# Patient Record
Sex: Female | Born: 1970 | Race: White | Hispanic: No | Marital: Married | Smoking: Never smoker
Health system: Southern US, Community
[De-identification: ages and names within clinical notes are randomized; demographics above are authoritative.]

## PROBLEM LIST (undated history)

## (undated) DIAGNOSIS — Z9221 Personal history of antineoplastic chemotherapy: Secondary | ICD-10-CM

## (undated) DIAGNOSIS — J45909 Unspecified asthma, uncomplicated: Secondary | ICD-10-CM

## (undated) DIAGNOSIS — Z803 Family history of malignant neoplasm of breast: Secondary | ICD-10-CM

## (undated) DIAGNOSIS — Z801 Family history of malignant neoplasm of trachea, bronchus and lung: Secondary | ICD-10-CM

## (undated) DIAGNOSIS — G620 Drug-induced polyneuropathy: Secondary | ICD-10-CM

## (undated) DIAGNOSIS — T451X5A Adverse effect of antineoplastic and immunosuppressive drugs, initial encounter: Secondary | ICD-10-CM

## (undated) DIAGNOSIS — Z923 Personal history of irradiation: Secondary | ICD-10-CM

## (undated) DIAGNOSIS — Z8719 Personal history of other diseases of the digestive system: Secondary | ICD-10-CM

## (undated) HISTORY — PX: BREAST BIOPSY: SHX20

## (undated) HISTORY — PX: WISDOM TOOTH EXTRACTION: SHX21

## (undated) HISTORY — DX: Family history of malignant neoplasm of trachea, bronchus and lung: Z80.1

## (undated) HISTORY — DX: Family history of malignant neoplasm of breast: Z80.3

## (undated) HISTORY — PX: NASAL SINUS SURGERY: SHX719

---

## 1998-05-03 ENCOUNTER — Other Ambulatory Visit: Admission: RE | Admit: 1998-05-03 | Discharge: 1998-05-03 | Payer: Self-pay | Admitting: Obstetrics & Gynecology

## 1998-12-08 ENCOUNTER — Inpatient Hospital Stay (HOSPITAL_COMMUNITY): Admission: AD | Admit: 1998-12-08 | Discharge: 1998-12-10 | Payer: Self-pay | Admitting: Obstetrics & Gynecology

## 1999-01-31 ENCOUNTER — Other Ambulatory Visit: Admission: RE | Admit: 1999-01-31 | Discharge: 1999-01-31 | Payer: Self-pay | Admitting: Obstetrics & Gynecology

## 2000-02-27 ENCOUNTER — Other Ambulatory Visit: Admission: RE | Admit: 2000-02-27 | Discharge: 2000-02-27 | Payer: Self-pay | Admitting: Obstetrics & Gynecology

## 2001-05-19 ENCOUNTER — Other Ambulatory Visit: Admission: RE | Admit: 2001-05-19 | Discharge: 2001-05-19 | Payer: Self-pay | Admitting: Obstetrics & Gynecology

## 2002-07-14 ENCOUNTER — Other Ambulatory Visit: Admission: RE | Admit: 2002-07-14 | Discharge: 2002-07-14 | Payer: Self-pay | Admitting: Obstetrics & Gynecology

## 2003-05-24 ENCOUNTER — Emergency Department (HOSPITAL_COMMUNITY): Admission: AD | Admit: 2003-05-24 | Discharge: 2003-05-24 | Payer: Self-pay | Admitting: Internal Medicine

## 2003-12-20 ENCOUNTER — Other Ambulatory Visit: Admission: RE | Admit: 2003-12-20 | Discharge: 2003-12-20 | Payer: Self-pay | Admitting: Obstetrics & Gynecology

## 2004-06-13 ENCOUNTER — Encounter: Admission: RE | Admit: 2004-06-13 | Discharge: 2004-06-13 | Payer: Self-pay | Admitting: Internal Medicine

## 2005-01-16 ENCOUNTER — Ambulatory Visit: Payer: Self-pay | Admitting: Internal Medicine

## 2005-02-19 ENCOUNTER — Other Ambulatory Visit: Admission: RE | Admit: 2005-02-19 | Discharge: 2005-02-19 | Payer: Self-pay | Admitting: Obstetrics & Gynecology

## 2005-03-06 ENCOUNTER — Ambulatory Visit: Payer: Self-pay | Admitting: Internal Medicine

## 2005-04-17 ENCOUNTER — Ambulatory Visit: Payer: Self-pay | Admitting: Internal Medicine

## 2005-04-24 ENCOUNTER — Ambulatory Visit: Payer: Self-pay | Admitting: Internal Medicine

## 2005-11-24 ENCOUNTER — Ambulatory Visit: Payer: Self-pay | Admitting: Internal Medicine

## 2005-11-26 ENCOUNTER — Ambulatory Visit: Payer: Self-pay | Admitting: Internal Medicine

## 2006-04-01 ENCOUNTER — Ambulatory Visit: Payer: Self-pay | Admitting: Internal Medicine

## 2006-04-28 ENCOUNTER — Ambulatory Visit: Payer: Self-pay | Admitting: Internal Medicine

## 2006-05-06 ENCOUNTER — Ambulatory Visit: Payer: Self-pay | Admitting: Internal Medicine

## 2006-06-23 ENCOUNTER — Ambulatory Visit: Payer: Self-pay | Admitting: Internal Medicine

## 2006-11-04 ENCOUNTER — Ambulatory Visit: Payer: Self-pay | Admitting: Internal Medicine

## 2007-01-04 ENCOUNTER — Ambulatory Visit: Payer: Self-pay | Admitting: Internal Medicine

## 2007-04-22 ENCOUNTER — Encounter: Admission: RE | Admit: 2007-04-22 | Discharge: 2007-04-22 | Payer: Self-pay | Admitting: Obstetrics & Gynecology

## 2007-05-03 ENCOUNTER — Ambulatory Visit: Payer: Self-pay | Admitting: Internal Medicine

## 2007-05-06 ENCOUNTER — Ambulatory Visit: Payer: Self-pay | Admitting: Internal Medicine

## 2007-05-19 ENCOUNTER — Ambulatory Visit: Payer: Self-pay | Admitting: Internal Medicine

## 2007-07-14 HISTORY — PX: NASAL SINUS SURGERY: SHX719

## 2007-09-13 ENCOUNTER — Telehealth (INDEPENDENT_AMBULATORY_CARE_PROVIDER_SITE_OTHER): Payer: Self-pay | Admitting: *Deleted

## 2007-09-22 DIAGNOSIS — J45909 Unspecified asthma, uncomplicated: Secondary | ICD-10-CM | POA: Insufficient documentation

## 2007-09-22 DIAGNOSIS — J330 Polyp of nasal cavity: Secondary | ICD-10-CM | POA: Insufficient documentation

## 2007-09-22 DIAGNOSIS — Z91040 Latex allergy status: Secondary | ICD-10-CM | POA: Insufficient documentation

## 2007-09-22 DIAGNOSIS — J309 Allergic rhinitis, unspecified: Secondary | ICD-10-CM | POA: Insufficient documentation

## 2007-11-03 ENCOUNTER — Ambulatory Visit: Payer: Self-pay | Admitting: Internal Medicine

## 2007-11-18 ENCOUNTER — Ambulatory Visit: Payer: Self-pay | Admitting: Internal Medicine

## 2008-01-09 ENCOUNTER — Telehealth (INDEPENDENT_AMBULATORY_CARE_PROVIDER_SITE_OTHER): Payer: Self-pay | Admitting: *Deleted

## 2008-05-03 ENCOUNTER — Ambulatory Visit: Payer: Self-pay | Admitting: Internal Medicine

## 2008-09-12 ENCOUNTER — Ambulatory Visit: Payer: Self-pay | Admitting: Internal Medicine

## 2008-11-09 ENCOUNTER — Ambulatory Visit: Payer: Self-pay | Admitting: Otolaryngology

## 2008-11-15 ENCOUNTER — Ambulatory Visit: Payer: Self-pay | Admitting: Otolaryngology

## 2010-04-17 ENCOUNTER — Encounter: Admission: RE | Admit: 2010-04-17 | Discharge: 2010-04-17 | Payer: Self-pay | Admitting: Obstetrics & Gynecology

## 2010-08-03 ENCOUNTER — Encounter: Payer: Self-pay | Admitting: Obstetrics & Gynecology

## 2010-08-14 NOTE — Progress Notes (Signed)
Summary: [facility]MT[facility]-pt of town need med  Phone Note Call from Patient Call back at [phone]   Caller: Patient Call For: young Summary of Call: need something for sob need albuterol inhaler sent to pharmacy pharmacy [phone] Initial call taken by: Rickard Patience,  January 09, 2008 10:28 AM  Follow-up for Phone Call        Does CY rx albuterol relief inhaler for pt?  Not on pt's med list.  Attempted to call pt back.  LMTCB. Cloyde Reams RN  January 09, 2008 10:30 AM  Spoke with pt.  States CY has filled albuterol in the past.  Pt does not fill rx regularly, and albuterol inh pt has on hand has expires.  Pt is having trouble taking in a deep breath.  Is this OK to call in? Please advise. Follow-up by: Cloyde Reams RN,  January 09, 2008 11:19 AM  Additional Follow-up for Phone Call Additional follow up Details #1::        Added Proaitr to her med list. Please call it in as requested. Additional Follow-up by: Waymon Budge MD,  January 09, 2008 11:24 AM    Additional Follow-up for Phone Call Additional follow up Details #2::    PROAIR PHONED TO PHARMACY IN [place]-PH# [phone] Follow-up by: Philipp Deputy CMA,  January 09, 2008 12:03 PM  New/Updated Medications: PROAIR HFA 108 (90 BASE) MCG/ACT  AERS (ALBUTEROL SULFATE) 2 puffs four times a day prn   Prescriptions: PROAIR HFA 108 (90 BASE) MCG/ACT  AERS (ALBUTEROL SULFATE) 2 puffs four times a day prn  #1 x prn   Entered and Authorized by:   Waymon Budge MD   Signed by:   Waymon Budge MD on 01/09/2008   Method used:   Historical   RxID:   [id number]

## 2010-08-14 NOTE — Progress Notes (Signed)
Summary: rx req  Medications Added TAMIFLU 75 MG  CAPS (OSELTAMIVIR PHOSPHATE)  ZITHROMAX Z-PAK 250 MG  TABS (AZITHROMYCIN)        Phone Note Call from Patient   Caller: Patient Call For: young Summary of Call: req z pack for her sinus infection. son recently had the flu[facility] [address] Patient's chart has been requested. Initial call taken by: Tivis Ringer,  September 13, 2007 11:21 AM  Follow-up for Phone Call        head congestion, non-productive cough, body aches, headaches, ??? flu - son has flu; daughter just had sinus surgery Z-Pak & Tamiflu?? Follow-up by: Marinus Maw,  September 13, 2007 11:29 AM  Additional Follow-up for Phone Call Additional follow up Details #1::        OK to send  Z-pak, 2 today then one daily. Tamiflu 75 mg, # 10, 1 two times a day.  Additional Follow-up by: Waymon Budge MD,  September 13, 2007 11:55 AM    New/Updated Medications: TAMIFLU 75 MG  CAPS (OSELTAMIVIR PHOSPHATE)  ZITHROMAX Z-PAK 250 MG  TABS (AZITHROMYCIN)    Prescriptions: TAMIFLU 75 MG  CAPS (OSELTAMIVIR PHOSPHATE)   #10 x 0   Entered by:   Marinus Maw   Authorized by:   Waymon Budge MD   Signed by:   Marinus Maw on 09/13/2007   Method used:   [facility] to ...       [facility]  [address]  [id number]       [address]       [place], [place]  [address]       Ph: [phone] or [phone]       Fax: [phone]   RxID:   [id number] ZITHROMAX Z-PAK 250 MG  TABS (AZITHROMYCIN)   #1 x 0   Entered by:   Marinus Maw   Authorized by:   Waymon Budge MD   Signed by:   Marinus Maw on 09/13/2007   Method used:   Telephoned to ...       [facility]  [address]  [id number]       [address]       [place], [place]  [address]       Ph: [phone] or [phone]       Fax: [phone]   RxID:   [id number]

## 2010-08-14 NOTE — Assessment & Plan Note (Signed)
Summary: FU 6 MONTHS///KWP   Visit Type:  Follow-up PCP:  [name]  Chief Complaint:  6 month follow up.  History of Present Illness: Current Problems:  NASAL POLYP (ICD-471.0) PERSONAL HISTORY OF ALLERGY TO LATEX (ICD-V15.07) ASTHMA (ICD-493.90) ALLERGIC RHINITIS (ICD-477.9)  Chelsea Hobbs returns for follow-up for allergic rhinitis and asthma with history of nasal polyps.  She continues allergy vaccine giving her own injections with no problems.  We discussed risk and safety issues again.  She has an EpiPen.  Nasal congestion, comes and goes.  She doesn't have much nasal discharge.  She would like to try Allegra.  She only wheezes with virus infections.  Her 28 year old daughter is having nasal polyp surgery.       Current Allergies (reviewed today): ! DOXYCYCLINE ! * LATEX  Past Medical History:    Reviewed history and no changes required:       NASAL POLYP (ICD-471.0)       PERSONAL HISTORY OF ALLERGY TO LATEX (ICD-V15.07)       ASTHMA (ICD-493.90)       ALLERGIC RHINITIS (ICD-477.9)            Risk Factors:  Tobacco use:  never   Review of Systems      See HPI   Vital Signs:  Patient Profile:   40 Years Old Female Weight:      117 pounds O2 Sat:      100 % O2 treatment:    Room Air Pulse rate:   84 / minute BP sitting:   114 / 74  (left arm) Cuff size:   regular  Vitals Entered By: Boone Master CNA (Nov 18, 2007 8:56 AM)             Is Patient Diabetic? No Comments Medications reviewed with patient Boone Master CNA  Nov 18, 2007 8:56 AM      Physical Exam  General:     is well-developed, well-nourished, and in no distress Eyes:     PERRLA/EOM intact; conjunctiva and sclera clear Ears:     TMs intact and clear with normal canals Nose:     nasal airway is narrow with without obstruction or visible polyps.  The mucosa is pale.  Secretions are minimal. Mouth:     no deformity or lesions Neck:     no masses, thyromegaly, or abnormal  cervical nodes Lungs:     clear bilaterally to auscultation and percussion Heart:     regular rate and rhythm, S1, S2 without murmurs, rubs, gallops, or clicks Cervical Nodes:     no significant adenopathy Axillary Nodes:     no significant adenopathy     Problem # 1:  ALLERGIC RHINITIS (ICD-477.9) she has had fairly good control of her allergic rhinitis.  Allergy vaccine does help.  We will let her try switching from Zyrtec to fexofenadine as an antihistamine The following medications were removed from the medication list:    Astelin 137 Mcg/spray Soln (Azelastine hcl) ..... Spray into both nostrils twice a  day  Her updated medication list for this problem includes:    Zyrtec Allergy 10 Mg Tabs (Cetirizine hcl) .Marland Kitchen... As needed    Fluticasone Propionate 50 Mcg/act Susp (Fluticasone propionate) .Marland Kitchen... 1-2 puffs each nostril daily    Fexofenadine Hcl 180 Mg Tabs (Fexofenadine hcl) .Marland Kitchen... 1 daily as needed for allergy  Orders: Est. Patient Level III ([address])   Problem # 2:  ASTHMA (ICD-493.90) Assessment: Unchanged  Orders: Est. Patient  Level III ([address])   Medications Added to Medication List This Visit: 1)  Allergy Vaccine  .Marland Kitchen.. 1:10 2)  Allergy Vaccine Go 1:10 (w-e)  3)  Nexium 40 Mg Pack (Esomeprazole magnesium) .... As needed 4)  Zyrtec Allergy 10 Mg Tabs (Cetirizine hcl) .... As needed 5)  Veramyst 27.5 Mcg/spray Susp (Fluticasone furoate) .Marland Kitchen.. 1 spray each nostril two times a day 6)  Fluticasone Propionate 50 Mcg/act Susp (Fluticasone propionate) .Marland Kitchen.. 1-2 puffs each nostril daily 7)  Epipen 0.3 Mg/0.26ml (1:1000) Devi (Epinephrine hcl (anaphylaxis)) .... For severe allergic reaction 8)  Fexofenadine Hcl 180 Mg Tabs (Fexofenadine hcl) .Marland Kitchen.. 1 daily as needed for allergy   Patient Instructions: 1)  Please schedule a follow-up appointment in 6 months. 2)  continue vaccine at 1:10 3)  Try fexofenadine as an antihistamine 4)  Consider adding Sudafed-PE (OTC)  as a  decongestant as needed   Prescriptions: FEXOFENADINE HCL 180 MG  TABS (FEXOFENADINE HCL) 1 daily as needed for allergy  #30 x prn   Entered and Authorized by:   Waymon Budge MD   Signed by:   Waymon Budge MD on 11/18/2007   Method used:   Electronically sent to ...       [facility]  [address]  [id number]       [address]       [place], [place]  [address]       Ph: [phone] or [phone]       Fax: [phone]   RxID:   [id number] FLUTICASONE PROPIONATE 50 MCG/ACT  SUSP (FLUTICASONE PROPIONATE) 1-2 puffs each nostril daily  #1 x prn   Entered and Authorized by:   Waymon Budge MD   Signed by:   Waymon Budge MD on 11/18/2007   Method used:   Historical   RxID:   [id number] EPIPEN 0.3 MG/0.3ML (1:1000)  DEVI (EPINEPHRINE HCL (ANAPHYLAXIS)) For severe allergic reaction  #1 x prn   Entered and Authorized by:   Waymon Budge MD   Signed by:   Waymon Budge MD on 11/18/2007   Method used:   Electronically sent to ...       [facility]  [address]  [id number]       [address]       [place], [place]  [address]       Ph: [phone] or [phone]       Fax: [phone]   RxID:   [id number]  ]

## 2010-11-25 NOTE — Assessment & Plan Note (Signed)
[facility]                             PULMONARY OFFICE NOTE   RANESHIA, DERICK                      MRN:          [id number]  DATE:05/19/2007                            DOB:          1971/07/07    PROBLEMS:  1. Allergic rhinitis.  2. Asthma.  3. Latex dermatitis.  4. Nasal polyp.   HISTORY:  He is doing well. There is no nasal congestion this fall. He  uses Zyrtec now and has discovered that using saline lavage with a neti  pot works great when needed. Allergy vaccine definitely helps and she  continues without problems. This was reviewed again. She has had flu  shot.   MEDICATIONS:  1. Allergy vaccine.  2. Astelin p.r.n.  3. Nexium p.r.n.  4. Lyalda 1.2 grams p.r.n.  5. Zyrtec.  6. Nasal fluticasone spray.   Drug intolerant of LATEX. DOXYCYCLINE caused diarrhea.   OBJECTIVE:  Weight 115 pounds, blood pressure 112/78, pulse regular 71,  room air saturation 98%. Conjunctivae are clear. There is a prominent  pale superior turbinate on the left or possibly a polyp, non-occlusive.  Her pharynx is clear. Lungs are clear. Heart sounds are normal.   IMPRESSION:  Allergic rhinitis with history of nasal polyps and  questionable polyp on the left.   PLAN:  She will continue fluticasone nasal spray which was discussed.  Continue vaccine. Schedule return 6 months, earlier p.r.n.     Clinton D. Maple Hudson, MD, [facility], FACP  Electronically Signed    CDY/MedQ  DD: 05/21/2007  DT: 05/22/2007  Job #: [id number]   cc:   Georgann Housekeeper, MD

## 2010-11-28 NOTE — Assessment & Plan Note (Signed)
[facility]                             PULMONARY OFFICE NOTE   AKSHITHA, CULMER                      MRN:          [id number]  DATE:11/04/2006                            DOB:          April 07, 1971    PROBLEMS:  1. Allergic rhinitis.  2. Asthma.  3. Latex dermatitis.  4. Nasal polyp.   HISTORY:  A six month followup.  Minor nasal stuffiness, which she is  willing to put up with as long as she can use Zyrtec.  She quit  Rhinocort mainly because it was too expensive for her.  She continues  allergy vaccine without problems and does have an EpiPen.  She has not  had wheezing.   MEDICATIONS:  1. Allergy vaccine.  2. Generic Allegra 60 mg, being changed to Zyrtec OTC.  3. Nexium.  4. Lialda 1.2 gm.   Drug-intolerant to DOXYCYCLINE with diarrhea.  Contact with LASIX.   OBJECTIVE:  VITAL SIGNS:  Weight 115 pounds.  BP 122/78, pulse regular  at 71.  Room air saturation 98%.  HEENT:  There is a polyp on the upper aspect of the left naris, but she  seems able to breathe comfortably through both nostrils.  Pharynx is  clear.  Conjunctivae are not injected.  LUNGS:  Clear.   IMPRESSION:  Nasal polyposis, allergic rhinitis, minimal asthma.   PLAN:  1. Fluticasone nasal spray to replace Rhinocort once each nostril      daily.  2. Continue allergy vaccine.  3. Schedule return in six months, earlier p.r.n.     Clinton D. Maple Hudson, MD, [facility], FACP  Electronically Signed    CDY/MedQ  DD: 11/04/2006  DT: 11/05/2006  Job #: [id number]   cc:   Georgann Housekeeper, MD

## 2010-11-28 NOTE — Assessment & Plan Note (Signed)
[facility]                               PULMONARY OFFICE NOTE   Chelsea, Hobbs                      MRN:          [id number]  DATE:05/06/2006                            DOB:          1970/09/01    PROBLEM LIST:  1. Allergic rhinitis.  2. Asthma.  3. Latex dermatitis.  4. Question nasal polyp.   HISTORY OF PRESENT ILLNESS:  She had seen the nurse practitioner on  September 20, complaining of some facial irritation with mild lip swelling  and tingling after beginning a new skin care product.  She had also started  on a new colitis medication called Lialda.  She stopped the colitis  medication for observation and restarted it with no recurrent problems.  It  is still not clear whether she was having a reaction to her cosmetics, but  those have been discontinued and the rash has not returned.  She still  notices some nasal congestion lingering after an obvious cold 2 weeks ago  and when she bends over, she feels uncomfortable, retro-orbital pressure,  not really a headache.  Her chest and ears are okay.   MEDICATIONS:  Allergy vaccine continues it 1/10 with no problems.  She has  had flu vaccine.  Allegra 60 mg b.i.d. p.r.n., Astelin p.r.n., Nexium,  Rhinocort, Lialda 1.2 mg.   OBJECTIVE:  VITAL SIGNS:  Weight 116 pounds, BP 124/70, pulse regular at 76  per minute, room air saturation 100%.  HEENT:  There is moderate nasal congestion, no periorbital edema, mild  turbinate edema is noted with only clear secretion.  No postnasal drainage  or pharyngeal erythema.  Tympanic membranes seem okay.  I do not find  adenopathy.  LUNGS:  Clear.   IMPRESSION:  Residual rhinitis with mucosal congestion, possible mild  sinusitis, otherwise stable.   PLAN:  1. Nasal nebulizer treatment with Neo-Synephrine.  2. Depo-Medrol 80 mg IM with steroid talk.  3. Entex PSE one b.i.d. p.r.n. congestion.  4. Continue allergy vaccine at 1/10.  5. Schedule  return in 6 months, earlier p.r.n.     Clinton D. Maple Hudson, MD, [facility], FACP   CDY/MedQ  DD: 05/08/2006  DT: 05/10/2006  Job #: [id number]   cc:   Georgann Housekeeper, MD

## 2010-11-28 NOTE — Assessment & Plan Note (Signed)
[facility]                               PULMONARY OFFICE NOTE   Chelsea Hobbs, SLAPPEY                      MRN:          [id number]  DATE:04/01/2006                            DOB:          08-12-1970    HISTORY OF PRESENT ILLNESS:  The patient is a very pleasant female patient  of Dr. Maple Hudson who has a known history of allergic rhinitis, asthma, and latex  dermatitis who presents for an acute office visit.  The patient complains  over the last 4 days she has had an irritation along her face with some mild  lip swelling and tingling.  The patient reports she recently started using a  new facial care product from [facility] and subsequently woke up the next  morning with irritation along the face with scaling of the skin and  puffiness.  The patient did discontinue this product and then subsequently  used another facial product from [facility] and had continued irritation.  She  changed to another [employer] skin product with persistent irritation.  NOTE:  The patient did also start on a new medication for her colitis called Lialda  1.2 gm daily.  The patient had previously been on this medication for an  extended amount of time, however, it was through suppositories and now she  is on the oral tablet form.  The patient does report she has a mild chest  tightness and wheezing for which she took her albuterol inhaler with total  resolution.  This morning the patient reports she has had no cough,  wheezing, difficulty swallowing, or fever.  She does complain of some slight  facial puffiness, especially around the lower lip.   PAST MEDICAL HISTORY:  Reviewed.   CURRENT MEDICATIONS:  Reviewed.   ON PHYSICAL EXAMINATION:  GENERAL:  The patient is a pleasant female in no  acute distress.  She is afebrile with stable vital signs.  HEENT:  Posterior pharynx is clear.  No exudate or swelling is noted.  TMs  are normal.  Conjunctivae noninjected.  Lips appear  somewhat puffy; however,  no significant swelling is noted.  The patient does have some dry skin along  the outer edge of the lower lip and chin area.  No rash or urticarial  reaction is noted.  NECK:  Supple without cervical adenopathy.  No JVD.  LUNG SOUNDS:  Clear without any wheezing or crackles.  CARDIAC:  Regular rate and rhythm.  ABDOMEN:  Soft and benign.  EXTREMITIES:  Warm without any calf cyanosis, clubbing, or edema.   IMPRESSION/PLAN:  Skin reaction, questionable etiology.  The patient does  appear to be intolerant of the new facial cream products and has been  recommended to discontinue these.  Unfortunately these were also started  around the same time her new medication Lialda, so I have instructed her to  stop this medication at least for the next week and contact her  gastroenterologist and let him know that she is stopping this medication in  case she is developing a reaction and to determine if in the next week we  should restart this once her skin has cleared up.  In the meantime, the  patient will increase her Allegra up to 180 mg for 3 days.  Stop her topical  cream products, use Dove soap, and Eucerin cream daily.  The patient was  given a Xopenex nebulizer treatment.  The patient is on allergy vaccines  1:50 once a week.  I have reminded the patient that she does also have an  EpiPen in case of reaction during her allergy vaccines.  The patient is to  return here in two weeks with Dr. Maple Hudson or sooner if needed.  The patient  has been advised if her symptoms persist, do not improve, or worsen, she is  to contact us immediately or go to the closest emergency department.                                   Rubye Oaks, NP                                Clinton D. Maple Hudson, MD, [facility], FACP   TP/MedQ  DD:  04/01/2006  DT:  04/03/2006  Job #:  [id number]

## 2011-08-13 ENCOUNTER — Other Ambulatory Visit: Payer: Self-pay | Admitting: Obstetrics & Gynecology

## 2011-08-13 DIAGNOSIS — Z1231 Encounter for screening mammogram for malignant neoplasm of breast: Secondary | ICD-10-CM

## 2011-08-28 ENCOUNTER — Ambulatory Visit: Payer: Self-pay

## 2011-09-11 ENCOUNTER — Ambulatory Visit
Admission: RE | Admit: 2011-09-11 | Discharge: 2011-09-11 | Disposition: A | Discharge status: Home / Self Care | Payer: BC Managed Care – PPO | Source: Ambulatory Visit | Attending: Obstetrics & Gynecology | Admitting: Obstetrics & Gynecology

## 2011-09-11 DIAGNOSIS — Z1231 Encounter for screening mammogram for malignant neoplasm of breast: Secondary | ICD-10-CM

## 2012-02-15 ENCOUNTER — Ambulatory Visit: Payer: Self-pay | Admitting: Otolaryngology

## 2012-05-09 ENCOUNTER — Emergency Department (HOSPITAL_COMMUNITY)
Admission: EM | Admit: 2012-05-09 | Discharge: 2012-05-09 | Disposition: A | Discharge status: Home / Self Care | Payer: BC Managed Care – PPO | Attending: Emergency Medicine | Admitting: Emergency Medicine

## 2012-05-09 ENCOUNTER — Emergency Department (HOSPITAL_COMMUNITY): Payer: BC Managed Care – PPO

## 2012-05-09 ENCOUNTER — Encounter (HOSPITAL_COMMUNITY): Payer: Self-pay | Admitting: Emergency Medicine

## 2012-05-09 DIAGNOSIS — N200 Calculus of kidney: Secondary | ICD-10-CM | POA: Insufficient documentation

## 2012-05-09 DIAGNOSIS — J45909 Unspecified asthma, uncomplicated: Secondary | ICD-10-CM | POA: Insufficient documentation

## 2012-05-09 DIAGNOSIS — Z79899 Other long term (current) drug therapy: Secondary | ICD-10-CM | POA: Insufficient documentation

## 2012-05-09 DIAGNOSIS — Z791 Long term (current) use of non-steroidal anti-inflammatories (NSAID): Secondary | ICD-10-CM | POA: Insufficient documentation

## 2012-05-09 HISTORY — DX: Unspecified asthma, uncomplicated: J45.909

## 2012-05-09 LAB — URINALYSIS, ROUTINE W REFLEX MICROSCOPIC
Bilirubin Urine: NEGATIVE
Glucose, UA: NEGATIVE mg/dL
Hgb urine dipstick: NEGATIVE
Ketones, ur: NEGATIVE mg/dL
Leukocytes, UA: NEGATIVE
Nitrite: NEGATIVE
Protein, ur: 30 mg/dL — AB
Specific Gravity, Urine: 1.024 (ref 1.005–1.030)
Urobilinogen, UA: 0.2 mg/dL (ref 0.0–1.0)
pH: 8.5 — ABNORMAL HIGH (ref 5.0–8.0)

## 2012-05-09 LAB — CBC
HCT: 35.5 % — ABNORMAL LOW (ref 36.0–46.0)
Hemoglobin: 12.5 g/dL (ref 12.0–15.0)
MCH: 31.4 pg (ref 26.0–34.0)
MCHC: 35.2 g/dL (ref 30.0–36.0)
MCV: 89.2 fL (ref 78.0–100.0)
Platelets: 322 10*3/uL (ref 150–400)
RBC: 3.98 MIL/uL (ref 3.87–5.11)
RDW: 12.2 % (ref 11.5–15.5)
WBC: 13.2 10*3/uL — ABNORMAL HIGH (ref 4.0–10.5)

## 2012-05-09 LAB — BASIC METABOLIC PANEL
BUN: 18 mg/dL (ref 6–23)
CO2: 26 mEq/L (ref 19–32)
Calcium: 9.1 mg/dL (ref 8.4–10.5)
Chloride: 102 mEq/L (ref 96–112)
Creatinine, Ser: 0.69 mg/dL (ref 0.50–1.10)
GFR calc Af Amer: >90 mL/min (ref >90)
GFR calc non Af Amer: >90 mL/min (ref >90)
Glucose, Bld: 168 mg/dL — ABNORMAL HIGH (ref 70–99)
Potassium: 3.5 mEq/L (ref 3.5–5.1)
Sodium: 138 mEq/L (ref 135–145)

## 2012-05-09 LAB — WET PREP, GENITAL
Trich, Wet Prep: NONE SEEN
WBC, Wet Prep HPF POC: NONE SEEN
Yeast Wet Prep HPF POC: NONE SEEN

## 2012-05-09 LAB — PREGNANCY, URINE: Preg Test, Ur: NEGATIVE

## 2012-05-09 LAB — URINE MICROSCOPIC-ADD ON

## 2012-05-09 MED ORDER — MORPHINE SULFATE 4 MG/ML IJ SOLN
4.0000 mg | Freq: Once | INTRAMUSCULAR | Status: AC
  Administered 2012-05-09: 4 mg via INTRAVENOUS
  Filled 2012-05-09: qty 1

## 2012-05-09 MED ORDER — OXYCODONE-ACETAMINOPHEN 5-325 MG PO TABS
1.0000 | ORAL_TABLET | Freq: Once | ORAL | Status: AC
  Administered 2012-05-09: 1 via ORAL
  Filled 2012-05-09: qty 1

## 2012-05-09 MED ORDER — ONDANSETRON HCL 4 MG/2ML IJ SOLN
4.0000 mg | Freq: Once | INTRAMUSCULAR | Status: AC
  Administered 2012-05-09: 4 mg via INTRAVENOUS

## 2012-05-09 MED ORDER — IOHEXOL 300 MG/ML  SOLN
20.0000 mL | INTRAMUSCULAR | Status: AC
  Administered 2012-05-09: 20 mL via ORAL

## 2012-05-09 MED ORDER — PERCOCET 5-325 MG PO TABS: 1.0000 | ORAL_TABLET | Freq: Four times a day (QID) | ORAL | Status: DC | PRN

## 2012-05-09 MED ORDER — ONDANSETRON 4 MG PO TBDP
8.0000 mg | ORAL_TABLET | Freq: Once | ORAL | Status: AC
  Administered 2012-05-09: 8 mg via ORAL
  Filled 2012-05-09: qty 2

## 2012-05-09 MED ORDER — ONDANSETRON HCL 4 MG/2ML IJ SOLN
INTRAMUSCULAR | Status: AC
  Administered 2012-05-09: 4 mg via INTRAVENOUS
  Filled 2012-05-09: qty 2

## 2012-05-09 MED ORDER — NAPROXEN 375 MG PO TABS: 375.0000 mg | ORAL_TABLET | Freq: Two times a day (BID) | ORAL | Status: DC

## 2012-05-09 NOTE — ED Notes (Signed)
Pt stated that she started having lower left side abd pain at 0730 this morning. Denies pain radiating to back or leg. Pt stated that she has an IUD.

## 2012-05-09 NOTE — ED Notes (Signed)
Patient transported to CT 

## 2012-05-09 NOTE — ED Notes (Signed)
Patient transported to Ultrasound 

## 2012-05-09 NOTE — ED Provider Notes (Signed)
Medical screening examination/treatment/procedure(s) were performed by non-physician practitioner and as supervising physician I was immediately available for consultation/collaboration.    Nelia Shi, MD 05/09/12 [phone]

## 2012-05-09 NOTE — ED Notes (Signed)
Pt ambulate to bath, steady gate noted.

## 2012-05-09 NOTE — ED Notes (Signed)
Pt up to the bathroom with assistance. When back to the room pt with active vomiting. Zofran ordered and given.

## 2012-05-09 NOTE — ED Provider Notes (Signed)
History     CSN: [id number]  Arrival date & time 05/09/12  1002   First MD Initiated Contact with Patient 05/09/12 1010      Chief Complaint  Patient presents with  . Abdominal Pain    (Consider location/radiation/quality/duration/timing/severity/associated sxs/prior treatment) HPI Comments: Patient is a 41 year old female with an IUD placed that presents emergency department with a chief complaint of left lower quadrant abdominal pain.  Onset of symptoms began at 730 this morning and was described as a pressure type sensation with urination.  Patient denied pain radiation, hematuria, dysuria, or urinary frequency.  Associated symptoms include nausea and lightheadedness from pain.  As the day has progressed the discomfort has worsened turning into pain with a severity of 10/10.  Patient denies any fevers, diaphoresis, chills, trauma, vaginal discharge, emesis, melena, hematochezia, change in BM or weight loss. LNBM yesterday. No known familial breast or ovarian cancer.  The history is provided by the patient.    Past Medical History  Diagnosis Date  . Asthma     Past Surgical History  Procedure Date  . Nasal sinus surgery     No family history on file.  History  Substance Use Topics  . Smoking status: Never Smoker   . Smokeless tobacco: Not on file  . Alcohol Use: Yes     occasionally     OB History    Grav Para Term Preterm Abortions TAB SAB Ect Mult Living                  Review of Systems  Allergies  Doxycycline and Latex  Home Medications   Current Outpatient Rx  Name Route Sig Dispense Refill  . ALBUTEROL SULFATE HFA 108 (90 BASE) MCG/ACT IN AERS Inhalation Inhale 2 puffs into the lungs every 6 (six) hours as needed. For shortness of breath    . VITAMIN-B COMPLEX PO TABS Oral Take 1 tablet by mouth daily.    Marland Kitchen FEXOFENADINE-PSEUDOEPHED ER 60-120 MG PO TB12 Oral Take 1 tablet by mouth daily as needed.    . IBUPROFEN 200 MG PO TABS Oral Take 400 mg by  mouth every 6 (six) hours as needed. For headaches    . ADULT MULTIVITAMIN W/MINERALS CH Oral Take 1 tablet by mouth daily.    Marland Kitchen FISH OIL PO Oral Take 1 tablet by mouth daily.      BP 111/70  Pulse 62  Temp 97 F (36.1 C)  Resp 14  SpO2 100%  Physical Exam  Nursing note and vitals reviewed. Constitutional: Vital signs are normal. She appears well-developed and well-nourished. No distress.  HENT:  Head: Normocephalic and atraumatic.  Mouth/Throat: Uvula is midline, oropharynx is clear and moist and mucous membranes are normal.  Eyes: Conjunctivae normal and EOM are normal. Pupils are equal, round, and reactive to light.  Neck: Normal range of motion and full passive range of motion without pain. Neck supple. No spinous process tenderness and no muscular tenderness present. No rigidity. No Brudzinski's sign noted.  Cardiovascular: Normal rate and regular rhythm.   Pulmonary/Chest: Effort normal and breath sounds normal. No accessory muscle usage. Not tachypneic. No respiratory distress.  Abdominal: Soft. Normal appearance. She exhibits no distension, no ascites, no pulsatile midline mass and no mass. There is tenderness. There is no CVA tenderness. No hernia.       LLQ abdominal pain & left CVA tenderness  Lymphadenopathy:    She has no cervical adenopathy.  Neurological: She is alert.  Skin: Skin  is warm and dry. No rash noted. She is not diaphoretic.  Psychiatric: She has a normal mood and affect. Her speech is normal and behavior is normal.  Exam performed by Jaci Carrel,  exam chaperoned Date: 05/09/2012 Pelvic exam: normal external genitalia without evidence of trauma. VULVA: normal appearing vulva with no masses, tenderness or lesion. VAGINA: normal appearing vagina with normal color and discharge, no lesions. CERVIX: normal appearing cervix without lesions, cervical motion tenderness absent, cervical os closed, but white discharge is present- unable to see IUD strings; vaginal  discharge - white, Wet prep and DNA probe for chlamydia and GC obtained.   ADNEXA: normal adnexa in size, no masses, left sided ttp    ED Course  Procedures (including critical care time)  Labs Reviewed  URINALYSIS, ROUTINE W REFLEX MICROSCOPIC - Abnormal; Notable for the following:    APPearance CLOUDY (*)     pH 8.5 (*)     Protein, ur 30 (*)     All other components within normal limits  WET PREP, GENITAL - Abnormal; Notable for the following:    Clue Cells Wet Prep HPF POC FEW (*)     All other components within normal limits  CBC - Abnormal; Notable for the following:    WBC 13.2 (*)     HCT 35.5 (*)     All other components within normal limits  BASIC METABOLIC PANEL - Abnormal; Notable for the following:    Glucose, Bld 168 (*)     All other components within normal limits  PREGNANCY, URINE  URINE MICROSCOPIC-ADD ON  GC/CHLAMYDIA PROBE AMP, GENITAL   Ct Abdomen Pelvis Wo Contrast  05/09/2012  *RADIOLOGY REPORT*  Clinical Data: Left lower abdomen pain since 7;30 this morning. Dysuria.  CT ABDOMEN AND PELVIS WITHOUT CONTRAST  Technique:  Multidetector CT imaging of the abdomen and pelvis was performed following the standard protocol without intravenous contrast.  Comparison: Normal pelvic ultrasound performed earlier today.  Findings: Mild fullness left collecting system.  Bilateral nephrolithiasis, with greater stone burden on the left.  No clear- cut left ureteral calculus although difficult to completely exclude a 2 mm stone left UVJ region (image 69 series 2). No visible renal mass or perinephric fluid collection.  IUD in good position.  No free pelvic fluid or bowel obstruction. No evidence for free air.  Except for the kidneys, otherwise normal unenhanced appearance to the abdominal viscera.  Negative lumbar spine and bony pelvis. Clear lung bases with normal heart size. Normal appearing retroperitoneal structures.  IMPRESSION: Bilateral nephrolithiasis with mild fullness of  the left renal collecting system.  Cannot completely exclude a 2 mm stone left UVJ region.  No free pelvic fluid or pelvic inflammatory process.  No evidence for bowel obstruction.   Original Report Authenticated By: Elsie Stain, M.D.    US Transvaginal Non-ob  05/09/2012  *RADIOLOGY REPORT*  Clinical Data: Left lower quadrant pain.  TRANSABDOMINAL AND TRANSVAGINAL ULTRASOUND OF PELVIS Technique:  Both transabdominal and transvaginal ultrasound examinations of the pelvis were performed. Transabdominal technique was performed for global imaging of the pelvis including uterus, ovaries, adnexal regions, and pelvic cul-de-sac.  It was necessary to proceed with endovaginal exam following the transabdominal exam to visualize the endometrial, ovaries and adnexal regions.  Comparison:  None.  Findings:  Uterus: Measures 5.5 x 3.2 x 4.2 cm.  Parenchymal echogenicity is normal.  Endometrium: Measures 2 mm.  Echogenic intrauterine contraceptive device is noted.  Right ovary:  Measures 3.6 x 1.8  x 2.4 cm, negative.  Left ovary: Measures 3.0 x 1.6 x 2.5 cm, negative.  Other findings: No free fluid.  IMPRESSION: Normal study. No evidence of pelvic mass or other significant abnormality.  IUD in place.   Original Report Authenticated By: Reyes Ivan, M.D.    US Pelvis Complete  05/09/2012  *RADIOLOGY REPORT*  Clinical Data: Left lower quadrant pain.  TRANSABDOMINAL AND TRANSVAGINAL ULTRASOUND OF PELVIS Technique:  Both transabdominal and transvaginal ultrasound examinations of the pelvis were performed. Transabdominal technique was performed for global imaging of the pelvis including uterus, ovaries, adnexal regions, and pelvic cul-de-sac.  It was necessary to proceed with endovaginal exam following the transabdominal exam to visualize the endometrial, ovaries and adnexal regions.  Comparison:  None.  Findings:  Uterus: Measures 5.5 x 3.2 x 4.2 cm.  Parenchymal echogenicity is normal.  Endometrium: Measures 2 mm.   Echogenic intrauterine contraceptive device is noted.  Right ovary:  Measures 3.6 x 1.8 x 2.4 cm, negative.  Left ovary: Measures 3.0 x 1.6 x 2.5 cm, negative.  Other findings: No free fluid.  IMPRESSION: Normal study. No evidence of pelvic mass or other significant abnormality.  IUD in place.   Original Report Authenticated By: Reyes Ivan, M.D.      No diagnosis found.    MDM  LLQ & left flank pain, likely kidney stones  Pt has been diagnosed with a Kidney Stone via CT and urine. IUD in place, no UTI & non concern for ovarian abscess or torsion. There is no evidence of significant hydronephrosis, serum creatine WNL, vitals sign stable and the pt does not have irratractable vomiting. Pt will be dc home with pain medications & has been advised to follow up with PCP.          Jaci Carrel, [place] 05/09/12 1446

## 2012-05-10 LAB — GC/CHLAMYDIA PROBE AMP, GENITAL
Chlamydia, DNA Probe: NEGATIVE
GC Probe Amp, Genital: NEGATIVE

## 2012-06-29 ENCOUNTER — Other Ambulatory Visit: Payer: Self-pay | Admitting: Gastroenterology

## 2012-06-29 DIAGNOSIS — K310 Acute dilatation of stomach: Secondary | ICD-10-CM

## 2012-07-08 ENCOUNTER — Ambulatory Visit
Admission: RE | Admit: 2012-07-08 | Discharge: 2012-07-08 | Disposition: A | Discharge status: Home / Self Care | Payer: BC Managed Care – PPO | Source: Ambulatory Visit | Attending: Gastroenterology | Admitting: Gastroenterology

## 2012-07-08 DIAGNOSIS — K310 Acute dilatation of stomach: Secondary | ICD-10-CM

## 2012-11-04 ENCOUNTER — Other Ambulatory Visit: Payer: Self-pay

## 2012-11-04 DIAGNOSIS — Z1231 Encounter for screening mammogram for malignant neoplasm of breast: Secondary | ICD-10-CM

## 2012-11-18 ENCOUNTER — Ambulatory Visit: Payer: BC Managed Care – PPO

## 2013-05-03 ENCOUNTER — Ambulatory Visit: Payer: BC Managed Care – PPO

## 2013-05-31 ENCOUNTER — Ambulatory Visit
Admission: RE | Admit: 2013-05-31 | Discharge: 2013-05-31 | Disposition: A | Discharge status: Home / Self Care | Payer: BC Managed Care – PPO | Source: Ambulatory Visit

## 2013-05-31 DIAGNOSIS — Z1231 Encounter for screening mammogram for malignant neoplasm of breast: Secondary | ICD-10-CM

## 2013-07-29 ENCOUNTER — Emergency Department (HOSPITAL_COMMUNITY)
Admission: EM | Admit: 2013-07-29 | Discharge: 2013-07-29 | Disposition: A | Discharge status: Home / Self Care | Payer: BC Managed Care – PPO | Attending: Emergency Medicine | Admitting: Emergency Medicine

## 2013-07-29 ENCOUNTER — Encounter (HOSPITAL_COMMUNITY): Payer: Self-pay | Admitting: Emergency Medicine

## 2013-07-29 ENCOUNTER — Emergency Department (HOSPITAL_COMMUNITY): Payer: BC Managed Care – PPO

## 2013-07-29 DIAGNOSIS — N201 Calculus of ureter: Secondary | ICD-10-CM

## 2013-07-29 DIAGNOSIS — J45909 Unspecified asthma, uncomplicated: Secondary | ICD-10-CM | POA: Insufficient documentation

## 2013-07-29 DIAGNOSIS — N132 Hydronephrosis with renal and ureteral calculous obstruction: Secondary | ICD-10-CM

## 2013-07-29 DIAGNOSIS — N1330 Unspecified hydronephrosis: Secondary | ICD-10-CM | POA: Insufficient documentation

## 2013-07-29 DIAGNOSIS — Z91040 Latex allergy status: Secondary | ICD-10-CM | POA: Insufficient documentation

## 2013-07-29 DIAGNOSIS — Z3202 Encounter for pregnancy test, result negative: Secondary | ICD-10-CM | POA: Insufficient documentation

## 2013-07-29 DIAGNOSIS — Z79899 Other long term (current) drug therapy: Secondary | ICD-10-CM | POA: Insufficient documentation

## 2013-07-29 LAB — COMPREHENSIVE METABOLIC PANEL
ALT: 25 U/L (ref 0–35)
AST: 18 U/L (ref 0–37)
Albumin: 3.9 g/dL (ref 3.5–5.2)
Alkaline Phosphatase: 52 U/L (ref 39–117)
BUN: 10 mg/dL (ref 6–23)
CO2: 24 mEq/L (ref 19–32)
Calcium: 8.9 mg/dL (ref 8.4–10.5)
Chloride: 95 mEq/L — ABNORMAL LOW (ref 96–112)
Creatinine, Ser: 0.64 mg/dL (ref 0.50–1.10)
GFR calc Af Amer: >90 mL/min (ref >90)
GFR calc non Af Amer: >90 mL/min (ref >90)
Glucose, Bld: 116 mg/dL — ABNORMAL HIGH (ref 70–99)
Potassium: 3.9 mEq/L (ref 3.7–5.3)
Sodium: 133 mEq/L — ABNORMAL LOW (ref 137–147)
Total Bilirubin: 0.6 mg/dL (ref 0.3–1.2)
Total Protein: 6.8 g/dL (ref 6.0–8.3)

## 2013-07-29 LAB — CBC WITH DIFFERENTIAL/PLATELET
Basophils Absolute: 0 10*3/uL (ref 0.0–0.1)
Basophils Relative: 0 % (ref 0–1)
Eosinophils Absolute: 0.3 10*3/uL (ref 0.0–0.7)
Eosinophils Relative: 3 % (ref 0–5)
HCT: 36.6 % (ref 36.0–46.0)
Hemoglobin: 12.9 g/dL (ref 12.0–15.0)
Lymphocytes Relative: 16 % (ref 12–46)
Lymphs Abs: 2.1 10*3/uL (ref 0.7–4.0)
MCH: 31.5 pg (ref 26.0–34.0)
MCHC: 35.2 g/dL (ref 30.0–36.0)
MCV: 89.5 fL (ref 78.0–100.0)
Monocytes Absolute: 0.8 10*3/uL (ref 0.1–1.0)
Monocytes Relative: 7 % (ref 3–12)
Neutro Abs: 9.3 10*3/uL — ABNORMAL HIGH (ref 1.7–7.7)
Neutrophils Relative %: 74 % (ref 43–77)
Platelets: 396 10*3/uL (ref 150–400)
RBC: 4.09 MIL/uL (ref 3.87–5.11)
RDW: 12.5 % (ref 11.5–15.5)
WBC: 12.5 10*3/uL — ABNORMAL HIGH (ref 4.0–10.5)

## 2013-07-29 LAB — URINALYSIS, ROUTINE W REFLEX MICROSCOPIC
Bilirubin Urine: NEGATIVE
Glucose, UA: NEGATIVE mg/dL
Hgb urine dipstick: NEGATIVE
Ketones, ur: NEGATIVE mg/dL
Leukocytes, UA: NEGATIVE
Nitrite: NEGATIVE
Protein, ur: NEGATIVE mg/dL
Specific Gravity, Urine: 1.01 (ref 1.005–1.030)
Urobilinogen, UA: 0.2 mg/dL (ref 0.0–1.0)
pH: 8 (ref 5.0–8.0)

## 2013-07-29 LAB — LIPASE, BLOOD: Lipase: 28 U/L (ref 11–59)

## 2013-07-29 LAB — POCT PREGNANCY, URINE: Preg Test, Ur: NEGATIVE

## 2013-07-29 MED ORDER — MORPHINE SULFATE 4 MG/ML IJ SOLN
4.0000 mg | Freq: Once | INTRAMUSCULAR | Status: AC
  Administered 2013-07-29: 4 mg via INTRAVENOUS
  Filled 2013-07-29: qty 1

## 2013-07-29 MED ORDER — ONDANSETRON 4 MG PO TBDP
8.0000 mg | ORAL_TABLET | Freq: Once | ORAL | Status: AC
  Administered 2013-07-29: 8 mg via ORAL
  Filled 2013-07-29: qty 2

## 2013-07-29 MED ORDER — TAMSULOSIN HCL 0.4 MG PO CAPS
0.4000 mg | ORAL_CAPSULE | Freq: Once | ORAL | Status: AC
  Administered 2013-07-29: 0.4 mg via ORAL
  Filled 2013-07-29: qty 1

## 2013-07-29 MED ORDER — IOHEXOL 300 MG/ML  SOLN
100.0000 mL | Freq: Once | INTRAMUSCULAR | Status: AC | PRN
  Administered 2013-07-29: 100 mL via INTRAVENOUS

## 2013-07-29 MED ORDER — KETOROLAC TROMETHAMINE 10 MG PO TABS: 10.0000 mg | ORAL_TABLET | Freq: Four times a day (QID) | ORAL | Status: DC

## 2013-07-29 MED ORDER — TAMSULOSIN HCL 0.4 MG PO CAPS: ORAL_CAPSULE | ORAL | Status: DC

## 2013-07-29 MED ORDER — ONDANSETRON 4 MG PO TBDP: 4.0000 mg | ORAL_TABLET | Freq: Three times a day (TID) | ORAL | Status: DC | PRN

## 2013-07-29 MED ORDER — KETOROLAC TROMETHAMINE 30 MG/ML IJ SOLN
30.0000 mg | Freq: Once | INTRAMUSCULAR | Status: AC
  Administered 2013-07-29: 30 mg via INTRAVENOUS
  Filled 2013-07-29: qty 1

## 2013-07-29 MED ORDER — OXYCODONE-ACETAMINOPHEN 5-325 MG PO TABS
ORAL_TABLET | ORAL | Status: DC
Start: 2013-07-29 — End: 2019-03-28

## 2013-07-29 MED ORDER — IOHEXOL 300 MG/ML  SOLN: 25.0000 mL | INTRAMUSCULAR | Status: AC

## 2013-07-29 MED ORDER — SODIUM CHLORIDE 0.9 % IV BOLUS (SEPSIS)
1000.0000 mL | Freq: Once | INTRAVENOUS | Status: AC
  Administered 2013-07-29: 1000 mL via INTRAVENOUS

## 2013-07-29 MED ORDER — ONDANSETRON HCL 4 MG/2ML IJ SOLN
4.0000 mg | Freq: Once | INTRAMUSCULAR | Status: AC
  Administered 2013-07-29: 4 mg via INTRAVENOUS
  Filled 2013-07-29: qty 2

## 2013-07-29 NOTE — ED Notes (Signed)
Pt states that 1500 yesterday she started experiencing cramping. States the pain then went away and came back last night and progressively got worse.

## 2013-07-29 NOTE — ED Provider Notes (Signed)
CSN: [id number]     Arrival date & time 07/29/13  0530 History   First MD Initiated Contact with Patient 07/29/13 0542     Chief Complaint  Patient presents with  . Abdominal Pain   (Consider location/radiation/quality/duration/timing/severity/associated sxs/prior Treatment) HPI This patient is a 43 year old woman with no history of previous abdominal surgeries. She presents with right lower quadrant abdominal pain which began yesterday evening. She awoke this morning with more severe pain. She notices that her pain is worse when she attempts to flex the right hip. She has had associated nausea with several episodes of nonbilious, nonbloody emesis. She denies fever. She denies dysuria, gross hematuria. She denies unusual vaginal discharge.  She rates her pain at 8 on a 0-to-10 scale. At home, it was 10 over 10. The pain is nonradiating. It is aching and cramping in nature.  Past Medical History  Diagnosis Date  . Asthma    Past Surgical History  Procedure Laterality Date  . Nasal sinus surgery     No family history on file. History  Substance Use Topics  . Smoking status: Never Smoker   . Smokeless tobacco: Not on file  . Alcohol Use: Yes     Comment: occasionally    OB History   Grav Para Term Preterm Abortions TAB SAB Ect Mult Living                 Review of Systems  Ten point review of symptoms performed and is negative with the exception of symptoms noted above.   Allergies  Doxycycline and Latex  Home Medications   Current Outpatient Rx  Name  Route  Sig  Dispense  Refill  . albuterol (PROVENTIL HFA;VENTOLIN HFA) 108 (90 BASE) MCG/ACT inhaler   Inhalation   Inhale 2 puffs into the lungs every 6 (six) hours as needed. For shortness of breath         . B Complex Vitamins (VITAMIN-B COMPLEX) TABS   Oral   Take 1 tablet by mouth daily.         . cetirizine (ZYRTEC) 10 MG tablet   Oral   Take 10 mg by mouth daily as needed for allergies.         .  fexofenadine-pseudoephedrine (ALLEGRA-D) 60-120 MG per tablet   Oral   Take 1 tablet by mouth daily as needed (congestion).          Marland Kitchen HYDROcodone-acetaminophen (NORCO/VICODIN) 5-325 MG per tablet   Oral   Take 1 tablet by mouth every 6 (six) hours as needed for moderate pain.         Marland Kitchen ibuprofen (ADVIL,MOTRIN) 200 MG tablet   Oral   Take 400 mg by mouth every 6 (six) hours as needed. For headaches         . Multiple Vitamin (MULTIVITAMIN WITH MINERALS) TABS   Oral   Take 1 tablet by mouth daily.         . Omega-3 Fatty Acids (FISH OIL PO)   Oral   Take 1 tablet by mouth daily.          BP 129/75  Pulse 74  Temp(Src) 98.4 F (36.9 C) (Oral)  Resp 18  SpO2 100%  LMP 07/15/2013 Physical Exam Gen: well developed and well nourished appearing Head: NCAT Eyes: PERL, EOMI Nose: no epistaixis or rhinorrhea Mouth/throat: mucosa is moist and pink Neck: supple, no stridor Lungs: CTA B, no wheezing, rhonchi or rales CV: RRR, no murmur, extremities appear well perfused.  Abd: soft, ttp over McBurnery's point, no rebound or gaurding, + psoas sign, no distension.   Back: no ttp, right sided CVA ttp is present.   Skin: warm and dry Ext: normal to inspection, no dependent edema Neuro: CN ii-xii grossly intact, no focal deficits Psyche; normal affect,  calm and cooperative.   ED Course  Procedures (including critical care time) Labs Review  Results for orders placed during the hospital encounter of 07/29/13 (from the past 24 hour(s))  CBC WITH DIFFERENTIAL     Status: Abnormal   Collection Time    07/29/13  6:00 AM      Result Value Range   WBC 12.5 (*) 4.0 - 10.5 K/uL   RBC 4.09  3.87 - 5.11 MIL/uL   Hemoglobin 12.9  12.0 - 15.0 g/dL   HCT 36.6  36.0 - 46.0 %   MCV 89.5  78.0 - 100.0 fL   MCH 31.5  26.0 - 34.0 pg   MCHC 35.2  30.0 - 36.0 g/dL   RDW 12.5  11.5 - 15.5 %   Platelets 396  150 - 400 K/uL   Neutrophils Relative % 74  43 - 77 %   Neutro Abs 9.3 (*)  1.7 - 7.7 K/uL   Lymphocytes Relative 16  12 - 46 %   Lymphs Abs 2.1  0.7 - 4.0 K/uL   Monocytes Relative 7  3 - 12 %   Monocytes Absolute 0.8  0.1 - 1.0 K/uL   Eosinophils Relative 3  0 - 5 %   Eosinophils Absolute 0.3  0.0 - 0.7 K/uL   Basophils Relative 0  0 - 1 %   Basophils Absolute 0.0  0.0 - 0.1 K/uL  URINALYSIS, ROUTINE W REFLEX MICROSCOPIC     Status: None   Collection Time    07/29/13  6:19 AM      Result Value Range   Color, Urine YELLOW  YELLOW   APPearance CLEAR  CLEAR   Specific Gravity, Urine 1.010  1.005 - 1.030   pH 8.0  5.0 - 8.0   Glucose, UA NEGATIVE  NEGATIVE mg/dL   Hgb urine dipstick NEGATIVE  NEGATIVE   Bilirubin Urine NEGATIVE  NEGATIVE   Ketones, ur NEGATIVE  NEGATIVE mg/dL   Protein, ur NEGATIVE  NEGATIVE mg/dL   Urobilinogen, UA 0.2  0.0 - 1.0 mg/dL   Nitrite NEGATIVE  NEGATIVE   Leukocytes, UA NEGATIVE  NEGATIVE  POCT PREGNANCY, URINE     Status: None   Collection Time    07/29/13  6:30 AM      Result Value Range   Preg Test, Ur NEGATIVE  NEGATIVE   CT Head Wo Contrast (Final result)  Result time: 07/29/13 03:18:25    Final result by Rad Results In Interface (07/29/13 03:18:25)    Narrative:   CLINICAL DATA: Severe headache  EXAM: CT HEAD WITHOUT CONTRAST  TECHNIQUE: Contiguous axial images were obtained from the base of the skull through the vertex without intravenous contrast.  COMPARISON: None.  FINDINGS: There is no acute intracranial hemorrhage or infarct. No mass lesion or midline shift. Gray-white matter differentiation is well maintained. Ventricles are normal in size without evidence of hydrocephalus. CSF containing spaces are within normal limits. No extra-axial fluid collection.  The calvarium is intact.  Orbital soft tissues are within normal limits.  Moderate mucoperiosteal thickening present within the left maxillary sinus. Scattered mucoperiosteal thickening present within the ethmoidal air cells as well. No  mastoid effusion.  Scalp  soft tissues are unremarkable.  IMPRESSION: 1. No acute intracranial abnormality. 2. Left maxillary sinusitis. This may be either infectious or inflammatory in nature.   Electronically Signed By: Jeannine Boga M.D. On: 07/29/2013 03:18     MDM  DDX: gastritis, PUD, GERD, pancreatitis, gallbladder disease, SBO, appendicits, colitis, UTI, enteritis.   Case signed out to Dr. Rolland Porter at change of shift. She will f/ul on results of CT and disposition accordingly.   Elyn Peers, MD 07/30/13 [phone]

## 2013-07-29 NOTE — ED Notes (Addendum)
Family member to nurses station, pt having more pain. Keshia RN made aware.

## 2013-07-29 NOTE — Discharge Instructions (Signed)
Drink plenty of fluids. Take the medications as prescribed. Call Dr Zettie Pho office to be seen. Save the stone when you pass it and either have Dr Deforest Hoyles or Dr Zettie Pho office send it to the lab to be analyzed.  Return to the [facility] ED if you get fever or have uncontrolled vomiting or pain. Unfortunately you have small kidney stones in both kidneys.    Urine Strainer This strainer is used to catch or filter out any stones found in your urine. Place the strainer under your urine stream. Save any stones or objects that you find in your urine. Place them in a plastic or glass container to show your caregiver. The stones vary in size - some can be very small, so make sure you check the strainer carefully. Your caregiver may send the stone to the lab. When the results are back, your caregiver may recommend medicines or diet changes.  Document Released: 04/03/2004 Document Revised: 09/21/2011 Document Reviewed: 05/11/2008 [facility] Patient Information 2014 [place].  Ureteral Colic (Kidney Stones) Ureteral colic is the result of a condition when kidney stones form inside the kidney. Once kidney stones are formed they may move into the tube that connects the kidney with the bladder (ureter). If this occurs, this condition may cause pain (colic) in the ureter.  CAUSES  Pain is caused by stone movement in the ureter and the obstruction caused by the stone. SYMPTOMS  The pain comes and goes as the ureter contracts around the stone. The pain is usually intense, sharp, and stabbing in character. The location of the pain may move as the stone moves through the ureter. When the stone is near the kidney the pain is usually located in the back and radiates to the belly (abdomen). When the stone is ready to pass into the bladder the pain is often located in the lower abdomen on the side the stone is located. At this location, the symptoms may mimic those of a urinary tract infection with urinary frequency.  Once the stone is located here it often passes into the bladder and the pain disappears completely. TREATMENT   Your caregiver will provide you with medicine for pain relief.  You may require specialized follow-up X-rays.  The absence of pain does not always mean that the stone has passed. It may have just stopped moving. If the urine remains completely obstructed, it can cause loss of kidney function or even complete destruction of the involved kidney. It is your responsibility and in your interest that X-rays and follow-ups as suggested by your caregiver are completed. Relief of pain without passage of the stone can be associated with severe damage to the kidney, including loss of kidney function on that side.  If your stone does not pass on its own, additional measures may be taken by your caregiver to ensure its removal. HOME CARE INSTRUCTIONS   Increase your fluid intake. Water is the preferred fluid since juices containing vitamin C may acidify the urine making it less likely for certain stones (uric acid stones) to pass.  Strain all urine. A strainer will be provided. Keep all particulate matter or stones for your caregiver to inspect.  Take your pain medicine as directed.  Make a follow-up appointment with your caregiver as directed.  Remember that the goal is passage of your stone. The absence of pain does not mean the stone is gone. Follow your caregiver's instructions.  Only take over-the-counter or prescription medicines for pain, discomfort, or fever as directed  by your caregiver. SEEK MEDICAL CARE IF:   Pain cannot be controlled with the prescribed medicine.  You have a fever.  Pain continues for longer than your caregiver advises it should.  There is a change in the pain, and you develop chest discomfort or constant abdominal pain.  You feel faint or pass out. MAKE SURE YOU:   Understand these instructions.  Will watch your condition.  Will get help right away  if you are not doing well or get worse. Document Released: 04/08/2005 Document Revised: 10/24/2012 Document Reviewed: 12/24/2010 [facility] Patient Information 2014 [place], [place].   Diet for Kidney Stones Kidney stones are small, hard masses that form inside your kidneys. They are made up of salts and minerals and often form when high levels build up in the urine. The minerals can then start to build up, crystalize, and stick together to form stones. There are several different types of kidney stones. The following types of stones may be influenced by dietary factors:   Calcium Oxalate Stones. An oxalate is a salt found in certain foods. Within the body, calcium can combine with oxalates to form calcium oxalate stones, which can be excreted in the urine in high amounts. This is the most common type of kidney stone.  Calcium Phosphate Stones. These stones may occur when the pH of the urine becomes too high, or less acidic, from too much calcium being excreted in the urine. The pH is a measure of how acidic or basic a substance is.  Uric Acid Stones. This type of stone occurs when the pH of the urine becomes too low, or very acidic, because substances called purines build up in the urine. Purines are found in animal proteins. When the urine is highly concentrated with acid, uric acid kidney stones can form.  Other risk factors for kidney stones include genetics, environment, and being overweight. Your caregiver may ask you to follow specific diet guidelines based on the type of stone you have to lessen the chances of your body making more kidney stones.  GENERAL GUIDELINES FOR ALL TYPES OF STONES  Drink plenty of fluid. Drink 12 16 cups of fluid a day, drinking mainly water.This is the most important thing you can do to prevent the formation of future kidney stones.  Maintain a healthy weight. Your caregiver or dietitian can help you determine what a healthy weight is for you. If you are  overweight, weight loss may help prevent the formation of future kidney stones.  Eat a diet adequate in animal protein. Too much animal protein can contribute to the formation of stones. Your dietitian can help you determine how much protein you should be eating. Avoid low carbohydrate, high protein diets.  Follow a balanced eating approach. The DASH diet, which stands for "Dietary Approaches to Stop Hypertension," is an effective meal plan for reducing stone formation. This diet is high in fruits, vegetables, dairy, and whole grains and low in animal protein. Ask your caregiver or dietitian for information about the DASH diet. ADDITIONAL DIET GUIDELINES FOR CALCIUM STONES Avoid foods high in salt. This includes table salt, salt seasonings, MSG, soy sauce, cured and processed meats, salted crackers and snack foods, fast food, and canned soups and foods. Ask your caregiver or dietitian for information about reducing sodium in your diet or following the low sodium diet.  Ensure adequate calcium intake. Use the following table for calcium guidelines:  Men 49 years old and younger  1000 mg/day.  Men 53 years old  and older  1500 mg/day.  Women 76 44 years old  1000 mg/day.  Women 50 years and older  1500 mg/day. Your dietitian can help you determine if you are getting enough calcium in your diet. Foods that are high in calcium include dairy products, broccoli, cheese, yogurt, and pudding. If you need to take a calcium supplement, take it only in the form of calcium citrate.  Avoid foods high in oxalate. Be sure that any supplements you take do not contain more than 500 mg of vitamin C. Vitamin C is converted into oxalate in the body. You do not need to avoid fruits and vegetables high in vitamin C.   Grains: High-fiber or bran cereal, whole-wheat bread, grits, barley, buckwheat, amaranth, pretzels, and fruitcake.  Vegetables: Dried beans, wax beans, dark leafy greens, eggplant, leeks, okra, parsley,  rutabaga, tomato paste, watercress, zucchini, and escarole.  Fruit: Dried apricots, red currants, figs, kiwi, and rhubarb.  Meat and Meat Substitutes: Soybeans and foods made from soy (soyburger, miso), dried beans, peanut butter.  Milk: Chocolate milk mixes and soymilk.  Fats and Oils: Nuts (peanuts, almonds, pecans, cashews, hazelnuts) and nut butters, sesame seeds, and tDahini paste.  Condiments/Miscellaneous: Chocolate, carob, marmalade, poppy seeds, instant iced tea, and juice from high-oxalate fruits.  Document Released: 10/24/2010 Document Revised: 12/29/2011 Document Reviewed: 12/14/2011 [facility] Patient Information 2014 [place].

## 2013-07-29 NOTE — ED Provider Notes (Signed)
Patient left the changes shift to get report of her CT scan. Patient reports prior kidney stone about one and a half years ago. She did not followup with the urologist. She reports she started having dysuria 4 days ago and then had right upper quadrant pain that sort of radiated down to her right lower quadrant. She relates when she was examined by Dr. Lemar Livings she had right CVA tenderness.  PT c/o headache, does not want more morphine for pain.   Patient is alert and cooperative. Her abdomen is mildly tender in the right abdomen.  Treatment of kidney stone was discussed with patient and her family. She was started on anti-inflammatories because her stone is small and she should be able to pass it on her and, I do not think she will need intervention by urology. She however is requesting referral to urology. She denies any family history of renal stones.  Ct Abdomen Pelvis W Contrast  07/29/2013   CLINICAL DATA:  Right lower quadrant pain. Nausea and vomiting. Urolithiasis. IUD.  EXAM: CT ABDOMEN AND PELVIS WITH CONTRAST  TECHNIQUE: Multidetector CT imaging of the abdomen and pelvis was performed using the standard protocol following bolus administration of intravenous contrast.  CONTRAST:  124mL OMNIPAQUE IOHEXOL 300 MG/ML  SOLN  COMPARISON:  05/09/2012  FINDINGS: Moderate right hydronephrosis and ureterectasis is seen. A 2 mm calculus is seen at the right ureterovesical junction. A few less than 1 cm nonobstructive calculi are again noted in both kidneys. No evidence of left ureteral calculus or hydronephrosis.  1 cm low-attenuation lesion in the anterior left hepatic lobe on image 12 remains stable, consistent with benign etiology. No new or enlarging liver lesions identified. The gallbladder, pancreas, spleen, and adrenal glands are normal in appearance. IUD seen in expected location in the uterus. Adnexal regions are unremarkable.  No soft tissue masses or lymphadenopathy identified. No evidence of  inflammatory process or abnormal fluid collections. No evidence of bowel wall thickening or dilatation.  IMPRESSION: Moderate right hydroureteronephrosis due to 2 mm calculus at the right ureterovesical junction.  Bilateral nephrolithiasis.   Electronically Signed   By: Earle Gell M.D.   On: 07/29/2013 09:50    None  Final diagnoses:  Ureteral stone with hydronephrosis  Right ureteral stone   New Prescriptions   KETOROLAC (TORADOL) 10 MG TABLET    Take 1 tablet (10 mg total) by mouth every 6 (six) hours.   ONDANSETRON (ZOFRAN ODT) 4 MG DISINTEGRATING TABLET    Take 1 tablet (4 mg total) by mouth every 8 (eight) hours as needed for nausea or vomiting.   OXYCODONE-ACETAMINOPHEN (PERCOCET/ROXICET) 5-325 MG PER TABLET    Take 1 or 2 po Q 6hrs for pain   TAMSULOSIN (FLOMAX) 0.4 MG CAPS CAPSULE    Once daily until you pass the kidney stone    Plan discharge   Rolland Porter, MD, Alanson Aly, MD 07/29/13 1135

## 2013-07-30 LAB — URINE CULTURE: Colony Count: 5000

## 2014-09-03 ENCOUNTER — Other Ambulatory Visit: Payer: Self-pay

## 2014-09-03 DIAGNOSIS — Z1231 Encounter for screening mammogram for malignant neoplasm of breast: Secondary | ICD-10-CM

## 2014-09-21 ENCOUNTER — Ambulatory Visit: Payer: Self-pay

## 2015-06-20 ENCOUNTER — Encounter: Payer: Self-pay | Admitting: Internal Medicine

## 2015-10-21 DIAGNOSIS — M5384 Other specified dorsopathies, thoracic region: Secondary | ICD-10-CM | POA: Diagnosis not present

## 2015-10-21 DIAGNOSIS — M9901 Segmental and somatic dysfunction of cervical region: Secondary | ICD-10-CM | POA: Diagnosis not present

## 2015-10-21 DIAGNOSIS — M5412 Radiculopathy, cervical region: Secondary | ICD-10-CM | POA: Diagnosis not present

## 2015-10-21 DIAGNOSIS — M9902 Segmental and somatic dysfunction of thoracic region: Secondary | ICD-10-CM | POA: Diagnosis not present

## 2015-10-30 DIAGNOSIS — M9901 Segmental and somatic dysfunction of cervical region: Secondary | ICD-10-CM | POA: Diagnosis not present

## 2015-10-30 DIAGNOSIS — M2141 Flat foot [pes planus] (acquired), right foot: Secondary | ICD-10-CM | POA: Diagnosis not present

## 2015-10-30 DIAGNOSIS — M9902 Segmental and somatic dysfunction of thoracic region: Secondary | ICD-10-CM | POA: Diagnosis not present

## 2015-10-30 DIAGNOSIS — M5412 Radiculopathy, cervical region: Secondary | ICD-10-CM | POA: Diagnosis not present

## 2015-11-06 DIAGNOSIS — Z01411 Encounter for gynecological examination (general) (routine) with abnormal findings: Secondary | ICD-10-CM | POA: Diagnosis not present

## 2015-11-06 DIAGNOSIS — Z01419 Encounter for gynecological examination (general) (routine) without abnormal findings: Secondary | ICD-10-CM | POA: Diagnosis not present

## 2015-11-06 DIAGNOSIS — M816 Localized osteoporosis [Lequesne]: Secondary | ICD-10-CM | POA: Diagnosis not present

## 2015-11-06 DIAGNOSIS — Z13820 Encounter for screening for osteoporosis: Secondary | ICD-10-CM | POA: Diagnosis not present

## 2015-11-06 DIAGNOSIS — N951 Menopausal and female climacteric states: Secondary | ICD-10-CM | POA: Diagnosis not present

## 2015-11-11 DIAGNOSIS — M9902 Segmental and somatic dysfunction of thoracic region: Secondary | ICD-10-CM | POA: Diagnosis not present

## 2015-11-11 DIAGNOSIS — M5412 Radiculopathy, cervical region: Secondary | ICD-10-CM | POA: Diagnosis not present

## 2015-11-11 DIAGNOSIS — M9901 Segmental and somatic dysfunction of cervical region: Secondary | ICD-10-CM | POA: Diagnosis not present

## 2015-11-13 DIAGNOSIS — J301 Allergic rhinitis due to pollen: Secondary | ICD-10-CM | POA: Diagnosis not present

## 2015-11-15 DIAGNOSIS — J301 Allergic rhinitis due to pollen: Secondary | ICD-10-CM | POA: Diagnosis not present

## 2015-11-20 DIAGNOSIS — J301 Allergic rhinitis due to pollen: Secondary | ICD-10-CM | POA: Diagnosis not present

## 2015-11-25 DIAGNOSIS — M2141 Flat foot [pes planus] (acquired), right foot: Secondary | ICD-10-CM | POA: Diagnosis not present

## 2015-11-25 DIAGNOSIS — M9902 Segmental and somatic dysfunction of thoracic region: Secondary | ICD-10-CM | POA: Diagnosis not present

## 2015-11-25 DIAGNOSIS — M5412 Radiculopathy, cervical region: Secondary | ICD-10-CM | POA: Diagnosis not present

## 2015-11-25 DIAGNOSIS — M9901 Segmental and somatic dysfunction of cervical region: Secondary | ICD-10-CM | POA: Diagnosis not present

## 2015-11-27 DIAGNOSIS — J301 Allergic rhinitis due to pollen: Secondary | ICD-10-CM | POA: Diagnosis not present

## 2015-12-04 DIAGNOSIS — J301 Allergic rhinitis due to pollen: Secondary | ICD-10-CM | POA: Diagnosis not present

## 2015-12-11 DIAGNOSIS — J301 Allergic rhinitis due to pollen: Secondary | ICD-10-CM | POA: Diagnosis not present

## 2015-12-25 DIAGNOSIS — J301 Allergic rhinitis due to pollen: Secondary | ICD-10-CM | POA: Diagnosis not present

## 2015-12-30 DIAGNOSIS — M9901 Segmental and somatic dysfunction of cervical region: Secondary | ICD-10-CM | POA: Diagnosis not present

## 2015-12-30 DIAGNOSIS — M9902 Segmental and somatic dysfunction of thoracic region: Secondary | ICD-10-CM | POA: Diagnosis not present

## 2015-12-30 DIAGNOSIS — M2141 Flat foot [pes planus] (acquired), right foot: Secondary | ICD-10-CM | POA: Diagnosis not present

## 2016-01-01 DIAGNOSIS — J301 Allergic rhinitis due to pollen: Secondary | ICD-10-CM | POA: Diagnosis not present

## 2016-01-08 DIAGNOSIS — J301 Allergic rhinitis due to pollen: Secondary | ICD-10-CM | POA: Diagnosis not present

## 2016-01-22 DIAGNOSIS — J301 Allergic rhinitis due to pollen: Secondary | ICD-10-CM | POA: Diagnosis not present

## 2016-02-05 DIAGNOSIS — J301 Allergic rhinitis due to pollen: Secondary | ICD-10-CM | POA: Diagnosis not present

## 2016-02-07 DIAGNOSIS — J301 Allergic rhinitis due to pollen: Secondary | ICD-10-CM | POA: Diagnosis not present

## 2016-02-19 DIAGNOSIS — J301 Allergic rhinitis due to pollen: Secondary | ICD-10-CM | POA: Diagnosis not present

## 2016-02-26 DIAGNOSIS — J301 Allergic rhinitis due to pollen: Secondary | ICD-10-CM | POA: Diagnosis not present

## 2016-03-19 DIAGNOSIS — Z23 Encounter for immunization: Secondary | ICD-10-CM | POA: Diagnosis not present

## 2016-03-25 DIAGNOSIS — J301 Allergic rhinitis due to pollen: Secondary | ICD-10-CM | POA: Diagnosis not present

## 2016-04-15 DIAGNOSIS — J301 Allergic rhinitis due to pollen: Secondary | ICD-10-CM | POA: Diagnosis not present

## 2016-04-28 ENCOUNTER — Other Ambulatory Visit: Payer: Self-pay | Admitting: Obstetrics & Gynecology

## 2016-04-28 ENCOUNTER — Other Ambulatory Visit: Payer: Self-pay | Admitting: Obstetrics and Gynecology

## 2016-04-28 DIAGNOSIS — Z1231 Encounter for screening mammogram for malignant neoplasm of breast: Secondary | ICD-10-CM

## 2016-04-29 DIAGNOSIS — J301 Allergic rhinitis due to pollen: Secondary | ICD-10-CM | POA: Diagnosis not present

## 2016-05-05 DIAGNOSIS — J301 Allergic rhinitis due to pollen: Secondary | ICD-10-CM | POA: Diagnosis not present

## 2016-05-07 DIAGNOSIS — Z23 Encounter for immunization: Secondary | ICD-10-CM | POA: Diagnosis not present

## 2016-05-13 ENCOUNTER — Ambulatory Visit: Payer: Self-pay

## 2016-05-13 DIAGNOSIS — R6882 Decreased libido: Secondary | ICD-10-CM | POA: Diagnosis not present

## 2016-05-13 DIAGNOSIS — Z30433 Encounter for removal and reinsertion of intrauterine contraceptive device: Secondary | ICD-10-CM | POA: Diagnosis not present

## 2016-05-13 DIAGNOSIS — Z3200 Encounter for pregnancy test, result unknown: Secondary | ICD-10-CM | POA: Diagnosis not present

## 2016-05-13 DIAGNOSIS — N951 Menopausal and female climacteric states: Secondary | ICD-10-CM | POA: Diagnosis not present

## 2016-05-15 ENCOUNTER — Ambulatory Visit
Admission: RE | Admit: 2016-05-15 | Discharge: 2016-05-15 | Disposition: A | Discharge status: Home / Self Care | Payer: BLUE CROSS/BLUE SHIELD | Source: Ambulatory Visit | Attending: Obstetrics and Gynecology | Admitting: Obstetrics and Gynecology

## 2016-05-15 DIAGNOSIS — Z1231 Encounter for screening mammogram for malignant neoplasm of breast: Secondary | ICD-10-CM

## 2016-05-20 ENCOUNTER — Other Ambulatory Visit: Payer: Self-pay | Admitting: Obstetrics and Gynecology

## 2016-05-20 DIAGNOSIS — R928 Other abnormal and inconclusive findings on diagnostic imaging of breast: Secondary | ICD-10-CM

## 2016-05-25 ENCOUNTER — Ambulatory Visit
Admission: RE | Admit: 2016-05-25 | Discharge: 2016-05-25 | Disposition: A | Discharge status: Home / Self Care | Payer: BLUE CROSS/BLUE SHIELD | Source: Ambulatory Visit | Attending: Obstetrics and Gynecology | Admitting: Obstetrics and Gynecology

## 2016-05-25 DIAGNOSIS — N6011 Diffuse cystic mastopathy of right breast: Secondary | ICD-10-CM | POA: Diagnosis not present

## 2016-05-25 DIAGNOSIS — R928 Other abnormal and inconclusive findings on diagnostic imaging of breast: Secondary | ICD-10-CM

## 2016-05-25 DIAGNOSIS — N6314 Unspecified lump in the right breast, lower inner quadrant: Secondary | ICD-10-CM | POA: Diagnosis not present

## 2016-05-26 ENCOUNTER — Other Ambulatory Visit: Payer: BLUE CROSS/BLUE SHIELD

## 2016-07-23 DIAGNOSIS — R0989 Other specified symptoms and signs involving the circulatory and respiratory systems: Secondary | ICD-10-CM | POA: Diagnosis not present

## 2016-07-23 DIAGNOSIS — J0110 Acute frontal sinusitis, unspecified: Secondary | ICD-10-CM | POA: Diagnosis not present

## 2016-07-23 DIAGNOSIS — R05 Cough: Secondary | ICD-10-CM | POA: Diagnosis not present

## 2016-09-23 DIAGNOSIS — L700 Acne vulgaris: Secondary | ICD-10-CM | POA: Diagnosis not present

## 2016-09-23 DIAGNOSIS — L570 Actinic keratosis: Secondary | ICD-10-CM | POA: Diagnosis not present

## 2016-09-23 DIAGNOSIS — D229 Melanocytic nevi, unspecified: Secondary | ICD-10-CM | POA: Diagnosis not present

## 2016-09-23 DIAGNOSIS — L820 Inflamed seborrheic keratosis: Secondary | ICD-10-CM | POA: Diagnosis not present

## 2016-09-23 DIAGNOSIS — Z85828 Personal history of other malignant neoplasm of skin: Secondary | ICD-10-CM | POA: Diagnosis not present

## 2016-09-23 DIAGNOSIS — Z1283 Encounter for screening for malignant neoplasm of skin: Secondary | ICD-10-CM | POA: Diagnosis not present

## 2016-11-18 DIAGNOSIS — M542 Cervicalgia: Secondary | ICD-10-CM | POA: Diagnosis not present

## 2016-11-18 DIAGNOSIS — M25511 Pain in right shoulder: Secondary | ICD-10-CM | POA: Diagnosis not present

## 2016-11-18 DIAGNOSIS — G43709 Chronic migraine without aura, not intractable, without status migrainosus: Secondary | ICD-10-CM | POA: Diagnosis not present

## 2016-11-25 DIAGNOSIS — M542 Cervicalgia: Secondary | ICD-10-CM | POA: Diagnosis not present

## 2016-11-25 DIAGNOSIS — G43709 Chronic migraine without aura, not intractable, without status migrainosus: Secondary | ICD-10-CM | POA: Diagnosis not present

## 2016-11-25 DIAGNOSIS — M25511 Pain in right shoulder: Secondary | ICD-10-CM | POA: Diagnosis not present

## 2016-12-16 DIAGNOSIS — G43709 Chronic migraine without aura, not intractable, without status migrainosus: Secondary | ICD-10-CM | POA: Diagnosis not present

## 2016-12-16 DIAGNOSIS — M542 Cervicalgia: Secondary | ICD-10-CM | POA: Diagnosis not present

## 2016-12-16 DIAGNOSIS — M25511 Pain in right shoulder: Secondary | ICD-10-CM | POA: Diagnosis not present

## 2016-12-30 DIAGNOSIS — G43709 Chronic migraine without aura, not intractable, without status migrainosus: Secondary | ICD-10-CM | POA: Diagnosis not present

## 2016-12-30 DIAGNOSIS — M25511 Pain in right shoulder: Secondary | ICD-10-CM | POA: Diagnosis not present

## 2016-12-30 DIAGNOSIS — M542 Cervicalgia: Secondary | ICD-10-CM | POA: Diagnosis not present

## 2017-04-28 DIAGNOSIS — Z23 Encounter for immunization: Secondary | ICD-10-CM | POA: Diagnosis not present

## 2017-05-19 DIAGNOSIS — K219 Gastro-esophageal reflux disease without esophagitis: Secondary | ICD-10-CM | POA: Diagnosis not present

## 2017-05-19 DIAGNOSIS — Z1389 Encounter for screening for other disorder: Secondary | ICD-10-CM | POA: Diagnosis not present

## 2017-05-19 DIAGNOSIS — Z136 Encounter for screening for cardiovascular disorders: Secondary | ICD-10-CM | POA: Diagnosis not present

## 2017-05-19 DIAGNOSIS — K5190 Ulcerative colitis, unspecified, without complications: Secondary | ICD-10-CM | POA: Diagnosis not present

## 2017-05-19 DIAGNOSIS — Z0000 Encounter for general adult medical examination without abnormal findings: Secondary | ICD-10-CM | POA: Diagnosis not present

## 2017-05-19 DIAGNOSIS — J309 Allergic rhinitis, unspecified: Secondary | ICD-10-CM | POA: Diagnosis not present

## 2017-06-16 DIAGNOSIS — K5190 Ulcerative colitis, unspecified, without complications: Secondary | ICD-10-CM | POA: Diagnosis not present

## 2017-06-16 DIAGNOSIS — K219 Gastro-esophageal reflux disease without esophagitis: Secondary | ICD-10-CM | POA: Diagnosis not present

## 2017-06-23 DIAGNOSIS — M25511 Pain in right shoulder: Secondary | ICD-10-CM | POA: Diagnosis not present

## 2017-06-23 DIAGNOSIS — M542 Cervicalgia: Secondary | ICD-10-CM | POA: Diagnosis not present

## 2017-06-23 DIAGNOSIS — G43709 Chronic migraine without aura, not intractable, without status migrainosus: Secondary | ICD-10-CM | POA: Diagnosis not present

## 2017-07-02 DIAGNOSIS — M25511 Pain in right shoulder: Secondary | ICD-10-CM | POA: Diagnosis not present

## 2017-07-02 DIAGNOSIS — G43709 Chronic migraine without aura, not intractable, without status migrainosus: Secondary | ICD-10-CM | POA: Diagnosis not present

## 2017-07-02 DIAGNOSIS — M542 Cervicalgia: Secondary | ICD-10-CM | POA: Diagnosis not present

## 2017-07-07 DIAGNOSIS — Z01419 Encounter for gynecological examination (general) (routine) without abnormal findings: Secondary | ICD-10-CM | POA: Diagnosis not present

## 2017-07-07 DIAGNOSIS — Z1329 Encounter for screening for other suspected endocrine disorder: Secondary | ICD-10-CM | POA: Diagnosis not present

## 2017-07-07 DIAGNOSIS — Z6821 Body mass index (BMI) 21.0-21.9, adult: Secondary | ICD-10-CM | POA: Diagnosis not present

## 2017-07-21 DIAGNOSIS — M25511 Pain in right shoulder: Secondary | ICD-10-CM | POA: Diagnosis not present

## 2017-07-21 DIAGNOSIS — G43709 Chronic migraine without aura, not intractable, without status migrainosus: Secondary | ICD-10-CM | POA: Diagnosis not present

## 2017-07-21 DIAGNOSIS — M542 Cervicalgia: Secondary | ICD-10-CM | POA: Diagnosis not present

## 2017-07-28 DIAGNOSIS — M25511 Pain in right shoulder: Secondary | ICD-10-CM | POA: Diagnosis not present

## 2017-07-28 DIAGNOSIS — M542 Cervicalgia: Secondary | ICD-10-CM | POA: Diagnosis not present

## 2017-07-28 DIAGNOSIS — G43709 Chronic migraine without aura, not intractable, without status migrainosus: Secondary | ICD-10-CM | POA: Diagnosis not present

## 2017-07-30 ENCOUNTER — Other Ambulatory Visit: Payer: Self-pay | Admitting: Obstetrics and Gynecology

## 2017-07-30 DIAGNOSIS — Z1231 Encounter for screening mammogram for malignant neoplasm of breast: Secondary | ICD-10-CM

## 2017-08-04 DIAGNOSIS — M25511 Pain in right shoulder: Secondary | ICD-10-CM | POA: Diagnosis not present

## 2017-08-04 DIAGNOSIS — G43709 Chronic migraine without aura, not intractable, without status migrainosus: Secondary | ICD-10-CM | POA: Diagnosis not present

## 2017-08-04 DIAGNOSIS — M542 Cervicalgia: Secondary | ICD-10-CM | POA: Diagnosis not present

## 2017-08-11 DIAGNOSIS — R799 Abnormal finding of blood chemistry, unspecified: Secondary | ICD-10-CM | POA: Diagnosis not present

## 2017-08-20 ENCOUNTER — Ambulatory Visit: Payer: BLUE CROSS/BLUE SHIELD

## 2017-09-08 DIAGNOSIS — L03211 Cellulitis of face: Secondary | ICD-10-CM | POA: Diagnosis not present

## 2017-09-08 DIAGNOSIS — R21 Rash and other nonspecific skin eruption: Secondary | ICD-10-CM | POA: Diagnosis not present

## 2017-09-14 DIAGNOSIS — Z20828 Contact with and (suspected) exposure to other viral communicable diseases: Secondary | ICD-10-CM | POA: Diagnosis not present

## 2017-09-16 DIAGNOSIS — L089 Local infection of the skin and subcutaneous tissue, unspecified: Secondary | ICD-10-CM | POA: Diagnosis not present

## 2017-09-22 DIAGNOSIS — K5289 Other specified noninfective gastroenteritis and colitis: Secondary | ICD-10-CM | POA: Diagnosis not present

## 2017-09-22 DIAGNOSIS — Z8719 Personal history of other diseases of the digestive system: Secondary | ICD-10-CM | POA: Diagnosis not present

## 2017-09-22 DIAGNOSIS — K640 First degree hemorrhoids: Secondary | ICD-10-CM | POA: Diagnosis not present

## 2017-09-28 DIAGNOSIS — K5289 Other specified noninfective gastroenteritis and colitis: Secondary | ICD-10-CM | POA: Diagnosis not present

## 2017-09-30 DIAGNOSIS — D72825 Bandemia: Secondary | ICD-10-CM | POA: Diagnosis not present

## 2017-10-01 DIAGNOSIS — L03211 Cellulitis of face: Secondary | ICD-10-CM | POA: Diagnosis not present

## 2017-10-14 DIAGNOSIS — L0201 Cutaneous abscess of face: Secondary | ICD-10-CM | POA: Diagnosis not present

## 2017-10-20 DIAGNOSIS — L299 Pruritus, unspecified: Secondary | ICD-10-CM | POA: Diagnosis not present

## 2017-10-20 DIAGNOSIS — L309 Dermatitis, unspecified: Secondary | ICD-10-CM | POA: Diagnosis not present

## 2017-10-28 ENCOUNTER — Ambulatory Visit
Admission: RE | Admit: 2017-10-28 | Discharge: 2017-10-28 | Disposition: A | Discharge status: Home / Self Care | Payer: BLUE CROSS/BLUE SHIELD | Source: Ambulatory Visit | Attending: Obstetrics and Gynecology | Admitting: Obstetrics and Gynecology

## 2017-10-28 DIAGNOSIS — Z1231 Encounter for screening mammogram for malignant neoplasm of breast: Secondary | ICD-10-CM

## 2017-10-28 DIAGNOSIS — N200 Calculus of kidney: Secondary | ICD-10-CM | POA: Diagnosis not present

## 2017-11-01 DIAGNOSIS — L738 Other specified follicular disorders: Secondary | ICD-10-CM | POA: Diagnosis not present

## 2017-11-01 DIAGNOSIS — L299 Pruritus, unspecified: Secondary | ICD-10-CM | POA: Diagnosis not present

## 2017-11-01 DIAGNOSIS — D485 Neoplasm of uncertain behavior of skin: Secondary | ICD-10-CM | POA: Diagnosis not present

## 2017-11-11 DIAGNOSIS — L719 Rosacea, unspecified: Secondary | ICD-10-CM | POA: Diagnosis not present

## 2017-11-11 DIAGNOSIS — B029 Zoster without complications: Secondary | ICD-10-CM | POA: Diagnosis not present

## 2017-12-01 DIAGNOSIS — B359 Dermatophytosis, unspecified: Secondary | ICD-10-CM | POA: Diagnosis not present

## 2017-12-01 DIAGNOSIS — L299 Pruritus, unspecified: Secondary | ICD-10-CM | POA: Diagnosis not present

## 2017-12-01 DIAGNOSIS — L300 Nummular dermatitis: Secondary | ICD-10-CM | POA: Diagnosis not present

## 2017-12-01 DIAGNOSIS — D485 Neoplasm of uncertain behavior of skin: Secondary | ICD-10-CM | POA: Diagnosis not present

## 2017-12-08 DIAGNOSIS — R030 Elevated blood-pressure reading, without diagnosis of hypertension: Secondary | ICD-10-CM | POA: Diagnosis not present

## 2017-12-08 DIAGNOSIS — F411 Generalized anxiety disorder: Secondary | ICD-10-CM | POA: Diagnosis not present

## 2017-12-24 DIAGNOSIS — F419 Anxiety disorder, unspecified: Secondary | ICD-10-CM | POA: Diagnosis not present

## 2017-12-24 DIAGNOSIS — R030 Elevated blood-pressure reading, without diagnosis of hypertension: Secondary | ICD-10-CM | POA: Diagnosis not present

## 2018-04-18 DIAGNOSIS — N200 Calculus of kidney: Secondary | ICD-10-CM | POA: Diagnosis not present

## 2018-04-18 DIAGNOSIS — K6289 Other specified diseases of anus and rectum: Secondary | ICD-10-CM | POA: Diagnosis not present

## 2018-04-19 DIAGNOSIS — M542 Cervicalgia: Secondary | ICD-10-CM | POA: Diagnosis not present

## 2018-04-19 DIAGNOSIS — M25511 Pain in right shoulder: Secondary | ICD-10-CM | POA: Diagnosis not present

## 2018-04-19 DIAGNOSIS — G43709 Chronic migraine without aura, not intractable, without status migrainosus: Secondary | ICD-10-CM | POA: Diagnosis not present

## 2018-05-15 DIAGNOSIS — M2241 Chondromalacia patellae, right knee: Secondary | ICD-10-CM | POA: Diagnosis not present

## 2018-05-15 DIAGNOSIS — M2242 Chondromalacia patellae, left knee: Secondary | ICD-10-CM | POA: Diagnosis not present

## 2018-05-15 DIAGNOSIS — S83282A Other tear of lateral meniscus, current injury, left knee, initial encounter: Secondary | ICD-10-CM | POA: Diagnosis not present

## 2018-05-16 DIAGNOSIS — M542 Cervicalgia: Secondary | ICD-10-CM | POA: Diagnosis not present

## 2018-05-16 DIAGNOSIS — M25511 Pain in right shoulder: Secondary | ICD-10-CM | POA: Diagnosis not present

## 2018-05-16 DIAGNOSIS — G43709 Chronic migraine without aura, not intractable, without status migrainosus: Secondary | ICD-10-CM | POA: Diagnosis not present

## 2018-05-23 DIAGNOSIS — Z13220 Encounter for screening for lipoid disorders: Secondary | ICD-10-CM | POA: Diagnosis not present

## 2018-05-23 DIAGNOSIS — Z1389 Encounter for screening for other disorder: Secondary | ICD-10-CM | POA: Diagnosis not present

## 2018-05-23 DIAGNOSIS — Z0000 Encounter for general adult medical examination without abnormal findings: Secondary | ICD-10-CM | POA: Diagnosis not present

## 2018-08-15 DIAGNOSIS — Z111 Encounter for screening for respiratory tuberculosis: Secondary | ICD-10-CM | POA: Diagnosis not present

## 2018-08-19 DIAGNOSIS — B354 Tinea corporis: Secondary | ICD-10-CM | POA: Diagnosis not present

## 2018-09-05 DIAGNOSIS — M542 Cervicalgia: Secondary | ICD-10-CM | POA: Diagnosis not present

## 2018-09-05 DIAGNOSIS — G43709 Chronic migraine without aura, not intractable, without status migrainosus: Secondary | ICD-10-CM | POA: Diagnosis not present

## 2018-09-05 DIAGNOSIS — M25511 Pain in right shoulder: Secondary | ICD-10-CM | POA: Diagnosis not present

## 2019-01-23 ENCOUNTER — Other Ambulatory Visit: Payer: Self-pay | Admitting: Obstetrics and Gynecology

## 2019-01-23 DIAGNOSIS — Z1231 Encounter for screening mammogram for malignant neoplasm of breast: Secondary | ICD-10-CM

## 2019-03-06 ENCOUNTER — Other Ambulatory Visit: Payer: Self-pay

## 2019-03-06 ENCOUNTER — Ambulatory Visit
Admission: RE | Admit: 2019-03-06 | Discharge: 2019-03-06 | Disposition: A | Discharge status: Home / Self Care | Payer: BC Managed Care – PPO | Source: Ambulatory Visit | Attending: Obstetrics and Gynecology | Admitting: Obstetrics and Gynecology

## 2019-03-06 DIAGNOSIS — Z1231 Encounter for screening mammogram for malignant neoplasm of breast: Secondary | ICD-10-CM | POA: Diagnosis not present

## 2019-03-08 ENCOUNTER — Other Ambulatory Visit: Payer: Self-pay | Admitting: Obstetrics and Gynecology

## 2019-03-08 DIAGNOSIS — R928 Other abnormal and inconclusive findings on diagnostic imaging of breast: Secondary | ICD-10-CM

## 2019-03-10 ENCOUNTER — Other Ambulatory Visit: Payer: Self-pay | Admitting: Obstetrics and Gynecology

## 2019-03-10 ENCOUNTER — Ambulatory Visit
Admission: RE | Admit: 2019-03-10 | Discharge: 2019-03-10 | Disposition: A | Discharge status: Home / Self Care | Payer: BC Managed Care – PPO | Source: Ambulatory Visit | Attending: Obstetrics and Gynecology | Admitting: Obstetrics and Gynecology

## 2019-03-10 ENCOUNTER — Other Ambulatory Visit: Payer: Self-pay

## 2019-03-10 DIAGNOSIS — R928 Other abnormal and inconclusive findings on diagnostic imaging of breast: Secondary | ICD-10-CM | POA: Diagnosis not present

## 2019-03-10 DIAGNOSIS — N6323 Unspecified lump in the left breast, lower outer quadrant: Secondary | ICD-10-CM | POA: Diagnosis not present

## 2019-03-11 DIAGNOSIS — N760 Acute vaginitis: Secondary | ICD-10-CM | POA: Diagnosis not present

## 2019-03-14 DIAGNOSIS — Z170 Estrogen receptor positive status [ER+]: Secondary | ICD-10-CM

## 2019-03-14 DIAGNOSIS — C50212 Malignant neoplasm of upper-inner quadrant of left female breast: Secondary | ICD-10-CM

## 2019-03-14 HISTORY — DX: Estrogen receptor positive status (ER+): Z17.0

## 2019-03-14 HISTORY — DX: Malignant neoplasm of upper-inner quadrant of left female breast: C50.212

## 2019-03-16 ENCOUNTER — Other Ambulatory Visit: Payer: Self-pay

## 2019-03-16 ENCOUNTER — Ambulatory Visit
Admission: RE | Admit: 2019-03-16 | Discharge: 2019-03-16 | Disposition: A | Discharge status: Home / Self Care | Payer: BC Managed Care – PPO | Source: Ambulatory Visit | Attending: Obstetrics and Gynecology | Admitting: Obstetrics and Gynecology

## 2019-03-16 DIAGNOSIS — R928 Other abnormal and inconclusive findings on diagnostic imaging of breast: Secondary | ICD-10-CM

## 2019-03-16 DIAGNOSIS — C50212 Malignant neoplasm of upper-inner quadrant of left female breast: Secondary | ICD-10-CM | POA: Diagnosis not present

## 2019-03-16 DIAGNOSIS — Z170 Estrogen receptor positive status [ER+]: Secondary | ICD-10-CM | POA: Diagnosis not present

## 2019-03-16 DIAGNOSIS — N6322 Unspecified lump in the left breast, upper inner quadrant: Secondary | ICD-10-CM | POA: Diagnosis not present

## 2019-03-16 DIAGNOSIS — N6323 Unspecified lump in the left breast, lower outer quadrant: Secondary | ICD-10-CM | POA: Diagnosis not present

## 2019-03-16 DIAGNOSIS — D242 Benign neoplasm of left breast: Secondary | ICD-10-CM | POA: Diagnosis not present

## 2019-03-17 ENCOUNTER — Telehealth: Payer: Self-pay | Admitting: Hematology

## 2019-03-17 ENCOUNTER — Encounter: Payer: Self-pay | Admitting: *Deleted

## 2019-03-17 NOTE — Telephone Encounter (Signed)
Spoke with patient to confirm afternoon [facility] appointment for 9/9, packet will be emailed to patient

## 2019-03-18 DIAGNOSIS — Z139 Encounter for screening, unspecified: Secondary | ICD-10-CM | POA: Diagnosis not present

## 2019-03-21 ENCOUNTER — Other Ambulatory Visit: Payer: Self-pay | Admitting: *Deleted

## 2019-03-21 DIAGNOSIS — Z170 Estrogen receptor positive status [ER+]: Secondary | ICD-10-CM | POA: Insufficient documentation

## 2019-03-21 DIAGNOSIS — C50212 Malignant neoplasm of upper-inner quadrant of left female breast: Secondary | ICD-10-CM

## 2019-03-22 ENCOUNTER — Encounter: Payer: Self-pay | Admitting: Hematology

## 2019-03-22 ENCOUNTER — Inpatient Hospital Stay: Payer: BC Managed Care – PPO | Attending: Hematology | Admitting: Hematology

## 2019-03-22 ENCOUNTER — Other Ambulatory Visit: Payer: Self-pay

## 2019-03-22 ENCOUNTER — Ambulatory Visit: Payer: BC Managed Care – PPO | Attending: Surgery | Admitting: Physical Therapy

## 2019-03-22 ENCOUNTER — Ambulatory Visit (HOSPITAL_BASED_OUTPATIENT_CLINIC_OR_DEPARTMENT_OTHER): Payer: BC Managed Care – PPO | Admitting: Licensed Clinical Social Worker

## 2019-03-22 ENCOUNTER — Encounter: Payer: Self-pay | Admitting: Physical Therapy

## 2019-03-22 ENCOUNTER — Ambulatory Visit: Payer: Self-pay | Admitting: Surgery

## 2019-03-22 ENCOUNTER — Inpatient Hospital Stay: Payer: BC Managed Care – PPO

## 2019-03-22 ENCOUNTER — Ambulatory Visit
Admission: RE | Admit: 2019-03-22 | Discharge: 2019-03-22 | Disposition: A | Discharge status: Home / Self Care | Payer: BC Managed Care – PPO | Source: Ambulatory Visit | Attending: Radiation Oncology | Admitting: Radiation Oncology

## 2019-03-22 ENCOUNTER — Encounter: Payer: Self-pay | Admitting: Licensed Clinical Social Worker

## 2019-03-22 VITALS — BP 139/84 | HR 74 | Temp 99.1°F | Resp 18 | Ht 62.0 in | Wt 129.9 lb

## 2019-03-22 DIAGNOSIS — Z79899 Other long term (current) drug therapy: Secondary | ICD-10-CM | POA: Insufficient documentation

## 2019-03-22 DIAGNOSIS — C50212 Malignant neoplasm of upper-inner quadrant of left female breast: Secondary | ICD-10-CM

## 2019-03-22 DIAGNOSIS — Z801 Family history of malignant neoplasm of trachea, bronchus and lung: Secondary | ICD-10-CM | POA: Insufficient documentation

## 2019-03-22 DIAGNOSIS — Z170 Estrogen receptor positive status [ER+]: Secondary | ICD-10-CM

## 2019-03-22 DIAGNOSIS — R293 Abnormal posture: Secondary | ICD-10-CM | POA: Diagnosis not present

## 2019-03-22 DIAGNOSIS — Z803 Family history of malignant neoplasm of breast: Secondary | ICD-10-CM | POA: Diagnosis not present

## 2019-03-22 DIAGNOSIS — C50912 Malignant neoplasm of unspecified site of left female breast: Secondary | ICD-10-CM

## 2019-03-22 DIAGNOSIS — D242 Benign neoplasm of left breast: Secondary | ICD-10-CM | POA: Diagnosis not present

## 2019-03-22 DIAGNOSIS — F419 Anxiety disorder, unspecified: Secondary | ICD-10-CM | POA: Diagnosis not present

## 2019-03-22 LAB — CMP (CANCER CENTER ONLY)
ALT: 15 U/L (ref 0–44)
AST: 18 U/L (ref 15–41)
Albumin: 4.9 g/dL (ref 3.5–5.0)
Alkaline Phosphatase: 52 U/L (ref 38–126)
Anion gap: 11 (ref 5–15)
BUN: 10 mg/dL (ref 6–20)
CO2: 26 mmol/L (ref 22–32)
Calcium: 9.6 mg/dL (ref 8.9–10.3)
Chloride: 105 mmol/L (ref 98–111)
Creatinine: 0.78 mg/dL (ref 0.44–1.00)
GFR, Est AFR Am: >60 mL/min (ref >60)
GFR, Estimated: >60 mL/min (ref >60)
Glucose, Bld: 101 mg/dL — ABNORMAL HIGH (ref 70–99)
Potassium: 3.3 mmol/L — ABNORMAL LOW (ref 3.5–5.1)
Sodium: 142 mmol/L (ref 135–145)
Total Bilirubin: 0.6 mg/dL (ref 0.3–1.2)
Total Protein: 7.9 g/dL (ref 6.5–8.1)

## 2019-03-22 LAB — CBC WITH DIFFERENTIAL (CANCER CENTER ONLY)
Abs Immature Granulocytes: 0.01 10*3/uL (ref 0.00–0.07)
Basophils Absolute: 0.1 10*3/uL (ref 0.0–0.1)
Basophils Relative: 1 %
Eosinophils Absolute: 0.2 10*3/uL (ref 0.0–0.5)
Eosinophils Relative: 3 %
HCT: 39.3 % (ref 36.0–46.0)
Hemoglobin: 13.4 g/dL (ref 12.0–15.0)
Immature Granulocytes: 0 %
Lymphocytes Relative: 34 %
Lymphs Abs: 2.5 10*3/uL (ref 0.7–4.0)
MCH: 31.4 pg (ref 26.0–34.0)
MCHC: 34.1 g/dL (ref 30.0–36.0)
MCV: 92 fL (ref 80.0–100.0)
Monocytes Absolute: 0.4 10*3/uL (ref 0.1–1.0)
Monocytes Relative: 6 %
Neutro Abs: 4.1 10*3/uL (ref 1.7–7.7)
Neutrophils Relative %: 56 %
Platelet Count: 343 10*3/uL (ref 150–400)
RBC: 4.27 MIL/uL (ref 3.87–5.11)
RDW: 12.4 % (ref 11.5–15.5)
WBC Count: 7.4 10*3/uL (ref 4.0–10.5)
nRBC: 0 % (ref 0.0–0.2)

## 2019-03-22 NOTE — Progress Notes (Signed)
Called patient referred by navigator to introduce myself as Arboriculturist and to address concerns.  Patient had concerns regarding surgery, treatment costs;etc. Advised patient she may want to contact her insurance company directly to obtain information regarding deductible and OOP amounts and how her coverage is set up. Also advised once surgery has been scheduled, the pre-service center will contact her regarding surgery cost and estimated OOP prior to surgery. She may then communicate with them that she would like to set up arrangements, however this may not be an option for the surgeon office and would need to speak with them directly to discuss options for their costs.  Advised once her treatment plan is in place as far as chemotherapy, I will reach out to her to discuss available options such as available copay assistance which varies for treatment and the [employer]. She verbalized understanding.  Gave her my direct contact name and number for any additional financial questions or concerns.

## 2019-03-22 NOTE — H&P (View-Only) (Signed)
Chelsea Hobbs Documented: 03/22/2019 7:25 AM Location: [facility] Patient #: [phone] DOB: [birth year] Married / Language: English / Race: White Female  History of Present Illness Chelsea Hobbs A. 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 Pt denies mass, discharge or change in appearance of breasts. No pain or problem . Mother recently diagnosed with breast cancer.         CLINICAL DATA: Screening recall for a possible mass and possible asymmetry in the left breast. EXAM: DIGITAL DIAGNOSTIC LEFT MAMMOGRAM WITH CAD AND TOMO ULTRASOUND LEFT BREAST COMPARISON: Previous exam(s). ACR Breast Density Category c: The breast tissue is heterogeneously dense, which may obscure small masses. FINDINGS: On the diagnostic spot-compression images, the asymmetry, which projects in the posterior, medial aspect of the left breast, persists. It has irregular margins suspicious for an irregular mass. It measures 9 mm in greatest dimension. The other lesion noted on the screening study is not well-defined on the CC diagnostic spot-compression imaging, but is visualized on the spot-compression MLO image has a circumscribed oval mass, lower aspect of the left breast, measuring 1 cm in size. No other abnormalities. Mammographic images were processed with CAD. On physical exam, no discrete mass is palpated in the medial or lower aspect of the left breast. Targeted ultrasound is performed, showing a hypoechoic, irregular mass with partly ill-defined margins in the posterior aspect of the left breast at 10 o'clock, 3 cm the nipple, measuring 12 x 7 x 10 mm. This is consistent in size, shape and location to the irregular mass seen mammographically. In the 5:30 o'clock position of the left breast, 5 cm the nipple, superficial depth, there is an oval, circumscribed hypoechoic mass measuring 9 x 6 x 7 mm, consistent  with the inferior mass seen mammographically. Sonographic evaluation of the left axilla shows no enlarged or abnormal lymph nodes. IMPRESSION: 1. Suspicious mass in the 10 o'clock position of the left breast. Biopsy is indicated. 2. Indeterminate mass in the 5:30 o'clock position of the left breast. Although this may reflect a fibroadenoma, tissue sampling is recommended. RECOMMENDATION: 1. Ultrasound-guided core needle biopsy of the mass in the posterior left breast at 10 o'clock, 3 cm the nipple. 2. Ultrasound-guided core needle biopsy of the more superficial and more benign-appearing mass in the 5:30 o'clock position of the left breast. I have discussed the findings and recommendations with the patient. Results were also provided in writing at the conclusion of the visit. If applicable, a reminder letter will be sent to the patient regarding the next appointment. BI-RADS CATEGORY 4: Suspicious. Electronically Signed By: Lajean Manes M.D. On: 03/10/2019 15:52  Result History  MM DIAG BREAST TOMO UNI LEFT (Order #[id number]) on 03/10/2019 - Order Result History Report <[email or web address]>  US BREAST LTD UNI LEFT INC AXILLA (Order [id number]) - Reflex for Order [id number] Study Result  CLINICAL DATA: Screening recall for a possible mass and possible asymmetry in the left breast. EXAM: DIGITAL DIAGNOSTIC LEFT MAMMOGRAM WITH CAD AND TOMO ULTRASOUND LEFT BREAST COMPARISON: Previous exam(s). ACR Breast Density Category c: The breast tissue is heterogeneously dense, which may obscure small masses. FINDINGS: On the diagnostic spot-compression images, the asymmetry, which projects in the posterior, medial aspect of the left breast, persists. It has irregular margins suspicious for an irregular mass. It measures 9 mm in greatest dimension. The other lesion noted on the screening study is not well-defined on the CC diagnostic spot-compression  imaging, but is visualized on the  spot-compression MLO image has a circumscribed oval mass, lower aspect of the left breast, measuring 1 cm in size. No other abnormalities. Mammographic images were processed with CAD. On physical exam, no discrete mass is palpated in the medial or lower aspect of the left breast. Targeted ultrasound is performed, showing a hypoechoic, irregular mass with partly ill-defined margins in the posterior aspect of the left breast at 10 o'clock, 3 cm the nipple, measuring 12 x 7 x 10 mm. This is consistent in size, shape and location to the irregular mass seen mammographically. In the 5:30 o'clock position of the left breast, 5 cm the nipple, superficial depth, there is an oval, circumscribed hypoechoic mass measuring 9 x 6 x 7 mm, consistent with the inferior mass seen mammographically. Sonographic evaluation of the left axilla shows no enlarged or abnormal lymph nodes. IMPRESSION: 1. Suspicious mass in the 10 o'clock position of the left breast. Biopsy is indicated. 2. Indeterminate mass in the 5:30 o'clock position of the left breast. Although this may reflect a fibroadenoma, tissue sampling is recommended. RECOMMENDATION: 1. Ultrasound-guided core needle biopsy of the mass in the posterior left breast at 10 o'clock, 3 cm the nipple. 2. Ultrasound-guided core needle biopsy of the more superficial and more benign-appearing mass in the 5:30 o'clock position of the left breast. I have discussed the findings and recommendations with the patient. Results were also provided in writing at the conclusion of the visit. If applicable, a reminder letter will be sent to the patient regarding the next appointment. BI-RADS CATEGORY 4: Suspicious. Electronically Signed By: Lajean Manes M.D.  ADDITIONAL INFORMATION: 1. PROGNOSTIC INDICATORS Results: IMMUNOHISTOCHEMICAL AND MORPHOMETRIC ANALYSIS PERFORMED MANUALLY The tumor cells are POSITIVE for Her2 (3+). Estrogen Receptor: 100%, POSITIVE, STRONG  STAINING INTENSITY Progesterone Receptor: 100%, POSITIVE, STRONG STAINING INTENSITY Proliferation Marker Ki67: 60% REFERENCE RANGE ESTROGEN RECEPTOR NEGATIVE 0% POSITIVE =>1% REFERENCE RANGE PROGESTERONE RECEPTOR NEGATIVE 0% POSITIVE =>1% All controls stained appropriately Enid Cutter MD Pathologist, Electronic Signature ( Signed 03/21/2019) FINAL DIAGNOSIS Diagnosis 1. 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 SEE NOTE 2. Breast, left, needle core biopsy, 5:30 o'clock - FIBROADENOMA 1 of 3 FINAL for SHELINA, LUO VINCENT [phone]) Diagnosis Note 1. Carcinoma measures 0.7 cm in greatest linear dimension. A breast prognostic profile is pending and will be reported in an addendum. Dr. Jeannie Done has reviewed the case and concurs with the above diagnosis. The [facility] was notified on 03/17/2019. Jaquita Folds MD Pathologist, Electronic Signature (Case signed 03/17/2019) Specimen Gross and Clinical Information Specimen Comment 1. Time in formalin: 1320; cold ischemia time: 5 seconds 2. Time in formalin: 1330; cold ischemia time is 5 seconds Specimen(s) Obtained: 1. Breast, left, needle core biopsy, 10 o'clock 2. Breast, left, needle core biopsy, 5:30 o'clock Specimen Clinical Information 1. Mass C/F malignancy vs FA/papilloma 2. Mass - FA/papilloma, R/O malignancy Gross 1. Received in formalin (TIF 1:20pm, CIT ?) labeled with the patient's name and "Left breast 10:00" are five cores of tan yellow soft tissue ranging fom 0.7 to 2.2 cm, entirely submitted in one cassette. (AK 03/16/2019) 2. Received in formalin (TIF 1:30pm, CIT ?) labeled with the patient's name and "Left breast 5:30" are two cores of tan yellow soft tissue ranging from 1.6 to 1.8 cm, entirely submitted in one cassette. (AK 03/16/2019) Stain(s) used in Diagnosis: The following stain(s) were used in diagnosing the case: ER-ACIS, PR-ACIS, Her2 by  IHC, KI-67-ACIS. The  control(s) stained appropriately. Disclaimer Estrogen receptor (6F11), immunohistochemical stains are performed on formalin fixed, paraffin embedded tissue using a 3,3"-diaminobenzidine (DAB) chromogen and Leica Bond Autostainer System. The staining intensity of the nucleus is scored manually and is reported as the percentage of tumor cell nuclei demonstrating specific nuclear staining.Specimens are fixed in 10% Neutral Buffered Formalin for at least 6 hours and up to 72 hours. These tests have not be validated on decalcified tissue. Results should be interpreted with caution given the possibility of false negative results on decalcified specimens. IHC scores are reported using ASCO/CAP scoring criteria. An IHC Score of 0 or 1+ is NEGATIVE for HER2, 3+ is POSITIVE for HER2, and 2+ is EQUIVOCAL. Equivocal results are reflexed to either FISH or IHC testing. Specimens are fixed in 10% Neutral Buffered Formalin for at least 6 hours and up to 72 hours. These tests have not be validated on decalcified tissue. Results should be interpreted with caution given the possibility of false negative results on decalcified specimens. Ki-67 (MM1), immunohistochemical stains are performed on formalin fixed, paraffin embedded tissue using a 3,3"-diaminobenzidine (DAB) chromogen and Leica Bond Autostainer System. The staining intensity of the nucleus is scored manually and is reported as the percentage of tumor cell nuclei demonstrating specific nuclear staining.Specimens are fixed in 10% Neutral Buffered Formalin for at least 6 hours and up to 72 hours. These tests have not be validated on decalcified.  The patient is a 48 year old female.   Past Surgical History Tawni Pummel, RN; 03/22/2019 7:26 AM) Breast Biopsy Left. Oral Surgery  Diagnostic Studies History Tawni Pummel, RN; 03/22/2019 7:26 AM) Colonoscopy within last year Mammogram within last year Pap Smear 1-5 years  ago  Medication History Tawni Pummel, RN; 03/22/2019 7:26 AM) Medications Reconciled  Social History Tawni Pummel, RN; 03/22/2019 7:26 AM) Alcohol use Moderate alcohol use. Caffeine use Coffee. No drug use Tobacco use Never smoker.  Family History Tawni Pummel, RN; 03/22/2019 7:26 AM) Breast Cancer Family Members In General, Mother. Cancer Family Members In General. Cerebrovascular Accident Family Members In General. Hypertension Mother. Respiratory Condition Family Members In General.  Pregnancy / Birth History Tawni Pummel, RN; 03/22/2019 7:26 AM) Age at menarche 26 years. Contraceptive History Intrauterine device. Gravida 2 Irregular periods Length (months) of breastfeeding 3-6 Maternal age 28-30 Para 2  Other Problems Tawni Pummel, RN; 03/22/2019 7:26 AM) Kidney Stone Other disease, cancer, significant illness     Review of Systems Sunday Spillers Ledford RN; 03/22/2019 7:26 AM) General Not Present- Appetite Loss, Chills, Fatigue, Fever, Night Sweats, Weight Gain and Weight Loss. Skin Not Present- Change in Wart/Mole, Dryness, Hives, Jaundice, New Lesions, Non-Healing Wounds, Rash and Ulcer. HEENT Present- Seasonal Allergies. Not Present- Earache, Hearing Loss, Hoarseness, Nose Bleed, Oral Ulcers, Ringing in the Ears, Sinus Pain, Sore Throat, Visual Disturbances, Wears glasses/contact lenses and Yellow Eyes. Respiratory Not Present- Bloody sputum, Chronic Cough, Difficulty Breathing, Snoring and Wheezing. Breast Not Present- Breast Mass, Breast Pain, Nipple Discharge and Skin Changes. Cardiovascular Not Present- Chest Pain, Difficulty Breathing Lying Down, Leg Cramps, Palpitations, Rapid Heart Rate, Shortness of Breath and Swelling of Extremities. Gastrointestinal Not Present- Abdominal Pain, Bloating, Bloody Stool, Change in Bowel Habits, Chronic diarrhea, Constipation, Difficulty Swallowing, Excessive gas, Gets full quickly at meals, Hemorrhoids,  Indigestion, Nausea, Rectal Pain and Vomiting. Female Genitourinary Not Present- Frequency, Nocturia, Painful Urination, Pelvic Pain and Urgency. Musculoskeletal Not Present- Back Pain, Joint Pain, Joint Stiffness, Muscle Pain, Muscle Weakness and Swelling of Extremities. Neurological Not Present- Decreased Memory, Fainting, Headaches,  Numbness, Seizures, Tingling, Tremor, Trouble walking and Weakness. Psychiatric Not Present- Anxiety, Bipolar, Change in Sleep Pattern, Depression, Fearful and Frequent crying. Endocrine Not Present- Cold Intolerance, Excessive Hunger, Hair Changes, Heat Intolerance, Hot flashes and New Diabetes. Hematology Not Present- Blood Thinners, Easy Bruising, Excessive bleeding, Gland problems, HIV and Persistent Infections.   Physical Exam (Rashema Seawright A. Novia Lansberry MD; 03/22/2019 3:03 PM)  General Mental Status-Alert. General Appearance-Consistent with stated age. Hydration-Well hydrated. Voice-Normal.  Chest and Lung Exam Chest and lung exam reveals -quiet, even and easy respiratory effort with no use of accessory muscles and on auscultation, normal breath sounds, no adventitious sounds and normal vocal resonance. Inspection Chest Wall - Normal. Back - normal.  Breast Note: bruising left breast no masses bilaterally no discharge bilaterally  Cardiovascular Cardiovascular examination reveals -normal heart sounds, regular rate and rhythm with no murmurs and normal pedal pulses bilaterally.  Neurologic Neurologic evaluation reveals -alert and oriented x 3 with no impairment of recent or remote memory. Mental Status-Normal.  Musculoskeletal Normal Exam - Left-Upper Extremity Strength Normal and Lower Extremity Strength Normal. Normal Exam - Right-Upper Extremity Strength Normal and Lower Extremity Strength Normal.  Lymphatic Head & Neck  General Head & Neck Lymphatics: Bilateral - Description - Normal. Axillary  General Axillary Region:  Bilateral - Description - Normal. Tenderness - Non Tender.    Assessment & Plan (Will Heinkel A. Majesty Oehlert MD; 03/22/2019 3:05 PM)  BREAST CANCER, LEFT (C50.912) Impression: left breast lumpectomy with SLN mapping and port placement Risk of sentinel lymph node mapping include bleeding, infection, lymphedema, shoulder pain. stiffness, dye allergy. cosmetic deformity , blood clots, death, need for more surgery. Pt agrees to proceed. Risk of lumpectomy include bleeding, infection, seroma, more surgery, use of seed/wire, wound care, cosmetic deformity and the need for other treatments, death , blood clots, death. Pt agrees to proceed. Pt requires port placement for chemotherapy. Risk include bleeding, infection, pneumothorax, hemothorax, mediastinal injury, nerve injury , blood vessel injury, strke, blood clots, death, migration. embolization and need for additional procedures. Pt agrees to proceed.  Current Plans You are being scheduled for surgery- Our schedulers will call you.  You should hear from our office's scheduling department within 5 working days about the location, date, and time of surgery. We try to make accommodations for patient's preferences in scheduling surgery, but sometimes the OR schedule or the surgeon's schedule prevents [place] from making those accommodations.  If you have not heard from our office [phone]) in 5 working days, call the office and ask for your surgeon's nurse.  If you have other questions about your diagnosis, plan, or surgery, call the office and ask for your surgeon's nurse.  Pt Education - [facility] Breast Cancer Information Given - [facility] "Breast Journey" Package We discussed the staging and pathophysiology of breast cancer. We discussed all of the different options for treatment for breast cancer including surgery, chemotherapy, radiation therapy, Herceptin, and antiestrogen therapy. We discussed a sentinel lymph node biopsy as she does not appear to having lymph  node involvement right now. We discussed the performance of that with injection of radioactive tracer and blue dye. We discussed that she would have an incision underneath her axillary hairline. We discussed that there is a bout a 10-20% chance of having a positive node with a sentinel lymph node biopsy and we will await the permanent pathology to make any other first further decisions in terms of her treatment. One of these options might be to return to the operating room to perform  an axillary lymph node dissection. We discussed about a 1-2% risk lifetime of chronic shoulder pain as well as lymphedema associated with a sentinel lymph node biopsy. We discussed the options for treatment of the breast cancer which included lumpectomy versus a mastectomy. We discussed the performance of the lumpectomy with a wire placement. We discussed a 10-20% chance of a positive margin requiring reexcision in the operating room. We also discussed that she may need radiation therapy or antiestrogen therapy or both if she undergoes lumpectomy. We discussed the mastectomy and the postoperative care for that as well. We discussed that there is no difference in her survival whether she undergoes lumpectomy with radiation therapy or antiestrogen therapy versus a mastectomy. There is a slight difference in the local recurrence rate being 3-5% with lumpectomy and about 1% with a mastectomy. We discussed the risks of operation including bleeding, infection, possible reoperation. She understands her further therapy will be based on what her stages at the time of her operation.  Pt Education - [facility] breast cancer surgery: discussed with patient and provided information. Pt Education - [facility] Breast Biopsy HCI: discussed with patient and provided information. Use of a central venous catheter for intravenous therapy was discussed. Technique of catheter placement using ultrasound and fluoroscopy guidance was discussed. Risks such as  bleeding, infection, pneumothorax, catheter occlusion, reoperation, and other risks were discussed. I noted a good likelihood this will help address the problem. Questions were answered. The patient expressed understanding & wishes to proceed.

## 2019-03-22 NOTE — Progress Notes (Signed)
Radiation Oncology         ([phone]) [phone] ________________________________  Multidisciplinary Breast Oncology Clinic [facility]) Initial Outpatient Consultation  Name: Chelsea Hobbs MRN: [id number]  Date: 03/22/2019  DOB: 23-Dec-1970  YM:EBRAXE, Denton Ar, MD  Erroll Luna, MD   REFERRING PHYSICIAN: Erroll Luna, MD  DIAGNOSIS: The encounter diagnosis was Malignant neoplasm of upper-inner quadrant of left breast in female, estrogen receptor positive ([place]).  Stage T1c Left Breast UIQ, Invasive Ductal Carcinoma, ER+ / PR+ / Her2+, Grade 3    ICD-10-CM   1. Malignant neoplasm of upper-inner quadrant of left breast in female, estrogen receptor positive ([place])  C50.212    Z17.0     HISTORY OF PRESENT ILLNESS::Chelsea Hobbs is a 48 y.o. female who is presenting to the office today for evaluation of her newly diagnosed breast cancer. She is unaccompanied due to pandemic visitor restrictions. She is doing well overall.   She had routine screening mammography on 03/06/2019 showing a possible abnormality in the left breast. She underwent left diagnostic mammography with tomography ultrasonography at [facility] on 03/10/2019 showing: suspicious 12 mm mass at 10 o'clock in the left breast; indeterminate 9 mm mass at 5:30 in the left breast.  Biopsy on 03/16/2019 showed: invasive ductal carcinoma, grade 3. Prognostic indicators significant for: estrogen receptor, 100% positive and progesterone receptor, 100% positive, both with strong staining intensity. Proliferation marker Ki67 at 60%. HER2 positive.  Menarche: 48 years old Age at first live birth: 48 years old GP: 2 LMP: IUD in place Contraceptive: yes, pill for 10 years from age 40 to 72, shot while breastfeeding. HRT: n/a   The patient was referred today for presentation in the multidisciplinary conference.  Radiology studies and pathology slides were presented there for review and discussion of treatment options.  A consensus  was discussed regarding potential next steps.  Of note, the patient's mother was also diagnosed with breast cancer. Per patient, her mother will start radiation treatments on Monday, 03/27/2019.  PREVIOUS RADIATION THERAPY: No  PAST MEDICAL HISTORY:  Past Medical History:  Diagnosis Date   Asthma    Family history of breast cancer    Family history of lung cancer     PAST SURGICAL HISTORY: Past Surgical History:  Procedure Laterality Date   BREAST BIOPSY Right    NASAL SINUS SURGERY      FAMILY HISTORY:  Family History  Problem Relation Age of Onset   Breast cancer Mother 34   Cancer Maternal Grandmother 56       lung cancer   Cancer Maternal Grandfather 77       lung cancer   Breast cancer Paternal Grandmother 32    SOCIAL HISTORY:  Social History   Socioeconomic History   Marital status: Married    Spouse name: Not on file   Number of children: 2   Years of education: Not on file   Highest education level: Not on file  Occupational History   Occupation: Arboriculturist   Social Needs   Financial resource strain: Not on file   Food insecurity    Worry: Not on file    Inability: Not on file   Transportation needs    Medical: Not on file    Non-medical: Not on file  Tobacco Use   Smoking status: Never Smoker   Smokeless tobacco: Never Used  Substance and Sexual Activity   Alcohol use: Yes    Alcohol/week: 8.0 - 10.0 standard drinks    Types: 8 -  10 Cans of beer per week    Comment: occasionally    Drug use: No   Sexual activity: Not on file  Lifestyle   Physical activity    Days per week: Not on file    Minutes per session: Not on file   Stress: Not on file  Relationships   Social connections    Talks on phone: Not on file    Gets together: Not on file    Attends religious service: Not on file    Active member of club or organization: Not on file    Attends meetings of clubs or organizations: Not on file    Relationship  status: Not on file  Other Topics Concern   Not on file  Social History Narrative   Not on file    ALLERGIES:  Allergies  Allergen Reactions   Doxycycline Diarrhea   Latex Dermatitis    MEDICATIONS:  Current Outpatient Medications  Medication Sig Dispense Refill   albuterol (PROVENTIL HFA;VENTOLIN HFA) 108 (90 BASE) MCG/ACT inhaler Inhale 2 puffs into the lungs every 6 (six) hours as needed. For shortness of breath     B Complex Vitamins (VITAMIN-B COMPLEX) TABS Take 1 tablet by mouth daily.     cetirizine (ZYRTEC) 10 MG tablet Take 10 mg by mouth daily as needed for allergies.     fexofenadine-pseudoephedrine (ALLEGRA-D) 60-120 MG per tablet Take 1 tablet by mouth daily as needed (congestion).      HYDROcodone-acetaminophen (NORCO/VICODIN) 5-325 MG per tablet Take 1 tablet by mouth every 6 (six) hours as needed for moderate pain.     ibuprofen (ADVIL,MOTRIN) 200 MG tablet Take 400 mg by mouth every 6 (six) hours as needed. For headaches     ketorolac (TORADOL) 10 MG tablet Take 1 tablet (10 mg total) by mouth every 6 (six) hours. 10 tablet 0   Multiple Vitamin (MULTIVITAMIN WITH MINERALS) TABS Take 1 tablet by mouth daily.     Omega-3 Fatty Acids (FISH OIL PO) Take 1 tablet by mouth daily.     ondansetron (ZOFRAN ODT) 4 MG disintegrating tablet Take 1 tablet (4 mg total) by mouth every 8 (eight) hours as needed for nausea or vomiting. 14 tablet 0   oxyCODONE-acetaminophen (PERCOCET/ROXICET) 5-325 MG per tablet Take 1 or 2 po Q 6hrs for pain 30 tablet 0   tamsulosin (FLOMAX) 0.4 MG CAPS capsule Once daily until you pass the kidney stone 10 capsule 0   No current facility-administered medications for this encounter.     REVIEW OF SYSTEMS: A 10+ POINT REVIEW OF SYSTEMS WAS OBTAINED including neurology, dermatology, psychiatry, cardiac, respiratory, lymph, extremities, GI, GU, musculoskeletal, constitutional, reproductive, HEENT. On the provided form, she is without  complaints today. She denies any pain, ENT issues, heart or lung problems, urinary issues, back or joint pain, and any other symptoms.    PHYSICAL EXAM:  Vitals with BMI 03/22/2019  Height '5\' 2"'   Weight 129 lbs 14 oz  BMI 80.99  Systolic 833  Diastolic 84  Pulse 74  Lungs are clear to auscultation bilaterally. Heart has regular rate and rhythm. No palpable cervical, supraclavicular, or axillary adenopathy. Abdomen soft, non-tender, normal bowel sounds. Right breast with large palpable fibroma in the 6 o'clock position, approximately 2 cm in size (previously biopsied), nipple discharge, or bleeding.  Left breast with bruising in the upper-inner aspect, some biopsy-appropriate induration, no definite palpable mass, no nipple discharge or bleeding.   ECOG = 1  0 - Asymptomatic (Fully active,  able to carry on all predisease activities without restriction)  1 - Symptomatic but completely ambulatory (Restricted in physically strenuous activity but ambulatory and able to carry out work of a light or sedentary nature. For example, light housework, office work)  2 - Symptomatic, <50% in bed during the day (Ambulatory and capable of all self care but unable to carry out any work activities. Up and about more than 50% of waking hours)  3 - Symptomatic, >50% in bed, but not bedbound (Capable of only limited self-care, confined to bed or chair 50% or more of waking hours)  4 - Bedbound (Completely disabled. Cannot carry on any self-care. Totally confined to bed or chair)  5 - Death   Eustace Pen MM, [place]ch RH, Tormey DC, et al. [phone]). "Toxicity and response criteria of the [facility]". [place] Oncol. 5 (6): 649-55  LABORATORY DATA:  Lab Results  Component Value Date   WBC 7.4 03/22/2019   HGB 13.4 03/22/2019   HCT 39.3 03/22/2019   MCV 92.0 03/22/2019   PLT 343 03/22/2019   Lab Results  Component Value Date   NA 142 03/22/2019   K 3.3 (L) 03/22/2019   CL 105  03/22/2019   CO2 26 03/22/2019   Lab Results  Component Value Date   ALT 15 03/22/2019   AST 18 03/22/2019   ALKPHOS 52 03/22/2019   BILITOT 0.6 03/22/2019    PULMONARY FUNCTION TEST:   Recent Review Flowsheet Data    There is no flowsheet data to display.      RADIOGRAPHY: US Breast Ltd Uni Left Inc Axilla  Result Date: 03/10/2019 CLINICAL DATA:  Screening recall for a possible mass and possible asymmetry in the left breast. EXAM: DIGITAL DIAGNOSTIC LEFT MAMMOGRAM WITH CAD AND TOMO ULTRASOUND LEFT BREAST COMPARISON:  Previous exam(s). ACR Breast Density Category c: The breast tissue is heterogeneously dense, which may obscure small masses. FINDINGS: On the diagnostic spot-compression images, the asymmetry, which projects in the posterior, medial aspect of the left breast, persists. It has irregular margins suspicious for an irregular mass. It measures 9 mm in greatest dimension. The other lesion noted on the screening study is not well-defined on the CC diagnostic spot-compression imaging, but is visualized on the spot-compression MLO image has a circumscribed oval mass, lower aspect of the left breast, measuring 1 cm in size. No other abnormalities. Mammographic images were processed with CAD. On physical exam, no discrete mass is palpated in the medial or lower aspect of the left breast. Targeted ultrasound is performed, showing a hypoechoic, irregular mass with partly ill-defined margins in the posterior aspect of the left breast at 10 o'clock, 3 cm the nipple, measuring 12 x 7 x 10 mm. This is consistent in size, shape and location to the irregular mass seen mammographically. In the 5:30 o'clock position of the left breast, 5 cm the nipple, superficial depth, there is an oval, circumscribed hypoechoic mass measuring 9 x 6 x 7 mm, consistent with the inferior mass seen mammographically. Sonographic evaluation of the left axilla shows no enlarged or abnormal lymph nodes. IMPRESSION: 1.  Suspicious mass in the 10 o'clock position of the left breast. Biopsy is indicated. 2. Indeterminate mass in the 5:30 o'clock position of the left breast. Although this may reflect a fibroadenoma, tissue sampling is recommended. RECOMMENDATION: 1. Ultrasound-guided core needle biopsy of the mass in the posterior left breast at 10 o'clock, 3 cm the nipple. 2. Ultrasound-guided core needle biopsy of the more  superficial and more benign-appearing mass in the 5:30 o'clock position of the left breast. I have discussed the findings and recommendations with the patient. Results were also provided in writing at the conclusion of the visit. If applicable, a reminder letter will be sent to the patient regarding the next appointment. BI-RADS CATEGORY  4: Suspicious. Electronically Signed   By: Lajean Manes M.D.   On: 03/10/2019 15:52   Mm Diag Breast Tomo Uni Left  Result Date: 03/10/2019 CLINICAL DATA:  Screening recall for a possible mass and possible asymmetry in the left breast. EXAM: DIGITAL DIAGNOSTIC LEFT MAMMOGRAM WITH CAD AND TOMO ULTRASOUND LEFT BREAST COMPARISON:  Previous exam(s). ACR Breast Density Category c: The breast tissue is heterogeneously dense, which may obscure small masses. FINDINGS: On the diagnostic spot-compression images, the asymmetry, which projects in the posterior, medial aspect of the left breast, persists. It has irregular margins suspicious for an irregular mass. It measures 9 mm in greatest dimension. The other lesion noted on the screening study is not well-defined on the CC diagnostic spot-compression imaging, but is visualized on the spot-compression MLO image has a circumscribed oval mass, lower aspect of the left breast, measuring 1 cm in size. No other abnormalities. Mammographic images were processed with CAD. On physical exam, no discrete mass is palpated in the medial or lower aspect of the left breast. Targeted ultrasound is performed, showing a hypoechoic, irregular mass with  partly ill-defined margins in the posterior aspect of the left breast at 10 o'clock, 3 cm the nipple, measuring 12 x 7 x 10 mm. This is consistent in size, shape and location to the irregular mass seen mammographically. In the 5:30 o'clock position of the left breast, 5 cm the nipple, superficial depth, there is an oval, circumscribed hypoechoic mass measuring 9 x 6 x 7 mm, consistent with the inferior mass seen mammographically. Sonographic evaluation of the left axilla shows no enlarged or abnormal lymph nodes. IMPRESSION: 1. Suspicious mass in the 10 o'clock position of the left breast. Biopsy is indicated. 2. Indeterminate mass in the 5:30 o'clock position of the left breast. Although this may reflect a fibroadenoma, tissue sampling is recommended. RECOMMENDATION: 1. Ultrasound-guided core needle biopsy of the mass in the posterior left breast at 10 o'clock, 3 cm the nipple. 2. Ultrasound-guided core needle biopsy of the more superficial and more benign-appearing mass in the 5:30 o'clock position of the left breast. I have discussed the findings and recommendations with the patient. Results were also provided in writing at the conclusion of the visit. If applicable, a reminder letter will be sent to the patient regarding the next appointment. BI-RADS CATEGORY  4: Suspicious. Electronically Signed   By: Lajean Manes M.D.   On: 03/10/2019 15:52   Mm 3d Screen Breast Bilateral  Result Date: 03/07/2019 CLINICAL DATA:  Screening. EXAM: DIGITAL SCREENING BILATERAL MAMMOGRAM WITH TOMO AND CAD COMPARISON:  Previous exam(s). ACR Breast Density Category c: The breast tissue is heterogeneously dense, which may obscure small masses. FINDINGS: In the left breast, a possible mass warrants further evaluation. There is also a possible asymmetry in the slightly medial left breast seen on the CC view. In the right breast, no findings suspicious for malignancy. Images were processed with CAD. IMPRESSION: Further evaluation is  suggested for possible mass and possible asymmetry in the left breast. RECOMMENDATION: Diagnostic mammogram and possibly ultrasound of the left breast. (Code:[id number]-[id number]) The patient will be contacted regarding the findings, and additional imaging will be scheduled. BI-RADS CATEGORY  0: Incomplete. Need additional imaging evaluation and/or prior mammograms for comparison. Electronically Signed   By: Audie Pinto M.D.   On: 03/07/2019 17:48   Mm Clip Placement Left  Result Date: 03/16/2019 CLINICAL DATA:  Post ultrasound-guided biopsy of a mass in the left breast at the 10 o'clock position and ultrasound-guided biopsy of a mass in the left breast at the 5:30 position. EXAM: DIAGNOSTIC LEFT MAMMOGRAM POST ULTRASOUND BIOPSY COMPARISON:  Previous exam(s). FINDINGS: Mammographic images were obtained following ultrasound guided biopsy of a mass in the left breast at the 10 o'clock position and ultrasound-guided biopsy of a mass in the left breast at the 5:30 position. A ribbon shaped biopsy marking clip is present at the site of the biopsied mass in the left breast at the 10 o'clock position. A coil shaped biopsy marking clip is present at the site of the biopsied mass in the left breast at the 5:30 position. IMPRESSION: 1. Ribbon shaped biopsy marking clip at site of biopsied mass in the left breast at the 10 o'clock position. 2. Coil shaped biopsy marking clip at site of biopsied mass in the left breast at the 5:30 position. Final Assessment: Post Procedure Mammograms for Marker Placement Electronically Signed   By: Everlean Alstrom M.D.   On: 03/16/2019 13:51   [place] Lt Breast Bx W Loc Dev 1st Lesion Img Bx Spec US Guide  Addendum Date: 03/21/2019   ADDENDUM REPORT: 03/21/2019 07:03 ADDENDUM: Pathology revealed GRADE III INVASIVE DUCTAL CARCINOMA of the Left breast, 10 o'clock. This was found to be concordant by Dr. Everlean Alstrom. Pathology revealed FIBROADENOMA of the Left breast, 5:30 o'clock. This was found  to be concordant by Dr. Everlean Alstrom. Pathology results were discussed with the patient by telephone. The patient reported doing well after the biopsies with tenderness at the sites. Post biopsy instructions and care were reviewed and questions were answered. The patient was encouraged to call [facility] for any additional concerns. The patient was referred to [facility] on March 22, 2019. Pathology results reported by Terie Purser, RN on 03/21/2019. Electronically Signed   By: Everlean Alstrom M.D.   On: 03/21/2019 07:03   Result Date: 03/21/2019 CLINICAL DATA:  48 year old female with suspicious mass in the left breast at the 10 o'clock position an indeterminate mass in the left breast at the 5:30 position. EXAM: ULTRASOUND GUIDED LEFT BREAST CORE NEEDLE BIOPSY COMPARISON:  Previous exam(s). FINDINGS: I met with the patient and we discussed the procedure of ultrasound-guided biopsy, including benefits and alternatives. We discussed the high likelihood of a successful procedure. We discussed the risks of the procedure, including infection, bleeding, tissue injury, clip migration, and inadequate sampling. Informed written consent was given. The usual time-out protocol was performed immediately prior to the procedure. SITE 1: LEFT BREAST 10 O'CLOCK: MASS: Lesion quadrant: UPPER INNER Using sterile technique and 1% Lidocaine as local anesthetic, under direct ultrasound visualization, a 12 gauge spring-loaded device was used to perform biopsy of the mass in the left breast at the 10 o'clock position using a medial to lateral approach. At the conclusion of the procedure a ribbon shaped tissue marker clip was deployed into the biopsy cavity. Follow up 2 view mammogram was performed and dictated separately. SITE 2: LEFT BREAST 5:30 POSITION: Lesion quadrant: LOWER OUTER Using sterile technique and 1% Lidocaine as  local anesthetic, under direct ultrasound visualization, a 12 gauge spring-loaded device was  used to perform biopsy of the mass in the left breast at the 5:30 position using a lateral to medial approach. At the conclusion of the procedure a coil shaped tissue marker clip was deployed into the biopsy cavity. Follow up 2 view mammogram was performed and dictated separately. IMPRESSION: 1. Ultrasound-guided biopsy of the mass in the left breast at the 10 o'clock position, at site of ribbon shaped biopsy marking clip. 2. Ultrasound-guided biopsy of the mass in the left breast at 5:30 position, at site of coil shaped biopsy marking clip. Electronically Signed: By: Everlean Alstrom M.D. On: 03/16/2019 13:42   [place] Lt Breast Bx W Loc Dev Ea Add Lesion Img Bx Spec US Guide  Addendum Date: 03/21/2019   ADDENDUM REPORT: 03/21/2019 07:03 ADDENDUM: Pathology revealed GRADE III INVASIVE DUCTAL CARCINOMA of the Left breast, 10 o'clock. This was found to be concordant by Dr. Everlean Alstrom. Pathology revealed FIBROADENOMA of the Left breast, 5:30 o'clock. This was found to be concordant by Dr. Everlean Alstrom. Pathology results were discussed with the patient by telephone. The patient reported doing well after the biopsies with tenderness at the sites. Post biopsy instructions and care were reviewed and questions were answered. The patient was encouraged to call [facility] for any additional concerns. The patient was referred to [facility] on March 22, 2019. Pathology results reported by Terie Purser, RN on 03/21/2019. Electronically Signed   By: Everlean Alstrom M.D.   On: 03/21/2019 07:03   Result Date: 03/21/2019 CLINICAL DATA:  48 year old female with suspicious mass in the left breast at the 10 o'clock position an indeterminate mass in the left breast at the 5:30 position. EXAM: ULTRASOUND GUIDED LEFT BREAST CORE  NEEDLE BIOPSY COMPARISON:  Previous exam(s). FINDINGS: I met with the patient and we discussed the procedure of ultrasound-guided biopsy, including benefits and alternatives. We discussed the high likelihood of a successful procedure. We discussed the risks of the procedure, including infection, bleeding, tissue injury, clip migration, and inadequate sampling. Informed written consent was given. The usual time-out protocol was performed immediately prior to the procedure. SITE 1: LEFT BREAST 10 O'CLOCK: MASS: Lesion quadrant: UPPER INNER Using sterile technique and 1% Lidocaine as local anesthetic, under direct ultrasound visualization, a 12 gauge spring-loaded device was used to perform biopsy of the mass in the left breast at the 10 o'clock position using a medial to lateral approach. At the conclusion of the procedure a ribbon shaped tissue marker clip was deployed into the biopsy cavity. Follow up 2 view mammogram was performed and dictated separately. SITE 2: LEFT BREAST 5:30 POSITION: Lesion quadrant: LOWER OUTER Using sterile technique and 1% Lidocaine as local anesthetic, under direct ultrasound visualization, a 12 gauge spring-loaded device was used to perform biopsy of the mass in the left breast at the 5:30 position using a lateral to medial approach. At the conclusion of the procedure a coil shaped tissue marker clip was deployed into the biopsy cavity. Follow up 2 view mammogram was performed and dictated separately. IMPRESSION: 1. Ultrasound-guided biopsy of the mass in the left breast at the 10 o'clock position, at site of ribbon shaped biopsy marking clip. 2. Ultrasound-guided biopsy of the mass in the left breast at 5:30 position, at site of coil shaped biopsy marking clip. Electronically Signed: By: Everlean Alstrom M.D. On: 03/16/2019 13:42      IMPRESSION: Stage T1c Left Breast UIQ, Invasive Ductal Carcinoma,  ER+ / PR+ / Her2+, Grade 3 (triple positive)  Patient will be a good candidate for  breast conservation with radiotherapy to left breast. We discussed the general course of radiation, potential side effects, and toxicities with radiation and the patient is interested in this approach.    PLAN:  1. Genetics testing 2. Bilateral breast MRI 3. Left lumpectomy with sentinel lymph node biopsy 4. Adjuvant chemotherapy  5. Adjuvant radiation therapy 6. Aromatase Inhibitor   ------------------------------------------------  Blair Promise, PhD, MD  This document serves as a record of services personally performed by Gery Pray, MD. It was created on his behalf by Wilburn Mylar, a trained medical scribe. The creation of this record is based on the scribe's personal observations and the provider's statements to them. This document has been checked and approved by the attending provider.

## 2019-03-22 NOTE — Patient Instructions (Signed)

## 2019-03-22 NOTE — Progress Notes (Signed)
REFERRING PROVIDER: Truitt Merle, MD [address] [place],  [place] [address]  PRIMARY PROVIDER:  Wenda Low, MD  PRIMARY REASON FOR VISIT:  1. Family history of breast cancer   2. Family history of lung cancer     I connected with Chelsea Hobbs on 03/22/2019 at 3:30 PM EDT by Webex and verified that I am speaking with the correct person using two identifiers.    Patient location: [facility] Provider location: office  HISTORY OF PRESENT ILLNESS:   Chelsea Hobbs, a 48 y.o. female, was seen for a [place] cancer genetics consultation  due to a personal and family history of cancer.  Chelsea Hobbs presents to clinic today to discuss the possibility of a hereditary predisposition to cancer, genetic testing, and to further clarify her future cancer risks, as well as potential cancer risks for family members.   In 2020, at the age of 36, Chelsea Hobbs was diagnosed with IDC of the left breast, ER/PR/Her2+. The treatment plan includes surgery, adjuvant chemotherapy, possible adjuvant radiation.   CANCER HISTORY:  Oncology History Overview Note  Cancer Staging Malignant neoplasm of upper-inner quadrant of left breast in female, estrogen receptor positive ([place]) Staging form: Breast, AJCC 8th Edition - Clinical stage from 03/22/2019: Stage IA (cT1c, cN0, cM0, G3, ER+, PR+, HER2+) - Signed by Truitt Merle, MD on 03/22/2019    Malignant neoplasm of upper-inner quadrant of left breast in female, estrogen receptor positive ([place])  03/10/2019 Mammogram   Diagnostic Mammogram 03/10/19  IMPRESSION: 1. Suspicious mass in the 10 o'clock position of the left breast, 3cm from the nipple measuring 12x7x37m. Biopsy is indicated. 2. Indeterminate mass in the 5:30 o'clock position of the left breast, 5cm from the nipple, measuring 9x6x771m Although this may reflect a fibroadenoma, tissue sampling is recommended.   03/16/2019 Initial Biopsy   Diagnosis 03/16/19  1. Breast, left, needle core biopsy, 10 o'clock - INVASIVE DUCTAL  CARCINOMA, GRADE 3. SEE NOTE 2. Breast, left, needle core biopsy, 5:30 o'clock - FIBROADENOMA   03/16/2019 Receptors her2   Results: IMMUNOHISTOCHEMICAL AND MORPHOMETRIC ANALYSIS PERFORMED MANUALLY The tumor cells are POSITIVE for Her2 (3+). Estrogen Receptor: 100%, POSITIVE, STRONG STAINING INTENSITY Progesterone Receptor: 100%, POSITIVE, STRONG STAINING INTENSITY Proliferation Marker Ki67: 60%   03/21/2019 Initial Diagnosis   Malignant neoplasm of upper-inner quadrant of left breast in [place], [place] positive [place]  03/22/2019 Cancer Staging   Staging form: Breast, AJCC 8th Edition - Clinical stage from 03/22/2019: Stage IA (cT1c, cN0, cM0, G3, ER+, PR+, HER2+) - Signed by FeTruitt MerleMD on 03/22/2019      RISK FACTORS:  Menarche was at age 48 First live birth at age 48 OCP use for approximately 10 years.  Ovaries intact: yes.  Hysterectomy: no.  Mammogram within the last year: yes.   Past Medical History:  Diagnosis Date  . Asthma   . Family history of breast cancer   . Family history of lung cancer     Past Surgical History:  Procedure Laterality Date  . BREAST BIOPSY Right   . NASAL SINUS SURGERY      Social History   Socioeconomic History  . Marital status: Married    Spouse name: Not on file  . Number of children: 2  . Years of education: Not on file  . Highest education level: Not on file  Occupational History  . Occupation: dental hygenist   Social Needs  . Financial resource strain: Not on file  . Food insecurity  Worry: Not on file    Inability: Not on file  . Transportation needs    Medical: Not on file    Non-medical: Not on file  Tobacco Use  . Smoking status: Never Smoker  . Smokeless tobacco: Never Used  Substance and Sexual Activity  . Alcohol use: Yes    Alcohol/week: 8.0 - 10.0 standard drinks    Types: 8 - 10 Cans of beer per week    Comment: occasionally   . Drug use: No  . Sexual activity: Not on file  Lifestyle  .  Physical activity    Days per week: Not on file    Minutes per session: Not on file  . Stress: Not on file  Relationships  . Social Herbalist on phone: Not on file    Gets together: Not on file    Attends religious service: Not on file    Active member of club or organization: Not on file    Attends meetings of clubs or organizations: Not on file    Relationship status: Not on file  Other Topics Concern  . Not on file  Social History Narrative  . Not on file     FAMILY HISTORY:  We obtained a detailed, 4-generation family history.  Significant diagnoses are listed below: Family History  Problem Relation Age of Onset  . Breast cancer Mother 45  . Cancer Maternal Grandmother 75       lung cancer  . Cancer Maternal Grandfather 39       lung cancer  . Breast cancer Paternal Grandmother 7   Chelsea Hobbs has a son, 65, and a daughter, 47, no cancers. She has one brother, 36, no history of cancer.   Chelsea Hobbs mother was recently diagnosed with breast cancer at 78. Patient's mother was an only child. Patient's maternal grandmother died from lung cancer in her 69s, grandfather died from lung cancer in his 22s.   Chelsea Hobbs's father is living at 5.She has limited information about this side of the family. She knows of at least 2 paternal uncles, 1 paternal aunt. No cancers she is aware of. Her paternal grandmother had breast cancer, she believes in her 77s.   Chelsea Hobbs is unaware of previous family history of genetic testing for hereditary cancer risks. There is no reported Ashkenazi Jewish ancestry. There is no known consanguinity.  GENETIC COUNSELING ASSESSMENT: Chelsea Hobbs is a 48 y.o. female with a personal and family history which is somewhat suggestive of a hereditary cancer syndrome and predisposition to cancer. We, therefore, discussed and recommended the following at today's visit.   DISCUSSION: We discussed that 5 - 10% of breast cancer is hereditary, with most cases  associated with BRCA1/BRCA2 mutations.  There are other genes that can be associated with hereditary breast cancer syndromes.  These include PALB2, CHEK2, ATM.   We discussed that testing is beneficial for several reasons including surgical decision-making for breast cancer, knowing how to follow individuals after completing their treatment, and understand if other family members could be at risk for cancer and allow them to undergo genetic testing.   We reviewed the characteristics, features and inheritance patterns of hereditary cancer syndromes. We also discussed genetic testing, including the appropriate family members to test, the process of testing, insurance coverage and turn-around-time for results. We discussed the implications of a negative, positive and/or variant of uncertain significant result. In order to get genetic test results in a timely manner so that  Chelsea Hobbs can use these genetic test results for surgical decisions, we recommended Chelsea Hobbs pursue genetic testing for the Breast Cancer STAT Panel. Once complete, we recommend Chelsea Hobbs pursue reflex genetic testing to the Common Hereditary Cancers gene panel.   The STAT Breast cancer panel offered by [facility] includes sequencing and rearrangement analysis for the following 9 genes:  ATM, BRCA1, BRCA2, CDH1, CHEK2, PALB2, PTEN, STK11 and TP53.    The Common Hereditary Cancers Panel offered by [facility] includes sequencing and/or deletion duplication testing of the following 47 genes: APC, ATM, AXIN2, BARD1, BMPR1A, BRCA1, BRCA2, BRIP1, CDH1, CDKN2A (p14ARF), CDKN2A (p16INK4a), CKD4, CHEK2, CTNNA1, DICER1, EPCAM (Deletion/duplication testing only), GREM1 (promoter region deletion/duplication testing only), KIT, MEN1, MLH1, MSH2, MSH3, MSH6, MUTYH, NBN, NF1, NHTL1, PALB2, PDGFRA, PMS2, POLD1, POLE, PTEN, RAD50, RAD51C, RAD51D,  SDHB, SDHC, SDHD, SMAD4, SMARCA4. STK11, TP53, TSC1, TSC2, and VHL.  The following genes were evaluated for sequence  changes only: SDHA and HOXB13 c.251G>A variant only.  Based on Ms. Brigham's personal and family history of cancer, she meets medical criteria for genetic testing. Despite that she meets criteria, she may still have an out of pocket cost.   PLAN: After considering the risks, benefits, and limitations, Chelsea Hobbs provided informed consent to pursue genetic testing and the blood sample was sent to [facility] for analysis of the Breast Cancer STAT Panel + Common Hereditary cancers Panel. Results should be available within approximately 5-12 days' time, at which point they will be disclosed by telephone to Chelsea Hobbs, as will any additional recommendations warranted by these results. Chelsea Hobbs will receive a summary of her genetic counseling visit and a copy of her results once available. This information will also be available in Epic.   Chelsea Hobbs questions were answered to her satisfaction today. Our contact information was provided should additional questions or concerns arise. Thank you for the referral and allowing [place] to share in the care of your patient.   Faith Rogue, MS, [facility] Genetic Counselor [email or web address] Phone: [phone]  The patient was seen for a total of 15 minutes in virtual genetic counseling.  Drs. Magrinat, Lindi Adie and/or Burr Medico were available for discussion regarding this case.   _______________________________________________________________________ For Office Staff:  Number of people involved in session: 1 Was an Intern/ student involved with case: no

## 2019-03-22 NOTE — Progress Notes (Signed)
[facility]   [phone] Fax:([phone] [place] Note   Patient Care Team: Wenda Low, MD as PCP - General (Internal Medicine) Rockwell Germany, RN as Oncology Nurse Navigator Mauro Kaufmann, RN as Oncology Nurse Navigator Erroll Luna, MD as Consulting Physician (General Surgery) Truitt Merle, MD as Consulting Physician (Hematology) Gery Pray, MD as Consulting Physician (Radiation Oncology)  Date of Service:  03/22/2019   CHIEF COMPLAINTS/PURPOSE OF CONSULTATION:  Newly Diagnosed Malignant neoplasm of upper-inner quadrant of left breast    Oncology History Overview Note  Cancer Staging Malignant neoplasm of upper-inner quadrant of left breast in female, estrogen receptor positive ([place]) Staging form: Breast, AJCC 8th Edition - Clinical stage from 03/22/2019: Stage IA (cT1c, cN0, cM0, G3, ER+, PR+, HER2+) - Signed by Truitt Merle, MD on 03/22/2019    Malignant neoplasm of upper-inner quadrant of left breast in female, estrogen receptor positive ([place])  03/10/2019 Mammogram   Diagnostic Mammogram 03/10/19  IMPRESSION: 1. Suspicious mass in the 10 o'clock position of the left breast, 3cm from the nipple measuring 12x7x60m. Biopsy is indicated. 2. Indeterminate mass in the 5:30 o'clock position of the left breast, 5cm from the nipple, measuring 9x6x761m Although this may reflect a fibroadenoma, tissue sampling is recommended.   03/16/2019 Initial Biopsy   Diagnosis 03/16/19  1. 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 SEE NOTE 2. Breast, left, needle core biopsy, 5:30 o'clock - FIBROADENOMA   03/16/2019 Receptors her2   Results: IMMUNOHISTOCHEMICAL AND MORPHOMETRIC ANALYSIS PERFORMED MANUALLY The tumor cells are POSITIVE for Her2 (3+). Estrogen Receptor: 100%, POSITIVE, STRONG STAINING INTENSITY Progesterone Receptor: 100%, POSITIVE, STRONG STAINING INTENSITY Proliferation Marker Ki67: 60%     03/21/2019 Initial Diagnosis   Malignant neoplasm of upper-inner quadrant of left breast in female, estrogen receptor positive ([facility]  03/22/2019 Cancer Staging   Staging form: Breast, AJCC 8th Edition - Clinical stage from 03/22/2019: Stage IA (cT1c, cN0, cM0, G3, ER+, PR+, HER2+) - Signed by FeTruitt MerleMD on 03/22/2019      HISTORY OF PRESENTING ILLNESS:  KiTamakia Portoeal [age].o. female is a here because of newly diagnosed left breast cancer. The patient presents to the Breast clinic today alone.  Her left breast mass was found by mammogram. She did not feel the mass herself. She has yearly mammogram usually. She had abnormal mammogram 10 years ago when a fibroadenoma was biopsy and found. She denies any pain or breast, weight change. She does not check her breasts.Today she notes she is stressed but still sleeping adequately.   Socially she is married with 2 college students. 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 She has a IUD Mirena in place. She has had it since her last child. She has vaginal spotting and bloating but no true period. She has had hot flashes intermittently for the past 2 years. She was on estrogen patch for 4 months.   They have a PMHx of bowel issues, latest IBS. She had colitis in the past. She has had 3 colonoscopies total. She had a few kidney stones with no recent flare. She has allegra for chronic allergies, manageable. She has had sinus surgery and wisdom teeth removal. She does have Anxiety which Dr. HuRueben Bashut her on Xanax and Effexor previously. She was on Effexor for less than 1 year.  She has been stressed around that time.  Her mother had breast cancer at 20yo. She just completed her breast surgery. Her PGM had breast cancer. Her MGM passed from lung cancer in her 9s due to smoking. Her MGF died from lung cancer in his 65s from smoking.    GYN HISTORY   Menarchal: 15 LMP: Mild spotting occasionally Contraceptive: BCP (17-27yo), Shot while breast feeding, IUD currently in place.  HRT: on estrogen patch for 4 months  G2P2: first at age 21    REVIEW OF SYSTEMS:    Constitutional: Denies fevers, chills or abnormal night sweats Eyes: Denies blurriness of vision, double vision or watery eyes Ears, nose, mouth, throat, and face: Denies mucositis or sore throat Respiratory: Denies cough, dyspnea or wheezes Cardiovascular: Denies palpitation, chest discomfort or lower extremity swelling Gastrointestinal:  Denies nausea, heartburn or change in bowel habits Skin: Denies abnormal skin rashes Lymphatics: Denies new lymphadenopathy or easy bruising Neurological:Denies numbness, tingling or new weaknesses Behavioral/Psych: Mood is stable, no new changes (+) Anxious, stressed, emotional  All other systems were reviewed with the patient and are negative.   MEDICAL HISTORY:  Past Medical History:  Diagnosis Date   Asthma    Family history of breast cancer    Family history of lung cancer     SURGICAL HISTORY: Past Surgical History:  Procedure Laterality Date   BREAST BIOPSY Right    NASAL SINUS SURGERY      SOCIAL HISTORY: Social History   Socioeconomic History   Marital status: Married    Spouse name: Not on file   Number of children: 2   Years of education: Not on file   Highest education level: Not on file  Occupational History   Occupation: Facilities manager hygenist   Social Needs   Financial resource strain: Not on file   Food insecurity    Worry: Not on file    Inability: Not on file   Transportation needs    Medical: Not on file    Non-medical: Not on file  Tobacco Use   Smoking status: Never Smoker   Smokeless tobacco: Never Used  Substance and Sexual Activity   Alcohol use: Yes    Alcohol/week: 8.0 - 10.0 standard drinks    Types: 8 - 10 Cans of beer per week    Comment: occasionally    Drug use: No    Sexual activity: Not on file  Lifestyle   Physical activity    Days per week: Not on file    Minutes per session: Not on file   Stress: Not on file  Relationships   Social connections    Talks on phone: Not on file    Gets together: Not on file    Attends religious service: Not on file    Active member of club or organization: Not on file    Attends meetings of clubs or organizations: Not on file    Relationship status: Not on file   Intimate partner violence    Fear of current or ex partner: Not on file    Emotionally abused: Not on file    Physically abused: Not on file    Forced sexual activity: Not on file  Other Topics Concern   Not on file  Social History Narrative   Not on file    FAMILY HISTORY: Family History  Problem Relation Age of Onset   Breast cancer Mother 32   Cancer Maternal Grandmother 60       lung  cancer   Cancer Maternal Grandfather 54       lung cancer   Breast cancer Paternal Grandmother 50    ALLERGIES:  is allergic to doxycycline and latex.  MEDICATIONS:  Current Outpatient Medications  Medication Sig Dispense Refill   albuterol (PROVENTIL HFA;VENTOLIN HFA) 108 (90 BASE) MCG/ACT inhaler Inhale 2 puffs into the lungs every 6 (six) hours as needed. For shortness of breath     B Complex Vitamins (VITAMIN-B COMPLEX) TABS Take 1 tablet by mouth daily.     cetirizine (ZYRTEC) 10 MG tablet Take 10 mg by mouth daily as needed for allergies.     fexofenadine-pseudoephedrine (ALLEGRA-D) 60-120 MG per tablet Take 1 tablet by mouth daily as needed (congestion).      HYDROcodone-acetaminophen (NORCO/VICODIN) 5-325 MG per tablet Take 1 tablet by mouth every 6 (six) hours as needed for moderate pain.     ibuprofen (ADVIL,MOTRIN) 200 MG tablet Take 400 mg by mouth every 6 (six) hours as needed. For headaches     ketorolac (TORADOL) 10 MG tablet Take 1 tablet (10 mg total) by mouth every 6 (six) hours. 10 tablet 0   Multiple Vitamin  (MULTIVITAMIN WITH MINERALS) TABS Take 1 tablet by mouth daily.     Omega-3 Fatty Acids (FISH OIL PO) Take 1 tablet by mouth daily.     ondansetron (ZOFRAN ODT) 4 MG disintegrating tablet Take 1 tablet (4 mg total) by mouth every 8 (eight) hours as needed for nausea or vomiting. 14 tablet 0   oxyCODONE-acetaminophen (PERCOCET/ROXICET) 5-325 MG per tablet Take 1 or 2 po Q 6hrs for pain 30 tablet 0   tamsulosin (FLOMAX) 0.4 MG CAPS capsule Once daily until you pass the kidney stone 10 capsule 0   No current facility-administered medications for this visit.     PHYSICAL EXAMINATION: ECOG PERFORMANCE STATUS: 0 - Asymptomatic  Vitals:   03/22/19 1300  BP: 139/84  Pulse: 74  Resp: 18  Temp: 99.1 F (37.3 C)  SpO2: 100%   Filed Weights   03/22/19 1300  Weight: 129 lb 14.4 oz (58.9 kg)    GENERAL:alert, no distress and comfortable SKIN: skin color, texture, turgor are normal, no rashes or significant lesions EYES: normal, Conjunctiva are pink and non-injected, sclera clear  NECK: supple, thyroid normal size, non-tender, without nodularity LYMPH:  no palpable lymphadenopathy in the cervical, axillary  LUNGS: clear to auscultation and percussion with normal breathing effort HEART: regular rate & rhythm and no murmurs and no lower extremity edema ABDOMEN:abdomen soft, non-tender and normal bowel sounds Musculoskeletal:no cyanosis of digits and no clubbing  NEURO: alert & oriented x 3 with fluent speech, no focal motor/sensory deficits BREAST: (+) 1cm left breast mass, smooth and movable. (+) Mild skin ecchymosis at biopsy site. No palpable adenopathy bilaterally.   LABORATORY DATA:  I have reviewed the data as listed CBC Latest Ref Rng & Units 03/22/2019 07/29/2013 05/09/2012  WBC 4.0 - 10.5 K/uL 7.4 12.5(H) 13.2(H)  Hemoglobin 12.0 - 15.0 g/dL 13.4 12.9 12.5  Hematocrit 36.0 - 46.0 % 39.3 36.6 35.5(L)  Platelets 150 - 400 K/uL 343 396 322    CMP Latest Ref Rng & Units 03/22/2019  07/29/2013 05/09/2012  Glucose 70 - 99 mg/dL 101(H) 116(H) 168(H)  BUN 6 - 20 mg/dL _0 Creatinine 0.44 - 1.00 mg/dL 0.78 0.64 0.69  Sodium 135 - 145 mmol/L 142 133(L) 138  Potassium 3.5 - 5.1 mmol/L 3.3(L) 3.9 3.5  Chloride 98 - 111 mmol/L 105  95(L) 102  CO2 22 - 32 mmol/L _0 Calcium 8.9 - 10.3 mg/dL 9.6 8.9 9.1  Total Protein 6.5 - 8.1 g/dL 7.9 6.8 -  Total Bilirubin 0.3 - 1.2 mg/dL 0.6 0.6 -  Alkaline Phos 38 - 126 U/L 52 52 -  AST 15 - 41 U/L 18 18 -  ALT 0 - 44 U/L 15 25 -     RADIOGRAPHIC STUDIES: I have personally reviewed the radiological images as listed and agreed with the findings in the report. US Breast Ltd Uni Left Inc Axilla  Result Date: 03/10/2019 CLINICAL DATA:  Screening recall for a possible mass and possible asymmetry in the left breast. EXAM: DIGITAL DIAGNOSTIC LEFT MAMMOGRAM WITH CAD AND TOMO ULTRASOUND LEFT BREAST COMPARISON:  Previous exam(s). ACR Breast Density Category c: The breast tissue is heterogeneously dense, which may obscure small masses. FINDINGS: On the diagnostic spot-compression images, the asymmetry, which projects in the posterior, medial aspect of the left breast, persists. It has irregular margins suspicious for an irregular mass. It measures 9 mm in greatest dimension. The other lesion noted on the screening study is not well-defined on the CC diagnostic spot-compression imaging, but is visualized on the spot-compression MLO image has a circumscribed oval mass, lower aspect of the left breast, measuring 1 cm in size. No other abnormalities. Mammographic images were processed with CAD. On physical exam, no discrete mass is palpated in the medial or lower aspect of the left breast. Targeted ultrasound is performed, showing a hypoechoic, irregular mass with partly ill-defined margins in the posterior aspect of the left breast at 10 o'clock, 3 cm the nipple, measuring 12 x 7 x 10 mm. This is consistent in size, shape and location to the  irregular mass seen mammographically. In the 5:30 o'clock position of the left breast, 5 cm the nipple, superficial depth, there is an oval, circumscribed hypoechoic mass measuring 9 x 6 x 7 mm, consistent with the inferior mass seen mammographically. Sonographic evaluation of the left axilla shows no enlarged or abnormal lymph nodes. IMPRESSION: 1. Suspicious mass in the 10 o'clock position of the left breast. Biopsy is indicated. 2. Indeterminate mass in the 5:30 o'clock position of the left breast. Although this may reflect a fibroadenoma, tissue sampling is recommended. RECOMMENDATION: 1. Ultrasound-guided core needle biopsy of the mass in the posterior left breast at 10 o'clock, 3 cm the nipple. 2. Ultrasound-guided core needle biopsy of the more superficial and more benign-appearing mass in the 5:30 o'clock position of the left breast. I have discussed the findings and recommendations with the patient. Results were also provided in writing at the conclusion of the visit. If applicable, a reminder letter will be sent to the patient regarding the next appointment. BI-RADS CATEGORY  4: Suspicious. Electronically Signed   By: Lajean Manes M.D.   On: 03/10/2019 15:52   Mm Diag Breast Tomo Uni Left  Result Date: 03/10/2019 CLINICAL DATA:  Screening recall for a possible mass and possible asymmetry in the left breast. EXAM: DIGITAL DIAGNOSTIC LEFT MAMMOGRAM WITH CAD AND TOMO ULTRASOUND LEFT BREAST COMPARISON:  Previous exam(s). ACR Breast Density Category c: The breast tissue is heterogeneously dense, which may obscure small masses. FINDINGS: On the diagnostic spot-compression images, the asymmetry, which projects in the posterior, medial aspect of the left breast, persists. It has irregular margins suspicious for an irregular mass. It measures 9 mm in greatest dimension. The other lesion noted on the screening study is not well-defined on the CC  diagnostic spot-compression imaging, but is visualized on the  spot-compression MLO image has a circumscribed oval mass, lower aspect of the left breast, measuring 1 cm in size. No other abnormalities. Mammographic images were processed with CAD. On physical exam, no discrete mass is palpated in the medial or lower aspect of the left breast. Targeted ultrasound is performed, showing a hypoechoic, irregular mass with partly ill-defined margins in the posterior aspect of the left breast at 10 o'clock, 3 cm the nipple, measuring 12 x 7 x 10 mm. This is consistent in size, shape and location to the irregular mass seen mammographically. In the 5:30 o'clock position of the left breast, 5 cm the nipple, superficial depth, there is an oval, circumscribed hypoechoic mass measuring 9 x 6 x 7 mm, consistent with the inferior mass seen mammographically. Sonographic evaluation of the left axilla shows no enlarged or abnormal lymph nodes. IMPRESSION: 1. Suspicious mass in the 10 o'clock position of the left breast. Biopsy is indicated. 2. Indeterminate mass in the 5:30 o'clock position of the left breast. Although this may reflect a fibroadenoma, tissue sampling is recommended. RECOMMENDATION: 1. Ultrasound-guided core needle biopsy of the mass in the posterior left breast at 10 o'clock, 3 cm the nipple. 2. Ultrasound-guided core needle biopsy of the more superficial and more benign-appearing mass in the 5:30 o'clock position of the left breast. I have discussed the findings and recommendations with the patient. Results were also provided in writing at the conclusion of the visit. If applicable, a reminder letter will be sent to the patient regarding the next appointment. BI-RADS CATEGORY  4: Suspicious. Electronically Signed   By: Lajean Manes M.D.   On: 03/10/2019 15:52   Mm 3d Screen Breast Bilateral  Result Date: 03/07/2019 CLINICAL DATA:  Screening. EXAM: DIGITAL SCREENING BILATERAL MAMMOGRAM WITH TOMO AND CAD COMPARISON:  Previous exam(s). ACR Breast Density Category c: The breast  tissue is heterogeneously dense, which may obscure small masses. FINDINGS: In the left breast, a possible mass warrants further evaluation. There is also a possible asymmetry in the slightly medial left breast seen on the CC view. In the right breast, no findings suspicious for malignancy. Images were processed with CAD. IMPRESSION: Further evaluation is suggested for possible mass and possible asymmetry in the left breast. RECOMMENDATION: Diagnostic mammogram and possibly ultrasound of the left breast. (Code:FI-L-27M) The patient will be contacted regarding the findings, and additional imaging will be scheduled. BI-RADS CATEGORY  0: Incomplete. Need additional imaging evaluation and/or prior mammograms for comparison. Electronically Signed   By: Audie Pinto M.D.   On: 03/07/2019 17:48   Mm Clip Placement Left  Result Date: 03/16/2019 CLINICAL DATA:  Post ultrasound-guided biopsy of a mass in the left breast at the 10 o'clock position and ultrasound-guided biopsy of a mass in the left breast at the 5:30 position. EXAM: DIAGNOSTIC LEFT MAMMOGRAM POST ULTRASOUND BIOPSY COMPARISON:  Previous exam(s). FINDINGS: Mammographic images were obtained following ultrasound guided biopsy of a mass in the left breast at the 10 o'clock position and ultrasound-guided biopsy of a mass in the left breast at the 5:30 position. A ribbon shaped biopsy marking clip is present at the site of the biopsied mass in the left breast at the 10 o'clock position. A coil shaped biopsy marking clip is present at the site of the biopsied mass in the left breast at the 5:30 position. IMPRESSION: 1. Ribbon shaped biopsy marking clip at site of biopsied mass in the left breast at the 10 o'clock  position. 2. Coil shaped biopsy marking clip at site of biopsied mass in the left breast at the 5:30 position. Final Assessment: Post Procedure Mammograms for Marker Placement Electronically Signed   By: Everlean Alstrom M.D.   On: 03/16/2019 13:51    [place] Lt Breast Bx W Loc Dev 1st Lesion Img Bx Spec US Guide  Addendum Date: 03/21/2019   ADDENDUM REPORT: 03/21/2019 07:03 ADDENDUM: Pathology revealed GRADE III INVASIVE DUCTAL CARCINOMA of the Left breast, 10 o'clock. This was found to be concordant by Dr. Everlean Alstrom. Pathology revealed FIBROADENOMA of the Left breast, 5:30 o'clock. This was found to be concordant by Dr. Everlean Alstrom. Pathology results were discussed with the patient by telephone. The patient reported doing well after the biopsies with tenderness at the sites. Post biopsy instructions and care were reviewed and questions were answered. The patient was encouraged to call [facility] for any additional concerns. The patient was referred to [facility] at [facility] on March 22, 2019. Pathology results reported by Terie Purser, RN on 03/21/2019. Electronically Signed   By: Everlean Alstrom M.D.   On: 03/21/2019 07:03   Result Date: 03/21/2019 CLINICAL DATA:  48 year old female with suspicious mass in the left breast at the 10 o'clock position an indeterminate mass in the left breast at the 5:30 position. EXAM: ULTRASOUND GUIDED LEFT BREAST CORE NEEDLE BIOPSY COMPARISON:  Previous exam(s). FINDINGS: I met with the patient and we discussed the procedure of ultrasound-guided biopsy, including benefits and alternatives. We discussed the high likelihood of a successful procedure. We discussed the risks of the procedure, including infection, bleeding, tissue injury, clip migration, and inadequate sampling. Informed written consent was given. The usual time-out protocol was performed immediately prior to the procedure. SITE 1: LEFT BREAST 10 O'CLOCK: MASS: Lesion quadrant: UPPER INNER Using sterile technique and 1% Lidocaine as local anesthetic, under direct ultrasound visualization, a 12 gauge spring-loaded device was used to perform biopsy of the mass in  the left breast at the 10 o'clock position using a medial to lateral approach. At the conclusion of the procedure a ribbon shaped tissue marker clip was deployed into the biopsy cavity. Follow up 2 view mammogram was performed and dictated separately. SITE 2: LEFT BREAST 5:30 POSITION: Lesion quadrant: LOWER OUTER Using sterile technique and 1% Lidocaine as local anesthetic, under direct ultrasound visualization, a 12 gauge spring-loaded device was used to perform biopsy of the mass in the left breast at the 5:30 position using a lateral to medial approach. At the conclusion of the procedure a coil shaped tissue marker clip was deployed into the biopsy cavity. Follow up 2 view mammogram was performed and dictated separately. IMPRESSION: 1. Ultrasound-guided biopsy of the mass in the left breast at the 10 o'clock position, at site of ribbon shaped biopsy marking clip. 2. Ultrasound-guided biopsy of the mass in the left breast at 5:30 position, at site of coil shaped biopsy marking clip. Electronically Signed: By: Everlean Alstrom M.D. On: 03/16/2019 13:42   [place] Lt Breast Bx W Loc Dev Ea Add Lesion Img Bx Spec US Guide  Addendum Date: 03/21/2019   ADDENDUM REPORT: 03/21/2019 07:03 ADDENDUM: Pathology revealed GRADE III INVASIVE DUCTAL CARCINOMA of the Left breast, 10 o'clock. This was found to be concordant by Dr. Everlean Alstrom. Pathology revealed FIBROADENOMA of the Left breast, 5:30 o'clock. This was found to be concordant by Dr. Everlean Alstrom. Pathology results were discussed with the  patient by telephone. The patient reported doing well after the biopsies with tenderness at the sites. Post biopsy instructions and care were reviewed and questions were answered. The patient was encouraged to call [facility] for any additional concerns. The patient was referred to [facility] at [facility] on March 22, 2019. Pathology  results reported by Terie Purser, RN on 03/21/2019. Electronically Signed   By: Everlean Alstrom M.D.   On: 03/21/2019 07:03   Result Date: 03/21/2019 CLINICAL DATA:  48 year old female with suspicious mass in the left breast at the 10 o'clock position an indeterminate mass in the left breast at the 5:30 position. EXAM: ULTRASOUND GUIDED LEFT BREAST CORE NEEDLE BIOPSY COMPARISON:  Previous exam(s). FINDINGS: I met with the patient and we discussed the procedure of ultrasound-guided biopsy, including benefits and alternatives. We discussed the high likelihood of a successful procedure. We discussed the risks of the procedure, including infection, bleeding, tissue injury, clip migration, and inadequate sampling. Informed written consent was given. The usual time-out protocol was performed immediately prior to the procedure. SITE 1: LEFT BREAST 10 O'CLOCK: MASS: Lesion quadrant: UPPER INNER Using sterile technique and 1% Lidocaine as local anesthetic, under direct ultrasound visualization, a 12 gauge spring-loaded device was used to perform biopsy of the mass in the left breast at the 10 o'clock position using a medial to lateral approach. At the conclusion of the procedure a ribbon shaped tissue marker clip was deployed into the biopsy cavity. Follow up 2 view mammogram was performed and dictated separately. SITE 2: LEFT BREAST 5:30 POSITION: Lesion quadrant: LOWER OUTER Using sterile technique and 1% Lidocaine as local anesthetic, under direct ultrasound visualization, a 12 gauge spring-loaded device was used to perform biopsy of the mass in the left breast at the 5:30 position using a lateral to medial approach. At the conclusion of the procedure a coil shaped tissue marker clip was deployed into the biopsy cavity. Follow up 2 view mammogram was performed and dictated separately. IMPRESSION: 1. Ultrasound-guided biopsy of the mass in the left breast at the 10 o'clock position, at site of ribbon shaped biopsy marking  clip. 2. Ultrasound-guided biopsy of the mass in the left breast at 5:30 position, at site of coil shaped biopsy marking clip. Electronically Signed: By: Everlean Alstrom M.D. On: 03/16/2019 13:42    ASSESSMENT & PLAN:  Mickayla Trouten Holsworth is a 48 y.o. Caucasian female with a history of asthma, kidney stones, IBS and colitis.   1 Malignant neoplasm of upper-inner quadrant of left breast, Stage IA, c(T1cN0M0), ER+/PR+, HER2+, Grade III -We discussed her image findings and the biopsy results in great details. 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  -Given the Early stage disease, she is likely need a candidate for lumpectomy and Sentinel LN biopsy. She is agreeable with that. She was seen by Dr. Malachi Paradise today and likely will proceed with surgery soon.  -due to her young age and family history of breast cancer, she will undergo genetic test, blood was drawn today  -The risk of recurrence depends on the stage and biology of the tumor. She has early stage disease, but HER2 positive. I discussed this is a more aggressive type of tumor.  -Given her HER2 positive, Grade III disease her risk of recurrence is higher. I recommend adjuvant chemotherapy to reduce her risk of recurrence with weekly Taxol and anti-Her2 Herceptin for 12 weeks. This  will be followed by Herceptin maintenance q3weeks to complete 1 year of treatment. -If her tumor>2cm or positive node(s) on surgical path, I recommend more intensive chemo with TCHP (docetaxel, carboplatin, Herceptin and Perjeta) every 3 weeks for 6 cycles, followed by Herceptin and Perjeta maintenance therapy to complete 1 year therapy.  --Chemotherapy consent: Side effects including but does not not limited to, fatigue, nausea, vomiting, diarrhea, hair loss, neuropathy, fluid retention, renal and kidney dysfunction, neutropenic fever, needed for blood transfusion, bleeding, reversible cardiomyopathy, skin rash, etc, were  discussed with patient in great detail. She agrees to proceed.  -The goal of therapy is curative. -Given her very early stage disease, no palpable breast mass, I do not think she needs neoadjuvant chemo. -Given HER2 positive disease and high breast density, breast MRI is recommended for further evaluation. She is agreeable.  -Due to early stage disease, no need staging scan. -Given she plans to have 1 year IV treatment, I discussed the option of having PAC placed. She is interested.  -She was also seen by radiation oncologist Dr. Sondra Come today. Adjuvant radiation is recommended to reduce the risk for local recurrence.  -Given her tumor is ER/PR positive, I recommend she have her IUD, Mirena removed and recommend she not take anymore estrogen containing medications or substances. I also recommend adjuvant antiestrogen therapy, with medication based on her menopausal status, to reduce her risk of distant recurrence. This will start after she completes radiation.  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 Labs reviewed today, CBC and CMP WNL except potassium at 3.3. I encouraged her to increase potasium in her diet.  -F/u after surgery.    2. Genetic Testing  -Her Mother was recently diagnosed with breast cancer and her PGM had breast cancer. She is eligible for genetic testing. She is interested. Will proceed with testing.    3. Anxiety  -Previously on Xanax and Effexor -Managed by her PCP -She does feels stressed about finances and her overall treatment plan. -She would like to be able to work through this treatment.  -I discussed our financial office, social work office for counseling and advocates who are available to her. She is interested in speaking with SW soon.    PLAN:  -Send Genetic Referral for urgent genetic testing  -Breast MRI in 1-2 weeks  -Proceed with surgery and PAC placement.  -F/u 2 weeks after surgery to  finalize adjuvant chemo.  -echo before Herceptin therapy   Orders Placed This Encounter  Procedures   MR BREAST BILATERAL W [place] CAD    Standing Status:   Future    Standing Expiration Date:   05/21/2020    Order Specific Question:   If indicated for the ordered procedure, I authorize the administration of contrast media per Radiology protocol    Answer:   Yes    Order Specific Question:   What is the patient's sedation requirement?    Answer:   No Sedation    Order Specific Question:   Does the patient have a pacemaker or implanted devices?    Answer:   No    Order Specific Question:   Radiology Contrast Protocol - do NOT remove file path    Answer:   \[email or web address]    Order Specific Question:   Preferred imaging location?    Answer:   [facility] (table limit-350 lbs)   ECHOCARDIOGRAM COMPLETE    Standing Status:   Future  Standing Expiration Date:   06/20/2020    Order Specific Question:   Where should this test be performed    Answer:   [place]    Order Specific Question:   Perflutren DEFINITY (image enhancing agent) should be administered unless hypersensitivity or allergy exist    Answer:   Administer Perflutren    Order Specific Question:   Reason for exam-Echo    Answer:   Chemotherapy evaluation  v87.41 / v58.11    All questions were answered. The patient knows to call the clinic with any problems, questions or concerns. I spent 45 minutes counseling the patient face to face. The total time spent in the appointment was 60 minutes and more than 50% was on counseling.     Truitt Merle, MD 03/22/2019   I, Joslyn Devon, am acting as scribe for Truitt Merle, MD.   I have reviewed the above documentation for accuracy and completeness, and I agree with the above.

## 2019-03-22 NOTE — H&P (Signed)
Chelsea Hobbs Documented: 03/22/2019 7:25 AM Location: [facility] Patient #: [id number] DOB: [birth year] Married / Language: English / Race: White Female  History of Present Illness Chelsea Moores A. 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 Pt denies mass, discharge or change in appearance of breasts. No pain or problem . Mother recently diagnosed with breast cancer.         CLINICAL DATA: Screening recall for a possible mass and possible asymmetry in the left breast. EXAM: DIGITAL DIAGNOSTIC LEFT MAMMOGRAM WITH CAD AND TOMO ULTRASOUND LEFT BREAST COMPARISON: Previous exam(s). ACR Breast Density Category c: The breast tissue is heterogeneously dense, which may obscure small masses. FINDINGS: On the diagnostic spot-compression images, the asymmetry, which projects in the posterior, medial aspect of the left breast, persists. It has irregular margins suspicious for an irregular mass. It measures 9 mm in greatest dimension. The other lesion noted on the screening study is not well-defined on the CC diagnostic spot-compression imaging, but is visualized on the spot-compression MLO image has a circumscribed oval mass, lower aspect of the left breast, measuring 1 cm in size. No other abnormalities. Mammographic images were processed with CAD. On physical exam, no discrete mass is palpated in the medial or lower aspect of the left breast. Targeted ultrasound is performed, showing a hypoechoic, irregular mass with partly ill-defined margins in the posterior aspect of the left breast at 10 o'clock, 3 cm the nipple, measuring 12 x 7 x 10 mm. This is consistent in size, shape and location to the irregular mass seen mammographically. In the 5:30 o'clock position of the left breast, 5 cm the nipple, superficial depth, there is an oval, circumscribed hypoechoic mass measuring 9 x 6 x 7 mm, consistent  with the inferior mass seen mammographically. Sonographic evaluation of the left axilla shows no enlarged or abnormal lymph nodes. IMPRESSION: 1. Suspicious mass in the 10 o'clock position of the left breast. Biopsy is indicated. 2. Indeterminate mass in the 5:30 o'clock position of the left breast. Although this may reflect a fibroadenoma, tissue sampling is recommended. RECOMMENDATION: 1. Ultrasound-guided core needle biopsy of the mass in the posterior left breast at 10 o'clock, 3 cm the nipple. 2. Ultrasound-guided core needle biopsy of the more superficial and more benign-appearing mass in the 5:30 o'clock position of the left breast. I have discussed the findings and recommendations with the patient. Results were also provided in writing at the conclusion of the visit. If applicable, a reminder letter will be sent to the patient regarding the next appointment. BI-RADS CATEGORY 4: Suspicious. Electronically Signed By: Lajean Manes M.D. On: 03/10/2019 15:52  Result History  MM DIAG BREAST TOMO UNI LEFT (Order #[id number]) on 03/10/2019 - Order Result History Report <[email or web address]>  US BREAST LTD UNI LEFT INC AXILLA (Order [id number]) - Reflex for Order [id number] Study Result  CLINICAL DATA: Screening recall for a possible mass and possible asymmetry in the left breast. EXAM: DIGITAL DIAGNOSTIC LEFT MAMMOGRAM WITH CAD AND TOMO ULTRASOUND LEFT BREAST COMPARISON: Previous exam(s). ACR Breast Density Category c: The breast tissue is heterogeneously dense, which may obscure small masses. FINDINGS: On the diagnostic spot-compression images, the asymmetry, which projects in the posterior, medial aspect of the left breast, persists. It has irregular margins suspicious for an irregular mass. It measures 9 mm in greatest dimension. The other lesion noted on the screening study is not well-defined on the CC diagnostic spot-compression  imaging, but is visualized on the  spot-compression MLO image has a circumscribed oval mass, lower aspect of the left breast, measuring 1 cm in size. No other abnormalities. Mammographic images were processed with CAD. On physical exam, no discrete mass is palpated in the medial or lower aspect of the left breast. Targeted ultrasound is performed, showing a hypoechoic, irregular mass with partly ill-defined margins in the posterior aspect of the left breast at 10 o'clock, 3 cm the nipple, measuring 12 x 7 x 10 mm. This is consistent in size, shape and location to the irregular mass seen mammographically. In the 5:30 o'clock position of the left breast, 5 cm the nipple, superficial depth, there is an oval, circumscribed hypoechoic mass measuring 9 x 6 x 7 mm, consistent with the inferior mass seen mammographically. Sonographic evaluation of the left axilla shows no enlarged or abnormal lymph nodes. IMPRESSION: 1. Suspicious mass in the 10 o'clock position of the left breast. Biopsy is indicated. 2. Indeterminate mass in the 5:30 o'clock position of the left breast. Although this may reflect a fibroadenoma, tissue sampling is recommended. RECOMMENDATION: 1. Ultrasound-guided core needle biopsy of the mass in the posterior left breast at 10 o'clock, 3 cm the nipple. 2. Ultrasound-guided core needle biopsy of the more superficial and more benign-appearing mass in the 5:30 o'clock position of the left breast. I have discussed the findings and recommendations with the patient. Results were also provided in writing at the conclusion of the visit. If applicable, a reminder letter will be sent to the patient regarding the next appointment. BI-RADS CATEGORY 4: Suspicious. Electronically Signed By: Lajean Manes M.D.  ADDITIONAL INFORMATION: 1. PROGNOSTIC INDICATORS Results: IMMUNOHISTOCHEMICAL AND MORPHOMETRIC ANALYSIS PERFORMED MANUALLY The tumor cells are POSITIVE for Her2 (3+). Estrogen Receptor: 100%, POSITIVE, STRONG  STAINING INTENSITY Progesterone Receptor: 100%, POSITIVE, STRONG STAINING INTENSITY Proliferation Marker Ki67: 60% REFERENCE RANGE ESTROGEN RECEPTOR NEGATIVE 0% POSITIVE =>1% REFERENCE RANGE PROGESTERONE RECEPTOR NEGATIVE 0% POSITIVE =>1% All controls stained appropriately Chelsea Cutter MD Pathologist, Electronic Signature ( Signed 03/21/2019) FINAL DIAGNOSIS Diagnosis 1. 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 SEE NOTE 2. Breast, left, needle core biopsy, 5:30 o'clock - FIBROADENOMA 1 of 3 FINAL for Chelsea Hobbs, Chelsea Hobbs [phone]) Diagnosis Note 1. Carcinoma measures 0.7 cm in greatest linear dimension. A breast prognostic profile is pending and will be reported in an addendum. Dr. Jeannie Done has reviewed the case and concurs with the above diagnosis. [facility] was notified on 03/17/2019. Jaquita Folds MD Pathologist, Electronic Signature (Case signed 03/17/2019) Specimen Gross and Clinical Information Specimen Comment 1. Time in formalin: 1320; cold ischemia time: 5 seconds 2. Time in formalin: 1330; cold ischemia time is 5 seconds Specimen(s) Obtained: 1. Breast, left, needle core biopsy, 10 o'clock 2. Breast, left, needle core biopsy, 5:30 o'clock Specimen Clinical Information 1. Mass C/F malignancy vs FA/papilloma 2. Mass - FA/papilloma, R/O malignancy Gross 1. Received in formalin (TIF 1:20pm, CIT ?) labeled with the patient's name and "Left breast 10:00" are five cores of tan yellow soft tissue ranging fom 0.7 to 2.2 cm, entirely submitted in one cassette. (AK 03/16/2019) 2. Received in formalin (TIF 1:30pm, CIT ?) labeled with the patient's name and "Left breast 5:30" are two cores of tan yellow soft tissue ranging from 1.6 to 1.8 cm, entirely submitted in one cassette. (AK 03/16/2019) Stain(s) used in Diagnosis: The following stain(s) were used in diagnosing the case: ER-ACIS, PR-ACIS, Her2 by  IHC, KI-67-ACIS. The  control(s) stained appropriately. Disclaimer Estrogen receptor (6F11), immunohistochemical stains are performed on formalin fixed, paraffin embedded tissue using a 3,3"-diaminobenzidine (DAB) chromogen and Leica Bond Autostainer System. The staining intensity of the nucleus is scored manually and is reported as the percentage of tumor cell nuclei demonstrating specific nuclear staining.Specimens are fixed in 10% Neutral Buffered Formalin for at least 6 hours and up to 72 hours. These tests have not be validated on decalcified tissue. Results should be interpreted with caution given the possibility of false negative results on decalcified specimens. IHC scores are reported using ASCO/CAP scoring criteria. An IHC Score of 0 or 1+ is NEGATIVE for HER2, 3+ is POSITIVE for HER2, and 2+ is EQUIVOCAL. Equivocal results are reflexed to either FISH or IHC testing. Specimens are fixed in 10% Neutral Buffered Formalin for at least 6 hours and up to 72 hours. These tests have not be validated on decalcified tissue. Results should be interpreted with caution given the possibility of false negative results on decalcified specimens. Ki-67 (MM1), immunohistochemical stains are performed on formalin fixed, paraffin embedded tissue using a 3,3"-diaminobenzidine (DAB) chromogen and Leica Bond Autostainer System. The staining intensity of the nucleus is scored manually and is reported as the percentage of tumor cell nuclei demonstrating specific nuclear staining.Specimens are fixed in 10% Neutral Buffered Formalin for at least 6 hours and up to 72 hours. These tests have not be validated on decalcified.  The patient is a 48 year old female.   Past Surgical History Tawni Pummel, RN; 03/22/2019 7:26 AM) Breast Biopsy Left. Oral Surgery  Diagnostic Studies History Tawni Pummel, RN; 03/22/2019 7:26 AM) Colonoscopy within last year Mammogram within last year Pap Smear 1-5 years  ago  Medication History Tawni Pummel, RN; 03/22/2019 7:26 AM) Medications Reconciled  Social History Tawni Pummel, RN; 03/22/2019 7:26 AM) Alcohol use Moderate alcohol use. Caffeine use Coffee. No drug use Tobacco use Never smoker.  Family History Tawni Pummel, RN; 03/22/2019 7:26 AM) Breast Cancer Family Members In General, Mother. Cancer Family Members In General. Cerebrovascular Accident Family Members In General. Hypertension Mother. Respiratory Condition Family Members In General.  Pregnancy / Birth History Tawni Pummel, RN; 03/22/2019 7:26 AM) Age at menarche 21 years. Contraceptive History Intrauterine device. Gravida 2 Irregular periods Length (months) of breastfeeding 3-6 Maternal age 73-30 Para 2  Other Problems Tawni Pummel, RN; 03/22/2019 7:26 AM) Kidney Stone Other disease, cancer, significant illness     Review of Systems Sunday Spillers Ledford RN; 03/22/2019 7:26 AM) General Not Present- Appetite Loss, Chills, Fatigue, Fever, Night Sweats, Weight Gain and Weight Loss. Skin Not Present- Change in Wart/Mole, Dryness, Hives, Jaundice, New Lesions, Non-Healing Wounds, Rash and Ulcer. HEENT Present- Seasonal Allergies. Not Present- Earache, Hearing Loss, Hoarseness, Nose Bleed, Oral Ulcers, Ringing in the Ears, Sinus Pain, Sore Throat, Visual Disturbances, Wears glasses/contact lenses and Yellow Eyes. Respiratory Not Present- Bloody sputum, Chronic Cough, Difficulty Breathing, Snoring and Wheezing. Breast Not Present- Breast Mass, Breast Pain, Nipple Discharge and Skin Changes. Cardiovascular Not Present- Chest Pain, Difficulty Breathing Lying Down, Leg Cramps, Palpitations, Rapid Heart Rate, Shortness of Breath and Swelling of Extremities. Gastrointestinal Not Present- Abdominal Pain, Bloating, Bloody Stool, Change in Bowel Habits, Chronic diarrhea, Constipation, Difficulty Swallowing, Excessive gas, Gets full quickly at meals, Hemorrhoids,  Indigestion, Nausea, Rectal Pain and Vomiting. Female Genitourinary Not Present- Frequency, Nocturia, Painful Urination, Pelvic Pain and Urgency. Musculoskeletal Not Present- Back Pain, Joint Pain, Joint Stiffness, Muscle Pain, Muscle Weakness and Swelling of Extremities. Neurological Not Present- Decreased Memory, Fainting, Headaches,  Numbness, Seizures, Tingling, Tremor, Trouble walking and Weakness. Psychiatric Not Present- Anxiety, Bipolar, Change in Sleep Pattern, Depression, Fearful and Frequent crying. Endocrine Not Present- Cold Intolerance, Excessive Hunger, Hair Changes, Heat Intolerance, Hot flashes and New Diabetes. Hematology Not Present- Blood Thinners, Easy Bruising, Excessive bleeding, Gland problems, HIV and Persistent Infections.   Physical Exam (Janya Eveland A. Zariel Capano MD; 03/22/2019 3:03 PM)  General Mental Status-Alert. General Appearance-Consistent with stated age. Hydration-Well hydrated. Voice-Normal.  Chest and Lung Exam Chest and lung exam reveals -quiet, even and easy respiratory effort with no use of accessory muscles and on auscultation, normal breath sounds, no adventitious sounds and normal vocal resonance. Inspection Chest Wall - Normal. Back - normal.  Breast Note: bruising left breast no masses bilaterally no discharge bilaterally  Cardiovascular Cardiovascular examination reveals -normal heart sounds, regular rate and rhythm with no murmurs and normal pedal pulses bilaterally.  Neurologic Neurologic evaluation reveals -alert and oriented x 3 with no impairment of recent or remote memory. Mental Status-Normal.  Musculoskeletal Normal Exam - Left-Upper Extremity Strength Normal and Lower Extremity Strength Normal. Normal Exam - Right-Upper Extremity Strength Normal and Lower Extremity Strength Normal.  Lymphatic Head & Neck  General Head & Neck Lymphatics: Bilateral - Description - Normal. Axillary  General Axillary Region:  Bilateral - Description - Normal. Tenderness - Non Tender.    Assessment & Plan (Swanson Farnell A. Neve Branscomb MD; 03/22/2019 3:05 PM)  BREAST CANCER, LEFT (C50.912) Impression: left breast lumpectomy with SLN mapping and port placement Risk of sentinel lymph node mapping include bleeding, infection, lymphedema, shoulder pain. stiffness, dye allergy. cosmetic deformity , blood clots, death, need for more surgery. Pt agrees to proceed. Risk of lumpectomy include bleeding, infection, seroma, more surgery, use of seed/wire, wound care, cosmetic deformity and the need for other treatments, death , blood clots, death. Pt agrees to proceed. Pt requires port placement for chemotherapy. Risk include bleeding, infection, pneumothorax, hemothorax, mediastinal injury, nerve injury , blood vessel injury, strke, blood clots, death, migration. embolization and need for additional procedures. Pt agrees to proceed.  Current Plans You are being scheduled for surgery- Our schedulers will call you.  You should hear from our office's scheduling department within 5 working days about the location, date, and time of surgery. We try to make accommodations for patient's preferences in scheduling surgery, but sometimes the OR schedule or the surgeon's schedule prevents [place] from making those accommodations.  If you have not heard from our office [phone]) in 5 working days, call the office and ask for your surgeon's nurse.  If you have other questions about your diagnosis, plan, or surgery, call the office and ask for your surgeon's nurse.  Pt Education - [facility] Breast Cancer Information Given - [facility] "Breast Journey" Package We discussed the staging and pathophysiology of breast cancer. We discussed all of the different options for treatment for breast cancer including surgery, chemotherapy, radiation therapy, Herceptin, and antiestrogen therapy. We discussed a sentinel lymph node biopsy as she does not appear to having lymph  node involvement right now. We discussed the performance of that with injection of radioactive tracer and blue dye. We discussed that she would have an incision underneath her axillary hairline. We discussed that there is a bout a 10-20% chance of having a positive node with a sentinel lymph node biopsy and we will await the permanent pathology to make any other first further decisions in terms of her treatment. One of these options might be to return to the operating room to perform  an axillary lymph node dissection. We discussed about a 1-2% risk lifetime of chronic shoulder pain as well as lymphedema associated with a sentinel lymph node biopsy. We discussed the options for treatment of the breast cancer which included lumpectomy versus a mastectomy. We discussed the performance of the lumpectomy with a wire placement. We discussed a 10-20% chance of a positive margin requiring reexcision in the operating room. We also discussed that she may need radiation therapy or antiestrogen therapy or both if she undergoes lumpectomy. We discussed the mastectomy and the postoperative care for that as well. We discussed that there is no difference in her survival whether she undergoes lumpectomy with radiation therapy or antiestrogen therapy versus a mastectomy. There is a slight difference in the local recurrence rate being 3-5% with lumpectomy and about 1% with a mastectomy. We discussed the risks of operation including bleeding, infection, possible reoperation. She understands her further therapy will be based on what her stages at the time of her operation.  Pt Education - [facility] breast cancer surgery: discussed with patient and provided information. Pt Education - [facility] Breast Biopsy HCI: discussed with patient and provided information. Use of a central venous catheter for intravenous therapy was discussed. Technique of catheter placement using ultrasound and fluoroscopy guidance was discussed. Risks such as  bleeding, infection, pneumothorax, catheter occlusion, reoperation, and other risks were discussed. I noted a good likelihood this will help address the problem. Questions were answered. The patient expressed understanding & wishes to proceed.

## 2019-03-22 NOTE — Therapy (Signed)
[place], [place], [address] Phone: [phone]   Fax:  [phone]  Physical Therapy Evaluation  Patient Details  Name: Chelsea Hobbs MRN: [id number] Date of Birth: 02-Mar-1971 Referring Provider (PT): Dr. Erroll Luna   Encounter Date: 03/22/2019  PT End of Session - 03/22/19 1725    Visit Number  1    Number of Visits  2    Date for PT Re-Evaluation  05/17/19    PT Start Time  1450    PT Stop Time  1520    PT Time Calculation (min)  30 min    Activity Tolerance  Patient tolerated treatment well       Past Medical History:  Diagnosis Date  . Asthma   . Family history of breast cancer   . Family history of lung cancer     Past Surgical History:  Procedure Laterality Date  . BREAST BIOPSY Right   . NASAL SINUS SURGERY      There were no vitals filed for this visit.   Subjective Assessment - 03/22/19 1709    Subjective  Patient reports she is here today to be seen by her medical team for her newly diagnosed left breast cancer.    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 It measures 1.2 cm and is located in the upper inner quadrant with a Ki67 of 60%.    Patient Stated Goals  Reduce lymphedema risk and elarn post op shoulder ROM HEP    Currently in Pain?  No/denies         [facility] PT Assessment - 03/22/19 0001      Assessment   Medical Diagnosis  Left breast cancer    Referring Provider (PT)  Dr. [name] Cornett    Onset Date/Surgical Date  03/06/19    Hand Dominance  Right    Prior Therapy  none      Precautions   Precautions  Other (comment)    Precaution Comments  active cancer      Restrictions   Weight Bearing Restrictions  No      Balance Screen   Has the patient fallen in the past 6 months  No    Has the patient had a decrease in activity level because of a fear of falling?   No     Is the patient reluctant to leave their home because of a fear of falling?   No      Home Environment   Living Environment  Private residence    Living Arrangements  Spouse/significant other    Available Help at Discharge  Family      Prior Function   Level of Independence  Independent    Vocation  Full time employment    Physiological scientist    Leisure  She does not exercise      Cognition   Overall Cognitive Status  Within Functional Limits for tasks assessed      Posture/Postural Control   Posture/Postural Control  Postural limitations    Postural Limitations  Rounded Shoulders;Forward head      ROM / Strength   AROM / PROM / Strength  AROM;Strength      AROM   AROM Assessment Site  Shoulder;Cervical    Right/Left Shoulder  Right;Left    Right Shoulder Extension  54 Degrees    Right Shoulder Flexion  154 Degrees  Right Shoulder ABduction  170 Degrees    Right Shoulder Internal Rotation  69 Degrees    Right Shoulder External Rotation  89 Degrees    Left Shoulder Extension  40 Degrees    Left Shoulder Flexion  151 Degrees    Left Shoulder ABduction  165 Degrees    Left Shoulder Internal Rotation  72 Degrees    Left Shoulder External Rotation  90 Degrees    Cervical Flexion  WNL    Cervical Extension  25% limited    Cervical - Right Side Bend  25% limited    Cervical - Left Side Bend  25% limited    Cervical - Right Rotation  25% limited    Cervical - Left Rotation  25% limited      Strength   Overall Strength  Within functional limits for tasks performed        LYMPHEDEMA/ONCOLOGY QUESTIONNAIRE - 03/22/19 1723      Type   Cancer Type  Left breast cancer      Lymphedema Assessments   Lymphedema Assessments  Upper extremities      Right Upper Extremity Lymphedema   10 cm Proximal to Olecranon Process  26.9 cm    Olecranon Process  23.5 cm    10 cm Proximal to Ulnar Styloid Process  20.4 cm    Just Proximal to Ulnar Styloid Process  14  cm    Across Hand at [employer]  17.5 cm    At [place] of 2nd Digit  5.8 cm      Left Upper Extremity Lymphedema   10 cm Proximal to Olecranon Process  25.6 cm    Olecranon Process  22.9 cm    10 cm Proximal to Ulnar Styloid Process  19.1 cm    Just Proximal to Ulnar Styloid Process  13.6 cm    Across Hand at [employer]  17.8 cm    At [place] of 2nd Digit  5.6 cm          Quick Dash - 03/22/19 0001    Open a tight or new jar  No difficulty    Do heavy household chores (wash walls, wash floors)  No difficulty    Carry a shopping bag or briefcase  No difficulty    Wash your back  No difficulty    Use a knife to cut food  No difficulty    Recreational activities in which you take some force or impact through your arm, shoulder, or hand (golf, hammering, tennis)  No difficulty    During the past week, to what extent has your arm, shoulder or hand problem interfered with your normal social activities with family, friends, neighbors, or groups?  Not at all    During the past week, to what extent has your arm, shoulder or hand problem limited your work or other regular daily activities  Not at all    Arm, shoulder, or hand pain.  None    Tingling (pins and needles) in your arm, shoulder, or hand  None    Difficulty Sleeping  No difficulty    DASH Score  0 %        Objective measurements completed on examination: See above findings.     Patient was instructed today in a home exercise program today for post op shoulder range of motion. These included active assist shoulder flexion in sitting, scapular retraction, wall walking with shoulder abduction, and hands behind head external rotation.  She  was encouraged to do these twice a day, holding 3 seconds and repeating 5 times when permitted by her physician.       PT Education - 03/22/19 1724    Education Details  Lymphedema risk reduction and post op shoulder ROM HEP    Person(s) Educated  Patient    Methods   Explanation;Demonstration;Handout    Comprehension  Verbalized understanding;Returned demonstration          PT Long Term Goals - 03/22/19 1731      PT LONG TERM GOAL #1   Title  Patient will demonstrate she has regained full shoulder ROM and function post operatively compared to baselines.    Time  8    Period  Weeks    Status  New    Target Date  05/17/19      Breast Clinic Goals - 03/22/19 1731      Patient will be able to verbalize understanding of pertinent lymphedema risk reduction practices relevant to her diagnosis specifically related to skin care.   Time  1    Period  Days    Status  Achieved      Patient will be able to return demonstrate and/or verbalize understanding of the post-op home exercise program related to regaining shoulder range of motion.   Time  1    Period  Days    Status  Achieved      Patient will be able to verbalize understanding of the importance of attending the postoperative After Breast Cancer Class for further lymphedema risk reduction education and therapeutic exercise.   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 It measures 1.2 cm and is located in the upper inner quadrant with a Ki67 of 60%. Her multidisciplinary medical team met prior ot her assessments to determine a recommended treatment plan. She is planning to have a left lumpectomy and sentinel node biopsy followed by chemotherapy, radiation, and anti-estrogren therapy. She will benefit from a post op PT reassessment to determine needs.    Stability/Clinical Decision Making  Stable/Uncomplicated    Clinical Decision Making  Low    Rehab Potential  Excellent    PT Frequency  --   eval and 1 f/u visit   PT Treatment/Interventions  ADLs/Self Care Home Management;Therapeutic exercise;Patient/family education     PT Next Visit Plan  Will reassess 3-4 weeks post op    PT Home Exercise Plan  Post op shoulder ROM HEP    Consulted and Agree with Plan of Care  Patient       Patient will benefit from skilled therapeutic intervention in order to improve the following deficits and impairments:  Postural dysfunction, Decreased range of motion, Decreased knowledge of precautions, Pain, Impaired UE functional use  Visit Diagnosis: Malignant neoplasm of upper-inner quadrant of left breast in female, estrogen receptor positive ([place]) - Plan: PT plan of care cert/re-cert  Abnormal posture - Plan: PT plan of care cert/re-cert   Patient will follow up at outpatient cancer rehab 3-4 weeks following surgery.  If the patient requires physical therapy at that time, a specific plan will be dictated and sent to the referring physician for approval. The patient was educated today on appropriate basic range of motion exercises to begin post  operatively and the importance of attending the After Breast Cancer class following surgery.  Patient was educated today on lymphedema risk reduction practices as it pertains to recommendations that will benefit the patient immediately following surgery.  She verbalized good understanding.     Problem List Patient Active Problem List   Diagnosis Date Noted  . Family history of breast cancer   . Family history of lung cancer   . Malignant neoplasm of upper-inner quadrant of left breast in female, estrogen receptor positive ([place]) 03/21/2019  . NASAL POLYP 09/22/2007  . ALLERGIC RHINITIS 09/22/2007  . ASTHMA 09/22/2007  . PERSONAL HISTORY OF ALLERGY TO LATEX 09/22/2007   Annia Friendly, PT 03/22/19 5:42 PM  [place], [place], [address] Phone: [phone]   Fax:  [phone]  Name: Chelsea Hobbs MRN: [id number] Date of Birth: [birth year]

## 2019-03-23 ENCOUNTER — Telehealth: Payer: Self-pay | Admitting: Hematology

## 2019-03-23 ENCOUNTER — Encounter: Payer: Self-pay | Admitting: General Practice

## 2019-03-23 NOTE — Telephone Encounter (Signed)
No los per 9/9. °

## 2019-03-23 NOTE — Progress Notes (Signed)
[facility] CSW Progress Notes  Request from nurse navigator to reach out to patient re financial stress related to cancer diagnosis.  Called patient, unable to reach, left generic VM as mailbox was not personally identified.  Edwyna Shell, LCSW Clinical Social Worker Phone:  [phone] Cell:  [phone]

## 2019-03-24 ENCOUNTER — Encounter: Payer: Self-pay | Admitting: General Practice

## 2019-03-24 ENCOUNTER — Other Ambulatory Visit: Payer: Self-pay | Admitting: Surgery

## 2019-03-24 DIAGNOSIS — C50912 Malignant neoplasm of unspecified site of left female breast: Secondary | ICD-10-CM

## 2019-03-24 DIAGNOSIS — Z170 Estrogen receptor positive status [ER+]: Secondary | ICD-10-CM

## 2019-03-24 NOTE — Progress Notes (Signed)
[facility] Initial Psychosocial Assessment Clinical Social Work  Clinical Social Work contacted by phone to assess psychosocial, emotional, mental health, and spiritual needs of the patient.   Barriers to care/review of distress screen:  - Transportation:  Do you anticipate any problems getting to appointments?  Do you have someone who can help run errands for you if you need it? No concerns - Help at home:  What is your living situation (alone, family, other)?  If you are physically unable to care for yourself, who would you call on to help you?  Lives w family - adequate support - Support system:  What does your support system look like?  Who would you call on if you needed some kind of practical help?  What if you needed someone to talk to for emotional support? - Family and faith community - Finances:  Are you concerned about finances.   Considering returning to work?  If not, applying for disability?  Wants to work as much as possible throughout treatment as her income is important to family - has two children in college at this time.   What is your understanding of where you are with your cancer? Its cause?  Your treatment plan and what happens next?  Mother currently dealing with breast cancer, "but it is not like mine."  Pt thought she would have the same treatment plan as mother, but has learned that her cancer is more aggressive and requires more intervention.  Anticipates surgery, chemo and radiation.  Unclear in size and lymph node involvement which may result in changes.  Dealing w anxiety and uncertainty as she is in initial stages of learning about her cancer and its implications for her.  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   What are your worries for the future as you begin treatment for cancer? 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  Wants to try to work as much as she can throughout treatment.  "My family is my top priority, if I weren't worried about money, it would be easier."    CSW Summary:  Patient and family psychosocial functioning including strengths, limitations, and coping skills:  New diagnosis of breast cancer, concerned about its aggressiveness as compared to her mother who was also diagnosed w less aggressive form of breast cancer.  Financial strain prior to diagnosis due to children in college and other bills.  Concerned about impact on her ability to continue to work and provide income to family.  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    Identifications of barriers to care: none at this time  Availability of community resources: [place] in [place], [place] - information emailed to patient.  Also provided information on Support Center resources.    Clinical Social Worker follow up needed: Yes.    Follow up on financial stress and anxiety related to current illness.  Edwyna Shell, LCSW Clinical Social Worker Phone:  [phone]

## 2019-03-24 NOTE — Progress Notes (Signed)
[facility] CSW Progress Notes  Request from Bary Castilla, nurse navigator, to reach out to patient regarding needs. Called patient who was at work, will call after work.  Emailed information packet for her review.  Awaiting return call.  Chelsea Shell, LCSW Clinical Social Worker Phone:  [phone]

## 2019-03-28 ENCOUNTER — Encounter (HOSPITAL_BASED_OUTPATIENT_CLINIC_OR_DEPARTMENT_OTHER): Payer: Self-pay | Admitting: *Deleted

## 2019-03-28 ENCOUNTER — Other Ambulatory Visit: Payer: Self-pay

## 2019-03-29 ENCOUNTER — Other Ambulatory Visit (HOSPITAL_COMMUNITY): Payer: BC Managed Care – PPO

## 2019-03-30 ENCOUNTER — Telehealth: Payer: Self-pay | Admitting: Hematology

## 2019-03-30 ENCOUNTER — Telehealth: Payer: Self-pay | Admitting: *Deleted

## 2019-03-30 NOTE — Telephone Encounter (Signed)
Scheduled appt per 9/17 sch message - pt aware of apt date and time

## 2019-03-30 NOTE — Telephone Encounter (Signed)
Left vm regarding [facility] from 9.9.20. Contact information provided for questions or needs.

## 2019-03-31 ENCOUNTER — Telehealth: Payer: Self-pay | Admitting: Licensed Clinical Social Worker

## 2019-03-31 ENCOUNTER — Ambulatory Visit (HOSPITAL_COMMUNITY): Payer: BC Managed Care – PPO

## 2019-03-31 ENCOUNTER — Ambulatory Visit: Payer: Self-pay | Admitting: Licensed Clinical Social Worker

## 2019-03-31 ENCOUNTER — Ambulatory Visit (HOSPITAL_COMMUNITY)
Admission: RE | Admit: 2019-03-31 | Discharge: 2019-03-31 | Disposition: A | Discharge status: Home / Self Care | Payer: BC Managed Care – PPO | Source: Ambulatory Visit | Attending: Hematology | Admitting: Hematology

## 2019-03-31 ENCOUNTER — Other Ambulatory Visit (HOSPITAL_COMMUNITY)
Admission: RE | Admit: 2019-03-31 | Discharge: 2019-03-31 | Disposition: A | Discharge status: Home / Self Care | Payer: BC Managed Care – PPO | Source: Ambulatory Visit | Attending: Surgery | Admitting: Surgery

## 2019-03-31 ENCOUNTER — Encounter: Payer: Self-pay | Admitting: Licensed Clinical Social Worker

## 2019-03-31 ENCOUNTER — Other Ambulatory Visit (HOSPITAL_COMMUNITY): Payer: BC Managed Care – PPO

## 2019-03-31 ENCOUNTER — Other Ambulatory Visit: Payer: Self-pay

## 2019-03-31 DIAGNOSIS — C50212 Malignant neoplasm of upper-inner quadrant of left female breast: Secondary | ICD-10-CM

## 2019-03-31 DIAGNOSIS — Z170 Estrogen receptor positive status [ER+]: Secondary | ICD-10-CM

## 2019-03-31 DIAGNOSIS — Z20828 Contact with and (suspected) exposure to other viral communicable diseases: Secondary | ICD-10-CM | POA: Insufficient documentation

## 2019-03-31 DIAGNOSIS — Z801 Family history of malignant neoplasm of trachea, bronchus and lung: Secondary | ICD-10-CM

## 2019-03-31 DIAGNOSIS — Z803 Family history of malignant neoplasm of breast: Secondary | ICD-10-CM

## 2019-03-31 DIAGNOSIS — Z1379 Encounter for other screening for genetic and chromosomal anomalies: Secondary | ICD-10-CM | POA: Insufficient documentation

## 2019-03-31 DIAGNOSIS — N6315 Unspecified lump in the right breast, overlapping quadrants: Secondary | ICD-10-CM | POA: Diagnosis not present

## 2019-03-31 MED ORDER — GADOBUTROL 1 MMOL/ML IV SOLN
6.0000 mL | Freq: Once | INTRAVENOUS | Status: AC | PRN
  Administered 2019-03-31: 6 mL via INTRAVENOUS

## 2019-03-31 NOTE — Telephone Encounter (Signed)
Revealed negative genetic testing.  Revealed that a VUS in CTNNA1 was identified. This normal result is reassuring and indicates that it is unlikely Chelsea Hobbs's cancer is due to a hereditary cause.  It is unlikely that there is an increased risk of another cancer due to a mutation in one of these genes.  However, genetic testing is not perfect, and cannot definitively rule out a hereditary cause.  It will be important for her to keep in contact with genetics to learn if any additional testing may be needed in the future.

## 2019-03-31 NOTE — Progress Notes (Signed)
HPI:  Chelsea Hobbs was previously seen in the [facility] clinic due to a personal and family history of cancer and concerns regarding a hereditary predisposition to cancer. Please refer to our prior cancer genetics clinic note for more information regarding our discussion, assessment and recommendations, at the time. Chelsea Hobbs recent genetic test results were disclosed to her, as were recommendations warranted by these results. These results and recommendations are discussed in more detail below.  CANCER HISTORY:  Oncology History Overview Note  Cancer Staging Malignant neoplasm of upper-inner quadrant of left breast in female, estrogen receptor positive ([place]) Staging form: Breast, AJCC 8th Edition - Clinical stage from 03/22/2019: Stage IA (cT1c, cN0, cM0, G3, ER+, PR+, HER2+) - Signed by Truitt Merle, MD on 03/22/2019    Malignant neoplasm of upper-inner quadrant of left breast in female, estrogen receptor positive ([place])  03/10/2019 Mammogram   Diagnostic Mammogram 03/10/19  IMPRESSION: 1. Suspicious mass in the 10 o'clock position of the left breast, 3cm from the nipple measuring 12x7x77m. Biopsy is indicated. 2. Indeterminate mass in the 5:30 o'clock position of the left breast, 5cm from the nipple, measuring 9x6x733m Although this may reflect a fibroadenoma, tissue sampling is recommended.   03/16/2019 Initial Biopsy   Diagnosis 03/16/19  1. 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 SEE NOTE 2. Breast, left, needle core biopsy, 5:30 o'clock - FIBROADENOMA   03/16/2019 Receptors her2   Results: IMMUNOHISTOCHEMICAL AND MORPHOMETRIC ANALYSIS PERFORMED MANUALLY The tumor cells are POSITIVE for Her2 (3+). Estrogen Receptor: 100%, POSITIVE, STRONG STAINING INTENSITY Progesterone Receptor: 100%, POSITIVE, STRONG STAINING INTENSITY Proliferation Marker Ki67: 60%   03/21/2019 Initial Diagnosis   Malignant neoplasm of upper-inner quadrant of left  breast in female, estrogen receptor positive ([place]  03/22/2019 Cancer Staging   Staging form: Breast, AJCC 8th Edition - Clinical stage from 03/22/2019: Stage IA (cT1c, cN0, cM0, G3, ER+, PR+, HER2+) - Signed by FeTruitt MerleMD on 03/22/2019   03/31/2019 Genetic Testing   VUS in CTNNA1 called c.1547-3C>T (Intronic) was identified on the [facility] Common Hereditary Cancers Panel. The Common Hereditary Cancers Panel offered by [facility] includes sequencing and/or deletion duplication testing of the following 47 genes: APC, ATM, AXIN2, BARD1, BMPR1A, BRCA1, BRCA2, BRIP1, CDH1, CDKN2A (p14ARF), CDKN2A (p16INK4a), CKD4, CHEK2, CTNNA1, DICER1, EPCAM (Deletion/duplication testing only), GREM1 (promoter region deletion/duplication testing only), KIT, MEN1, MLH1, MSH2, MSH3, MSH6, MUTYH, NBN, NF1, NHTL1, PALB2, PDGFRA, PMS2, POLD1, POLE, PTEN, RAD50, RAD51C, RAD51D, SDHB, SDHC, SDHD, SMAD4, SMARCA4. STK11, TP53, TSC1, TSC2, and VHL.  The following genes were evaluated for sequence changes only: SDHA and HOXB13 c.251G>A variant only. The report date is 03/30/2019.      FAMILY HISTORY:  We obtained a detailed, 4-generation family history.  Significant diagnoses are listed below: Family History  Problem Relation Age of Onset   Breast cancer Mother 7841 Cancer Maternal Grandmother 8529     lung cancer   Cancer Maternal Grandfather 5633     lung cancer   Breast cancer Paternal Grandmother 6522 Chelsea Hobbs has a son, 2056and a daughter, 2235no cancers. She has one brother, 5969no history of cancer.   Chelsea Hobbs was recently diagnosed with breast cancer at [age]Patient's mother was an only child. Patient's maternal grandmother died from lung cancer in her 8060sgrandfather died from lung cancer in his 502s  Chelsea Hobbs father is living at 7860he has  limited information about this side of the family. She knows of at least 2 paternal uncles, 1 paternal aunt. No cancers she is aware of. Her paternal grandmother  had breast cancer, she believes in her 82s.   Chelsea Hobbs is unaware of previous family history of genetic testing for hereditary cancer risks. There is no reported Ashkenazi Jewish ancestry. There is no known consanguinity.   GENETIC TEST RESULTS: Genetic testing reported out on 03/30/2019 through the [facility] Common Hereditary cancer panel found no pathogenic mutations. The Common Hereditary Cancers Panel offered by [facility] includes sequencing and/or deletion duplication testing of the following 47 genes: APC, ATM, AXIN2, BARD1, BMPR1A, BRCA1, BRCA2, BRIP1, CDH1, CDKN2A (p14ARF), CDKN2A (p16INK4a), CKD4, CHEK2, CTNNA1, DICER1, EPCAM (Deletion/duplication testing only), GREM1 (promoter region deletion/duplication testing only), KIT, MEN1, MLH1, MSH2, MSH3, MSH6, MUTYH, NBN, NF1, NHTL1, PALB2, PDGFRA, PMS2, POLD1, POLE, PTEN, RAD50, RAD51C, RAD51D,  SDHB, SDHC, SDHD, SMAD4, SMARCA4. STK11, TP53, TSC1, TSC2, and VHL.  The following genes were evaluated for sequence changes only: SDHA and HOXB13 c.251G>A variant only.The test report has been scanned into EPIC and is located under the Molecular Pathology section of the Results Review tab.  A portion of the result report is included below for reference.     We discussed with Chelsea Hobbs that because current genetic testing is not perfect, it is possible there may be a gene mutation in one of these genes that current testing cannot detect, but that chance is small.  We also discussed, that there could be another gene that has not yet been discovered, or that we have not yet tested, that is responsible for the cancer diagnoses in the family. It is also possible there is a hereditary cause for the cancer in the family that Chelsea Hobbs did not inherit and therefore was not identified in her testing.  Therefore, it is important to remain in touch with cancer genetics in the future so that we can continue to offer Chelsea Hobbs the most up to date genetic testing.   Genetic  testing did identify a variant of uncertain significance (VUS) was identified in the CTNNA1 gene called c.1547-3C>T (Intronic).  At this time, it is unknown if this variant is associated with increased cancer risk or if this is a normal finding, but most variants such as this get reclassified to being inconsequential. It should not be used to make medical management decisions. With time, we suspect the lab will determine the significance of this variant, if any. If we do learn more about it, we will try to contact Chelsea Hobbs to discuss it further. However, it is important to stay in touch with [place] periodically and keep the address and phone number up to date.  ADDITIONAL GENETIC TESTING: We discussed with Chelsea Hobbs that her genetic testing was fairly extensive.  If there are genes identified to increase cancer risk that can be analyzed in the future, we would be happy to discuss and coordinate this testing at that time.    CANCER SCREENING RECOMMENDATIONS: Chelsea Hobbs test result is considered negative (normal).  This means that we have not identified a hereditary cause for her  personal and family history of cancer at this time. Most cancers happen by chance and this negative test suggests that her cancer may fall into this category.    While reassuring, this does not definitively rule out a hereditary predisposition to cancer. It is still possible that there could be genetic mutations that are undetectable by current technology.  There could be genetic mutations in genes that have not been tested or identified to increase cancer risk.  Therefore, it is recommended she continue to follow the cancer management and screening guidelines provided by her oncology and primary healthcare provider.   An individual's cancer risk and medical management are not determined by genetic test results alone. Overall cancer risk assessment incorporates additional factors, including personal medical history, family history, and any  available genetic information that may result in a personalized plan for cancer prevention and surveillance.  RECOMMENDATIONS FOR FAMILY MEMBERS:  Relatives in this family might be at some increased risk of developing cancer, over the general population risk, simply due to the family history of cancer.  We recommended female relatives in this family have a yearly mammogram beginning at age 46, or 8 years younger than the earliest onset of cancer, an annual clinical breast exam, and perform monthly breast self-exams. Female relatives in this family should also have a gynecological exam as recommended by their primary provider. All family members should have a colonoscopy by age 28, or as directed by their physicians.  FOLLOW-UP: Lastly, we discussed with Chelsea Hobbs that cancer genetics is a rapidly advancing field and it is possible that new genetic tests will be appropriate for her and/or her family members in the future. We encouraged her to remain in contact with cancer genetics on an annual basis so we can update her personal and family histories and let her know of advances in cancer genetics that may benefit this family.   Our contact number was provided. Chelsea Hobbs questions were answered to her satisfaction, and she knows she is welcome to call us at anytime with additional questions or concerns.   Faith Rogue, MS, [facility] Genetic Counselor [email or web address] Phone: [phone]

## 2019-04-01 LAB — NOVEL CORONAVIRUS, NAA (HOSP ORDER, SEND-OUT TO REF LAB; TAT 18-24 HRS): SARS-CoV-2, NAA: NOT DETECTED

## 2019-04-03 ENCOUNTER — Other Ambulatory Visit: Payer: Self-pay

## 2019-04-03 ENCOUNTER — Ambulatory Visit (HOSPITAL_COMMUNITY)
Admission: RE | Admit: 2019-04-03 | Discharge: 2019-04-03 | Disposition: A | Discharge status: Home / Self Care | Payer: BC Managed Care – PPO | Source: Ambulatory Visit | Attending: Hematology | Admitting: Hematology

## 2019-04-03 DIAGNOSIS — Z793 Long term (current) use of hormonal contraceptives: Secondary | ICD-10-CM | POA: Diagnosis not present

## 2019-04-03 DIAGNOSIS — Z30432 Encounter for removal of intrauterine contraceptive device: Secondary | ICD-10-CM | POA: Diagnosis not present

## 2019-04-03 DIAGNOSIS — C50212 Malignant neoplasm of upper-inner quadrant of left female breast: Secondary | ICD-10-CM | POA: Diagnosis not present

## 2019-04-03 DIAGNOSIS — M816 Localized osteoporosis [Lequesne]: Secondary | ICD-10-CM | POA: Diagnosis not present

## 2019-04-03 DIAGNOSIS — Z79899 Other long term (current) drug therapy: Secondary | ICD-10-CM | POA: Diagnosis not present

## 2019-04-03 DIAGNOSIS — Z170 Estrogen receptor positive status [ER+]: Secondary | ICD-10-CM | POA: Diagnosis not present

## 2019-04-03 DIAGNOSIS — M810 Age-related osteoporosis without current pathological fracture: Secondary | ICD-10-CM | POA: Diagnosis not present

## 2019-04-03 DIAGNOSIS — C50919 Malignant neoplasm of unspecified site of unspecified female breast: Secondary | ICD-10-CM | POA: Diagnosis not present

## 2019-04-03 DIAGNOSIS — Z6822 Body mass index (BMI) 22.0-22.9, adult: Secondary | ICD-10-CM | POA: Diagnosis not present

## 2019-04-03 DIAGNOSIS — Z01419 Encounter for gynecological examination (general) (routine) without abnormal findings: Secondary | ICD-10-CM | POA: Diagnosis not present

## 2019-04-03 NOTE — Progress Notes (Signed)

## 2019-04-03 NOTE — Progress Notes (Signed)
  Echocardiogram 2D Echocardiogram has been performed.  Chelsea Hobbs 04/03/2019, 2:15 PM

## 2019-04-04 ENCOUNTER — Other Ambulatory Visit: Payer: Self-pay

## 2019-04-04 ENCOUNTER — Ambulatory Visit (HOSPITAL_COMMUNITY): Payer: BC Managed Care – PPO

## 2019-04-04 ENCOUNTER — Ambulatory Visit (HOSPITAL_BASED_OUTPATIENT_CLINIC_OR_DEPARTMENT_OTHER): Payer: BC Managed Care – PPO | Admitting: Certified Registered"

## 2019-04-04 ENCOUNTER — Encounter (HOSPITAL_BASED_OUTPATIENT_CLINIC_OR_DEPARTMENT_OTHER): Payer: Self-pay | Admitting: *Deleted

## 2019-04-04 ENCOUNTER — Ambulatory Visit
Admission: RE | Admit: 2019-04-04 | Discharge: 2019-04-04 | Disposition: A | Discharge status: Home / Self Care | Payer: BC Managed Care – PPO | Source: Ambulatory Visit | Attending: Surgery | Admitting: Surgery

## 2019-04-04 ENCOUNTER — Encounter (HOSPITAL_BASED_OUTPATIENT_CLINIC_OR_DEPARTMENT_OTHER)
Admission: RE | Disposition: A | Discharge status: Home / Self Care | Payer: Self-pay | Source: Home / Self Care | Attending: Surgery

## 2019-04-04 ENCOUNTER — Ambulatory Visit (HOSPITAL_BASED_OUTPATIENT_CLINIC_OR_DEPARTMENT_OTHER)
Admission: RE | Admit: 2019-04-04 | Discharge: 2019-04-04 | Disposition: A | Discharge status: Home / Self Care | Payer: BC Managed Care – PPO | Attending: Surgery | Admitting: Surgery

## 2019-04-04 ENCOUNTER — Ambulatory Visit (HOSPITAL_COMMUNITY)
Admission: RE | Admit: 2019-04-04 | Discharge: 2019-04-04 | Disposition: A | Discharge status: Home / Self Care | Payer: BC Managed Care – PPO | Source: Ambulatory Visit | Attending: Surgery | Admitting: Surgery

## 2019-04-04 DIAGNOSIS — C50212 Malignant neoplasm of upper-inner quadrant of left female breast: Secondary | ICD-10-CM | POA: Insufficient documentation

## 2019-04-04 DIAGNOSIS — Z803 Family history of malignant neoplasm of breast: Secondary | ICD-10-CM | POA: Insufficient documentation

## 2019-04-04 DIAGNOSIS — Z452 Encounter for adjustment and management of vascular access device: Secondary | ICD-10-CM | POA: Diagnosis not present

## 2019-04-04 DIAGNOSIS — Z170 Estrogen receptor positive status [ER+]: Secondary | ICD-10-CM

## 2019-04-04 DIAGNOSIS — N6012 Diffuse cystic mastopathy of left breast: Secondary | ICD-10-CM | POA: Diagnosis not present

## 2019-04-04 DIAGNOSIS — Z881 Allergy status to other antibiotic agents status: Secondary | ICD-10-CM | POA: Insufficient documentation

## 2019-04-04 DIAGNOSIS — J45909 Unspecified asthma, uncomplicated: Secondary | ICD-10-CM | POA: Diagnosis not present

## 2019-04-04 DIAGNOSIS — Z95828 Presence of other vascular implants and grafts: Secondary | ICD-10-CM

## 2019-04-04 DIAGNOSIS — C50912 Malignant neoplasm of unspecified site of left female breast: Secondary | ICD-10-CM | POA: Diagnosis not present

## 2019-04-04 DIAGNOSIS — Z419 Encounter for procedure for purposes other than remedying health state, unspecified: Secondary | ICD-10-CM

## 2019-04-04 DIAGNOSIS — N6320 Unspecified lump in the left breast, unspecified quadrant: Secondary | ICD-10-CM | POA: Diagnosis not present

## 2019-04-04 DIAGNOSIS — F419 Anxiety disorder, unspecified: Secondary | ICD-10-CM | POA: Insufficient documentation

## 2019-04-04 DIAGNOSIS — G8918 Other acute postprocedural pain: Secondary | ICD-10-CM | POA: Diagnosis not present

## 2019-04-04 HISTORY — PX: PORTACATH PLACEMENT: SHX2246

## 2019-04-04 HISTORY — PX: BREAST LUMPECTOMY WITH RADIOACTIVE SEED AND SENTINEL LYMPH NODE BIOPSY: SHX6550

## 2019-04-04 HISTORY — PX: BREAST LUMPECTOMY: SHX2

## 2019-04-04 LAB — POCT PREGNANCY, URINE: Preg Test, Ur: NEGATIVE

## 2019-04-04 SURGERY — BREAST LUMPECTOMY WITH RADIOACTIVE SEED AND SENTINEL LYMPH NODE BIOPSY
Anesthesia: General | Site: Chest

## 2019-04-04 MED ORDER — LACTATED RINGERS IV SOLN
INTRAVENOUS | Status: DC
  Administered 2019-04-04 (×2): via INTRAVENOUS

## 2019-04-04 MED ORDER — PHENYLEPHRINE HCL (PRESSORS) 10 MG/ML IV SOLN
INTRAVENOUS | Status: DC | PRN
  Administered 2019-04-04 (×2): 120 ug via INTRAVENOUS

## 2019-04-04 MED ORDER — ACETAMINOPHEN 500 MG PO TABS
ORAL_TABLET | ORAL | Status: AC
  Filled 2019-04-04: qty 2

## 2019-04-04 MED ORDER — MIDAZOLAM HCL 2 MG/2ML IJ SOLN
1.0000 mg | INTRAMUSCULAR | Status: DC | PRN
  Administered 2019-04-04: 2 mg via INTRAVENOUS

## 2019-04-04 MED ORDER — OXYCODONE HCL 5 MG PO TABS: 5.0000 mg | ORAL_TABLET | Freq: Four times a day (QID) | ORAL | 0 refills | Status: DC | PRN

## 2019-04-04 MED ORDER — CHLORHEXIDINE GLUCONATE CLOTH 2 % EX PADS: 6.0000 | MEDICATED_PAD | Freq: Once | CUTANEOUS | Status: DC

## 2019-04-04 MED ORDER — HEPARIN (PORCINE) IN NACL 2-0.9 UNITS/ML
INTRAMUSCULAR | Status: AC | PRN
  Administered 2019-04-04: 500 mL

## 2019-04-04 MED ORDER — CEFAZOLIN SODIUM-DEXTROSE 2-4 GM/100ML-% IV SOLN
INTRAVENOUS | Status: AC
  Filled 2019-04-04: qty 100

## 2019-04-04 MED ORDER — FENTANYL CITRATE (PF) 100 MCG/2ML IJ SOLN
INTRAMUSCULAR | Status: AC
  Filled 2019-04-04: qty 2

## 2019-04-04 MED ORDER — FENTANYL CITRATE (PF) 100 MCG/2ML IJ SOLN
50.0000 ug | INTRAMUSCULAR | Status: AC | PRN
  Administered 2019-04-04: 100 ug via INTRAVENOUS
  Administered 2019-04-04: 25 ug via INTRAVENOUS
  Administered 2019-04-04: 100 ug via INTRAVENOUS

## 2019-04-04 MED ORDER — GABAPENTIN 300 MG PO CAPS
ORAL_CAPSULE | ORAL | Status: AC
  Filled 2019-04-04: qty 1

## 2019-04-04 MED ORDER — PROPOFOL 10 MG/ML IV BOLUS
INTRAVENOUS | Status: AC
  Filled 2019-04-04: qty 20

## 2019-04-04 MED ORDER — CEFAZOLIN SODIUM-DEXTROSE 2-4 GM/100ML-% IV SOLN
2.0000 g | INTRAVENOUS | Status: AC
  Administered 2019-04-04: 2 g via INTRAVENOUS

## 2019-04-04 MED ORDER — MIDAZOLAM HCL 2 MG/2ML IJ SOLN
INTRAMUSCULAR | Status: AC
  Filled 2019-04-04: qty 2

## 2019-04-04 MED ORDER — BUPIVACAINE-EPINEPHRINE (PF) 0.5% -1:200000 IJ SOLN
INTRAMUSCULAR | Status: DC | PRN
  Administered 2019-04-04: 30 mL via PERINEURAL

## 2019-04-04 MED ORDER — TECHNETIUM TC 99M SULFUR COLLOID FILTERED
1.0000 | Freq: Once | INTRAVENOUS | Status: AC | PRN
  Administered 2019-04-04: 15:00:00 1 via INTRADERMAL

## 2019-04-04 MED ORDER — CELECOXIB 200 MG PO CAPS
ORAL_CAPSULE | ORAL | Status: AC
  Filled 2019-04-04: qty 2

## 2019-04-04 MED ORDER — CELECOXIB 400 MG PO CAPS
400.0000 mg | ORAL_CAPSULE | ORAL | Status: AC
  Administered 2019-04-04: 400 mg via ORAL

## 2019-04-04 MED ORDER — ONDANSETRON HCL 4 MG/2ML IJ SOLN
INTRAMUSCULAR | Status: AC
  Filled 2019-04-04: qty 2

## 2019-04-04 MED ORDER — OXYCODONE HCL 5 MG PO TABS
ORAL_TABLET | ORAL | Status: AC
  Filled 2019-04-04: qty 1

## 2019-04-04 MED ORDER — FENTANYL CITRATE (PF) 100 MCG/2ML IJ SOLN
25.0000 ug | INTRAMUSCULAR | Status: DC | PRN
  Administered 2019-04-04: 50 ug via INTRAVENOUS

## 2019-04-04 MED ORDER — LIDOCAINE 2% (20 MG/ML) 5 ML SYRINGE
INTRAMUSCULAR | Status: AC
  Filled 2019-04-04: qty 5

## 2019-04-04 MED ORDER — LIDOCAINE 2% (20 MG/ML) 5 ML SYRINGE
INTRAMUSCULAR | Status: DC | PRN
  Administered 2019-04-04: 100 mg via INTRAVENOUS

## 2019-04-04 MED ORDER — DEXAMETHASONE SODIUM PHOSPHATE 4 MG/ML IJ SOLN
INTRAMUSCULAR | Status: DC | PRN
  Administered 2019-04-04: 10 mg via INTRAVENOUS

## 2019-04-04 MED ORDER — EPHEDRINE 5 MG/ML INJ
INTRAVENOUS | Status: AC
  Filled 2019-04-04: qty 40

## 2019-04-04 MED ORDER — GABAPENTIN 300 MG PO CAPS
300.0000 mg | ORAL_CAPSULE | ORAL | Status: AC
  Administered 2019-04-04: 300 mg via ORAL

## 2019-04-04 MED ORDER — BUPIVACAINE-EPINEPHRINE (PF) 0.5% -1:200000 IJ SOLN
INTRAMUSCULAR | Status: DC | PRN
  Administered 2019-04-04: 18 mL

## 2019-04-04 MED ORDER — ACETAMINOPHEN 500 MG PO TABS
1000.0000 mg | ORAL_TABLET | ORAL | Status: AC
  Administered 2019-04-04: 1000 mg via ORAL

## 2019-04-04 MED ORDER — ONDANSETRON HCL 4 MG/2ML IJ SOLN
INTRAMUSCULAR | Status: DC | PRN
  Administered 2019-04-04: 4 mg via INTRAVENOUS

## 2019-04-04 MED ORDER — PROPOFOL 10 MG/ML IV BOLUS
INTRAVENOUS | Status: DC | PRN
  Administered 2019-04-04: 200 mg via INTRAVENOUS

## 2019-04-04 MED ORDER — PROMETHAZINE HCL 25 MG/ML IJ SOLN: 6.2500 mg | INTRAMUSCULAR | Status: DC | PRN

## 2019-04-04 MED ORDER — OXYCODONE HCL 5 MG PO TABS
5.0000 mg | ORAL_TABLET | Freq: Once | ORAL | Status: AC
  Administered 2019-04-04: 5 mg via ORAL

## 2019-04-04 MED ORDER — IBUPROFEN 800 MG PO TABS: 800.0000 mg | ORAL_TABLET | Freq: Three times a day (TID) | ORAL | 0 refills | Status: DC | PRN

## 2019-04-04 MED ORDER — HEPARIN SOD (PORK) LOCK FLUSH 100 UNIT/ML IV SOLN
INTRAVENOUS | Status: DC | PRN
  Administered 2019-04-04: 500 [IU]

## 2019-04-04 SURGICAL SUPPLY — 68 items
APPLIER CLIP 9.375 MED OPEN (MISCELLANEOUS) ×3
BAG DECANTER FOR FLEXI CONT (MISCELLANEOUS) ×3 IMPLANT
BENZOIN TINCTURE PRP APPL 2/3 (GAUZE/BANDAGES/DRESSINGS) IMPLANT
BINDER BREAST LRG (GAUZE/BANDAGES/DRESSINGS) IMPLANT
BINDER BREAST MEDIUM (GAUZE/BANDAGES/DRESSINGS) ×3 IMPLANT
BINDER BREAST XLRG (GAUZE/BANDAGES/DRESSINGS) IMPLANT
BINDER BREAST XXLRG (GAUZE/BANDAGES/DRESSINGS) IMPLANT
BLADE HEX COATED 2.75 (ELECTRODE) ×3 IMPLANT
BLADE SURG 11 STRL SS (BLADE) ×3 IMPLANT
BLADE SURG 15 STRL LF DISP TIS (BLADE) ×2 IMPLANT
BLADE SURG 15 STRL SS (BLADE) ×1
CANISTER SUC SOCK COL 7IN (MISCELLANEOUS) IMPLANT
CANISTER SUCT 1200ML W/VALVE (MISCELLANEOUS) ×3 IMPLANT
CHLORAPREP W/TINT 26 (MISCELLANEOUS) ×3 IMPLANT
CLIP APPLIE 9.375 MED OPEN (MISCELLANEOUS) ×2 IMPLANT
COVER BACK TABLE REUSABLE LG (DRAPES) ×3 IMPLANT
COVER MAYO STAND REUSABLE (DRAPES) ×3 IMPLANT
COVER PROBE 5X48 (MISCELLANEOUS) ×1
COVER PROBE W GEL 5X96 (DRAPES) ×3 IMPLANT
COVER WAND RF STERILE (DRAPES) IMPLANT
DECANTER SPIKE VIAL GLASS SM (MISCELLANEOUS) IMPLANT
DERMABOND ADVANCED (GAUZE/BANDAGES/DRESSINGS) ×1
DERMABOND ADVANCED .7 DNX12 (GAUZE/BANDAGES/DRESSINGS) ×2 IMPLANT
DRAPE C-ARM 42X72 X-RAY (DRAPES) ×3 IMPLANT
DRAPE LAPAROSCOPIC ABDOMINAL (DRAPES) ×3 IMPLANT
DRAPE UTILITY XL STRL (DRAPES) ×3 IMPLANT
DRSG TEGADERM 2-3/8X2-3/4 SM (GAUZE/BANDAGES/DRESSINGS) IMPLANT
DRSG TEGADERM 4X4.75 (GAUZE/BANDAGES/DRESSINGS) IMPLANT
ELECT COATED BLADE 2.86 ST (ELECTRODE) ×3 IMPLANT
ELECT REM PT RETURN 9FT ADLT (ELECTROSURGICAL) ×3
ELECTRODE REM PT RTRN 9FT ADLT (ELECTROSURGICAL) ×2 IMPLANT
GAUZE SPONGE 4X4 12PLY STRL LF (GAUZE/BANDAGES/DRESSINGS) IMPLANT
GLOVE BIOGEL PI IND STRL 8 (GLOVE) ×2 IMPLANT
GLOVE BIOGEL PI INDICATOR 8 (GLOVE) ×1
GLOVE ECLIPSE 8.0 STRL XLNG CF (GLOVE) ×3 IMPLANT
GOWN STRL REUS W/ TWL LRG LVL3 (GOWN DISPOSABLE) ×4 IMPLANT
GOWN STRL REUS W/TWL LRG LVL3 (GOWN DISPOSABLE) ×2
HEMOSTAT ARISTA ABSORB 3G PWDR (HEMOSTASIS) IMPLANT
HEMOSTAT SNOW SURGICEL 2X4 (HEMOSTASIS) IMPLANT
IV KIT MINILOC 20X1 SAFETY (NEEDLE) IMPLANT
KIT CVR 48X5XPRB PLUP LF (MISCELLANEOUS) ×2 IMPLANT
KIT MARKER MARGIN INK (KITS) ×3 IMPLANT
NDL SAFETY ECLIPSE 18X1.5 (NEEDLE) IMPLANT
NEEDLE HYPO 18GX1.5 SHARP (NEEDLE)
NEEDLE HYPO 22GX1.5 SAFETY (NEEDLE) IMPLANT
NEEDLE HYPO 25X1 1.5 SAFETY (NEEDLE) ×3 IMPLANT
NEEDLE SPNL 22GX3.5 QUINCKE BK (NEEDLE) IMPLANT
NS IRRIG 1000ML POUR BTL (IV SOLUTION) ×3 IMPLANT
PACK BASIN DAY SURGERY FS (CUSTOM PROCEDURE TRAY) ×3 IMPLANT
PENCIL BUTTON HOLSTER BLD 10FT (ELECTRODE) ×3 IMPLANT
SET SHEATH INTRODUCER 10FR (MISCELLANEOUS) IMPLANT
SHEATH COOK PEEL AWAY SET 9F (SHEATH) IMPLANT
SLEEVE SCD COMPRESS KNEE MED (MISCELLANEOUS) ×3 IMPLANT
SPONGE LAP 4X18 RFD (DISPOSABLE) ×3 IMPLANT
STRIP CLOSURE SKIN 1/2X4 (GAUZE/BANDAGES/DRESSINGS) IMPLANT
SUT MNCRL AB 4-0 PS2 18 (SUTURE) ×3 IMPLANT
SUT MON AB 4-0 PC3 18 (SUTURE) ×3 IMPLANT
SUT PROLENE 2 0 CT2 30 (SUTURE) IMPLANT
SUT PROLENE 2 0 SH DA (SUTURE) ×3 IMPLANT
SUT SILK 2 0 TIES 17X18 (SUTURE)
SUT SILK 2-0 18XBRD TIE BLK (SUTURE) IMPLANT
SUT VICRYL 3-0 CR8 SH (SUTURE) ×3 IMPLANT
SYR 5ML LUER SLIP (SYRINGE) ×3 IMPLANT
SYR CONTROL 10ML LL (SYRINGE) ×3 IMPLANT
TOWEL GREEN STERILE FF (TOWEL DISPOSABLE) ×6 IMPLANT
TRAY FAXITRON CT DISP (TRAY / TRAY PROCEDURE) ×3 IMPLANT
TUBE CONNECTING 20X1/4 (TUBING) ×3 IMPLANT
YANKAUER SUCT BULB TIP NO VENT (SUCTIONS) ×3 IMPLANT

## 2019-04-04 NOTE — Op Note (Signed)
Preoperative diagnosis: Stage I left breast cancer  Postoperative diagnosis: Same  Procedure: Left breast seed localized lumpectomy with left axillary sentinel lymph node mapping of deep left axillary sentinel node with placement of right jugular 8 [place] Port-A-Cath with C arm and ultrasound guidance  Surgeon: Erroll Luna, MD  Anesthesia: LMA with left pectoral block and local consisting of 0.25% Sensorcaine with epinephrine  EBL: 10 cc  Specimen: Left breast tissue with seed and clip verified by Faxitron with additional medial margin and 1 left axillary sentinel node hot  Drains: None  IV fluids: Per anesthesia record  Indications for procedure: Patient presents for left breast lumpectomy secondary to stage I breast cancer.  She will need postoperative chemotherapy therefore Port-A-Cath be placed as well.  She was seen by the multidisciplinary breast cancer team and opted for breast conservation after reviewing all of her options in meeting with specialist.The procedure has been discussed with the patient. Alternatives to surgery have been discussed with the patient.  Risks of surgery include bleeding,  Infection,  Seroma formation, death,  and the need for further surgery.   Sentinel lymph node mapping and dissection has been discussed with the patient.  Risk of bleeding,  Infection,  Seroma formation,  Additional procedures,,  Shoulder weakness ,  Shoulder stiffness,  Nerve and blood vessel injury and reaction to the mapping dyes have been discussed.  Alternatives to surgery have been discussed with the patient.  The patient agrees to proceed.  Risk of port placement include but not exclusive of bleeding, infection, major blood vessel injury, death, DVT, cardiac injury, mediastinal injury, catheter migration, catheter embolization, clot formation, catheter malfunction requiring replacement.  Description of procedure: The patient was met in the holding area and questions were answered.   Neoprobe used to verify seed location.  She underwent injection of technetium sulfur colloid by radiology technician and placement of a pectoral block per anesthesia.  All questions were answered.  She was taken back to the operative room.  She is placed supine upon the operating table.  After induction of general esthesia the right neck and right upper chest regions were prepped and draped in sterile fashion.  Timeout was performed.  The Port-A-Cath was placed first.  Under ultrasound guidance with the patient in Trendelenburg the right internal jugular vein was identified and a needle was placed directly in this under ultrasound guidance.  A return of dark nonpulsatile blood was noted and a wire was fed through this.  This was done with the assistance of C arm and appeared to go down the superior vena cava to the right atrium.  The patient was flattened out.  Local anesthetic was infiltrated just below the right clavicle region.  Incision was made and a small pocket was created.  An 8 [place] port was used it was attached and flushed.  It was tunneled from the lower incision to the upper incision.  It was cut to 20 cm.  With the patient patient Trendelenburg the dilator introducer complex was placed over the wire moving the wire to and fro without resistance.  We then removed the dilator and wire leaving the peel-away sheath in place.  The catheter was placed down the peel-away sheath and this was peeled away without difficulty.  C-arm was used to verify tip location in the distal superior vena cava without any complicating features.  It drew back easily and was flushed with heparinized saline.  Heparinized saline concentrate 5 cc was placed into the port.  It was secured to the chest wall with 2-0 Prolene.  We then closed incision with 3-0 Vicryl and 4-0 Monocryl.  Dermabond applied to both.  A new prep was done in the left breast.  This was done under sterile conditions.  Timeout was performed again.   Neoprobe used to identify the seed in the left medial breast.  Curvilinear incision was made along the medial border of the nipple areolar complex.  Local anesthetic was infiltrated as well.  All tissue around the mass was excised.  The medial margin appeared close and this was taken as an additional margin.  Faxitron revealed both the seed and clip to be in the specimen.  Clips were used to mark the cavity and the cavity was made hemostatic.  This was then closed with 3-0 Vicryl and 4-0 Monocryl.  Neoprobe settings were switched to technetium.  Hot spot identified in left axilla and a 3 cm incision was made along the inferior border of the axilla.  Dissection was carried up into the level 1 deep lymph node basin.  1 hot node was identified and removed.  Background counts fell below threshold and showed no increase activity or uptake.  The long thoracic nerve, thoracodorsal trunk and axillary vein were all preserved.  Hemostasis was achieved with cautery.  The wound was closed with a deep layer 3-0 Vicryl and then 4-0 Monocryl.  Dermabond was applied to all incisions.  All counts were found to be correct.  Breast binder was in place.  The patient was then awoke extubated taken to recovery in satisfactory condition.

## 2019-04-04 NOTE — Transfer of Care (Signed)
Immediate Anesthesia Transfer of Care Note  Patient: Chelsea Hobbs  Procedure(s) Performed: RADIOCATIVE SEED GUIDED LEFT BREAST LUMPECTOMY, LEFT AXILLARY SENTINEL LYMPH NODE BIOPSY (Left Breast) INSERTION PORT-A-CATH WITH ULTRASOUND (N/A Chest)  Patient Location: PACU  Anesthesia Type:General and Regional  Level of Consciousness: awake, alert  and oriented  Airway & Oxygen Therapy: Patient Spontanous Breathing and Patient connected to face mask oxygen  Post-op Assessment: Report given to RN and Post -op Vital signs reviewed and stable  Post vital signs: Reviewed and stable  Last Vitals:  Vitals Value Taken Time  BP    Temp    Pulse    Resp    SpO2      Last Pain:  Vitals:   04/04/19 1400  TempSrc:   PainSc: 0-No pain      Patients Stated Pain Goal: 3 ([id number])  Complications: No apparent anesthesia complications

## 2019-04-04 NOTE — Interval H&P Note (Signed)
History and Physical Interval Note:  04/04/2019 3:31 PM  Chelsea Hobbs  has presented today for surgery, with the diagnosis of LEFT BREAST CANCER.  The various methods of treatment have been discussed with the patient and family. After consideration of risks, benefits and other options for treatment, the patient has consented to  Procedure(s): LEFT BREAST LUMPECTOMY WITH RADIOACTIVE SEED AND LEFT SENTINEL LYMPH NODE MAPPING (Left) INSERTION PORT-A-CATH WITH ULTRASOUND (N/A) as a surgical intervention.  The patient's history has been reviewed, patient examined, no change in status, stable for surgery.  I have reviewed the patient's chart and labs.  Questions were answered to the patient's satisfaction.     [place]

## 2019-04-04 NOTE — Discharge Instructions (Signed)
[facility] °Office Phone Number [phone] ° °BREAST BIOPSY/ PARTIAL MASTECTOMY: POST OP INSTRUCTIONS ° °Always review your discharge instruction sheet given to you by the facility where your surgery was performed. ° °IF YOU HAVE DISABILITY OR FAMILY LEAVE FORMS, YOU MUST BRING THEM TO THE OFFICE FOR PROCESSING.  DO NOT GIVE THEM TO YOUR DOCTOR. ° °1. A prescription for pain medication may be given to you upon discharge.  Take your pain medication as prescribed, if needed.  If narcotic pain medicine is not needed, then you may take acetaminophen (Tylenol) or ibuprofen (Advil) as needed. °2. Take your usually prescribed medications unless otherwise directed °3. If you need a refill on your pain medication, please contact your pharmacy.  They will contact our office to request authorization.  Prescriptions will not be filled after 5pm or on week-ends. °4. You should eat very light the first 24 hours after surgery, such as soup, crackers, pudding, etc.  Resume your normal diet the day after surgery. °5. Most patients will experience some swelling and bruising in the breast.  Ice packs and a good support bra will help.  Swelling and bruising can take several days to resolve.  °6. It is common to experience some constipation if taking pain medication after surgery.  Increasing fluid intake and taking a stool softener will usually help or prevent this problem from occurring.  A mild laxative (Milk of Magnesia or Miralax) should be taken according to package directions if there are no bowel movements after 48 hours. °7. Unless discharge instructions indicate otherwise, you may remove your bandages 24-48 hours after surgery, and you may shower at that time.  You may have steri-strips (small skin tapes) in place directly over the incision.  These strips should be left on the skin for 7-10 days.  If your surgeon used skin glue on the incision, you may shower in 24 hours.  The glue will flake off over the  next 2-3 weeks.  Any sutures or staples will be removed at the office during your follow-up visit. °8. ACTIVITIES:  You may resume regular daily activities (gradually increasing) beginning the next day.  Wearing a good support bra or sports bra minimizes pain and swelling.  You may have sexual intercourse when it is comfortable. °a. You may drive when you no longer are taking prescription pain medication, you can comfortably wear a seatbelt, and you can safely maneuver your car and apply brakes. °b. RETURN TO WORK:  ______________________________________________________________________________________ °9. You should see your doctor in the office for a follow-up appointment approximately two weeks after your surgery.  Your doctor’s nurse will typically make your follow-up appointment when she calls you with your pathology report.  Expect your pathology report 2-3 business days after your surgery.  You may call to check if you do not hear from us after three days. °10. OTHER INSTRUCTIONS: _______________________________________________________________________________________________ _____________________________________________________________________________________________________________________________________ °_____________________________________________________________________________________________________________________________________ °_____________________________________________________________________________________________________________________________________ ° °WHEN TO CALL YOUR DOCTOR: °1. Fever over 101.0 °2. Nausea and/or vomiting. °3. Extreme swelling or bruising. °4. Continued bleeding from incision. °5. Increased pain, redness, or drainage from the incision. ° °The clinic staff is available to answer your questions during regular business hours.  Please don’t hesitate to call and ask to speak to one of the nurses for clinical concerns.  If you have a medical emergency, go to the nearest  emergency room or call 911.  A surgeon from [facility] is always on call at the hospital. ° °For further questions, please visit [email or web address]  ° ° ° ° ° ° °  PORT-A-CATH: POST OP INSTRUCTIONS  Always review your discharge instruction sheet given to you by the facility where your surgery was performed.   1. A prescription for pain medication may be given to you upon discharge. Take your pain medication as prescribed, if needed. If narcotic pain medicine is not needed, then you make take acetaminophen (Tylenol) or ibuprofen (Advil) as needed.  2. Take your usually prescribed medications unless otherwise directed. 3. If you need a refill on your pain medication, please contact our office. All narcotic pain medicine now requires a paper prescription.  Phoned in and fax refills are no longer allowed by law.  Prescriptions will not be filled after 5 pm or on weekends.  4. You should follow a light diet for the remainder of the day after your procedure. 5. Most patients will experience some mild swelling and/or bruising in the area of the incision. It may take several days to resolve. 6. It is common to experience some constipation if taking pain medication after surgery. Increasing fluid intake and taking a stool softener (such as Colace) will usually help or prevent this problem from occurring. A mild laxative (Milk of Magnesia or Miralax) should be taken according to package directions if there are no bowel movements after 48 hours.  7. Unless discharge instructions indicate otherwise, you may remove your bandages 48 hours after surgery, and you may shower at that time. You may have steri-strips (small white skin tapes) in place directly over the incision.  These strips should be left on the skin for 7-10 days.  If your surgeon used Dermabond (skin glue) on the incision, you may shower in 24 hours.  The glue will flake off over the next 2-3 weeks.  8. If your port is left accessed at  the end of surgery (needle left in port), the dressing cannot get wet and should only by changed by a healthcare professional. When the port is no longer accessed (when the needle has been removed), follow step 7.   9. ACTIVITIES:  Limit activity involving your arms for the next 72 hours. Do no strenuous exercise or activity for 1 week. You may drive when you are no longer taking prescription pain medication, you can comfortably wear a seatbelt, and you can maneuver your car. 10.You may need to see your doctor in the office for a follow-up appointment.  Please       check with your doctor.  11.When you receive a new Port-a-Cath, you will get a product guide and        ID card.  Please keep them in case you need them.  WHEN TO CALL YOUR DOCTOR [phone]): 1. Fever over 101.0 2. Chills 3. Continued bleeding from incision 4. Increased redness and tenderness at the site 5. Shortness of breath, difficulty breathing   The clinic staff is available to answer your questions during regular business hours. Please dont hesitate to call and ask to speak to one of the nurses or medical assistants for clinical concerns. If you have a medical emergency, go to the nearest emergency room or call 911.  A surgeon from [facility] is always on call at the hospital.     For further information, please visit [email or web address]   May take next dose of Tylenol at 8:00 PM on 04/04/2019 if needed. May take next dose of Ibuprofen at 10:00 PM on 04/04/2019 if needed.    Post Anesthesia Home Care Instructions  Activity: Get plenty of rest for the remainder  of the day. A responsible individual must stay with you for 24 hours following the procedure.  For the next 24 hours, DO NOT: -Drive a car -Paediatric nurse -Drink alcoholic beverages -Take any medication unless instructed by your physician -Make any legal decisions or sign important papers.  Meals: Start with liquid foods  such as gelatin or soup. Progress to regular foods as tolerated. Avoid greasy, spicy, heavy foods. If nausea and/or vomiting occur, drink only clear liquids until the nausea and/or vomiting subsides. Call your physician if vomiting continues.  Special Instructions/Symptoms: Your throat may feel dry or sore from the anesthesia or the breathing tube placed in your throat during surgery. If this causes discomfort, gargle with warm salt water. The discomfort should disappear within 24 hours.  If you had a scopolamine patch placed behind your ear for the management of post- operative nausea and/or vomiting:  1. The medication in the patch is effective for 72 hours, after which it should be removed.  Wrap patch in a tissue and discard in the trash. Wash hands thoroughly with soap and water. 2. You may remove the patch earlier than 72 hours if you experience unpleasant side effects which may include dry mouth, dizziness or visual disturbances. 3. Avoid touching the patch. Wash your hands with soap and water after contact with the patch.

## 2019-04-04 NOTE — Anesthesia Postprocedure Evaluation (Signed)
Anesthesia Post Note  Patient: Chelsea Hobbs  Procedure(s) Performed: RADIOCATIVE SEED GUIDED LEFT BREAST LUMPECTOMY, LEFT AXILLARY SENTINEL LYMPH NODE BIOPSY (Left Breast) INSERTION PORT-A-CATH WITH ULTRASOUND (N/A Chest)     Patient location during evaluation: PACU Anesthesia Type: General Level of consciousness: sedated Pain management: pain level controlled Vital Signs Assessment: post-procedure vital signs reviewed and stable Respiratory status: spontaneous breathing and respiratory function stable Cardiovascular status: stable Postop Assessment: no apparent nausea or vomiting Anesthetic complications: no    Last Vitals:  Vitals:   04/04/19 1730 04/04/19 1745  BP: (!) 132/93 114/73  Pulse: 92 85  Resp: (!) 23 13  Temp:    SpO2: 100% 99%    Last Pain:  Vitals:   04/04/19 1745  TempSrc:   PainSc: 3                  Cova Knieriem DANIEL

## 2019-04-04 NOTE — Anesthesia Procedure Notes (Signed)
Procedure Name: LMA Insertion Performed by: Scarlettrose Costilow M, CRNA Pre-anesthesia Checklist: Patient identified, Emergency Drugs available, Suction available, Patient being monitored and Timeout performed Patient Re-evaluated:Patient Re-evaluated prior to induction Oxygen Delivery Method: Circle system utilized Preoxygenation: Pre-oxygenation with 100% oxygen Induction Type: IV induction LMA: LMA inserted LMA Size: 3.0 Tube type: Oral Placement Confirmation: positive ETCO2,  CO2 detector and breath sounds checked- equal and bilateral Tube secured with: Tape Dental Injury: Teeth and Oropharynx as per pre-operative assessment        

## 2019-04-04 NOTE — Anesthesia Preprocedure Evaluation (Addendum)
Anesthesia Evaluation  Patient identified by MRN, date of birth, ID band Patient awake    Reviewed: Allergy & Precautions, NPO status , Patient's Chart, lab work & pertinent test results  Airway Mallampati: II  TM Distance: >3 FB Neck ROM: Full    Dental no notable dental hx. (+) Teeth Intact   Pulmonary asthma ,    Pulmonary exam normal breath sounds clear to auscultation       Cardiovascular negative cardio ROS Normal cardiovascular exam Rhythm:Regular Rate:Normal     Neuro/Psych Anxiety negative neurological ROS     GI/Hepatic negative GI ROS, Neg liver ROS,   Endo/Other  Left Breast Ca  Renal/GU negative Renal ROS  negative genitourinary   Musculoskeletal negative musculoskeletal ROS (+)   Abdominal   Peds  Hematology negative hematology ROS (+)   Anesthesia Other Findings   Reproductive/Obstetrics                             Anesthesia Physical Anesthesia Plan  ASA: II  Anesthesia Plan: General   Post-op Pain Management:  Regional for Post-op pain   Induction: Intravenous  PONV Risk Score and Plan: 4 or greater and Ondansetron, Dexamethasone, Treatment may vary due to age or medical condition and Midazolam  Airway Management Planned: LMA  Additional Equipment:   Intra-op Plan:   Post-operative Plan: Extubation in OR  Informed Consent: I have reviewed the patients History and Physical, chart, labs and discussed the procedure including the risks, benefits and alternatives for the proposed anesthesia with the patient or authorized representative who has indicated his/her understanding and acceptance.     Dental advisory given  Plan Discussed with: CRNA and Surgeon  Anesthesia Plan Comments:         Anesthesia Quick Evaluation

## 2019-04-04 NOTE — Progress Notes (Signed)
Assisted Eddie, nuc med tech, with nuc med injections. Side rails up, monitors on throughout procedure. See vital signs in flow sheet. Tolerated Procedure well. 

## 2019-04-04 NOTE — Anesthesia Procedure Notes (Signed)
Anesthesia Regional Block: Pectoralis block   Pre-Anesthetic Checklist: ,, timeout performed, Correct Patient, Correct Site, Correct Laterality, Correct Procedure, Correct Position, site marked, Risks and benefits discussed,  Surgical consent,  Pre-op evaluation,  At surgeon's request and post-op pain management  Laterality: Left  Prep: chloraprep       Needles:  Injection technique: Single-shot  Needle Type: Echogenic Stimulator Needle     Needle Length: 9cm  Needle Gauge: 21   Needle insertion depth: 7 cm   Additional Needles:   Procedures:,,,, ultrasound used (permanent image in chart),,,,  Narrative:  Start time: 04/04/2019 2:58 PM End time: 04/04/2019 3:03 PM Injection made incrementally with aspirations every 5 mL.  Performed by: Personally  Anesthesiologist: Josephine Igo, MD  Additional Notes: Timeout performed. Patient sedated. Relevant anatomy ID'd using [place]. Incremental 2-54ml injection of LA with frequent aspiration. Patient tolerated procedure well.        Left Pectoralis Block

## 2019-04-04 NOTE — Progress Notes (Signed)
Assisted Dr. Royce Macadamia with left, ultrasound guided, pectoralis block. Side rails up, monitors on throughout procedure. See vital signs in flow sheet. Tolerated Procedure well.

## 2019-04-05 ENCOUNTER — Other Ambulatory Visit: Payer: Self-pay | Admitting: Hematology

## 2019-04-05 ENCOUNTER — Encounter (HOSPITAL_BASED_OUTPATIENT_CLINIC_OR_DEPARTMENT_OTHER): Payer: Self-pay | Admitting: Surgery

## 2019-04-05 DIAGNOSIS — R9389 Abnormal findings on diagnostic imaging of other specified body structures: Secondary | ICD-10-CM

## 2019-04-05 NOTE — Addendum Note (Signed)
Addendum  created 04/05/19 1042 by Tawni Millers, CRNA   Charge Capture section accepted

## 2019-04-06 LAB — SURGICAL PATHOLOGY

## 2019-04-11 ENCOUNTER — Other Ambulatory Visit: Payer: Self-pay

## 2019-04-11 ENCOUNTER — Ambulatory Visit
Admission: RE | Admit: 2019-04-11 | Discharge: 2019-04-11 | Disposition: A | Discharge status: Home / Self Care | Payer: BC Managed Care – PPO | Source: Ambulatory Visit | Attending: Hematology | Admitting: Hematology

## 2019-04-11 ENCOUNTER — Encounter: Payer: Self-pay | Admitting: *Deleted

## 2019-04-11 ENCOUNTER — Other Ambulatory Visit (HOSPITAL_COMMUNITY): Payer: Self-pay | Admitting: Diagnostic Radiology

## 2019-04-11 DIAGNOSIS — R9389 Abnormal findings on diagnostic imaging of other specified body structures: Secondary | ICD-10-CM

## 2019-04-11 DIAGNOSIS — D241 Benign neoplasm of right breast: Secondary | ICD-10-CM | POA: Diagnosis not present

## 2019-04-11 DIAGNOSIS — N6341 Unspecified lump in right breast, subareolar: Secondary | ICD-10-CM | POA: Diagnosis not present

## 2019-04-11 DIAGNOSIS — N6312 Unspecified lump in the right breast, upper inner quadrant: Secondary | ICD-10-CM | POA: Diagnosis not present

## 2019-04-12 ENCOUNTER — Telehealth: Payer: Self-pay

## 2019-04-12 NOTE — Telephone Encounter (Signed)
Nutrition Assessment  Reason for Assessment:  Pt attended Breast Clinic on 9/9 and was given nutrition packet from nurse navigator.  ASSESSMENT:  48 year old female with left breast cancer.  Patient s/p lumpectomy with sentinel node biopsy.  Planning chemotherapy, radiation therapy and antiestrogens.    Spoke with patient this am via phone and introduced self and service at the cancer center.  Patient reports that she has reviewed nutrition packet and has been trying to increase fruits and vegetables in her diet and get good sources of protein.    Medications:  MVI, Vit C, omega 3 fatty acids, Vit D  Labs: reviewed from 9/9  Anthropometrics:   Height: 62 inches Weight: 129 lb BMI: 23   NUTRITION DIAGNOSIS: Food and nutrition related knowledge deficit related to new diagnosis of breast cancer as evidenced by no prior need for nutrition related information.  INTERVENTION:   Discussed briefly packet of information regarding nutritional tips for breast cancer patients.  Questions answered.  Contact information provided and patient knows to contact me with questions/concerns.    MONITORING, EVALUATION, and GOAL: Pt will consume a healthy plant based diet to maintain lean body mass throughout treatment.   Chelsea Hobbs B. Zenia Resides, [place], [place] Registered Dietitian [phone] (pager)

## 2019-04-17 ENCOUNTER — Telehealth: Payer: Self-pay | Admitting: *Deleted

## 2019-04-17 NOTE — Telephone Encounter (Signed)
Pt called with c/o drainage from LN incision. Pt also called [facility] to discuss with Dr. Brantley Stage. Return pt call, was informed by pt that she has spoke to Dr. Josetta Huddle office and given instructions as well as abx to prevent infection. Denies further needs or questions at this time.

## 2019-04-19 NOTE — Progress Notes (Signed)
[place]   [phone] [phone]   Clinic Follow up Note   Patient Care Team: Wenda Low, MD as PCP - General (Internal Medicine) Rockwell Germany, RN as Oncology Nurse Navigator Mauro Kaufmann, RN as Oncology Nurse Navigator Erroll Luna, MD as Consulting Physician (General Surgery) Truitt Merle, MD as Consulting Physician (Hematology) Gery Pray, MD as Consulting Physician (Radiation Oncology)  Date of Service:  04/21/2019  CHIEF COMPLAINT: F/u of left breast cancer   SUMMARY OF ONCOLOGIC HISTORY: Oncology History Overview Note  Cancer Staging Malignant neoplasm of upper-inner quadrant of left breast in female, estrogen receptor positive ([place]) Staging form: Breast, AJCC 8th Edition - Clinical stage from 03/22/2019: Stage IA (cT1c, cN0, cM0, G3, ER+, PR+, HER2+) - Signed by Truitt Merle, MD on 03/22/2019    Malignant neoplasm of upper-inner quadrant of left breast in female, estrogen receptor positive ([place])  03/10/2019 Mammogram   Diagnostic Mammogram 03/10/19  IMPRESSION: 1. Suspicious mass in the 10 o'clock position of the left breast, 3cm from the nipple measuring 12x7x65m. Biopsy is indicated. 2. Indeterminate mass in the 5:30 o'clock position of the left breast, 5cm from the nipple, measuring 9x6x746m Although this may reflect a fibroadenoma, tissue sampling is recommended.   03/16/2019 Initial Biopsy   Diagnosis 03/16/19  1. 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 SEE NOTE 2. Breast, left, needle core biopsy, 5:30 o'clock - FIBROADENOMA   03/16/2019 Receptors her2   Results: IMMUNOHISTOCHEMICAL AND MORPHOMETRIC ANALYSIS PERFORMED MANUALLY The tumor cells are POSITIVE for Her2 (3+). Estrogen Receptor: 100%, POSITIVE, STRONG STAINING INTENSITY Progesterone Receptor: 100%, POSITIVE, STRONG STAINING INTENSITY Proliferation Marker Ki67: 60%   03/21/2019 Initial Diagnosis   Malignant neoplasm  of upper-inner quadrant of left breast in female, estrogen receptor positive [place]  03/22/2019 Cancer Staging   Staging form: Breast, AJCC 8th Edition - Clinical stage from 03/22/2019: Stage IA (cT1c, cN0, cM0, G3, ER+, PR+, HER2+) - Signed by FeTruitt MerleMD on 03/22/2019   03/31/2019 Genetic Testing   VUS in CTNNA1 called c.[id number]>T (Intronic) was identified on the [facility] Common Hereditary Cancers Panel. The Common Hereditary Cancers Panel offered by [facility] includes sequencing and/or deletion duplication testing of the following 47 genes: APC, ATM, AXIN2, BARD1, BMPR1A, BRCA1, BRCA2, BRIP1, CDH1, CDKN2A (p14ARF), CDKN2A (p16INK4a), CKD4, CHEK2, CTNNA1, DICER1, EPCAM (Deletion/duplication testing only), GREM1 (promoter region deletion/duplication testing only), KIT, MEN1, MLH1, MSH2, MSH3, MSH6, MUTYH, NBN, NF1, NHTL1, PALB2, PDGFRA, PMS2, POLD1, POLE, PTEN, RAD50, RAD51C, RAD51D, SDHB, SDHC, SDHD, SMAD4, SMARCA4. STK11, TP53, TSC1, TSC2, and VHL.  The following genes were evaluated for sequence changes only: SDHA and HOXB13 c.251G>A variant only. The report date is 03/30/2019.    03/31/2019 Breast MRI   MRI breast 03/31/19  IMPRESSION: 1. 5 millimeter enhancing oval mass in the anteromedial aspect of the RIGHT breast warranting further evaluation. Given its anterior depth and visibility on noncontrast images, ultrasound is recommended to determine if there is a sonographic correlate. If no sonographic correlate is identified to guide biopsy, MRI biopsy of the RIGHT breast is recommended. 2. Known malignancy in the UPPER INNER QUADRANT of the LEFT breast measuring 1.0 centimeters. 3. 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   04/04/2019 Surgery   RADIOCATIVE SEED GUIDED LEFT BREAST LUMPECTOMY, LEFT AXILLARY SENTINEL LYMPH NODE BIOPSY and PAC placement  By Dr. CoBrantley Stage9/22/20    04/04/2019 Pathology Results   DIAGNOSIS: 04/04/19  A.  BREAST, LEFT, LUMPECTOMY:   -  Invasive ductal carcinoma, Nottingham grade 3 of 3, 1.3 cm  -  Ductal carcinoma in-situ, intermediate grade  -  Margins uninvolved by carcinoma (0.1 cm; posterior margin; see  comment)  -  Previous biopsy site changes present  -  See oncology table and comment below   B. BREAST, LEFT, ADDITIONAL MEDIAL MARGIN, EXCISION:  -  Fibrocystic changes  -  No residual carcinoma identified   C. LYMPH NODE, LEFT AXILLARY, SENTINEL, BIOPSY:  -   No carcinoma identified in one lymph node (0/1)    04/11/2019 Pathology Results   Diagnosis Breast, right, needle core biopsy, 2 o'clock - SMALL INTRADUCTAL PAPILLOMA. SEE NOTE - SMALL FIBROADENOMA - NEGATIVE FOR CARCINOMA      CURRENT THERAPY:  PENDING adjuvant Weekly Taxol and Herceptin q3weeks for 12 weeks.   INTERVAL HISTORY:  Chelsea Hobbs is here for a follow up. She notes she is concerned with her left axillary incision. She feels there is pus and infection there. She notes first week after surgery she was sore again and then got red. She called her surgeon about it. The PA saw her and notes her incision was fine. She notes the weekend afterward her incision drained and the surgeon started her on 10 day dose of doxycycline. She did not see him. She plans to see him in 10 days. She denies having had fever or feeling incision warm. She notes her left breast was tender 11.5 weeks after surgery.  She notes the day of left breast surgery she was shown MRI which showed mass of right breast. She proceeded with left breast surgery and had right breast biopsy the following week. She plans to have right breast benign mass removed soon. She notes she had mitral valve prolapse during pregnancy 20 years ago. She has not seen that cardiologist since then. She notes plans to still work during chemo and have chemo on Fridays.  She notes her concern for aggressive cancer and wonders can she have b/l mastectomy in the future.  She is on multiple  supplements and vitamin which she is fine to continue.    REVIEW OF SYSTEMS:   Constitutional: Denies fevers, chills or abnormal weight loss Eyes: Denies blurriness of vision Ears, nose, mouth, throat, and face: Denies mucositis or sore throat Respiratory: Denies cough, dyspnea or wheezes Cardiovascular: Denies palpitation, chest discomfort or lower extremity swelling Gastrointestinal:  Denies nausea, heartburn or change in bowel habits Skin: Denies abnormal skin rashes Lymphatics: Denies new lymphadenopathy or easy bruising Neurological:Denies numbness, tingling or new weaknesses Behavioral/Psych: Mood is stable, no new changes  BREAST: (+) Left breast and axillary soreness, prior incision drainage.  All other systems were reviewed with the patient and are negative.  MEDICAL HISTORY:  Past Medical History:  Diagnosis Date  . Asthma   . Family history of breast cancer   . Family history of lung cancer     SURGICAL HISTORY: Past Surgical History:  Procedure Laterality Date  . BREAST BIOPSY Right   . BREAST LUMPECTOMY WITH RADIOACTIVE SEED AND SENTINEL LYMPH NODE BIOPSY Left 04/04/2019   Procedure: RADIOCATIVE SEED GUIDED LEFT BREAST LUMPECTOMY, LEFT AXILLARY SENTINEL LYMPH NODE BIOPSY;  Surgeon: Erroll Luna, MD;  Location: [facility];  Service: General;  Laterality: Left;  . NASAL SINUS SURGERY    . PORTACATH PLACEMENT N/A 04/04/2019   Procedure: INSERTION PORT-A-CATH WITH ULTRASOUND;  Surgeon: Erroll Luna, MD;  Location: [facility];  Service:  General;  Laterality: N/A;    I have reviewed the social history and family history with the patient and they are unchanged from previous note.  ALLERGIES:  is allergic to doxycycline and latex.  MEDICATIONS:  Current Outpatient Medications  Medication Sig Dispense Refill  . albuterol (PROVENTIL HFA;VENTOLIN HFA) 108 (90 BASE) MCG/ACT inhaler Inhale 2 puffs into the lungs every 6 (six) hours as  needed. For shortness of breath    . Ascorbic Acid (VITAMIN C) 1000 MG tablet Take 1,000 mg by mouth daily.    . B Complex Vitamins (VITAMIN-B COMPLEX) TABS Take 1 tablet by mouth daily.    . Calcium Carbonate-Vit D-Min (CALCIUM 1200 PO) Take 1 tablet by mouth daily.    . cholecalciferol (VITAMIN D3) 25 MCG (1000 UT) tablet Take 2,000 Units by mouth daily.     Marland Kitchen doxycycline (VIBRA-TABS) 100 MG tablet Take 100 mg by mouth daily.    . fexofenadine (ALLEGRA) 60 MG tablet Take 60 mg by mouth 2 (two) times daily.    Marland Kitchen ibuprofen (ADVIL) 800 MG tablet Take 1 tablet (800 mg total) by mouth every 8 (eight) hours as needed. 30 tablet 0  . ibuprofen (ADVIL,MOTRIN) 200 MG tablet Take 400 mg by mouth every 6 (six) hours as needed. For headaches    . Multiple Vitamin (MULTIVITAMIN WITH MINERALS) TABS Take 1 tablet by mouth daily.    . Omega-3 Fatty Acids (FISH OIL PO) Take 1 tablet by mouth daily.    . Zinc 25 MG TABS Take by mouth.    . ALPRAZolam (XANAX) 1 MG tablet Take 0.5 mg by mouth 2 (two) times daily as needed.    Marland Kitchen oxyCODONE (OXY IR/ROXICODONE) 5 MG immediate release tablet Take 1 tablet (5 mg total) by mouth every 6 (six) hours as needed for severe pain. (Patient not taking: Reported on 04/21/2019) 15 tablet 0  . SUMAtriptan (IMITREX) 100 MG tablet Take 100 mg by mouth.    . venlafaxine XR (EFFEXOR-XR) 37.5 MG 24 hr capsule TAKE 1 CAPSULE BY MOUTH EVERY DAY WITH FOOD     No current facility-administered medications for this visit.     PHYSICAL EXAMINATION: ECOG PERFORMANCE STATUS: 1 - Symptomatic but completely ambulatory  Vitals:   04/21/19 1608  BP: 127/85  Pulse: 82  Resp: 18  Temp: 98.5 F (36.9 C)  SpO2: 100%   Filed Weights   04/21/19 1608  Weight: 126 lb (57.2 kg)    GENERAL:alert, no distress and comfortable SKIN: skin color, texture, turgor are normal, no rashes or significant lesions EYES: normal, Conjunctiva are pink and non-injected, sclera clear  NECK: supple, thyroid  normal size, non-tender, without nodularity LYMPH:  no palpable lymphadenopathy in the cervical, axillary  LUNGS: clear to auscultation and percussion with normal breathing effort HEART: regular rate & rhythm and no murmurs and no lower extremity edema ABDOMEN:abdomen soft, non-tender and normal bowel sounds Musculoskeletal:no cyanosis of digits and no clubbing  NEURO: alert & oriented x 3 with fluent speech, no focal motor/sensory deficits BREAST: S/p left lumpectomy: Surgical incision of breast has healed well, the incision in left axilla still healing with 2.5cm opening wound, no drainage, as pictured below. No palpable mass, nodules or adenopathy bilaterally. Breast exam benign.        LABORATORY DATA:  I have reviewed the data as listed CBC Latest Ref Rng & Units 03/22/2019 07/29/2013 05/09/2012  WBC 4.0 - 10.5 K/uL 7.4 12.5(H) 13.2(H)  Hemoglobin 12.0 - 15.0 g/dL 13.4 12.9 12.5  Hematocrit 36.0 - 46.0 % 39.3 36.6 35.5(L)  Platelets 150 - 400 K/uL 343 396 322     CMP Latest Ref Rng & Units 03/22/2019 07/29/2013 05/09/2012  Glucose 70 - 99 mg/dL 101(H) 116(H) 168(H)  BUN 6 - 20 mg/dL '10 10 18  ' Creatinine 0.44 - 1.00 mg/dL 0.78 0.64 0.69  Sodium 135 - 145 mmol/L 142 133(L) 138  Potassium 3.5 - 5.1 mmol/L 3.3(L) 3.9 3.5  Chloride 98 - 111 mmol/L 105 95(L) 102  CO2 22 - 32 mmol/L '26 24 26  ' Calcium 8.9 - 10.3 mg/dL 9.6 8.9 9.1  Total Protein 6.5 - 8.1 g/dL 7.9 6.8 -  Total Bilirubin 0.3 - 1.2 mg/dL 0.6 0.6 -  Alkaline Phos 38 - 126 U/L 52 52 -  AST 15 - 41 U/L 18 18 -  ALT 0 - 44 U/L 15 25 -      RADIOGRAPHIC STUDIES: I have personally reviewed the radiological images as listed and agreed with the findings in the report. No results found.   ASSESSMENT & PLAN:  Chelsea Hobbs is a 48 y.o. female with   1 Malignant neoplasm of upper-inner quadrant of left breast, Stage IA, p(T1cN0M0), ER+/PR+, HER2+, Grade III -She was diagnosed in 03/2019. She underwent left breast  lumpectomy and SLNB on 04/04/19.  -We discussed her pathology results which showed invasive and DCIS components, 1.3cm, negative but DCIS margins were close (71m), node negative. We discussed that DCIS with wide margin(>=250m predicts lower risk of local recurrence. Given her young age and high grade disease, she may need re-excision, which probably can be done with her right breast surgery in future. I will discuss with Drs. Cornett and Kinard her option of re-excisional surgery vs adjuvant radiation in tumor board.  -Her breast MRI from 03/31/19 showed known left breast cancer and 1.72m22mnhancing right breast mass. Her right breast biopsy from 04/11/19 showed small intraductal papilloma which is benign. Dr. CorBrantley Stagecommended lumpectomy to remove this in the future.  -Her left breast incision still healing, has 2.5cm opening wound at incision on exam today. She is on 10 day dose doxycycline due to recent wound infection. I discussed healing can take time. I discussed waiting until her incision is better healed before proceeding with recommended adjuvant treatment to reduce her risk of recurrence.  -Given her 1.3cm tumor, node negative disease I recommend weekly Taxol or Abraxane and anti-Her2 Herceptin q3weeks for 12 weeks. This will be followed by Herceptin maintenance q3weeks to complete 1 year of treatment. --Chemotherapy consent: Side effects including but does not limited to, fatigue, nausea, vomiting, diarrhea, hair loss, neuropathy, fluid retention, renal and kidney dysfunction, neutropenic fever, needed for blood transfusion, bleeding, skin rash, reversible cardiomyopathy, etc, were discussed with patient in great detail. She agrees to proceed. -the goal of chemo is curative  -She may look into grant for Dignicap and is willing to use ice bags during chemo to reduce her risk of neuropathy.  -She may be eligible for neuropathy observational study, she is interested.  -Will monitor her heart function  with periodically with ECHO while on Herceptin.  -after chemo, she will still proceed with adjuvant Radiation and antiestrogen therapy after chemo.  -For surveillance she has concerns that Mammogram screenings are not enough. I discussed the option of adding MRIs/Fast MRI yearly for additional screening in between her mammograms. She is interested. I discussed in her case, medically, b/l mastectomies may not be needed, but personally personal is understate. She will continue  to think about it and discuss this with Dr. Brantley Stage.  -Plan to start chemo on 10/30. She plans to work during chemo and would like to keep infusions on Fridays.    2. Bone Health  -Per pt when she had her IUD removed, her labs show she is nearly menopausal and her recent DEXA shows she is slightly osteoporotic in right hip. I will request labs and DEXA from her Gyn.   3. Genetic Testing was negative for pathogenetic nutations   4. Anxiety  -Previously on Xanax and Effexor -Managed by her PCP -She does feels stressed about finances and her overall treatment plan. -She would like to be able to work through this treatment.  -I discussed our financial office, social work office for counseling and advocates who are available to her. She is interested in speaking with SW as needed.    PLAN:  -Lab, flush, and Weekly Taxol and Herceptin every 3 weeks for total of 12 weeks starting 05/12/19 -f/u with me or APP every 2 weeks X6 starting with second week treatment.  -Request DEXA and labs from her Gyn   No problem-specific Assessment & Plan notes found for this encounter.   No orders of the defined types were placed in this encounter.  All questions were answered. The patient knows to call the clinic with any problems, questions or concerns. No barriers to learning was detected. I spent 30 minutes counseling the patient face to face. The total time spent in the appointment was 40 minutes and more than 50% was on  counseling and review of test results     Truitt Merle, MD 04/21/2019   I, Joslyn Devon, am acting as scribe for Truitt Merle, MD.   I have reviewed the above documentation for accuracy and completeness, and I agree with the above.

## 2019-04-21 ENCOUNTER — Encounter: Payer: Self-pay | Admitting: Hematology

## 2019-04-21 ENCOUNTER — Other Ambulatory Visit: Payer: Self-pay | Admitting: Hematology

## 2019-04-21 ENCOUNTER — Other Ambulatory Visit: Payer: Self-pay

## 2019-04-21 ENCOUNTER — Telehealth: Payer: Self-pay | Admitting: *Deleted

## 2019-04-21 ENCOUNTER — Inpatient Hospital Stay: Payer: BC Managed Care – PPO | Attending: Hematology | Admitting: Hematology

## 2019-04-21 VITALS — BP 127/85 | HR 82 | Temp 98.5°F | Resp 18 | Ht 62.0 in | Wt 126.0 lb

## 2019-04-21 DIAGNOSIS — Z79899 Other long term (current) drug therapy: Secondary | ICD-10-CM | POA: Insufficient documentation

## 2019-04-21 DIAGNOSIS — Z170 Estrogen receptor positive status [ER+]: Secondary | ICD-10-CM

## 2019-04-21 DIAGNOSIS — F419 Anxiety disorder, unspecified: Secondary | ICD-10-CM | POA: Insufficient documentation

## 2019-04-21 DIAGNOSIS — C50212 Malignant neoplasm of upper-inner quadrant of left female breast: Secondary | ICD-10-CM | POA: Diagnosis not present

## 2019-04-21 DIAGNOSIS — Z5111 Encounter for antineoplastic chemotherapy: Secondary | ICD-10-CM | POA: Insufficient documentation

## 2019-04-21 DIAGNOSIS — D241 Benign neoplasm of right breast: Secondary | ICD-10-CM

## 2019-04-21 NOTE — Telephone Encounter (Signed)
Received call from patient concerning the area near her LN incision that is still draining.  She sent Dr. Brantley Stage a picture and she was started on doxycycline. She states it is getting better but is still draining. She is seeing Dr. Burr Medico this afternoon and wanted to make sure that she could look at the incision while she was there.  Informed her that I would send Dr. Burr Medico a message as well but I'm sure she would be happy to look at it.

## 2019-04-24 ENCOUNTER — Telehealth: Payer: Self-pay | Admitting: Hematology

## 2019-04-24 DIAGNOSIS — L578 Other skin changes due to chronic exposure to nonionizing radiation: Secondary | ICD-10-CM | POA: Diagnosis not present

## 2019-04-24 DIAGNOSIS — L570 Actinic keratosis: Secondary | ICD-10-CM | POA: Diagnosis not present

## 2019-04-24 NOTE — Telephone Encounter (Signed)
Scheduled appt per 10/9 los.  Spoke with patient and she is aware of the appt date and time.

## 2019-04-24 NOTE — Progress Notes (Signed)
[facility] Psychosocial Distress Screening Counseling Intern Counseling Intern was referred by distress screening protocol.  The patient scored a 8 on the Psychosocial Distress Thermometer which indicates severe distress. Counseling intern contacted patient by phone to assess for distress and other psychosocial needs.   Clinical Social Worker follow up needed: No.  ONCBCN DISTRESS SCREENING 04/24/2019  Distress experienced in past week (1-10) 8  Emotional problem type Nervousness/Anxiety;Adjusting to illness  Information Concerns Type Lack of info about treatment  Referral to support programs Yes   Chelsea Hobbs stated her distress level is significantly lower now from when she first completed her distress screen at [facility]. She stated she is nervous about starting chemotherapy, but declined one-on-one counseling services at this time. The patient stated she may be interested in counseling later in the month or early in November once her Chemotherapy begins.  I will check-in with the patient in 3 weeks to offer emotional and mental health support at that time.    Art Buff [place] Counseling Intern Voicemail:  [phone]

## 2019-04-28 ENCOUNTER — Other Ambulatory Visit: Payer: Self-pay | Admitting: Hematology

## 2019-04-28 ENCOUNTER — Other Ambulatory Visit: Payer: Self-pay

## 2019-04-28 ENCOUNTER — Inpatient Hospital Stay: Payer: BC Managed Care – PPO

## 2019-04-28 DIAGNOSIS — C50212 Malignant neoplasm of upper-inner quadrant of left female breast: Secondary | ICD-10-CM

## 2019-04-28 DIAGNOSIS — Z170 Estrogen receptor positive status [ER+]: Secondary | ICD-10-CM

## 2019-04-28 MED ORDER — PROCHLORPERAZINE MALEATE 10 MG PO TABS: 10.0000 mg | ORAL_TABLET | Freq: Four times a day (QID) | ORAL | 1 refills | Status: DC | PRN

## 2019-04-28 MED ORDER — ONDANSETRON HCL 8 MG PO TABS: 8.0000 mg | ORAL_TABLET | Freq: Two times a day (BID) | ORAL | 1 refills | Status: DC | PRN

## 2019-04-28 MED ORDER — LIDOCAINE-PRILOCAINE 2.5-2.5 % EX CREA: TOPICAL_CREAM | CUTANEOUS | 3 refills | Status: DC

## 2019-04-28 NOTE — Progress Notes (Signed)
START ON PATHWAY REGIMEN - Breast   Paclitaxel Weekly + Trastuzumab Weekly:   Administer weekly:     Paclitaxel      Trastuzumab-xxxx      Trastuzumab-xxxx   **Always confirm dose/schedule in your pharmacy ordering system**  Trastuzumab (Maintenance - NO Loading Dose):   A cycle is every 21 days:     Trastuzumab-xxxx   **Always confirm dose/schedule in your pharmacy ordering system**  Patient Characteristics: Postoperative without Neoadjuvant Therapy (Pathologic Staging), Invasive Disease, Adjuvant Therapy, HER2 Positive, ER Positive, Node Negative, pT1c, pN0/N1m Therapeutic Status: Postoperative without Neoadjuvant Therapy (Pathologic Staging) AJCC Grade: G3 AJCC N Category: pN0 AJCC M Category: cM0 ER Status: Positive (+) AJCC 8 Stage Grouping: IA HER2 Status: Positive (+) Oncotype Dx Recurrence Score: Not Appropriate AJCC T Category: pT1c PR Status: Positive (+) Intent of Therapy: Curative Intent, Discussed with Patient

## 2019-05-01 ENCOUNTER — Other Ambulatory Visit: Payer: Self-pay

## 2019-05-01 ENCOUNTER — Encounter: Payer: Self-pay | Admitting: Physical Therapy

## 2019-05-01 ENCOUNTER — Ambulatory Visit: Payer: BC Managed Care – PPO | Attending: Surgery | Admitting: Physical Therapy

## 2019-05-01 DIAGNOSIS — Z483 Aftercare following surgery for neoplasm: Secondary | ICD-10-CM | POA: Diagnosis not present

## 2019-05-01 DIAGNOSIS — Z170 Estrogen receptor positive status [ER+]: Secondary | ICD-10-CM | POA: Diagnosis not present

## 2019-05-01 DIAGNOSIS — C50212 Malignant neoplasm of upper-inner quadrant of left female breast: Secondary | ICD-10-CM | POA: Diagnosis not present

## 2019-05-01 DIAGNOSIS — G43709 Chronic migraine without aura, not intractable, without status migrainosus: Secondary | ICD-10-CM | POA: Diagnosis not present

## 2019-05-01 DIAGNOSIS — R293 Abnormal posture: Secondary | ICD-10-CM | POA: Diagnosis not present

## 2019-05-01 DIAGNOSIS — M25612 Stiffness of left shoulder, not elsewhere classified: Secondary | ICD-10-CM

## 2019-05-01 DIAGNOSIS — M25511 Pain in right shoulder: Secondary | ICD-10-CM | POA: Diagnosis not present

## 2019-05-01 DIAGNOSIS — M542 Cervicalgia: Secondary | ICD-10-CM | POA: Diagnosis not present

## 2019-05-01 NOTE — Therapy (Signed)
[place], [place], [address] Phone: [phone]   Fax:  [phone]  Physical Therapy Treatment  Patient Details  Name: Chelsea Hobbs Fornes MRN: [id number] Date of Birth: August 10, 1970 Referring Provider (PT): Dr. Erroll Luna   Encounter Date: 05/01/2019  PT End of Session - 05/01/19 1521    Visit Number  2    Number of Visits  2    PT Start Time  1405    PT Stop Time  1455    PT Time Calculation (min)  50 min    Activity Tolerance  Patient tolerated treatment well    Behavior During Therapy  [facility] for tasks assessed/performed       Past Medical History:  Diagnosis Date  . Asthma   . Family history of breast cancer   . Family history of lung cancer     Past Surgical History:  Procedure Laterality Date  . BREAST BIOPSY Right   . BREAST LUMPECTOMY WITH RADIOACTIVE SEED AND SENTINEL LYMPH NODE BIOPSY Left 04/04/2019   Procedure: RADIOCATIVE SEED GUIDED LEFT BREAST LUMPECTOMY, LEFT AXILLARY SENTINEL LYMPH NODE BIOPSY;  Surgeon: Erroll Luna, MD;  Location: [place];  Service: General;  Laterality: Left;  . NASAL SINUS SURGERY    . PORTACATH PLACEMENT N/A 04/04/2019   Procedure: INSERTION PORT-A-CATH WITH ULTRASOUND;  Surgeon: Erroll Luna, MD;  Location: [place];  Service: General;  Laterality: N/A;    There were no vitals filed for this visit.  Subjective Assessment - 05/01/19 1410    Subjective  Patient reported she underwent a left lumpectomy and sentinel node biopsy on 04/04/2019 with 1 negative axillary node removed. She will begin chemotherapy for triple positive breast cancer on 05/12/2019 followed by radiation and anti-estrogen therapy.    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 Patient reported she underwent a left lumpectomy and sentinel node biopsy on 04/04/2019 with 1  negative axillary node removed with a Ki67 of 60%.    Patient Stated Goals  See if my arm is ok    Currently in Pain?  No/denies         [facility] PT Assessment - 05/01/19 0001      Assessment   Medical Diagnosis  s/p left lumpectomy and SLNB    Referring Provider (PT)  Dr. [name] Cornett    Onset Date/Surgical Date  04/04/19    Hand Dominance  Right    Prior Therapy  Baselines      Precautions   Precautions  Other (comment)    Precaution Comments  recent surgery      Restrictions   Weight Bearing Restrictions  No      Balance Screen   Has the patient fallen in the past 6 months  No    Has the patient had a decrease in activity level because of a fear of falling?   No    Is the patient reluctant to leave their home because of a fear of falling?   No      Home Environment   Living Environment  Private residence    Living Arrangements  Spouse/significant other;Children   Husband, 36 and 28 y.o. kids   Available Help at Discharge  Family      Prior Function   Level of Independence  Independent    Vocation  Full time employment    Physiological scientist  Leisure  She is walking 30 min/day 5 days/week      Cognition   Overall Cognitive Status  Within Functional Limits for tasks assessed      Observation/Other Assessments   Observations  Breast incision is healing well. Left axillary incision appears to be healing but is not completely closed. Some redness present from where she had a bandaid on. No edema present.      Posture/Postural Control   Posture/Postural Control  Postural limitations    Postural Limitations  Rounded Shoulders;Forward head      ROM / Strength   AROM / PROM / Strength  AROM      AROM   AROM Assessment Site  Shoulder    Right/Left Shoulder  Left    Left Shoulder Extension  42 Degrees    Left Shoulder Flexion  128 Degrees    Left Shoulder ABduction  152 Degrees    Left Shoulder Internal Rotation  73 Degrees    Left Shoulder  External Rotation  86 Degrees        LYMPHEDEMA/ONCOLOGY QUESTIONNAIRE - 05/01/19 1420      Type   Cancer Type  Left breast cancer      Surgeries   Lumpectomy Date  04/04/19    Sentinel Lymph Node Biopsy Date  04/04/19    Number Lymph Nodes Removed  1      Treatment   Active Chemotherapy Treatment  Yes    Date  05/12/19    Past Chemotherapy Treatment  No    Active Radiation Treatment  No    Past Radiation Treatment  No    Current Hormone Treatment  No    Past Hormone Therapy  No      What other symptoms do you have   Are you Having Heaviness or Tightness  No    Are you having Pain  No    Are you having pitting edema  No    Is it Hard or Difficult finding clothes that fit  No    Do you have infections  Yes    Comments  Recently treated with antibiotics for her axillary incision infection    Is there Decreased scar mobility  No    [place]emmer Sign  No      Lymphedema Assessments   Lymphedema Assessments  Upper extremities      Right Upper Extremity Lymphedema   10 cm Proximal to Olecranon Process  25.7 cm    Olecranon Process  22.5 cm    10 cm Proximal to Ulnar Styloid Process  19.6 cm    Just Proximal to Ulnar Styloid Process  13.8 cm    Across Hand at [employer]  17.8 cm    At [place] of 2nd Digit  5.7 cm      Left Upper Extremity Lymphedema   10 cm Proximal to Olecranon Process  25 cm    Olecranon Process  22 cm    10 cm Proximal to Ulnar Styloid Process  18.8 cm    Just Proximal to Ulnar Styloid Process  13.4 cm    Across Hand at [employer]  17.3 cm    At [place] of 2nd Digit  5.5 cm        Quick Dash - 05/01/19 0001    Open a tight or new jar  No difficulty    Do heavy household chores (wash walls, wash floors)  No difficulty    Carry a shopping bag or briefcase  No difficulty    Wash your back  Mild difficulty    Use a knife to cut food  No difficulty    Recreational activities in which you take some force or impact through your arm, shoulder, or hand  (golf, hammering, tennis)  No difficulty    During the past week, to what extent has your arm, shoulder or hand problem interfered with your normal social activities with family, friends, neighbors, or groups?  Not at all    During the past week, to what extent has your arm, shoulder or hand problem limited your work or other regular daily activities  Not at all    Arm, shoulder, or hand pain.  None    Tingling (pins and needles) in your arm, shoulder, or hand  None    Difficulty Sleeping  No difficulty    DASH Score  2.27 %                     PT Education - 05/01/19 1454    Education Details  Reviewed lymphedema risk reduction and previous HEP. Issued new HEP with closed chain shoulder flexion and abduction exercises.    Person(s) Educated  Patient    Methods  Explanation;Demonstration;Handout    Comprehension  Verbalized understanding;Returned demonstration          PT Long Term Goals - 05/01/19 1527      PT LONG TERM GOAL #1   Title  Patient will demonstrate she has regained full shoulder ROM and function post operatively compared to baselines.    Time  8    Period  Weeks    Status  Partially Met            Plan - 05/01/19 1522    Clinical Impression Statement  Patient is doing very well s/p left lumpectomy and sentinel node biopsy. She will begin chemotherapy on 05/12/2019 with weekly Taxol and every 3 weeks Herceptin. Her shoulder ROM is slightly limited with flexion and abduction but she has not yet begun her HEP due to her axillary incision being infected. She tolerated doing all exercises today in the clinic and I anticipate she will do well on her own with her shoulder ROM doing her HEP. She is already back to work and that has required her to use her arm and she reports no issues with that. There are no other PT needs at this time.    PT Treatment/Interventions  ADLs/Self Care Home Management;Therapeutic exercise;Patient/family education    PT Next  Visit Plan  D/C    PT Home Exercise Plan  Post op shoulder ROM HEP and closed chain shoulder ROM exercises    Consulted and Agree with Plan of Care  Patient       Patient will benefit from skilled therapeutic intervention in order to improve the following deficits and impairments:  Postural dysfunction, Decreased range of motion, Decreased knowledge of precautions, Pain, Impaired UE functional use  Visit Diagnosis: Malignant neoplasm of upper-inner quadrant of left breast in female, estrogen receptor positive (HCC)  Abnormal posture  Aftercare following surgery for neoplasm  Stiffness of left shoulder, not elsewhere classified     Problem List Patient Active Problem List   Diagnosis Date Noted  . Intraductal papilloma of breast, right 04/21/2019  . Genetic testing 03/31/2019  . Family history of breast cancer   . Family history of lung cancer   . Malignant neoplasm of upper-inner quadrant of left breast in female, estrogen receptor  positive ([place]) 03/21/2019  . NASAL POLYP 09/22/2007  . ALLERGIC RHINITIS 09/22/2007  . ASTHMA 09/22/2007  . PERSONAL HISTORY OF ALLERGY TO LATEX 09/22/2007   PHYSICAL THERAPY DISCHARGE SUMMARY  Visits from Start of Care: 2  Current functional level related to goals / functional outcomes: See above for objective findings   Remaining deficits: Mild left shoulder active flexion and abduction ROM deficits.   Education / Equipment: Lymphedema risk reduction and post op shoulder ROM HEP Plan: Patient agrees to discharge.  Patient goals were partially met. Patient is being discharged due to being pleased with the current functional level.  ?????         Annia Friendly, [place] 05/01/19 3:29 PM  [place], [place], [address] Phone: [phone]   Fax:  [phone]  Name: Natajah Derderian Azpeitia MRN: [id number] Date of Birth: 1971/03/23

## 2019-05-01 NOTE — Patient Instructions (Addendum)
Closed Chain: Shoulder Abduction / Adduction - on Wall    One hand on wall, step to side and return. Stepping causes shoulder to abduct and adduct. Step _5__ times, holding 5 seconds, _2__ times per day. Closed Chain: Shoulder Flexion / Extension - on Wall    Hands on wall, step backward. Return. Stepping causes shoulder flexion and extension Do _5__ times,holding 5-10 seconds, _2__ times per day.  http://ss.exer.us/265   Copyright  VHI. All rights reserved.

## 2019-05-11 ENCOUNTER — Other Ambulatory Visit: Payer: Self-pay

## 2019-05-11 DIAGNOSIS — Z170 Estrogen receptor positive status [ER+]: Secondary | ICD-10-CM

## 2019-05-11 DIAGNOSIS — C50212 Malignant neoplasm of upper-inner quadrant of left female breast: Secondary | ICD-10-CM

## 2019-05-12 ENCOUNTER — Other Ambulatory Visit: Payer: Self-pay

## 2019-05-12 ENCOUNTER — Inpatient Hospital Stay: Payer: BC Managed Care – PPO

## 2019-05-12 ENCOUNTER — Other Ambulatory Visit: Payer: Self-pay | Admitting: Oncology

## 2019-05-12 ENCOUNTER — Other Ambulatory Visit: Payer: Self-pay | Admitting: Hematology

## 2019-05-12 ENCOUNTER — Telehealth: Payer: Self-pay | Admitting: *Deleted

## 2019-05-12 VITALS — BP 119/76 | HR 78 | Temp 98.9°F | Resp 18

## 2019-05-12 DIAGNOSIS — Z95828 Presence of other vascular implants and grafts: Secondary | ICD-10-CM

## 2019-05-12 DIAGNOSIS — F419 Anxiety disorder, unspecified: Secondary | ICD-10-CM | POA: Diagnosis not present

## 2019-05-12 DIAGNOSIS — Z5111 Encounter for antineoplastic chemotherapy: Secondary | ICD-10-CM | POA: Diagnosis not present

## 2019-05-12 DIAGNOSIS — Z170 Estrogen receptor positive status [ER+]: Secondary | ICD-10-CM

## 2019-05-12 DIAGNOSIS — C50212 Malignant neoplasm of upper-inner quadrant of left female breast: Secondary | ICD-10-CM | POA: Diagnosis not present

## 2019-05-12 DIAGNOSIS — Z79899 Other long term (current) drug therapy: Secondary | ICD-10-CM | POA: Diagnosis not present

## 2019-05-12 LAB — CMP (CANCER CENTER ONLY)
ALT: 55 U/L — ABNORMAL HIGH (ref 0–44)
AST: 22 U/L (ref 15–41)
Albumin: 4 g/dL (ref 3.5–5.0)
Alkaline Phosphatase: 93 U/L (ref 38–126)
Anion gap: 11 (ref 5–15)
BUN: 14 mg/dL (ref 6–20)
CO2: 24 mmol/L (ref 22–32)
Calcium: 8.9 mg/dL (ref 8.9–10.3)
Chloride: 106 mmol/L (ref 98–111)
Creatinine: 0.63 mg/dL (ref 0.44–1.00)
GFR, Est AFR Am: >60 mL/min (ref >60)
GFR, Estimated: >60 mL/min (ref >60)
Glucose, Bld: 86 mg/dL (ref 70–99)
Potassium: 4 mmol/L (ref 3.5–5.1)
Sodium: 141 mmol/L (ref 135–145)
Total Bilirubin: 0.4 mg/dL (ref 0.3–1.2)
Total Protein: 6.9 g/dL (ref 6.5–8.1)

## 2019-05-12 LAB — CBC WITH DIFFERENTIAL (CANCER CENTER ONLY)
Abs Immature Granulocytes: 0.02 10*3/uL (ref 0.00–0.07)
Basophils Absolute: 0.1 10*3/uL (ref 0.0–0.1)
Basophils Relative: 1 %
Eosinophils Absolute: 0.5 10*3/uL (ref 0.0–0.5)
Eosinophils Relative: 6 %
HCT: 35.3 % — ABNORMAL LOW (ref 36.0–46.0)
Hemoglobin: 12.2 g/dL (ref 12.0–15.0)
Immature Granulocytes: 0 %
Lymphocytes Relative: 32 %
Lymphs Abs: 2.6 10*3/uL (ref 0.7–4.0)
MCH: 30.8 pg (ref 26.0–34.0)
MCHC: 34.6 g/dL (ref 30.0–36.0)
MCV: 89.1 fL (ref 80.0–100.0)
Monocytes Absolute: 0.7 10*3/uL (ref 0.1–1.0)
Monocytes Relative: 9 %
Neutro Abs: 4.2 10*3/uL (ref 1.7–7.7)
Neutrophils Relative %: 52 %
Platelet Count: 327 10*3/uL (ref 150–400)
RBC: 3.96 MIL/uL (ref 3.87–5.11)
RDW: 12.1 % (ref 11.5–15.5)
WBC Count: 8.1 10*3/uL (ref 4.0–10.5)
nRBC: 0 % (ref 0.0–0.2)

## 2019-05-12 MED ORDER — HEPARIN SOD (PORK) LOCK FLUSH 100 UNIT/ML IV SOLN
500.0000 [IU] | Freq: Once | INTRAVENOUS | Status: AC | PRN
  Administered 2019-05-12: 500 [IU]
  Filled 2019-05-12: qty 5

## 2019-05-12 MED ORDER — SODIUM CHLORIDE 0.9% FLUSH
10.0000 mL | INTRAVENOUS | Status: DC | PRN
  Administered 2019-05-12: 10 mL via INTRAVENOUS
  Filled 2019-05-12: qty 10

## 2019-05-12 MED ORDER — ACETAMINOPHEN 325 MG PO TABS
ORAL_TABLET | ORAL | Status: AC
  Filled 2019-05-12: qty 2

## 2019-05-12 MED ORDER — SODIUM CHLORIDE 0.9 % IV SOLN
20.0000 mg | Freq: Once | INTRAVENOUS | Status: AC
  Administered 2019-05-12: 20 mg via INTRAVENOUS
  Filled 2019-05-12: qty 20

## 2019-05-12 MED ORDER — DIPHENHYDRAMINE HCL 50 MG/ML IJ SOLN
50.0000 mg | Freq: Once | INTRAMUSCULAR | Status: AC
  Administered 2019-05-12: 50 mg via INTRAVENOUS

## 2019-05-12 MED ORDER — FAMOTIDINE IN NACL 20-0.9 MG/50ML-% IV SOLN
20.0000 mg | Freq: Once | INTRAVENOUS | Status: AC
  Administered 2019-05-12: 20 mg via INTRAVENOUS

## 2019-05-12 MED ORDER — SODIUM CHLORIDE 0.9 % IV SOLN
80.0000 mg/m2 | Freq: Once | INTRAVENOUS | Status: AC
  Administered 2019-05-12: 126 mg via INTRAVENOUS
  Filled 2019-05-12: qty 21

## 2019-05-12 MED ORDER — SODIUM CHLORIDE 0.9% FLUSH
10.0000 mL | INTRAVENOUS | Status: DC | PRN
  Administered 2019-05-12: 10 mL
  Filled 2019-05-12: qty 10

## 2019-05-12 MED ORDER — ACETAMINOPHEN 325 MG PO TABS
650.0000 mg | ORAL_TABLET | Freq: Once | ORAL | Status: AC
  Administered 2019-05-12: 650 mg via ORAL

## 2019-05-12 MED ORDER — FAMOTIDINE IN NACL 20-0.9 MG/50ML-% IV SOLN
INTRAVENOUS | Status: AC
  Filled 2019-05-12: qty 50

## 2019-05-12 MED ORDER — DIPHENHYDRAMINE HCL 50 MG/ML IJ SOLN
INTRAMUSCULAR | Status: AC
  Filled 2019-05-12: qty 1

## 2019-05-12 MED ORDER — TRASTUZUMAB-DKST CHEMO 150 MG IV SOLR
4.0000 mg/kg | Freq: Once | INTRAVENOUS | Status: AC
  Administered 2019-05-12: 231 mg via INTRAVENOUS
  Filled 2019-05-12: qty 11

## 2019-05-12 MED ORDER — SODIUM CHLORIDE 0.9 % IV SOLN
Freq: Once | INTRAVENOUS | Status: AC
  Administered 2019-05-12: 13:00:00 via INTRAVENOUS
  Filled 2019-05-12: qty 250

## 2019-05-12 NOTE — Progress Notes (Signed)
Dose of Trastuzumab clarified w/ Dr. Lindi Adie (Dr. Burr Medico & [name], NP are on PAL today)--keep Trastuzumab as weekly (MD note says q3 weeks). Kennith Center, Pharm.D., CPP 05/12/2019@1 :31 PM

## 2019-05-12 NOTE — Telephone Encounter (Signed)
Spoke with patient during her 1st cycle Taxol/Herceptin to check in.  Patient states everything has gone smoothly.  Encouraged her to call with any needs or concerns.

## 2019-05-12 NOTE — Patient Instructions (Signed)
[facility] Discharge Instructions for Patients Receiving Chemotherapy  Today you received the following chemotherapy agents Trastuzumab and Taxol  To help prevent nausea and vomiting after your treatment, we encourage you to take your nausea medication as directed.    If you develop nausea and vomiting that is not controlled by your nausea medication, call the clinic.   BELOW ARE SYMPTOMS THAT SHOULD BE REPORTED IMMEDIATELY:  *FEVER GREATER THAN 100.5 F  *CHILLS WITH OR WITHOUT FEVER  NAUSEA AND VOMITING THAT IS NOT CONTROLLED WITH YOUR NAUSEA MEDICATION  *UNUSUAL SHORTNESS OF BREATH  *UNUSUAL BRUISING OR BLEEDING  TENDERNESS IN MOUTH AND THROAT WITH OR WITHOUT PRESENCE OF ULCERS  *URINARY PROBLEMS  *BOWEL PROBLEMS  UNUSUAL RASH Items with * indicate a potential emergency and should be followed up as soon as possible.  Feel free to call the clinic should you have any questions or concerns. The clinic phone number is [phone].  Please show the CHEMO ALERT CARD at check-in to the Emergency Department and triage nurse.  Trastuzumab injection for infusion What is this medicine? TRASTUZUMAB (tras TOO zoo mab) is a monoclonal antibody. It is used to treat breast cancer and stomach cancer. This medicine may be used for other purposes; ask your health care provider or pharmacist if you have questions. COMMON BRAND NAME(S): Herceptin, Herzuma, KANJINTI, Ogivri, Ontruzant, Trazimera What should I tell my health care provider before I take this medicine? They need to know if you have any of these conditions:  heart disease  heart failure  lung or breathing disease, like asthma  an unusual or allergic reaction to trastuzumab, benzyl alcohol, or other medications, foods, dyes, or preservatives  pregnant or trying to get pregnant  breast-feeding How should I use this medicine? This drug is given as an infusion into a vein. It is administered in a hospital  or clinic by a specially trained health care professional. Talk to your pediatrician regarding the use of this medicine in children. This medicine is not approved for use in children. Overdosage: If you think you have taken too much of this medicine contact a poison control center or emergency room at once. NOTE: This medicine is only for you. Do not share this medicine with others. What if I miss a dose? It is important not to miss a dose. Call your doctor or health care professional if you are unable to keep an appointment. What may interact with this medicine? This medicine may interact with the following medications:  certain types of chemotherapy, such as daunorubicin, doxorubicin, epirubicin, and idarubicin This list may not describe all possible interactions. Give your health care provider a list of all the medicines, herbs, non-prescription drugs, or dietary supplements you use. Also tell them if you smoke, drink alcohol, or use illegal drugs. Some items may interact with your medicine. What should I watch for while using this medicine? Visit your doctor for checks on your progress. Report any side effects. Continue your course of treatment even though you feel ill unless your doctor tells you to stop. Call your doctor or health care professional for advice if you get a fever, chills or sore throat, or other symptoms of a cold or flu. Do not treat yourself. Try to avoid being around people who are sick. You may experience fever, chills and shaking during your first infusion. These effects are usually mild and can be treated with other medicines. Report any side effects during the infusion to your health care professional.   Fever and chills usually do not happen with later infusions. Do not become pregnant while taking this medicine or for 7 months after stopping it. Women should inform their doctor if they wish to become pregnant or think they might be pregnant. Women of child-bearing potential  will need to have a negative pregnancy test before starting this medicine. There is a potential for serious side effects to an unborn child. Talk to your health care professional or pharmacist for more information. Do not breast-feed an infant while taking this medicine or for 7 months after stopping it. Women must use effective birth control with this medicine. What side effects may I notice from receiving this medicine? Side effects that you should report to your doctor or health care professional as soon as possible:  allergic reactions like skin rash, itching or hives, swelling of the face, lips, or tongue  chest pain or palpitations  cough  dizziness  feeling faint or lightheaded, falls  fever  general ill feeling or flu-like symptoms  signs of worsening heart failure like breathing problems; swelling in your legs and feet  unusually weak or tired Side effects that usually do not require medical attention (report to your doctor or health care professional if they continue or are bothersome):  bone pain  changes in taste  diarrhea  joint pain  nausea/vomiting  weight loss This list may not describe all possible side effects. Call your doctor for medical advice about side effects. You may report side effects to FDA at 1-800-FDA-1088. Where should I keep my medicine? This drug is given in a hospital or clinic and will not be stored at home. NOTE: This sheet is a summary. It may not cover all possible information. If you have questions about this medicine, talk to your doctor, pharmacist, or health care provider.  2020 Elsevier/Gold Standard (2016-06-23 14:37:52)  Paclitaxel injection What is this medicine? PACLITAXEL (PAK li TAX el) is a chemotherapy drug. It targets fast dividing cells, like cancer cells, and causes these cells to die. This medicine is used to treat ovarian cancer, breast cancer, lung cancer, Kaposi's sarcoma, and other cancers. This medicine may be  used for other purposes; ask your health care provider or pharmacist if you have questions. COMMON BRAND NAME(S): Onxol, Taxol What should I tell my health care provider before I take this medicine? They need to know if you have any of these conditions:  history of irregular heartbeat  liver disease  low blood counts, like low white cell, platelet, or red cell counts  lung or breathing disease, like asthma  tingling of the fingers or toes, or other nerve disorder  an unusual or allergic reaction to paclitaxel, alcohol, polyoxyethylated castor oil, other chemotherapy, other medicines, foods, dyes, or preservatives  pregnant or trying to get pregnant  breast-feeding How should I use this medicine? This drug is given as an infusion into a vein. It is administered in a hospital or clinic by a specially trained health care professional. Talk to your pediatrician regarding the use of this medicine in children. Special care may be needed. Overdosage: If you think you have taken too much of this medicine contact a poison control center or emergency room at once. NOTE: This medicine is only for you. Do not share this medicine with others. What if I miss a dose? It is important not to miss your dose. Call your doctor or health care professional if you are unable to keep an appointment. What may interact with this   medicine? Do not take this medicine with any of the following medications:  disulfiram  metronidazole This medicine may also interact with the following medications:  antiviral medicines for hepatitis, HIV or AIDS  certain antibiotics like erythromycin and clarithromycin  certain medicines for fungal infections like ketoconazole and itraconazole  certain medicines for seizures like carbamazepine, phenobarbital, phenytoin  gemfibrozil  nefazodone  rifampin  St. John's wort This list may not describe all possible interactions. Give your health care provider a list of  all the medicines, herbs, non-prescription drugs, or dietary supplements you use. Also tell them if you smoke, drink alcohol, or use illegal drugs. Some items may interact with your medicine. What should I watch for while using this medicine? Your condition will be monitored carefully while you are receiving this medicine. You will need important blood work done while you are taking this medicine. This medicine can cause serious allergic reactions. To reduce your risk you will need to take other medicine(s) before treatment with this medicine. If you experience allergic reactions like skin rash, itching or hives, swelling of the face, lips, or tongue, tell your doctor or health care professional right away. In some cases, you may be given additional medicines to help with side effects. Follow all directions for their use. This drug may make you feel generally unwell. This is not uncommon, as chemotherapy can affect healthy cells as well as cancer cells. Report any side effects. Continue your course of treatment even though you feel ill unless your doctor tells you to stop. Call your doctor or health care professional for advice if you get a fever, chills or sore throat, or other symptoms of a cold or flu. Do not treat yourself. This drug decreases your body's ability to fight infections. Try to avoid being around people who are sick. This medicine may increase your risk to bruise or bleed. Call your doctor or health care professional if you notice any unusual bleeding. Be careful brushing and flossing your teeth or using a toothpick because you may get an infection or bleed more easily. If you have any dental work done, tell your dentist you are receiving this medicine. Avoid taking products that contain aspirin, acetaminophen, ibuprofen, naproxen, or ketoprofen unless instructed by your doctor. These medicines may hide a fever. Do not become pregnant while taking this medicine. Women should inform their  doctor if they wish to become pregnant or think they might be pregnant. There is a potential for serious side effects to an unborn child. Talk to your health care professional or pharmacist for more information. Do not breast-feed an infant while taking this medicine. Men are advised not to father a child while receiving this medicine. This product may contain alcohol. Ask your pharmacist or healthcare provider if this medicine contains alcohol. Be sure to tell all healthcare providers you are taking this medicine. Certain medicines, like metronidazole and disulfiram, can cause an unpleasant reaction when taken with alcohol. The reaction includes flushing, headache, nausea, vomiting, sweating, and increased thirst. The reaction can last from 30 minutes to several hours. What side effects may I notice from receiving this medicine? Side effects that you should report to your doctor or health care professional as soon as possible:  allergic reactions like skin rash, itching or hives, swelling of the face, lips, or tongue  breathing problems  changes in vision  fast, irregular heartbeat  high or low blood pressure  mouth sores  pain, tingling, numbness in the hands or feet    signs of decreased platelets or bleeding - bruising, pinpoint red spots on the skin, black, tarry stools, blood in the urine  signs of decreased red blood cells - unusually weak or tired, feeling faint or lightheaded, falls  signs of infection - fever or chills, cough, sore throat, pain or difficulty passing urine  signs and symptoms of liver injury like dark yellow or brown urine; general ill feeling or flu-like symptoms; light-colored stools; loss of appetite; nausea; right upper belly pain; unusually weak or tired; yellowing of the eyes or skin  swelling of the ankles, feet, hands  unusually slow heartbeat Side effects that usually do not require medical attention (report to your doctor or health care professional if  they continue or are bothersome):  diarrhea  hair loss  loss of appetite  muscle or joint pain  nausea, vomiting  pain, redness, or irritation at site where injected  tiredness This list may not describe all possible side effects. Call your doctor for medical advice about side effects. You may report side effects to FDA at 1-800-FDA-1088. Where should I keep my medicine? This drug is given in a hospital or clinic and will not be stored at home. NOTE: This sheet is a summary. It may not cover all possible information. If you have questions about this medicine, talk to your doctor, pharmacist, or health care provider.  2020 Elsevier/Gold Standard (2017-03-02 13:14:55)  

## 2019-05-12 NOTE — Patient Instructions (Signed)

## 2019-05-15 ENCOUNTER — Telehealth: Payer: Self-pay | Admitting: *Deleted

## 2019-05-15 DIAGNOSIS — M810 Age-related osteoporosis without current pathological fracture: Secondary | ICD-10-CM | POA: Diagnosis not present

## 2019-05-15 DIAGNOSIS — Z3040 Encounter for surveillance of contraceptives, unspecified: Secondary | ICD-10-CM | POA: Diagnosis not present

## 2019-05-15 DIAGNOSIS — C50919 Malignant neoplasm of unspecified site of unspecified female breast: Secondary | ICD-10-CM | POA: Diagnosis not present

## 2019-05-18 NOTE — Progress Notes (Signed)
[facility]   [phone] [phone]   Clinic Follow up Note   Patient Care Team: Wenda Low, MD as PCP - General (Internal Medicine) Rockwell Germany, RN as Oncology Nurse Navigator Mauro Kaufmann, RN as Oncology Nurse Navigator Erroll Luna, MD as Consulting Physician (General Surgery) Truitt Merle, MD as Consulting Physician (Hematology) Gery Pray, MD as Consulting Physician (Radiation Oncology)  Date of Service:  05/19/2019  CHIEF COMPLAINT: F/u of left breast cancer   SUMMARY OF ONCOLOGIC HISTORY: Oncology History Overview Note  Cancer Staging Malignant neoplasm of upper-inner quadrant of left breast in female, estrogen receptor positive ([place]) Staging form: Breast, AJCC 8th Edition - Clinical stage from 03/22/2019: Stage IA (cT1c, cN0, cM0, G3, ER+, PR+, HER2+) - Signed by Truitt Merle, MD on 03/22/2019    Malignant neoplasm of upper-inner quadrant of left breast in female, estrogen receptor positive ([place])  03/10/2019 Mammogram   Diagnostic Mammogram 03/10/19  IMPRESSION: 1. Suspicious mass in the 10 o'clock position of the left breast, 3cm from the nipple measuring 12x7x45m. Biopsy is indicated. 2. Indeterminate mass in the 5:30 o'clock position of the left breast, 5cm from the nipple, measuring 9x6x775m Although this may reflect a fibroadenoma, tissue sampling is recommended.   03/16/2019 Initial Biopsy   Diagnosis 03/16/19  1. 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 SEE NOTE 2. Breast, left, needle core biopsy, 5:30 o'clock - FIBROADENOMA   03/16/2019 Receptors her2   Results: IMMUNOHISTOCHEMICAL AND MORPHOMETRIC ANALYSIS PERFORMED MANUALLY The tumor cells are POSITIVE for Her2 (3+). Estrogen Receptor: 100%, POSITIVE, STRONG STAINING INTENSITY Progesterone Receptor: 100%, POSITIVE, STRONG STAINING INTENSITY Proliferation Marker Ki67: 60%   03/21/2019 Initial Diagnosis   Malignant neoplasm  of upper-inner quadrant of left breast in female, estrogen receptor positive ([place]  03/22/2019 Cancer Staging   Staging form: Breast, AJCC 8th Edition - Clinical stage from 03/22/2019: Stage IA (cT1c, cN0, cM0, G3, ER+, PR+, HER2+) - Signed by FeTruitt MerleMD on 03/22/2019   03/31/2019 Genetic Testing   VUS in CTNNA1 called [id number] (Intronic) was identified on the [facility] Common Hereditary Cancers Panel. The Common Hereditary Cancers Panel offered by [facility] includes sequencing and/or deletion duplication testing of the following 47 genes: APC, ATM, AXIN2, BARD1, BMPR1A, BRCA1, BRCA2, BRIP1, CDH1, CDKN2A (p14ARF), CDKN2A (p16INK4a), CKD4, CHEK2, CTNNA1, DICER1, EPCAM (Deletion/duplication testing only), GREM1 (promoter region deletion/duplication testing only), KIT, MEN1, MLH1, MSH2, MSH3, MSH6, MUTYH, NBN, NF1, NHTL1, PALB2, PDGFRA, PMS2, POLD1, POLE, PTEN, RAD50, RAD51C, RAD51D, SDHB, SDHC, SDHD, SMAD4, SMARCA4. STK11, TP53, TSC1, TSC2, and VHL.  The following genes were evaluated for sequence changes only: SDHA and HOXB13 c.251G>A variant only. The report date is 03/30/2019.    03/31/2019 Breast MRI   MRI breast 03/31/19  IMPRESSION: 1. 5 millimeter enhancing oval mass in the anteromedial aspect of the RIGHT breast warranting further evaluation. Given its anterior depth and visibility on noncontrast images, ultrasound is recommended to determine if there is a sonographic correlate. If no sonographic correlate is identified to guide biopsy, MRI biopsy of the RIGHT breast is recommended. 2. Known malignancy in the UPPER INNER QUADRANT of the LEFT breast measuring 1.0 centimeters. 3. 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   04/04/2019 Surgery   RADIOCATIVE SEED GUIDED LEFT BREAST LUMPECTOMY, LEFT AXILLARY SENTINEL LYMPH NODE BIOPSY and PAC placement  By Dr. CoBrantley Stage9/22/20    04/04/2019 Pathology Results   DIAGNOSIS: 04/04/19  A.  BREAST, LEFT, LUMPECTOMY:   -  Invasive ductal carcinoma, Nottingham grade 3 of 3, 1.3 cm  -  Ductal carcinoma in-situ, intermediate grade  -  Margins uninvolved by carcinoma (0.1 cm; posterior margin; see  comment)  -  Previous biopsy site changes present  -  See oncology table and comment below   B. BREAST, LEFT, ADDITIONAL MEDIAL MARGIN, EXCISION:  -  Fibrocystic changes  -  No residual carcinoma identified   C. LYMPH NODE, LEFT AXILLARY, SENTINEL, BIOPSY:  -   No carcinoma identified in one lymph node (0/1)    04/04/2019 Cancer Staging   Staging form: Breast, AJCC 8th Edition - Pathologic stage from 04/04/2019: Stage IA (pT1c, pN0, cM0, G3, ER+, PR+, HER2+) - Signed by Truitt Merle, MD on 04/21/2019   04/11/2019 Pathology Results   Diagnosis Breast, right, needle core biopsy, 2 o'clock - SMALL INTRADUCTAL PAPILLOMA. SEE NOTE - SMALL FIBROADENOMA - NEGATIVE FOR CARCINOMA   05/12/2019 -  Chemotherapy   adjuvant Weekly Taxol and Herceptin for 12 weeks starting 05/12/19. Followed by maintenance Herceptin q3weeks to complete 1 year treatment.       CURRENT THERAPY:  adjuvant Weekly Taxol and Herceptin for 12 weeks starting 05/12/19. Followed by maintenance Herceptin q3weeks to complete 1 year treatment.   INTERVAL HISTORY:  Chelsea Hobbs is here for a follow up and treatment. She presents to the clinic alone. She notes she tolerated first week of treatment well with no N&V, change in appetite or significant change in energy. She tolerated well overall.  She has colitis and uses Mesalamine Suppositories. She had questions about her medications.     REVIEW OF SYSTEMS:   Constitutional: Denies fevers, chills or abnormal weight loss Eyes: Denies blurriness of vision Ears, nose, mouth, throat, and face: Denies mucositis or sore throat Respiratory: Denies cough, dyspnea or wheezes Cardiovascular: Denies palpitation, chest discomfort or lower extremity swelling Gastrointestinal:  Denies nausea, heartburn  or change in bowel habits Skin: Denies abnormal skin rashes Lymphatics: Denies new lymphadenopathy or easy bruising Neurological:Denies numbness, tingling or new weaknesses Behavioral/Psych: Mood is stable, no new changes  All other systems were reviewed with the patient and are negative.  MEDICAL HISTORY:  Past Medical History:  Diagnosis Date  . Asthma   . Family history of breast cancer   . Family history of lung cancer     SURGICAL HISTORY: Past Surgical History:  Procedure Laterality Date  . BREAST BIOPSY Right   . BREAST LUMPECTOMY WITH RADIOACTIVE SEED AND SENTINEL LYMPH NODE BIOPSY Left 04/04/2019   Procedure: RADIOCATIVE SEED GUIDED LEFT BREAST LUMPECTOMY, LEFT AXILLARY SENTINEL LYMPH NODE BIOPSY;  Surgeon: Erroll Luna, MD;  Location: [facility];  Service: General;  Laterality: Left;  . NASAL SINUS SURGERY    . PORTACATH PLACEMENT N/A 04/04/2019   Procedure: INSERTION PORT-A-CATH WITH ULTRASOUND;  Surgeon: Erroll Luna, MD;  Location: [facility];  Service: General;  Laterality: N/A;    I have reviewed the social history and family history with the patient and they are unchanged from previous note.  ALLERGIES:  is allergic to doxycycline and latex.  MEDICATIONS:  Current Outpatient Medications  Medication Sig Dispense Refill  . albuterol (PROVENTIL HFA;VENTOLIN HFA) 108 (90 BASE) MCG/ACT inhaler Inhale 2 puffs into the lungs every 6 (six) hours as needed. For shortness of breath    . ALPRAZolam (XANAX) 1 MG tablet Take 0.5 mg by mouth 2 (two) times daily as needed.    . Ascorbic  Acid (VITAMIN C) 1000 MG tablet Take 1,000 mg by mouth daily.    . B Complex Vitamins (VITAMIN-B COMPLEX) TABS Take 1 tablet by mouth daily.    . Calcium Carbonate-Vit D-Min (CALCIUM 1200 PO) Take 1 tablet by mouth daily.    . cholecalciferol (VITAMIN D3) 25 MCG (1000 UT) tablet Take 2,000 Units by mouth daily.     Marland Kitchen doxycycline (VIBRA-TABS) 100 MG tablet Take  100 mg by mouth daily.    . fexofenadine (ALLEGRA) 60 MG tablet Take 60 mg by mouth 2 (two) times daily.    Marland Kitchen ibuprofen (ADVIL) 800 MG tablet Take 1 tablet (800 mg total) by mouth every 8 (eight) hours as needed. 30 tablet 0  . ibuprofen (ADVIL,MOTRIN) 200 MG tablet Take 400 mg by mouth every 6 (six) hours as needed. For headaches    . lidocaine-prilocaine (EMLA) cream Apply to affected area once 30 g 3  . Multiple Vitamin (MULTIVITAMIN WITH MINERALS) TABS Take 1 tablet by mouth daily.    . Omega-3 Fatty Acids (FISH OIL PO) Take 1 tablet by mouth daily.    . ondansetron (ZOFRAN) 8 MG tablet Take 1 tablet (8 mg total) by mouth 2 (two) times daily as needed (Nausea or vomiting). 30 tablet 1  . oxyCODONE (OXY IR/ROXICODONE) 5 MG immediate release tablet Take 1 tablet (5 mg total) by mouth every 6 (six) hours as needed for severe pain. (Patient not taking: Reported on 04/21/2019) 15 tablet 0  . prochlorperazine (COMPAZINE) 10 MG tablet Take 1 tablet (10 mg total) by mouth every 6 (six) hours as needed (Nausea or vomiting). 30 tablet 1  . SUMAtriptan (IMITREX) 100 MG tablet Take 100 mg by mouth.    . Zinc 25 MG TABS Take by mouth.     No current facility-administered medications for this visit.    Facility-Administered Medications Ordered in Other Visits  Medication Dose Route Frequency Provider Last Rate Last Dose  . dexamethasone (DECADRON) 20 mg in sodium chloride 0.9 % 50 mL IVPB  20 mg Intravenous Once Betsy Coder B, MD      . heparin lock flush 100 unit/mL  500 Units Intracatheter Once PRN Ladell Pier, MD      . PACLitaxel (TAXOL) 126 mg in sodium chloride 0.9 % 250 mL chemo infusion (</= 103m/m2)  80 mg/m2 (Treatment Plan Recorded) Intravenous Once SLadell Pier MD      . sodium chloride flush (NS) 0.9 % injection 10 mL  10 mL Intracatheter PRN SLadell Pier MD      . trastuzumab-dkst (OGIVRI) 105 mg in sodium chloride 0.9 % 250 mL chemo infusion  2 mg/kg (Treatment Plan  Recorded) Intravenous Once SLadell Pier MD        PHYSICAL EXAMINATION: ECOG PERFORMANCE STATUS: 0 - Asymptomatic  Vitals:   05/19/19 1343  BP: 127/81  Pulse: 70  Resp: 18  Temp: 98.3 F (36.8 C)  SpO2: 100%   Filed Weights   05/19/19 1343  Weight: 126 lb 11.2 oz (57.5 kg)    GENERAL:alert, no distress and comfortable SKIN: skin color, texture, turgor are normal, no rashes or significant lesions EYES: normal, Conjunctiva are pink and non-injected, sclera clear  NECK: supple, thyroid normal size, non-tender, without nodularity LYMPH:  no palpable lymphadenopathy in the cervical, axillary  LUNGS: clear to auscultation and percussion with normal breathing effort HEART: regular rate & rhythm and no murmurs and no lower extremity edema ABDOMEN:abdomen soft, non-tender and normal bowel sounds Musculoskeletal:no  cyanosis of digits and no clubbing  NEURO: alert & oriented x 3 with fluent speech, no focal motor/sensory deficits BREAST: S/p left lumpectomy: surgical incisions healed well.   LABORATORY DATA:  I have reviewed the data as listed CBC Latest Ref Rng & Units 05/19/2019 05/12/2019 03/22/2019  WBC 4.0 - 10.5 K/uL 7.7 8.1 7.4  Hemoglobin 12.0 - 15.0 g/dL 11.0(L) 12.2 13.4  Hematocrit 36.0 - 46.0 % 31.8(L) 35.3(L) 39.3  Platelets 150 - 400 K/uL 358 327 343     CMP Latest Ref Rng & Units 05/19/2019 05/12/2019 03/22/2019  Glucose 70 - 99 mg/dL 103(H) 86 101(H)  BUN 6 - 20 mg/dL _0 Creatinine 0.44 - 1.00 mg/dL 0.68 0.63 0.78  Sodium 135 - 145 mmol/L 139 141 142  Potassium 3.5 - 5.1 mmol/L 3.8 4.0 3.3(L)  Chloride 98 - 111 mmol/L 105 106 105  CO2 22 - 32 mmol/L _1 Calcium 8.9 - 10.3 mg/dL 8.8(L) 8.9 9.6  Total Protein 6.5 - 8.1 g/dL 6.8 6.9 7.9  Total Bilirubin 0.3 - 1.2 mg/dL 0.3 0.4 0.6  Alkaline Phos 38 - 126 U/L 76 93 52  AST 15 - 41 U/L _2 ALT 0 - 44 U/L 35 55(H) 15      RADIOGRAPHIC STUDIES: I have personally reviewed the radiological  images as listed and agreed with the findings in the report. No results found.   ASSESSMENT & PLAN:  Chelsea Hobbs is a 48 y.o. 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 She underwent left breast lumpectomy and SLNB on 04/04/19. Her DCIS margins were close (49m).  -Her breast MRI from 03/31/19 showed known left breast cancer and 1.546menhancing right breast mass. Her right breast biopsy from 04/11/19 showed small intraductal papilloma which is benign. Dr. CoBrantley Stageecommended lumpectomy to remove this in the future.  -Will discuss her option of b/l mastectomy or lumpectomy after chemo treatment.  -Given her 1.3cm tumor, node negative disease I started her on weekly Taxol and anti-Her2 Herceptin for 12 weeks on 05/12/19.This will be followed byHerceptinmaintenanceq3weeks to complete 1 year of treatment. The goal of chemo is curative  -After chemo, she will still proceed with adjuvant Radiation and antiestrogen therapy after chemo.  -She tolerated first week of Taxol and transtuzumab well with no significant side effects.  -Labs reviewed and shows mild anemia likely related to chemo. Overall adequate to proceed with week 2 Taxol and Transtuzumab today.  -Continue therapy weekly   -f/u in 2 weeks    2. Bone Health  -Per pt when she had her IUD removed in 03/2019. Her repeated hormonal labs show she is not postmenopausal. She still has periods. Will retest in 1 year and hold on baseline DEXA scan for now.    3. Genetic Testing was negative for pathogenetic nutations   4. Anxiety  -Previously on Xanax and Effexor.  -Managed by her PCP -She will f/u with SW office as needed about her finances and stress.   5. Colitis  -She has colitis and uses Mesalamine Suppositories. Will monitor on chemo.    PLAN: -Labs reviewed and adequate to proceed with week 2 Taxol and  Transtuzumab today and continue weekly  -f/u with me or APP every 2 weeks    No problem-specific Assessment & Plan notes found for this encounter.   No orders of the defined types were placed  in this encounter.  All questions were answered. The patient knows to call the clinic with any problems, questions or concerns. No barriers to learning was detected. I spent 15 minutes counseling the patient face to face. The total time spent in the appointment was 20 minutes and more than 50% was on counseling and review of test results     Truitt Merle, MD 05/19/2019   I, Joslyn Devon, am acting as scribe for Truitt Merle, MD.   I have reviewed the above documentation for accuracy and completeness, and I agree with the above.

## 2019-05-19 ENCOUNTER — Other Ambulatory Visit: Payer: BC Managed Care – PPO

## 2019-05-19 ENCOUNTER — Ambulatory Visit: Payer: BC Managed Care – PPO

## 2019-05-19 ENCOUNTER — Inpatient Hospital Stay (HOSPITAL_BASED_OUTPATIENT_CLINIC_OR_DEPARTMENT_OTHER): Payer: BC Managed Care – PPO | Admitting: Hematology

## 2019-05-19 ENCOUNTER — Inpatient Hospital Stay: Payer: BC Managed Care – PPO

## 2019-05-19 ENCOUNTER — Encounter: Payer: Self-pay | Admitting: Hematology

## 2019-05-19 ENCOUNTER — Ambulatory Visit: Payer: BC Managed Care – PPO | Admitting: Hematology

## 2019-05-19 ENCOUNTER — Telehealth: Payer: Self-pay | Admitting: *Deleted

## 2019-05-19 ENCOUNTER — Other Ambulatory Visit: Payer: Self-pay

## 2019-05-19 ENCOUNTER — Inpatient Hospital Stay: Payer: BC Managed Care – PPO | Attending: Hematology

## 2019-05-19 VITALS — BP 127/81 | HR 70 | Temp 98.3°F | Resp 18 | Ht 62.0 in | Wt 126.7 lb

## 2019-05-19 DIAGNOSIS — Z95828 Presence of other vascular implants and grafts: Secondary | ICD-10-CM

## 2019-05-19 DIAGNOSIS — D649 Anemia, unspecified: Secondary | ICD-10-CM | POA: Diagnosis not present

## 2019-05-19 DIAGNOSIS — Z79899 Other long term (current) drug therapy: Secondary | ICD-10-CM | POA: Insufficient documentation

## 2019-05-19 DIAGNOSIS — Z170 Estrogen receptor positive status [ER+]: Secondary | ICD-10-CM

## 2019-05-19 DIAGNOSIS — K529 Noninfective gastroenteritis and colitis, unspecified: Secondary | ICD-10-CM | POA: Diagnosis not present

## 2019-05-19 DIAGNOSIS — C50212 Malignant neoplasm of upper-inner quadrant of left female breast: Secondary | ICD-10-CM

## 2019-05-19 DIAGNOSIS — Z5111 Encounter for antineoplastic chemotherapy: Secondary | ICD-10-CM | POA: Diagnosis not present

## 2019-05-19 DIAGNOSIS — F419 Anxiety disorder, unspecified: Secondary | ICD-10-CM | POA: Diagnosis not present

## 2019-05-19 LAB — CMP (CANCER CENTER ONLY)
ALT: 35 U/L (ref 0–44)
AST: 19 U/L (ref 15–41)
Albumin: 4.1 g/dL (ref 3.5–5.0)
Alkaline Phosphatase: 76 U/L (ref 38–126)
Anion gap: 8 (ref 5–15)
BUN: 14 mg/dL (ref 6–20)
CO2: 26 mmol/L (ref 22–32)
Calcium: 8.8 mg/dL — ABNORMAL LOW (ref 8.9–10.3)
Chloride: 105 mmol/L (ref 98–111)
Creatinine: 0.68 mg/dL (ref 0.44–1.00)
GFR, Est AFR Am: >60 mL/min (ref >60)
GFR, Estimated: >60 mL/min (ref >60)
Glucose, Bld: 103 mg/dL — ABNORMAL HIGH (ref 70–99)
Potassium: 3.8 mmol/L (ref 3.5–5.1)
Sodium: 139 mmol/L (ref 135–145)
Total Bilirubin: 0.3 mg/dL (ref 0.3–1.2)
Total Protein: 6.8 g/dL (ref 6.5–8.1)

## 2019-05-19 LAB — CBC WITH DIFFERENTIAL (CANCER CENTER ONLY)
Abs Immature Granulocytes: 0.05 10*3/uL (ref 0.00–0.07)
Basophils Absolute: 0.1 10*3/uL (ref 0.0–0.1)
Basophils Relative: 1 %
Eosinophils Absolute: 0.3 10*3/uL (ref 0.0–0.5)
Eosinophils Relative: 4 %
HCT: 31.8 % — ABNORMAL LOW (ref 36.0–46.0)
Hemoglobin: 11 g/dL — ABNORMAL LOW (ref 12.0–15.0)
Immature Granulocytes: 1 %
Lymphocytes Relative: 33 %
Lymphs Abs: 2.5 10*3/uL (ref 0.7–4.0)
MCH: 31.1 pg (ref 26.0–34.0)
MCHC: 34.6 g/dL (ref 30.0–36.0)
MCV: 89.8 fL (ref 80.0–100.0)
Monocytes Absolute: 0.4 10*3/uL (ref 0.1–1.0)
Monocytes Relative: 5 %
Neutro Abs: 4.4 10*3/uL (ref 1.7–7.7)
Neutrophils Relative %: 56 %
Platelet Count: 358 10*3/uL (ref 150–400)
RBC: 3.54 MIL/uL — ABNORMAL LOW (ref 3.87–5.11)
RDW: 11.8 % (ref 11.5–15.5)
WBC Count: 7.7 10*3/uL (ref 4.0–10.5)
nRBC: 0 % (ref 0.0–0.2)

## 2019-05-19 MED ORDER — SODIUM CHLORIDE 0.9 % IV SOLN
Freq: Once | INTRAVENOUS | Status: AC
  Administered 2019-05-19: 14:00:00 via INTRAVENOUS
  Filled 2019-05-19: qty 250

## 2019-05-19 MED ORDER — ACETAMINOPHEN 325 MG PO TABS
ORAL_TABLET | ORAL | Status: AC
  Filled 2019-05-19: qty 2

## 2019-05-19 MED ORDER — TRASTUZUMAB-DKST CHEMO 150 MG IV SOLR
2.0000 mg/kg | Freq: Once | INTRAVENOUS | Status: AC
  Administered 2019-05-19: 105 mg via INTRAVENOUS
  Filled 2019-05-19: qty 5

## 2019-05-19 MED ORDER — DIPHENHYDRAMINE HCL 50 MG/ML IJ SOLN
50.0000 mg | Freq: Once | INTRAMUSCULAR | Status: AC
  Administered 2019-05-19: 50 mg via INTRAVENOUS

## 2019-05-19 MED ORDER — DIPHENHYDRAMINE HCL 50 MG/ML IJ SOLN
INTRAMUSCULAR | Status: AC
  Filled 2019-05-19: qty 1

## 2019-05-19 MED ORDER — SODIUM CHLORIDE 0.9 % IV SOLN
20.0000 mg | Freq: Once | INTRAVENOUS | Status: AC
  Administered 2019-05-19: 20 mg via INTRAVENOUS
  Filled 2019-05-19: qty 20

## 2019-05-19 MED ORDER — SODIUM CHLORIDE 0.9 % IV SOLN
80.0000 mg/m2 | Freq: Once | INTRAVENOUS | Status: AC
  Administered 2019-05-19: 126 mg via INTRAVENOUS
  Filled 2019-05-19: qty 21

## 2019-05-19 MED ORDER — SODIUM CHLORIDE 0.9% FLUSH
10.0000 mL | INTRAVENOUS | Status: DC | PRN
  Administered 2019-05-19: 10 mL via INTRAVENOUS
  Filled 2019-05-19: qty 10

## 2019-05-19 MED ORDER — FAMOTIDINE IN NACL 20-0.9 MG/50ML-% IV SOLN
INTRAVENOUS | Status: AC
  Filled 2019-05-19: qty 50

## 2019-05-19 MED ORDER — FAMOTIDINE IN NACL 20-0.9 MG/50ML-% IV SOLN
20.0000 mg | Freq: Once | INTRAVENOUS | Status: AC
  Administered 2019-05-19: 20 mg via INTRAVENOUS

## 2019-05-19 MED ORDER — ACETAMINOPHEN 325 MG PO TABS
650.0000 mg | ORAL_TABLET | Freq: Once | ORAL | Status: AC
  Administered 2019-05-19: 650 mg via ORAL

## 2019-05-19 MED ORDER — SODIUM CHLORIDE 0.9% FLUSH
10.0000 mL | INTRAVENOUS | Status: DC | PRN
  Administered 2019-05-19: 10 mL
  Filled 2019-05-19: qty 10

## 2019-05-19 MED ORDER — HEPARIN SOD (PORK) LOCK FLUSH 100 UNIT/ML IV SOLN
500.0000 [IU] | Freq: Once | INTRAVENOUS | Status: AC | PRN
  Administered 2019-05-19: 500 [IU]
  Filled 2019-05-19: qty 5

## 2019-05-19 NOTE — Telephone Encounter (Signed)
Left message to check in with patient to see how her week went.  She is scheduled for cycle 2 Taxol today.

## 2019-05-19 NOTE — Patient Instructions (Signed)

## 2019-05-19 NOTE — Patient Instructions (Signed)
[facility] Discharge Instructions for Patients Receiving Chemotherapy  Today you received the following chemotherapy agents: Trastuzumab, Taxol  To help prevent nausea and vomiting after your treatment, we encourage you to take your nausea medication as directed.   If you develop nausea and vomiting that is not controlled by your nausea medication, call the clinic.   BELOW ARE SYMPTOMS THAT SHOULD BE REPORTED IMMEDIATELY:  *FEVER GREATER THAN 100.5 F  *CHILLS WITH OR WITHOUT FEVER  NAUSEA AND VOMITING THAT IS NOT CONTROLLED WITH YOUR NAUSEA MEDICATION  *UNUSUAL SHORTNESS OF BREATH  *UNUSUAL BRUISING OR BLEEDING  TENDERNESS IN MOUTH AND THROAT WITH OR WITHOUT PRESENCE OF ULCERS  *URINARY PROBLEMS  *BOWEL PROBLEMS  UNUSUAL RASH Items with * indicate a potential emergency and should be followed up as soon as possible.  Feel free to call the clinic should you have any questions or concerns. The clinic phone number is [phone].  Please show the CHEMO ALERT CARD at check-in to the Emergency Department and triage nurse.   

## 2019-05-22 ENCOUNTER — Telehealth: Payer: Self-pay | Admitting: Hematology

## 2019-05-22 NOTE — Telephone Encounter (Signed)
Returned patient's phone call regarding rescheduling an appointment, informed patient time requested for is not available. Patient will keep appointment as Is.

## 2019-05-26 ENCOUNTER — Other Ambulatory Visit: Payer: BC Managed Care – PPO

## 2019-05-26 ENCOUNTER — Inpatient Hospital Stay: Payer: BC Managed Care – PPO

## 2019-05-26 ENCOUNTER — Ambulatory Visit: Payer: BC Managed Care – PPO

## 2019-05-26 ENCOUNTER — Other Ambulatory Visit: Payer: Self-pay

## 2019-05-26 VITALS — BP 118/88 | HR 81 | Temp 98.3°F | Resp 18

## 2019-05-26 DIAGNOSIS — Z170 Estrogen receptor positive status [ER+]: Secondary | ICD-10-CM

## 2019-05-26 DIAGNOSIS — F419 Anxiety disorder, unspecified: Secondary | ICD-10-CM | POA: Diagnosis not present

## 2019-05-26 DIAGNOSIS — Z95828 Presence of other vascular implants and grafts: Secondary | ICD-10-CM

## 2019-05-26 DIAGNOSIS — Z5111 Encounter for antineoplastic chemotherapy: Secondary | ICD-10-CM | POA: Diagnosis not present

## 2019-05-26 DIAGNOSIS — Z79899 Other long term (current) drug therapy: Secondary | ICD-10-CM | POA: Diagnosis not present

## 2019-05-26 DIAGNOSIS — C50212 Malignant neoplasm of upper-inner quadrant of left female breast: Secondary | ICD-10-CM

## 2019-05-26 DIAGNOSIS — K529 Noninfective gastroenteritis and colitis, unspecified: Secondary | ICD-10-CM | POA: Diagnosis not present

## 2019-05-26 DIAGNOSIS — D649 Anemia, unspecified: Secondary | ICD-10-CM | POA: Diagnosis not present

## 2019-05-26 LAB — CMP (CANCER CENTER ONLY)
ALT: 53 U/L — ABNORMAL HIGH (ref 0–44)
AST: 24 U/L (ref 15–41)
Albumin: 4.1 g/dL (ref 3.5–5.0)
Alkaline Phosphatase: 69 U/L (ref 38–126)
Anion gap: 10 (ref 5–15)
BUN: 12 mg/dL (ref 6–20)
CO2: 26 mmol/L (ref 22–32)
Calcium: 8.9 mg/dL (ref 8.9–10.3)
Chloride: 103 mmol/L (ref 98–111)
Creatinine: 0.73 mg/dL (ref 0.44–1.00)
GFR, Est AFR Am: >60 mL/min (ref >60)
GFR, Estimated: >60 mL/min (ref >60)
Glucose, Bld: 130 mg/dL — ABNORMAL HIGH (ref 70–99)
Potassium: 4 mmol/L (ref 3.5–5.1)
Sodium: 139 mmol/L (ref 135–145)
Total Bilirubin: 0.2 mg/dL — ABNORMAL LOW (ref 0.3–1.2)
Total Protein: 7 g/dL (ref 6.5–8.1)

## 2019-05-26 LAB — CBC WITH DIFFERENTIAL (CANCER CENTER ONLY)
Abs Immature Granulocytes: 0.03 10*3/uL (ref 0.00–0.07)
Basophils Absolute: 0.1 10*3/uL (ref 0.0–0.1)
Basophils Relative: 1 %
Eosinophils Absolute: 0.2 10*3/uL (ref 0.0–0.5)
Eosinophils Relative: 3 %
HCT: 33.3 % — ABNORMAL LOW (ref 36.0–46.0)
Hemoglobin: 11.3 g/dL — ABNORMAL LOW (ref 12.0–15.0)
Immature Granulocytes: 1 %
Lymphocytes Relative: 39 %
Lymphs Abs: 2.3 10*3/uL (ref 0.7–4.0)
MCH: 30.9 pg (ref 26.0–34.0)
MCHC: 33.9 g/dL (ref 30.0–36.0)
MCV: 91 fL (ref 80.0–100.0)
Monocytes Absolute: 0.5 10*3/uL (ref 0.1–1.0)
Monocytes Relative: 8 %
Neutro Abs: 2.8 10*3/uL (ref 1.7–7.7)
Neutrophils Relative %: 48 %
Platelet Count: 372 10*3/uL (ref 150–400)
RBC: 3.66 MIL/uL — ABNORMAL LOW (ref 3.87–5.11)
RDW: 12.4 % (ref 11.5–15.5)
WBC Count: 5.8 10*3/uL (ref 4.0–10.5)
nRBC: 0 % (ref 0.0–0.2)

## 2019-05-26 MED ORDER — TRASTUZUMAB-DKST CHEMO 150 MG IV SOLR
2.0000 mg/kg | Freq: Once | INTRAVENOUS | Status: AC
  Administered 2019-05-26: 105 mg via INTRAVENOUS
  Filled 2019-05-26: qty 5

## 2019-05-26 MED ORDER — SODIUM CHLORIDE 0.9 % IV SOLN
80.0000 mg/m2 | Freq: Once | INTRAVENOUS | Status: AC
  Administered 2019-05-26: 16:00:00 126 mg via INTRAVENOUS
  Filled 2019-05-26: qty 21

## 2019-05-26 MED ORDER — FAMOTIDINE IN NACL 20-0.9 MG/50ML-% IV SOLN
20.0000 mg | Freq: Once | INTRAVENOUS | Status: AC
  Administered 2019-05-26: 15:00:00 20 mg via INTRAVENOUS

## 2019-05-26 MED ORDER — DIPHENHYDRAMINE HCL 50 MG/ML IJ SOLN
INTRAMUSCULAR | Status: AC
  Filled 2019-05-26: qty 1

## 2019-05-26 MED ORDER — ACETAMINOPHEN 325 MG PO TABS
ORAL_TABLET | ORAL | Status: AC
  Filled 2019-05-26: qty 2

## 2019-05-26 MED ORDER — SODIUM CHLORIDE 0.9% FLUSH
10.0000 mL | INTRAVENOUS | Status: DC | PRN
  Administered 2019-05-26: 10 mL via INTRAVENOUS
  Filled 2019-05-26: qty 10

## 2019-05-26 MED ORDER — SODIUM CHLORIDE 0.9% FLUSH
10.0000 mL | INTRAVENOUS | Status: DC | PRN
  Administered 2019-05-26: 10 mL
  Filled 2019-05-26: qty 10

## 2019-05-26 MED ORDER — SODIUM CHLORIDE 0.9 % IV SOLN
20.0000 mg | Freq: Once | INTRAVENOUS | Status: AC
  Administered 2019-05-26: 15:00:00 20 mg via INTRAVENOUS
  Filled 2019-05-26: qty 20

## 2019-05-26 MED ORDER — SODIUM CHLORIDE 0.9 % IV SOLN
Freq: Once | INTRAVENOUS | Status: AC
  Administered 2019-05-26: 14:00:00 via INTRAVENOUS
  Filled 2019-05-26: qty 250

## 2019-05-26 MED ORDER — FAMOTIDINE IN NACL 20-0.9 MG/50ML-% IV SOLN
INTRAVENOUS | Status: AC
  Filled 2019-05-26: qty 50

## 2019-05-26 MED ORDER — DIPHENHYDRAMINE HCL 50 MG/ML IJ SOLN
50.0000 mg | Freq: Once | INTRAMUSCULAR | Status: AC
  Administered 2019-05-26: 50 mg via INTRAVENOUS

## 2019-05-26 MED ORDER — HEPARIN SOD (PORK) LOCK FLUSH 100 UNIT/ML IV SOLN
500.0000 [IU] | Freq: Once | INTRAVENOUS | Status: AC | PRN
  Administered 2019-05-26: 17:00:00 500 [IU]
  Filled 2019-05-26: qty 5

## 2019-05-26 MED ORDER — ACETAMINOPHEN 325 MG PO TABS
650.0000 mg | ORAL_TABLET | Freq: Once | ORAL | Status: AC
  Administered 2019-05-26: 650 mg via ORAL

## 2019-05-26 NOTE — Patient Instructions (Signed)
[facility] Discharge Instructions for Patients Receiving Chemotherapy  Today you received the following chemotherapy agents Trastuzumab (OGIVRI) & Paclitaxel (TAXOL).  To help prevent nausea and vomiting after your treatment, we encourage you to take your nausea medication as prescribed.   If you develop nausea and vomiting that is not controlled by your nausea medication, call the clinic.   BELOW ARE SYMPTOMS THAT SHOULD BE REPORTED IMMEDIATELY:  *FEVER GREATER THAN 100.5 F  *CHILLS WITH OR WITHOUT FEVER  NAUSEA AND VOMITING THAT IS NOT CONTROLLED WITH YOUR NAUSEA MEDICATION  *UNUSUAL SHORTNESS OF BREATH  *UNUSUAL BRUISING OR BLEEDING  TENDERNESS IN MOUTH AND THROAT WITH OR WITHOUT PRESENCE OF ULCERS  *URINARY PROBLEMS  *BOWEL PROBLEMS  UNUSUAL RASH Items with * indicate a potential emergency and should be followed up as soon as possible.  Feel free to call the clinic should you have any questions or concerns. The clinic phone number is [phone].  Please show the CHEMO ALERT CARD at check-in to the Emergency Department and triage nurse.  Coronavirus (COVID-19) Are you at risk?  Are you at risk for the Coronavirus (COVID-19)?  To be considered HIGH RISK for Coronavirus (COVID-19), you have to meet the following criteria:  . Traveled to [place], [place], [place], [place] or [place]; or in the [place] to [place], [place], [place], or [place]; and have fever, cough, and shortness of breath within the last 2 weeks of travel OR . Been in close contact with a person diagnosed with COVID-19 within the last 2 weeks and have fever, cough, and shortness of breath . IF YOU DO NOT MEET THESE CRITERIA, YOU ARE CONSIDERED LOW RISK FOR COVID-19.  What to do if you are HIGH RISK for COVID-19?  . If you are having a medical emergency, call 911. . Seek medical care right away. Before you go to a doctor's office, urgent care or emergency department, call  ahead and tell them about your recent travel, contact with someone diagnosed with COVID-19, and your symptoms. You should receive instructions from your physician's office regarding next steps of care.  . When you arrive at healthcare provider, tell the healthcare staff immediately you have returned from visiting [place], [place], [place], [place] or [place]; or traveled in the [place] to [place], [place], [place], or [place]; in the last two weeks or you have been in close contact with a person diagnosed with COVID-19 in the last 2 weeks.   . Tell the health care staff about your symptoms: fever, cough and shortness of breath. . After you have been seen by a medical provider, you will be either: o Tested for (COVID-19) and discharged home on quarantine except to seek medical care if symptoms worsen, and asked to  - Stay home and avoid contact with others until you get your results (4-5 days)  - Avoid travel on public transportation if possible (such as bus, train, or airplane) or o Sent to the Emergency Department by EMS for evaluation, COVID-19 testing, and possible admission depending on your condition and test results.  What to do if you are LOW RISK for COVID-19?  Reduce your risk of any infection by using the same precautions used for avoiding the common cold or flu:  . Wash your hands often with soap and warm water for at least 20 seconds.  If soap and water are not readily available, use an alcohol-based hand sanitizer with at least 60% alcohol.  .   If coughing or sneezing, cover your mouth and nose by coughing or sneezing into the elbow areas of your shirt or coat, into a tissue or into your sleeve (not your hands). . Avoid shaking hands with others and consider head nods or verbal greetings only. . Avoid touching your eyes, nose, or mouth with unwashed hands.  . Avoid close contact with people who are sick. . Avoid places or events with large numbers of people in one location,  like concerts or sporting events. . Carefully consider travel plans you have or are making. . If you are planning any travel outside or inside the [place], visit the CDC's Travelers' Health webpage for the latest health notices. . If you have some symptoms but not all symptoms, continue to monitor at home and seek medical attention if your symptoms worsen. . If you are having a medical emergency, call 911.   [facility] / e-Visit: [email or web address]         [facility]: [facility]: [phone]                   [facility]: [phone]

## 2019-05-26 NOTE — Patient Instructions (Signed)

## 2019-06-01 NOTE — Progress Notes (Signed)
[name]   [phone] [phone]   Clinic Follow up Note   Patient Care Team: Wenda Low, MD as PCP - General (Internal Medicine) Rockwell Germany, RN as Oncology Nurse Navigator Mauro Kaufmann, RN as Oncology Nurse Navigator Erroll Luna, MD as Consulting Physician (General Surgery) Truitt Merle, MD as Consulting Physician (Hematology) Gery Pray, MD as Consulting Physician (Radiation Oncology)  Date of Service:  06/02/2019  CHIEF COMPLAINT: F/u of left breast cancer  SUMMARY OF ONCOLOGIC HISTORY: Oncology History Overview Note  Cancer Staging Malignant neoplasm of upper-inner quadrant of left breast in female, estrogen receptor positive ([place]) Staging form: Breast, AJCC 8th Edition - Clinical stage from 03/22/2019: Stage IA (cT1c, cN0, cM0, G3, ER+, PR+, HER2+) - Signed by Truitt Merle, MD on 03/22/2019    Malignant neoplasm of upper-inner quadrant of left breast in female, estrogen receptor positive ([place])  03/10/2019 Mammogram   Diagnostic Mammogram 03/10/19  IMPRESSION: 1. Suspicious mass in the 10 o'clock position of the left breast, 3cm from the nipple measuring 12x7x75m. Biopsy is indicated. 2. Indeterminate mass in the 5:30 o'clock position of the left breast, 5cm from the nipple, measuring 9x6x733m Although this may reflect a fibroadenoma, tissue sampling is recommended.   03/16/2019 Initial Biopsy   Diagnosis 03/16/19  1. 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 SEE NOTE 2. Breast, left, needle core biopsy, 5:30 o'clock - FIBROADENOMA   03/16/2019 Receptors her2   Results: IMMUNOHISTOCHEMICAL AND MORPHOMETRIC ANALYSIS PERFORMED MANUALLY The tumor cells are POSITIVE for Her2 (3+). Estrogen Receptor: 100%, POSITIVE, STRONG STAINING INTENSITY Progesterone Receptor: 100%, POSITIVE, STRONG STAINING INTENSITY Proliferation Marker Ki67: 60%   03/21/2019 Initial Diagnosis   Malignant  neoplasm of upper-inner quadrant of left breast in female, estrogen receptor positive ([place]  03/22/2019 Cancer Staging   Staging form: Breast, AJCC 8th Edition - Clinical stage from 03/22/2019: Stage IA (cT1c, cN0, cM0, G3, ER+, PR+, HER2+) - Signed by FeTruitt MerleMD on 03/22/2019   03/31/2019 Genetic Testing   VUS in CTNNA1 called [id number] (Intronic) was identified on the [facility] Common Hereditary Cancers Panel. The Common Hereditary Cancers Panel offered by [facility] includes sequencing and/or deletion duplication testing of the following 47 genes: APC, ATM, AXIN2, BARD1, BMPR1A, BRCA1, BRCA2, BRIP1, CDH1, CDKN2A (p14ARF), CDKN2A (p16INK4a), CKD4, CHEK2, CTNNA1, DICER1, EPCAM (Deletion/duplication testing only), GREM1 (promoter region deletion/duplication testing only), KIT, MEN1, MLH1, MSH2, MSH3, MSH6, MUTYH, NBN, NF1, NHTL1, PALB2, PDGFRA, PMS2, POLD1, POLE, PTEN, RAD50, RAD51C, RAD51D, SDHB, SDHC, SDHD, SMAD4, SMARCA4. STK11, TP53, TSC1, TSC2, and VHL.  The following genes were evaluated for sequence changes only: SDHA and HOXB13 c.251G>A variant only. The report date is 03/30/2019.    03/31/2019 Breast MRI   MRI breast 03/31/19  IMPRESSION: 1. 5 millimeter enhancing oval mass in the anteromedial aspect of the RIGHT breast warranting further evaluation. Given its anterior depth and visibility on noncontrast images, ultrasound is recommended to determine if there is a sonographic correlate. If no sonographic correlate is identified to guide biopsy, MRI biopsy of the RIGHT breast is recommended. 2. Known malignancy in the UPPER INNER QUADRANT of the LEFT breast measuring 1.0 centimeters. 3. 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   04/04/2019 Surgery   RADIOCATIVE SEED GUIDED LEFT BREAST LUMPECTOMY, LEFT AXILLARY SENTINEL LYMPH NODE BIOPSY and PAC placement  By Dr. CoBrantley Stage9/22/20    04/04/2019 Pathology Results   DIAGNOSIS: 04/04/19  A. BREAST,  LEFT,  LUMPECTOMY:  -  Invasive ductal carcinoma, Nottingham grade 3 of 3, 1.3 cm  -  Ductal carcinoma in-situ, intermediate grade  -  Margins uninvolved by carcinoma (0.1 cm; posterior margin; see  comment)  -  Previous biopsy site changes present  -  See oncology table and comment below   B. BREAST, LEFT, ADDITIONAL MEDIAL MARGIN, EXCISION:  -  Fibrocystic changes  -  No residual carcinoma identified   C. LYMPH NODE, LEFT AXILLARY, SENTINEL, BIOPSY:  -   No carcinoma identified in one lymph node (0/1)    04/04/2019 Cancer Staging   Staging form: Breast, AJCC 8th Edition - Pathologic stage from 04/04/2019: Stage IA (pT1c, pN0, cM0, G3, ER+, PR+, HER2+) - Signed by Truitt Merle, MD on 04/21/2019   04/11/2019 Pathology Results   Diagnosis Breast, right, needle core biopsy, 2 o'clock - SMALL INTRADUCTAL PAPILLOMA. SEE NOTE - SMALL FIBROADENOMA - NEGATIVE FOR CARCINOMA   05/12/2019 -  Chemotherapy   adjuvant Weekly Taxol and Herceptin for 12 weeks starting 05/12/19. Followed by maintenance Herceptin q3weeks to complete 1 year treatment.       CURRENT THERAPY:  adjuvant Weekly Taxol and Herceptin for 12 weeks starting 05/12/19. Followed by maintenance Herceptin q3weeks to complete 1 year treatment.   INTERVAL HISTORY:  Chelsea Hobbs is here for a follow up and treatment. She presents to the clinic alone. She feels she has tolerated treatment well so far. She denies nausea, but did have upset stomach for 1 day without diarrhea. She feels her scalp soreness. Overall not much hair loss. She wonders can she continue Vitamin C, Zinc or Vitamin D on chemo.     REVIEW OF SYSTEMS:   Constitutional: Denies fevers, chills or abnormal weight loss Eyes: Denies blurriness of vision Ears, nose, mouth, throat, and face: Denies mucositis or sore throat Respiratory: Denies cough, dyspnea or wheezes Cardiovascular: Denies palpitation, chest discomfort or lower extremity swelling Gastrointestinal:   Denies nausea, heartburn or change in bowel habits Skin: Denies abnormal skin rashes Lymphatics: Denies new lymphadenopathy or easy bruising Neurological:Denies numbness, tingling or new weaknesses Behavioral/Psych: Mood is stable, no new changes  All other systems were reviewed with the patient and are negative.  MEDICAL HISTORY:  Past Medical History:  Diagnosis Date  . Asthma   . Family history of breast cancer   . Family history of lung cancer     SURGICAL HISTORY: Past Surgical History:  Procedure Laterality Date  . BREAST BIOPSY Right   . BREAST LUMPECTOMY WITH RADIOACTIVE SEED AND SENTINEL LYMPH NODE BIOPSY Left 04/04/2019   Procedure: RADIOCATIVE SEED GUIDED LEFT BREAST LUMPECTOMY, LEFT AXILLARY SENTINEL LYMPH NODE BIOPSY;  Surgeon: Erroll Luna, MD;  Location: [place];  Service: General;  Laterality: Left;  . NASAL SINUS SURGERY    . PORTACATH PLACEMENT N/A 04/04/2019   Procedure: INSERTION PORT-A-CATH WITH ULTRASOUND;  Surgeon: Erroll Luna, MD;  Location: [place];  Service: General;  Laterality: N/A;    I have reviewed the social history and family history with the patient and they are unchanged from previous note.  ALLERGIES:  is allergic to doxycycline and latex.  MEDICATIONS:  Current Outpatient Medications  Medication Sig Dispense Refill  . albuterol (PROVENTIL HFA;VENTOLIN HFA) 108 (90 BASE) MCG/ACT inhaler Inhale 2 puffs into the lungs every 6 (six) hours as needed. For shortness of breath    . ALPRAZolam (XANAX) 1 MG tablet Take 0.5 mg by mouth 2 (two) times daily as  needed.    . Ascorbic Acid (VITAMIN C) 1000 MG tablet Take 1,000 mg by mouth daily.    . B Complex Vitamins (VITAMIN-B COMPLEX) TABS Take 1 tablet by mouth daily.    . Calcium Carbonate-Vit D-Min (CALCIUM 1200 PO) Take 1 tablet by mouth daily.    . cholecalciferol (VITAMIN D3) 25 MCG (1000 UT) tablet Take 2,000 Units by mouth daily.     Marland Kitchen doxycycline  (VIBRA-TABS) 100 MG tablet Take 100 mg by mouth daily.    . fexofenadine (ALLEGRA) 60 MG tablet Take 60 mg by mouth 2 (two) times daily.    Marland Kitchen ibuprofen (ADVIL) 800 MG tablet Take 1 tablet (800 mg total) by mouth every 8 (eight) hours as needed. 30 tablet 0  . ibuprofen (ADVIL,MOTRIN) 200 MG tablet Take 400 mg by mouth every 6 (six) hours as needed. For headaches    . lidocaine-prilocaine (EMLA) cream Apply to affected area once 30 g 3  . Multiple Vitamin (MULTIVITAMIN WITH MINERALS) TABS Take 1 tablet by mouth daily.    . Omega-3 Fatty Acids (FISH OIL PO) Take 1 tablet by mouth daily.    Marland Kitchen omeprazole (PRILOSEC) 20 MG capsule Take 1 capsule (20 mg total) by mouth daily. 30 capsule 2  . ondansetron (ZOFRAN) 8 MG tablet Take 1 tablet (8 mg total) by mouth 2 (two) times daily as needed (Nausea or vomiting). 30 tablet 1  . oxyCODONE (OXY IR/ROXICODONE) 5 MG immediate release tablet Take 1 tablet (5 mg total) by mouth every 6 (six) hours as needed for severe pain. (Patient not taking: Reported on 04/21/2019) 15 tablet 0  . prochlorperazine (COMPAZINE) 10 MG tablet Take 1 tablet (10 mg total) by mouth every 6 (six) hours as needed (Nausea or vomiting). 30 tablet 1  . SUMAtriptan (IMITREX) 100 MG tablet Take 100 mg by mouth.    . Zinc 25 MG TABS Take by mouth.     No current facility-administered medications for this visit.    Facility-Administered Medications Ordered in Other Visits  Medication Dose Route Frequency Provider Last Rate Last Dose  . sodium chloride flush (NS) 0.9 % injection 10 mL  10 mL Intracatheter PRN Ladell Pier, MD   10 mL at 06/02/19 1637    PHYSICAL EXAMINATION: ECOG PERFORMANCE STATUS: 1 - Symptomatic but completely ambulatory  Vitals:   06/02/19 1251  BP: 133/83  Pulse: 75  Resp: 18  Temp: 98.3 F (36.8 C)  SpO2: 100%   Filed Weights   06/02/19 1251  Weight: 125 lb 6.4 oz (56.9 kg)    GENERAL:alert, no distress and comfortable SKIN: skin color, texture,  turgor are normal, no rashes or significant lesions EYES: normal, Conjunctiva are pink and non-injected, sclera clear  NECK: supple, thyroid normal size, non-tender, without nodularity LYMPH:  no palpable lymphadenopathy in the cervical, axillary  LUNGS: clear to auscultation and percussion with normal breathing effort HEART: regular rate & rhythm and no murmurs and no lower extremity edema ABDOMEN:abdomen soft, non-tender and normal bowel sounds Musculoskeletal:no cyanosis of digits and no clubbing  NEURO: alert & oriented x 3 with fluent speech, no focal motor/sensory deficits BREAST: S/p left lumpectomy: Surgical incision healed well. No palpable mass, nodules or adenopathy bilaterally. Breast exam benign.   LABORATORY DATA:  I have reviewed the data as listed CBC Latest Ref Rng & Units 06/02/2019 05/26/2019 05/19/2019  WBC 4.0 - 10.5 K/uL 6.3 5.8 7.7  Hemoglobin 12.0 - 15.0 g/dL 11.0(L) 11.3(L) 11.0(L)  Hematocrit 36.0 -  46.0 % 32.2(L) 33.3(L) 31.8(L)  Platelets 150 - 400 K/uL 412(H) 372 358     CMP Latest Ref Rng & Units 06/02/2019 05/26/2019 05/19/2019  Glucose 70 - 99 mg/dL 120(H) 130(H) 103(H)  BUN 6 - 20 mg/dL _0 Creatinine 0.44 - 1.00 mg/dL 0.73 0.73 0.68  Sodium 135 - 145 mmol/L 140 139 139  Potassium 3.5 - 5.1 mmol/L 3.8 4.0 3.8  Chloride 98 - 111 mmol/L 104 103 105  CO2 22 - 32 mmol/L _1 Calcium 8.9 - 10.3 mg/dL 8.6(L) 8.9 8.8(L)  Total Protein 6.5 - 8.1 g/dL 6.6 7.0 6.8  Total Bilirubin 0.3 - 1.2 mg/dL 0.2(L) 0.2(L) 0.3  Alkaline Phos 38 - 126 U/L 63 69 76  AST 15 - 41 U/L _2 ALT 0 - 44 U/L 45(H) 53(H) 35      RADIOGRAPHIC STUDIES: I have personally reviewed the radiological images as listed and agreed with the findings in the report. No results found.   ASSESSMENT & PLAN:  Chelsea Hobbs is a 48 y.o. 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 She underwent left breastlumpectomyand SLNB on 04/04/19. Her DCIS margins were close (35m).  -Her breast MRI from 03/31/19 showed known left breast cancer and 1.537menhancing right breast mass. Her right breast biopsy from 04/11/19 showed small intraductal papilloma which is benign.Dr. CoBrantley Stageecommendedlumpectomy to remove this in the future.  -Will discuss her option of b/l mastectomy or right lumpectomy after chemo treatment.  -Given her 1.3cm tumor, node negative disease I started her on weekly Taxoland anti-Her2 Herceptinfor 12 weeks on 05/12/19.This will be followed byHerceptinmaintenanceq3weeks to complete 1 year of treatment. -S/p week 3 she is tolerating Taxol and transtuzumab well with mild episodes of upset stomach without diarrhea or nausea. I will call in Prilosec today to use after chemo and as needed.  -Labs reviewed with stable anemia and adequate to proceed with week 4 Taxol and Transtuzumab today.  -Continue therapy weekly. I encouraged her to use ice bags during infusion to reduce risk of neuropathy.  -She is fine to continue her vitamin supplements.  -f/u in 2 weeks    2. Bone Health -Per pt when she had her IUD removed in 03/2019. Her repeated hormonal labs show she is not postmenopausal. She still has periods. Will retest in 1 year and hold on baseline DEXA scan for now.    3. Genetic Testingwas negative for pathogenetic nutations  4. Anxiety  -Previously on Xanax and Effexor.  -Managed by her PCP -She will f/u with SW office as needed about her finances and stress.   5. Colitis  -She has colitis and uses Mesalamine Suppositories. Will monitor on chemo.  -no diarrhea lately   PLAN: -I called in Prilosec to take as needed.  -Labs reviewed and adequate to proceed with week 4 Taxol and Transtuzumab today and continue weekly  -f/u withevery 2 weeks    No problem-specific Assessment & Plan notes found for this encounter.    No orders of the defined types were placed in this encounter.  All questions were answered. The patient knows to call the clinic with any problems, questions or concerns. No barriers to learning was detected. I spent 15 minutes counseling the patient face to face. The total time spent in the appointment was 20 minutes and more than 50% was on counseling and review of test  results     Truitt Merle, MD 06/02/2019   I, Joslyn Devon, am acting as scribe for Truitt Merle, MD.   I have reviewed the above documentation for accuracy and completeness, and I agree with the above.

## 2019-06-02 ENCOUNTER — Ambulatory Visit: Payer: BC Managed Care – PPO | Admitting: Hematology

## 2019-06-02 ENCOUNTER — Other Ambulatory Visit: Payer: Self-pay

## 2019-06-02 ENCOUNTER — Encounter: Payer: Self-pay | Admitting: Hematology

## 2019-06-02 ENCOUNTER — Inpatient Hospital Stay: Payer: BC Managed Care – PPO

## 2019-06-02 ENCOUNTER — Ambulatory Visit: Payer: BC Managed Care – PPO

## 2019-06-02 ENCOUNTER — Other Ambulatory Visit: Payer: BC Managed Care – PPO

## 2019-06-02 ENCOUNTER — Inpatient Hospital Stay (HOSPITAL_BASED_OUTPATIENT_CLINIC_OR_DEPARTMENT_OTHER): Payer: BC Managed Care – PPO | Admitting: Hematology

## 2019-06-02 VITALS — BP 133/83 | HR 75 | Temp 98.3°F | Resp 18 | Ht 62.0 in | Wt 125.4 lb

## 2019-06-02 DIAGNOSIS — D649 Anemia, unspecified: Secondary | ICD-10-CM | POA: Diagnosis not present

## 2019-06-02 DIAGNOSIS — Z170 Estrogen receptor positive status [ER+]: Secondary | ICD-10-CM

## 2019-06-02 DIAGNOSIS — C50212 Malignant neoplasm of upper-inner quadrant of left female breast: Secondary | ICD-10-CM

## 2019-06-02 DIAGNOSIS — Z79899 Other long term (current) drug therapy: Secondary | ICD-10-CM | POA: Diagnosis not present

## 2019-06-02 DIAGNOSIS — Z95828 Presence of other vascular implants and grafts: Secondary | ICD-10-CM

## 2019-06-02 DIAGNOSIS — F419 Anxiety disorder, unspecified: Secondary | ICD-10-CM | POA: Diagnosis not present

## 2019-06-02 DIAGNOSIS — K529 Noninfective gastroenteritis and colitis, unspecified: Secondary | ICD-10-CM | POA: Diagnosis not present

## 2019-06-02 DIAGNOSIS — Z5111 Encounter for antineoplastic chemotherapy: Secondary | ICD-10-CM | POA: Diagnosis not present

## 2019-06-02 LAB — CMP (CANCER CENTER ONLY)
ALT: 45 U/L — ABNORMAL HIGH (ref 0–44)
AST: 28 U/L (ref 15–41)
Albumin: 4.1 g/dL (ref 3.5–5.0)
Alkaline Phosphatase: 63 U/L (ref 38–126)
Anion gap: 10 (ref 5–15)
BUN: 12 mg/dL (ref 6–20)
CO2: 26 mmol/L (ref 22–32)
Calcium: 8.6 mg/dL — ABNORMAL LOW (ref 8.9–10.3)
Chloride: 104 mmol/L (ref 98–111)
Creatinine: 0.73 mg/dL (ref 0.44–1.00)
GFR, Est AFR Am: >60 mL/min (ref >60)
GFR, Estimated: >60 mL/min (ref >60)
Glucose, Bld: 120 mg/dL — ABNORMAL HIGH (ref 70–99)
Potassium: 3.8 mmol/L (ref 3.5–5.1)
Sodium: 140 mmol/L (ref 135–145)
Total Bilirubin: 0.2 mg/dL — ABNORMAL LOW (ref 0.3–1.2)
Total Protein: 6.6 g/dL (ref 6.5–8.1)

## 2019-06-02 LAB — CBC WITH DIFFERENTIAL (CANCER CENTER ONLY)
Abs Immature Granulocytes: 0.06 10*3/uL (ref 0.00–0.07)
Basophils Absolute: 0.1 10*3/uL (ref 0.0–0.1)
Basophils Relative: 1 %
Eosinophils Absolute: 0.2 10*3/uL (ref 0.0–0.5)
Eosinophils Relative: 2 %
HCT: 32.2 % — ABNORMAL LOW (ref 36.0–46.0)
Hemoglobin: 11 g/dL — ABNORMAL LOW (ref 12.0–15.0)
Immature Granulocytes: 1 %
Lymphocytes Relative: 47 %
Lymphs Abs: 2.9 10*3/uL (ref 0.7–4.0)
MCH: 31.6 pg (ref 26.0–34.0)
MCHC: 34.2 g/dL (ref 30.0–36.0)
MCV: 92.5 fL (ref 80.0–100.0)
Monocytes Absolute: 0.4 10*3/uL (ref 0.1–1.0)
Monocytes Relative: 6 %
Neutro Abs: 2.7 10*3/uL (ref 1.7–7.7)
Neutrophils Relative %: 43 %
Platelet Count: 412 10*3/uL — ABNORMAL HIGH (ref 150–400)
RBC: 3.48 MIL/uL — ABNORMAL LOW (ref 3.87–5.11)
RDW: 12.8 % (ref 11.5–15.5)
WBC Count: 6.3 10*3/uL (ref 4.0–10.5)
nRBC: 0 % (ref 0.0–0.2)

## 2019-06-02 MED ORDER — DIPHENHYDRAMINE HCL 50 MG/ML IJ SOLN
INTRAMUSCULAR | Status: AC
  Filled 2019-06-02: qty 1

## 2019-06-02 MED ORDER — TRASTUZUMAB-DKST CHEMO 150 MG IV SOLR
2.0000 mg/kg | Freq: Once | INTRAVENOUS | Status: AC
  Administered 2019-06-02: 105 mg via INTRAVENOUS
  Filled 2019-06-02: qty 5

## 2019-06-02 MED ORDER — DEXAMETHASONE SODIUM PHOSPHATE 10 MG/ML IJ SOLN
INTRAMUSCULAR | Status: AC
  Filled 2019-06-02: qty 1

## 2019-06-02 MED ORDER — ACETAMINOPHEN 325 MG PO TABS
ORAL_TABLET | ORAL | Status: AC
  Filled 2019-06-02: qty 2

## 2019-06-02 MED ORDER — DIPHENHYDRAMINE HCL 50 MG/ML IJ SOLN
50.0000 mg | Freq: Once | INTRAMUSCULAR | Status: AC
  Administered 2019-06-02: 50 mg via INTRAVENOUS

## 2019-06-02 MED ORDER — OMEPRAZOLE 20 MG PO CPDR: 20.0000 mg | DELAYED_RELEASE_CAPSULE | Freq: Every day | ORAL | 2 refills | Status: DC

## 2019-06-02 MED ORDER — DEXAMETHASONE SODIUM PHOSPHATE 10 MG/ML IJ SOLN
10.0000 mg | Freq: Once | INTRAMUSCULAR | Status: AC
  Administered 2019-06-02: 10 mg via INTRAVENOUS

## 2019-06-02 MED ORDER — FAMOTIDINE IN NACL 20-0.9 MG/50ML-% IV SOLN
20.0000 mg | Freq: Once | INTRAVENOUS | Status: AC
  Administered 2019-06-02: 20 mg via INTRAVENOUS

## 2019-06-02 MED ORDER — HEPARIN SOD (PORK) LOCK FLUSH 100 UNIT/ML IV SOLN
500.0000 [IU] | Freq: Once | INTRAVENOUS | Status: AC | PRN
  Administered 2019-06-02: 500 [IU]
  Filled 2019-06-02: qty 5

## 2019-06-02 MED ORDER — FAMOTIDINE IN NACL 20-0.9 MG/50ML-% IV SOLN
INTRAVENOUS | Status: AC
  Filled 2019-06-02: qty 50

## 2019-06-02 MED ORDER — SODIUM CHLORIDE 0.9% FLUSH
10.0000 mL | INTRAVENOUS | Status: DC | PRN
  Administered 2019-06-02: 10 mL
  Filled 2019-06-02: qty 10

## 2019-06-02 MED ORDER — SODIUM CHLORIDE 0.9% FLUSH
10.0000 mL | INTRAVENOUS | Status: DC | PRN
  Administered 2019-06-02: 13:00:00 10 mL
  Filled 2019-06-02: qty 10

## 2019-06-02 MED ORDER — SODIUM CHLORIDE 0.9 % IV SOLN
10.0000 mg | Freq: Once | INTRAVENOUS | Status: DC
  Filled 2019-06-02: qty 1

## 2019-06-02 MED ORDER — SODIUM CHLORIDE 0.9 % IV SOLN
Freq: Once | INTRAVENOUS | Status: AC
  Administered 2019-06-02: 14:00:00 via INTRAVENOUS
  Filled 2019-06-02: qty 250

## 2019-06-02 MED ORDER — SODIUM CHLORIDE 0.9 % IV SOLN
80.0000 mg/m2 | Freq: Once | INTRAVENOUS | Status: AC
  Administered 2019-06-02: 126 mg via INTRAVENOUS
  Filled 2019-06-02: qty 21

## 2019-06-02 MED ORDER — ACETAMINOPHEN 325 MG PO TABS
650.0000 mg | ORAL_TABLET | Freq: Once | ORAL | Status: AC
  Administered 2019-06-02: 650 mg via ORAL

## 2019-06-02 NOTE — Patient Instructions (Signed)
[facility] Discharge Instructions for Patients Receiving Chemotherapy  Today you received the following chemotherapy agents: Trastuzumab, Taxol  To help prevent nausea and vomiting after your treatment, we encourage you to take your nausea medication as directed.   If you develop nausea and vomiting that is not controlled by your nausea medication, call the clinic.   BELOW ARE SYMPTOMS THAT SHOULD BE REPORTED IMMEDIATELY:  *FEVER GREATER THAN 100.5 F  *CHILLS WITH OR WITHOUT FEVER  NAUSEA AND VOMITING THAT IS NOT CONTROLLED WITH YOUR NAUSEA MEDICATION  *UNUSUAL SHORTNESS OF BREATH  *UNUSUAL BRUISING OR BLEEDING  TENDERNESS IN MOUTH AND THROAT WITH OR WITHOUT PRESENCE OF ULCERS  *URINARY PROBLEMS  *BOWEL PROBLEMS  UNUSUAL RASH Items with * indicate a potential emergency and should be followed up as soon as possible.  Feel free to call the clinic should you have any questions or concerns. The clinic phone number is [phone].  Please show the CHEMO ALERT CARD at check-in to the Emergency Department and triage nurse.   

## 2019-06-05 ENCOUNTER — Telehealth: Payer: Self-pay | Admitting: Hematology

## 2019-06-05 NOTE — Telephone Encounter (Signed)
No los per 11/20  °

## 2019-06-09 ENCOUNTER — Other Ambulatory Visit: Payer: BC Managed Care – PPO

## 2019-06-09 ENCOUNTER — Ambulatory Visit: Payer: BC Managed Care – PPO

## 2019-06-09 ENCOUNTER — Inpatient Hospital Stay: Payer: BC Managed Care – PPO

## 2019-06-09 ENCOUNTER — Other Ambulatory Visit: Payer: Self-pay

## 2019-06-09 VITALS — BP 119/81 | HR 80 | Temp 98.0°F | Resp 18

## 2019-06-09 DIAGNOSIS — C50212 Malignant neoplasm of upper-inner quadrant of left female breast: Secondary | ICD-10-CM

## 2019-06-09 DIAGNOSIS — Z170 Estrogen receptor positive status [ER+]: Secondary | ICD-10-CM

## 2019-06-09 DIAGNOSIS — Z95828 Presence of other vascular implants and grafts: Secondary | ICD-10-CM

## 2019-06-09 DIAGNOSIS — K529 Noninfective gastroenteritis and colitis, unspecified: Secondary | ICD-10-CM | POA: Diagnosis not present

## 2019-06-09 DIAGNOSIS — D649 Anemia, unspecified: Secondary | ICD-10-CM | POA: Diagnosis not present

## 2019-06-09 DIAGNOSIS — Z5111 Encounter for antineoplastic chemotherapy: Secondary | ICD-10-CM | POA: Diagnosis not present

## 2019-06-09 DIAGNOSIS — F419 Anxiety disorder, unspecified: Secondary | ICD-10-CM | POA: Diagnosis not present

## 2019-06-09 DIAGNOSIS — Z79899 Other long term (current) drug therapy: Secondary | ICD-10-CM | POA: Diagnosis not present

## 2019-06-09 LAB — CMP (CANCER CENTER ONLY)
ALT: 38 U/L (ref 0–44)
AST: 25 U/L (ref 15–41)
Albumin: 4 g/dL (ref 3.5–5.0)
Alkaline Phosphatase: 61 U/L (ref 38–126)
Anion gap: 12 (ref 5–15)
BUN: 16 mg/dL (ref 6–20)
CO2: 23 mmol/L (ref 22–32)
Calcium: 8.7 mg/dL — ABNORMAL LOW (ref 8.9–10.3)
Chloride: 105 mmol/L (ref 98–111)
Creatinine: 0.69 mg/dL (ref 0.44–1.00)
GFR, Est AFR Am: >60 mL/min (ref >60)
GFR, Estimated: >60 mL/min (ref >60)
Glucose, Bld: 115 mg/dL — ABNORMAL HIGH (ref 70–99)
Potassium: 3.8 mmol/L (ref 3.5–5.1)
Sodium: 140 mmol/L (ref 135–145)
Total Bilirubin: <0.2 mg/dL — ABNORMAL LOW (ref 0.3–1.2)
Total Protein: 6.7 g/dL (ref 6.5–8.1)

## 2019-06-09 LAB — CBC WITH DIFFERENTIAL (CANCER CENTER ONLY)
Abs Immature Granulocytes: 0.15 10*3/uL — ABNORMAL HIGH (ref 0.00–0.07)
Basophils Absolute: 0.1 10*3/uL (ref 0.0–0.1)
Basophils Relative: 2 %
Eosinophils Absolute: 0.1 10*3/uL (ref 0.0–0.5)
Eosinophils Relative: 2 %
HCT: 31.5 % — ABNORMAL LOW (ref 36.0–46.0)
Hemoglobin: 10.7 g/dL — ABNORMAL LOW (ref 12.0–15.0)
Immature Granulocytes: 2 %
Lymphocytes Relative: 37 %
Lymphs Abs: 2.8 10*3/uL (ref 0.7–4.0)
MCH: 31.7 pg (ref 26.0–34.0)
MCHC: 34 g/dL (ref 30.0–36.0)
MCV: 93.2 fL (ref 80.0–100.0)
Monocytes Absolute: 0.6 10*3/uL (ref 0.1–1.0)
Monocytes Relative: 8 %
Neutro Abs: 3.8 10*3/uL (ref 1.7–7.7)
Neutrophils Relative %: 49 %
Platelet Count: 380 10*3/uL (ref 150–400)
RBC: 3.38 MIL/uL — ABNORMAL LOW (ref 3.87–5.11)
RDW: 13.2 % (ref 11.5–15.5)
WBC Count: 7.5 10*3/uL (ref 4.0–10.5)
nRBC: 0 % (ref 0.0–0.2)

## 2019-06-09 MED ORDER — FAMOTIDINE IN NACL 20-0.9 MG/50ML-% IV SOLN
20.0000 mg | Freq: Once | INTRAVENOUS | Status: AC
  Administered 2019-06-09: 20 mg via INTRAVENOUS

## 2019-06-09 MED ORDER — DIPHENHYDRAMINE HCL 50 MG/ML IJ SOLN
50.0000 mg | Freq: Once | INTRAMUSCULAR | Status: AC
  Administered 2019-06-09: 50 mg via INTRAVENOUS

## 2019-06-09 MED ORDER — ACETAMINOPHEN 325 MG PO TABS
650.0000 mg | ORAL_TABLET | Freq: Once | ORAL | Status: AC
  Administered 2019-06-09: 650 mg via ORAL

## 2019-06-09 MED ORDER — ACETAMINOPHEN 325 MG PO TABS
ORAL_TABLET | ORAL | Status: AC
  Filled 2019-06-09: qty 2

## 2019-06-09 MED ORDER — SODIUM CHLORIDE 0.9% FLUSH
10.0000 mL | INTRAVENOUS | Status: DC | PRN
  Administered 2019-06-09: 10 mL
  Filled 2019-06-09: qty 10

## 2019-06-09 MED ORDER — SODIUM CHLORIDE 0.9 % IV SOLN
80.0000 mg/m2 | Freq: Once | INTRAVENOUS | Status: AC
  Administered 2019-06-09: 17:00:00 126 mg via INTRAVENOUS
  Filled 2019-06-09: qty 21

## 2019-06-09 MED ORDER — DEXAMETHASONE SODIUM PHOSPHATE 10 MG/ML IJ SOLN
10.0000 mg | Freq: Once | INTRAMUSCULAR | Status: AC
  Administered 2019-06-09: 10 mg via INTRAVENOUS

## 2019-06-09 MED ORDER — TRASTUZUMAB-DKST CHEMO 150 MG IV SOLR
2.0000 mg/kg | Freq: Once | INTRAVENOUS | Status: AC
  Administered 2019-06-09: 105 mg via INTRAVENOUS
  Filled 2019-06-09: qty 5

## 2019-06-09 MED ORDER — HEPARIN SOD (PORK) LOCK FLUSH 100 UNIT/ML IV SOLN
500.0000 [IU] | Freq: Once | INTRAVENOUS | Status: AC | PRN
  Administered 2019-06-09: 500 [IU]
  Filled 2019-06-09: qty 5

## 2019-06-09 MED ORDER — DEXAMETHASONE SODIUM PHOSPHATE 10 MG/ML IJ SOLN
INTRAMUSCULAR | Status: AC
  Filled 2019-06-09: qty 1

## 2019-06-09 MED ORDER — SODIUM CHLORIDE 0.9 % IV SOLN
Freq: Once | INTRAVENOUS | Status: AC
  Administered 2019-06-09: 14:00:00 via INTRAVENOUS
  Filled 2019-06-09: qty 250

## 2019-06-09 MED ORDER — FAMOTIDINE IN NACL 20-0.9 MG/50ML-% IV SOLN
INTRAVENOUS | Status: AC
  Filled 2019-06-09: qty 50

## 2019-06-09 MED ORDER — DIPHENHYDRAMINE HCL 50 MG/ML IJ SOLN
INTRAMUSCULAR | Status: AC
  Filled 2019-06-09: qty 1

## 2019-06-09 NOTE — Patient Instructions (Signed)
[facility] Discharge Instructions for Patients Receiving Chemotherapy  Today you received the following chemotherapy agents Trastuzumab (OGIVRI) & Paclitaxel (TAXOL).  To help prevent nausea and vomiting after your treatment, we encourage you to take your nausea medication as prescribed.   If you develop nausea and vomiting that is not controlled by your nausea medication, call the clinic.   BELOW ARE SYMPTOMS THAT SHOULD BE REPORTED IMMEDIATELY:  *FEVER GREATER THAN 100.5 F  *CHILLS WITH OR WITHOUT FEVER  NAUSEA AND VOMITING THAT IS NOT CONTROLLED WITH YOUR NAUSEA MEDICATION  *UNUSUAL SHORTNESS OF BREATH  *UNUSUAL BRUISING OR BLEEDING  TENDERNESS IN MOUTH AND THROAT WITH OR WITHOUT PRESENCE OF ULCERS  *URINARY PROBLEMS  *BOWEL PROBLEMS  UNUSUAL RASH Items with * indicate a potential emergency and should be followed up as soon as possible.  Feel free to call the clinic should you have any questions or concerns. The clinic phone number is [phone].  Please show the CHEMO ALERT CARD at check-in to the Emergency Department and triage nurse.  Coronavirus (COVID-19) Are you at risk?  Are you at risk for the Coronavirus (COVID-19)?  To be considered HIGH RISK for Coronavirus (COVID-19), you have to meet the following criteria:  . Traveled to [place], [place], [place], [place] or [place]; or in the [place] to [place], [place], [place], or [place]; and have fever, cough, and shortness of breath within the last 2 weeks of travel OR . Been in close contact with a person diagnosed with COVID-19 within the last 2 weeks and have fever, cough, and shortness of breath . IF YOU DO NOT MEET THESE CRITERIA, YOU ARE CONSIDERED LOW RISK FOR COVID-19.  What to do if you are HIGH RISK for COVID-19?  . If you are having a medical emergency, call 911. . Seek medical care right away. Before you go to a doctor's office, urgent care or emergency department, call  ahead and tell them about your recent travel, contact with someone diagnosed with COVID-19, and your symptoms. You should receive instructions from your physician's office regarding next steps of care.  . When you arrive at healthcare provider, tell the healthcare staff immediately you have returned from visiting [place], [place], [place], [place] or [place]; or traveled in the [place] to [place], [place], [place], or [place]; in the last two weeks or you have been in close contact with a person diagnosed with COVID-19 in the last 2 weeks.   . Tell the health care staff about your symptoms: fever, cough and shortness of breath. . After you have been seen by a medical provider, you will be either: o Tested for (COVID-19) and discharged home on quarantine except to seek medical care if symptoms worsen, and asked to  - Stay home and avoid contact with others until you get your results (4-5 days)  - Avoid travel on public transportation if possible (such as bus, train, or airplane) or o Sent to the Emergency Department by EMS for evaluation, COVID-19 testing, and possible admission depending on your condition and test results.  What to do if you are LOW RISK for COVID-19?  Reduce your risk of any infection by using the same precautions used for avoiding the common cold or flu:  . Wash your hands often with soap and warm water for at least 20 seconds.  If soap and water are not readily available, use an alcohol-based hand sanitizer with at least 60% alcohol.  .   If coughing or sneezing, cover your mouth and nose by coughing or sneezing into the elbow areas of your shirt or coat, into a tissue or into your sleeve (not your hands). . Avoid shaking hands with others and consider head nods or verbal greetings only. . Avoid touching your eyes, nose, or mouth with unwashed hands.  . Avoid close contact with people who are sick. . Avoid places or events with large numbers of people in one location,  like concerts or sporting events. . Carefully consider travel plans you have or are making. . If you are planning any travel outside or inside the [place], visit the CDC's Travelers' Health webpage for the latest health notices. . If you have some symptoms but not all symptoms, continue to monitor at home and seek medical attention if your symptoms worsen. . If you are having a medical emergency, call 911.   [place] / e-Visit: [email or web address]         [facility]: [facility]: [phone]                   [facility]: [phone]

## 2019-06-12 ENCOUNTER — Telehealth: Payer: Self-pay

## 2019-06-12 ENCOUNTER — Telehealth: Payer: Self-pay | Admitting: Emergency Medicine

## 2019-06-12 DIAGNOSIS — C50212 Malignant neoplasm of upper-inner quadrant of left female breast: Secondary | ICD-10-CM | POA: Diagnosis not present

## 2019-06-12 DIAGNOSIS — Z803 Family history of malignant neoplasm of breast: Secondary | ICD-10-CM | POA: Diagnosis not present

## 2019-06-12 DIAGNOSIS — Z170 Estrogen receptor positive status [ER+]: Secondary | ICD-10-CM | POA: Diagnosis not present

## 2019-06-12 NOTE — Telephone Encounter (Signed)
VM from pt on triage/[facility] line requesting a f/u from phone call she placed this am with MD Feng's desk about dry/bloody nose.  RN Malachy Mood aware and is in the process of calling pt back with advice from MD Burr Medico.  No further action required at this time.

## 2019-06-12 NOTE — Telephone Encounter (Signed)
Patient calls stating that she has developed a dry, crusty, bloody nose for several days, no fevers.  She has been using saline nose spray and vaseline but doesn't seem to be helping.   [phone]

## 2019-06-12 NOTE — Telephone Encounter (Signed)
I called her back, she denies fever, chills, or significant nasal discharge.  She has sinus congestion. I recommended her to try allergy medicine and Flonase, she agrees to try.  Need for chemo and office visit this Friday.  Truitt Merle MD

## 2019-06-15 NOTE — Progress Notes (Signed)
[place]   Telephone:(336) [phone] Fax:(336) [phone]   Clinic Follow up Note   Patient Care Team: Wenda Low, MD as PCP - General (Internal Medicine) Rockwell Germany, RN as Oncology Nurse Navigator Mauro Kaufmann, RN as Oncology Nurse Navigator Erroll Luna, MD as Consulting Physician (General Surgery) Truitt Merle, MD as Consulting Physician (Hematology) Gery Pray, MD as Consulting Physician (Radiation Oncology)  Date of Service:  06/16/2019  CHIEF COMPLAINT:  F/u of left breast cancer  SUMMARY OF ONCOLOGIC HISTORY: Oncology History Overview Note  Cancer Staging Malignant neoplasm of upper-inner quadrant of left breast in female, estrogen receptor positive ([place]) Staging form: Breast, AJCC 8th Edition - Clinical stage from 03/22/2019: Stage IA (cT1c, cN0, cM0, G3, ER+, PR+, HER2+) - Signed by Truitt Merle, MD on 03/22/2019    Malignant neoplasm of upper-inner quadrant of left breast in female, estrogen receptor positive ([place])  03/10/2019 Mammogram   Diagnostic Mammogram 03/10/19  IMPRESSION: 1. Suspicious mass in the 10 o'clock position of the left breast, 3cm from the nipple measuring 12x7x82m. Biopsy is indicated. 2. Indeterminate mass in the 5:30 o'clock position of the left breast, 5cm from the nipple, measuring 9x6x760m Although this may reflect a fibroadenoma, tissue sampling is recommended.   03/16/2019 Initial Biopsy   Diagnosis 03/16/19  1. 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 SEE NOTE 2. Breast, left, needle core biopsy, 5:30 o'clock - FIBROADENOMA   03/16/2019 Receptors her2   Results: IMMUNOHISTOCHEMICAL AND MORPHOMETRIC ANALYSIS PERFORMED MANUALLY The tumor cells are POSITIVE for Her2 (3+). Estrogen Receptor: 100%, POSITIVE, STRONG STAINING INTENSITY Progesterone Receptor: 100%, POSITIVE, STRONG STAINING INTENSITY Proliferation Marker Ki67: 60%   03/21/2019 Initial Diagnosis   Malignant  neoplasm of upper-inner quadrant of left breast in female, estrogen receptor positive ([place]  03/22/2019 Cancer Staging   Staging form: Breast, AJCC 8th Edition - Clinical stage from 03/22/2019: Stage IA (cT1c, cN0, cM0, G3, ER+, PR+, HER2+) - Signed by FeTruitt MerleMD on 03/22/2019   03/31/2019 Genetic Testing   VUS in CTNNA1 called [id number] (Intronic) was identified on the [facility] Common Hereditary Cancers Panel. The Common Hereditary Cancers Panel offered by [facility] includes sequencing and/or deletion duplication testing of the following 47 genes: APC, ATM, AXIN2, BARD1, BMPR1A, BRCA1, BRCA2, BRIP1, CDH1, CDKN2A (p14ARF), CDKN2A (p16INK4a), CKD4, CHEK2, CTNNA1, DICER1, EPCAM (Deletion/duplication testing only), GREM1 (promoter region deletion/duplication testing only), KIT, MEN1, MLH1, MSH2, MSH3, MSH6, MUTYH, NBN, NF1, NHTL1, PALB2, PDGFRA, PMS2, POLD1, POLE, PTEN, RAD50, RAD51C, RAD51D, SDHB, SDHC, SDHD, SMAD4, SMARCA4. STK11, TP53, TSC1, TSC2, and VHL.  The following genes were evaluated for sequence changes only: SDHA and HOXB13 c.251G>A variant only. The report date is 03/30/2019.    03/31/2019 Breast MRI   MRI breast 03/31/19  IMPRESSION: 1. 5 millimeter enhancing oval mass in the anteromedial aspect of the RIGHT breast warranting further evaluation. Given its anterior depth and visibility on noncontrast images, ultrasound is recommended to determine if there is a sonographic correlate. If no sonographic correlate is identified to guide biopsy, MRI biopsy of the RIGHT breast is recommended. 2. Known malignancy in the UPPER INNER QUADRANT of the LEFT breast measuring 1.0 centimeters. 3. 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   04/04/2019 Surgery   RADIOCATIVE SEED GUIDED LEFT BREAST LUMPECTOMY, LEFT AXILLARY SENTINEL LYMPH NODE BIOPSY and PAC placement  By Dr. CoBrantley Stage9/22/20    04/04/2019 Pathology Results   DIAGNOSIS: 04/04/19  A.  BREAST, LEFT,  LUMPECTOMY:  -  Invasive ductal carcinoma, Nottingham grade 3 of 3, 1.3 cm  -  Ductal carcinoma in-situ, intermediate grade  -  Margins uninvolved by carcinoma (0.1 cm; posterior margin; see  comment)  -  Previous biopsy site changes present  -  See oncology table and comment below   B. BREAST, LEFT, ADDITIONAL MEDIAL MARGIN, EXCISION:  -  Fibrocystic changes  -  No residual carcinoma identified   C. LYMPH NODE, LEFT AXILLARY, SENTINEL, BIOPSY:  -   No carcinoma identified in one lymph node (0/1)    04/04/2019 Cancer Staging   Staging form: Breast, AJCC 8th Edition - Pathologic stage from 04/04/2019: Stage IA (pT1c, pN0, cM0, G3, ER+, PR+, HER2+) - Signed by Truitt Merle, MD on 04/21/2019   04/11/2019 Pathology Results   Diagnosis Breast, right, needle core biopsy, 2 o'clock - SMALL INTRADUCTAL PAPILLOMA. SEE NOTE - SMALL FIBROADENOMA - NEGATIVE FOR CARCINOMA   05/12/2019 -  Chemotherapy   adjuvant Weekly Taxol and Herceptin for 12 weeks starting 05/12/19. Followed by maintenance Herceptin q3weeks to complete 1 year treatment.       CURRENT THERAPY:  adjuvant Weekly Taxol and Herceptin for 12 weeksstarting 05/12/19. Followed by maintenance Herceptin q3weeks to complete 1 year treatment.  INTERVAL HISTORY:  Chelsea Hobbs is here for a follow up and treatment. She presents to the clinic alone. She notes her sinus bleeding has mostly resolved with Flonase and allergy medication. She uses Vasaline on her nose as well. She notes itching of bumps on her scalp after shaving her head. She denies any other rash. She notes her appetite has remained adequate. She denies neuropathy as she continues to use ice baths during infusion.     REVIEW OF SYSTEMS:   Constitutional: Denies fevers, chills or abnormal weight loss Eyes: Denies blurriness of vision Ears, nose, mouth, throat, and face: Denies mucositis or sore throat Respiratory: Denies cough, dyspnea or wheezes Cardiovascular:  Denies palpitation, chest discomfort or lower extremity swelling Gastrointestinal:  Denies nausea, heartburn or change in bowel habits Skin: (+) skin rash of scalp  Lymphatics: Denies new lymphadenopathy or easy bruising Neurological:Denies numbness, tingling or new weaknesses Behavioral/Psych: Mood is stable, no new changes  All other systems were reviewed with the patient and are negative.  MEDICAL HISTORY:  Past Medical History:  Diagnosis Date  . Asthma   . Family history of breast cancer   . Family history of lung cancer     SURGICAL HISTORY: Past Surgical History:  Procedure Laterality Date  . BREAST BIOPSY Right   . BREAST LUMPECTOMY WITH RADIOACTIVE SEED AND SENTINEL LYMPH NODE BIOPSY Left 04/04/2019   Procedure: RADIOCATIVE SEED GUIDED LEFT BREAST LUMPECTOMY, LEFT AXILLARY SENTINEL LYMPH NODE BIOPSY;  Surgeon: Erroll Luna, MD;  Location: [facility];  Service: General;  Laterality: Left;  . NASAL SINUS SURGERY    . PORTACATH PLACEMENT N/A 04/04/2019   Procedure: INSERTION PORT-A-CATH WITH ULTRASOUND;  Surgeon: Erroll Luna, MD;  Location: [facility];  Service: General;  Laterality: N/A;    I have reviewed the social history and family history with the patient and they are unchanged from previous note.  ALLERGIES:  is allergic to doxycycline and latex.  MEDICATIONS:  Current Outpatient Medications  Medication Sig Dispense Refill  . albuterol (PROVENTIL HFA;VENTOLIN HFA) 108 (90 BASE) MCG/ACT inhaler Inhale 2 puffs into the lungs every 6 (six) hours as needed. For shortness of breath    . ALPRAZolam Duanne Moron)  1 MG tablet Take 0.5 mg by mouth 2 (two) times daily as needed.    . Ascorbic Acid (VITAMIN C) 1000 MG tablet Take 1,000 mg by mouth daily.    . cholecalciferol (VITAMIN D3) 25 MCG (1000 UT) tablet Take 2,000 Units by mouth daily.     . fexofenadine (ALLEGRA) 60 MG tablet Take 60 mg by mouth 2 (two) times daily.    . fluticasone  (FLONASE) 50 MCG/ACT nasal spray Place into both nostrils daily.    Marland Kitchen lidocaine-prilocaine (EMLA) cream Apply to affected area once 30 g 3  . omeprazole (PRILOSEC) 20 MG capsule Take 1 capsule (20 mg total) by mouth daily. 30 capsule 2  . ondansetron (ZOFRAN) 8 MG tablet Take 1 tablet (8 mg total) by mouth 2 (two) times daily as needed (Nausea or vomiting). 30 tablet 1  . oxyCODONE (OXY IR/ROXICODONE) 5 MG immediate release tablet Take 1 tablet (5 mg total) by mouth every 6 (six) hours as needed for severe pain. 15 tablet 0  . prochlorperazine (COMPAZINE) 10 MG tablet Take 1 tablet (10 mg total) by mouth every 6 (six) hours as needed (Nausea or vomiting). 30 tablet 1  . SUMAtriptan (IMITREX) 100 MG tablet Take 100 mg by mouth.     No current facility-administered medications for this visit.     PHYSICAL EXAMINATION: ECOG PERFORMANCE STATUS: 1 - Symptomatic but completely ambulatory  Vitals:   06/16/19 1356  BP: 129/90  Pulse: 81  Resp: 18  Temp: 97.8 F (36.6 C)  SpO2: 100%   Filed Weights   06/16/19 1356  Weight: 123 lb 1.6 oz (55.8 kg)    GENERAL:alert, no distress and comfortable SKIN: skin color, texture, turgor are normal (+) diffuse small papular skin rash on scalp EYES: normal, Conjunctiva are pink and non-injected, sclera clear  NECK: supple, thyroid normal size, non-tender, without nodularity LYMPH:  no palpable lymphadenopathy in the cervical, axillary  LUNGS: clear to auscultation and percussion with normal breathing effort HEART: regular rate & rhythm and no murmurs and no lower extremity edema ABDOMEN:abdomen soft, non-tender and normal bowel sounds Musculoskeletal:no cyanosis of digits and no clubbing  NEURO: alert & oriented x 3 with fluent speech, no focal motor/sensory deficits  LABORATORY DATA:  I have reviewed the data as listed CBC Latest Ref Rng & Units 06/16/2019 06/09/2019 06/02/2019  WBC 4.0 - 10.5 K/uL 6.8 7.5 6.3  Hemoglobin 12.0 - 15.0 g/dL 11.4(L)  10.7(L) 11.0(L)  Hematocrit 36.0 - 46.0 % 33.4(L) 31.5(L) 32.2(L)  Platelets 150 - 400 K/uL 411(H) 380 412(H)     CMP Latest Ref Rng & Units 06/16/2019 06/09/2019 06/02/2019  Glucose 70 - 99 mg/dL 97 115(H) 120(H)  BUN 6 - 20 mg/dL '18 16 12  ' Creatinine 0.44 - 1.00 mg/dL 0.68 0.69 0.73  Sodium 135 - 145 mmol/L 140 140 140  Potassium 3.5 - 5.1 mmol/L 4.0 3.8 3.8  Chloride 98 - 111 mmol/L 102 105 104  CO2 22 - 32 mmol/L '30 23 26  ' Calcium 8.9 - 10.3 mg/dL 9.5 8.7(L) 8.6(L)  Total Protein 6.5 - 8.1 g/dL 7.0 6.7 6.6  Total Bilirubin 0.3 - 1.2 mg/dL 0.3 <0.2(L) 0.2(L)  Alkaline Phos 38 - 126 U/L 68 61 63  AST 15 - 41 U/L 32 25 28  ALT 0 - 44 U/L 73(H) 38 45(H)      RADIOGRAPHIC STUDIES: I have personally reviewed the radiological images as listed and agreed with the findings in the report. No results found.  ASSESSMENT & PLAN:  Rakesha Dalporto Hobbs is a 48 y.o. 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 She underwent left breastlumpectomyand SLNB on 04/04/19.Her DCIS margins were close (9m). -Her breast MRI from 03/31/19 showed known left breast cancer and 1.51menhancing right breast mass. Her right breast biopsy from 04/11/19 showed small intraductal papilloma which is benign.Dr. CoBrantley Stageecommendedlumpectomy to remove this in the future. -Will discuss her option of b/l mastectomy or right lumpectomy after chemo treatment. -Given her 1.3cm tumor, node negative disease Istarted her on weekly Taxoland anti-Her2 Herceptinfor 12 weekson 05/12/19.This will be followed byHerceptinmaintenanceq3weeks to complete 1 year of treatment. -S/p week 5 she tolerated well with mild skin rash on scalp. This is likely mild reaction to Taxol. I reviewed managed with her. Overall tolerable.  -Labs reviewed, stable and adequate to proceed with week 6 Taxol and transtuzumab today.   -F/u in 2 weeks   2. Bone Health -Per pt when she had her IUD removedin 03/2019. Her repeated hormonal labs show she is not postmenopausal. She still has periods. Will retest in 1 year and hold on baseline DEXA scan for now.   3. Genetic Testingwas negative for pathogenetic nutations  4. Anxiety  -Previously on Xanaxand Effexor. -Managed by her PCP -She will f/u with SW office as needed about her finances and stress.  5. Colitis  -She has colitis and uses Mesalamine Suppositories. Will monitor on chemo. -no diarrhea lately   6. Skin rash  -Likely secondary to Taxol  -On scalp, head is shaved clean  -I recommend Claritin and OTC hydrocortisone cream and continue to use lotion.   PLAN: -Labs reviewed and adequate to proceed with week 4 Taxol and Transtuzumabtoday and continue weekly -f/u withevery 2 weeks  No problem-specific Assessment & Plan notes found for this encounter.   No orders of the defined types were placed in this encounter.  All questions were answered. The patient knows to call the clinic with any problems, questions or concerns. No barriers to learning was detected. I spent 15 minutes counseling the patient face to face. The total time spent in the appointment was 20 minutes and more than 50% was on counseling and review of test results     YaTruitt MerleMD 06/16/2019   I, AmJoslyn Devonam acting as scribe for YaTruitt MerleMD.   I have reviewed the above documentation for accuracy and completeness, and I agree with the above.

## 2019-06-16 ENCOUNTER — Other Ambulatory Visit: Payer: BC Managed Care – PPO

## 2019-06-16 ENCOUNTER — Inpatient Hospital Stay: Payer: BC Managed Care – PPO

## 2019-06-16 ENCOUNTER — Ambulatory Visit: Payer: BC Managed Care – PPO | Admitting: Hematology

## 2019-06-16 ENCOUNTER — Inpatient Hospital Stay (HOSPITAL_BASED_OUTPATIENT_CLINIC_OR_DEPARTMENT_OTHER): Payer: BC Managed Care – PPO | Admitting: Hematology

## 2019-06-16 ENCOUNTER — Ambulatory Visit: Payer: BC Managed Care – PPO

## 2019-06-16 ENCOUNTER — Other Ambulatory Visit: Payer: Self-pay

## 2019-06-16 ENCOUNTER — Encounter: Payer: Self-pay | Admitting: Hematology

## 2019-06-16 ENCOUNTER — Inpatient Hospital Stay: Payer: BC Managed Care – PPO | Attending: Hematology

## 2019-06-16 VITALS — BP 129/90 | HR 81 | Temp 97.8°F | Resp 18 | Ht 62.0 in | Wt 123.1 lb

## 2019-06-16 DIAGNOSIS — C50212 Malignant neoplasm of upper-inner quadrant of left female breast: Secondary | ICD-10-CM

## 2019-06-16 DIAGNOSIS — Z170 Estrogen receptor positive status [ER+]: Secondary | ICD-10-CM

## 2019-06-16 DIAGNOSIS — R21 Rash and other nonspecific skin eruption: Secondary | ICD-10-CM | POA: Insufficient documentation

## 2019-06-16 DIAGNOSIS — Z5111 Encounter for antineoplastic chemotherapy: Secondary | ICD-10-CM | POA: Insufficient documentation

## 2019-06-16 DIAGNOSIS — F419 Anxiety disorder, unspecified: Secondary | ICD-10-CM | POA: Diagnosis not present

## 2019-06-16 DIAGNOSIS — K529 Noninfective gastroenteritis and colitis, unspecified: Secondary | ICD-10-CM | POA: Insufficient documentation

## 2019-06-16 DIAGNOSIS — Z95828 Presence of other vascular implants and grafts: Secondary | ICD-10-CM

## 2019-06-16 DIAGNOSIS — Z79899 Other long term (current) drug therapy: Secondary | ICD-10-CM | POA: Diagnosis not present

## 2019-06-16 LAB — CBC WITH DIFFERENTIAL (CANCER CENTER ONLY)
Abs Immature Granulocytes: 0.08 10*3/uL — ABNORMAL HIGH (ref 0.00–0.07)
Basophils Absolute: 0.1 10*3/uL (ref 0.0–0.1)
Basophils Relative: 1 %
Eosinophils Absolute: 0.1 10*3/uL (ref 0.0–0.5)
Eosinophils Relative: 2 %
HCT: 33.4 % — ABNORMAL LOW (ref 36.0–46.0)
Hemoglobin: 11.4 g/dL — ABNORMAL LOW (ref 12.0–15.0)
Immature Granulocytes: 1 %
Lymphocytes Relative: 38 %
Lymphs Abs: 2.6 10*3/uL (ref 0.7–4.0)
MCH: 31.5 pg (ref 26.0–34.0)
MCHC: 34.1 g/dL (ref 30.0–36.0)
MCV: 92.3 fL (ref 80.0–100.0)
Monocytes Absolute: 0.5 10*3/uL (ref 0.1–1.0)
Monocytes Relative: 7 %
Neutro Abs: 3.5 10*3/uL (ref 1.7–7.7)
Neutrophils Relative %: 51 %
Platelet Count: 411 10*3/uL — ABNORMAL HIGH (ref 150–400)
RBC: 3.62 MIL/uL — ABNORMAL LOW (ref 3.87–5.11)
RDW: 13.2 % (ref 11.5–15.5)
WBC Count: 6.8 10*3/uL (ref 4.0–10.5)
nRBC: 0 % (ref 0.0–0.2)

## 2019-06-16 LAB — CMP (CANCER CENTER ONLY)
ALT: 73 U/L — ABNORMAL HIGH (ref 0–44)
AST: 32 U/L (ref 15–41)
Albumin: 4.2 g/dL (ref 3.5–5.0)
Alkaline Phosphatase: 68 U/L (ref 38–126)
Anion gap: 8 (ref 5–15)
BUN: 18 mg/dL (ref 6–20)
CO2: 30 mmol/L (ref 22–32)
Calcium: 9.5 mg/dL (ref 8.9–10.3)
Chloride: 102 mmol/L (ref 98–111)
Creatinine: 0.68 mg/dL (ref 0.44–1.00)
GFR, Est AFR Am: >60 mL/min (ref >60)
GFR, Estimated: >60 mL/min (ref >60)
Glucose, Bld: 97 mg/dL (ref 70–99)
Potassium: 4 mmol/L (ref 3.5–5.1)
Sodium: 140 mmol/L (ref 135–145)
Total Bilirubin: 0.3 mg/dL (ref 0.3–1.2)
Total Protein: 7 g/dL (ref 6.5–8.1)

## 2019-06-16 MED ORDER — FAMOTIDINE IN NACL 20-0.9 MG/50ML-% IV SOLN
INTRAVENOUS | Status: AC
  Filled 2019-06-16: qty 50

## 2019-06-16 MED ORDER — SODIUM CHLORIDE 0.9 % IV SOLN
80.0000 mg/m2 | Freq: Once | INTRAVENOUS | Status: AC
  Administered 2019-06-16: 126 mg via INTRAVENOUS
  Filled 2019-06-16: qty 21

## 2019-06-16 MED ORDER — ACETAMINOPHEN 325 MG PO TABS
650.0000 mg | ORAL_TABLET | Freq: Once | ORAL | Status: AC
  Administered 2019-06-16: 650 mg via ORAL

## 2019-06-16 MED ORDER — FAMOTIDINE IN NACL 20-0.9 MG/50ML-% IV SOLN
20.0000 mg | Freq: Once | INTRAVENOUS | Status: AC
  Administered 2019-06-16: 20 mg via INTRAVENOUS

## 2019-06-16 MED ORDER — DIPHENHYDRAMINE HCL 50 MG/ML IJ SOLN
INTRAMUSCULAR | Status: AC
  Filled 2019-06-16: qty 1

## 2019-06-16 MED ORDER — DEXAMETHASONE SODIUM PHOSPHATE 10 MG/ML IJ SOLN
10.0000 mg | Freq: Once | INTRAMUSCULAR | Status: AC
  Administered 2019-06-16: 10 mg via INTRAVENOUS

## 2019-06-16 MED ORDER — DIPHENHYDRAMINE HCL 50 MG/ML IJ SOLN
50.0000 mg | Freq: Once | INTRAMUSCULAR | Status: AC
  Administered 2019-06-16: 50 mg via INTRAVENOUS

## 2019-06-16 MED ORDER — SODIUM CHLORIDE 0.9 % IV SOLN
Freq: Once | INTRAVENOUS | Status: AC
  Administered 2019-06-16: 14:00:00 via INTRAVENOUS
  Filled 2019-06-16: qty 250

## 2019-06-16 MED ORDER — SODIUM CHLORIDE 0.9% FLUSH
10.0000 mL | INTRAVENOUS | Status: DC | PRN
  Administered 2019-06-16: 10 mL
  Filled 2019-06-16: qty 10

## 2019-06-16 MED ORDER — HEPARIN SOD (PORK) LOCK FLUSH 100 UNIT/ML IV SOLN
500.0000 [IU] | Freq: Once | INTRAVENOUS | Status: AC | PRN
  Administered 2019-06-16: 500 [IU]
  Filled 2019-06-16: qty 5

## 2019-06-16 MED ORDER — TRASTUZUMAB-DKST CHEMO 150 MG IV SOLR
2.0000 mg/kg | Freq: Once | INTRAVENOUS | Status: AC
  Administered 2019-06-16: 105 mg via INTRAVENOUS
  Filled 2019-06-16: qty 5

## 2019-06-16 MED ORDER — DEXAMETHASONE SODIUM PHOSPHATE 10 MG/ML IJ SOLN
INTRAMUSCULAR | Status: AC
  Filled 2019-06-16: qty 1

## 2019-06-16 MED ORDER — ACETAMINOPHEN 325 MG PO TABS
ORAL_TABLET | ORAL | Status: AC
  Filled 2019-06-16: qty 2

## 2019-06-16 NOTE — Patient Instructions (Signed)
[facility] Discharge Instructions for Patients Receiving Chemotherapy  Today you received the following chemotherapy agents: Trastuzumab-dkst ([place]) and Paclitaxel (Taxol)  To help prevent nausea and vomiting after your treatment, we encourage you to take your nausea medication as directed.    If you develop nausea and vomiting that is not controlled by your nausea medication, call the clinic.   BELOW ARE SYMPTOMS THAT SHOULD BE REPORTED IMMEDIATELY:  *FEVER GREATER THAN 100.5 F  *CHILLS WITH OR WITHOUT FEVER  NAUSEA AND VOMITING THAT IS NOT CONTROLLED WITH YOUR NAUSEA MEDICATION  *UNUSUAL SHORTNESS OF BREATH  *UNUSUAL BRUISING OR BLEEDING  TENDERNESS IN MOUTH AND THROAT WITH OR WITHOUT PRESENCE OF ULCERS  *URINARY PROBLEMS  *BOWEL PROBLEMS  UNUSUAL RASH Items with * indicate a potential emergency and should be followed up as soon as possible.  Feel free to call the clinic should you have any questions or concerns. The clinic phone number is [phone].  Please show the [place] [place] [facility] at check-in to the Emergency Department and triage nurse.  Coronavirus (COVID-19) Are you at risk?  Are you at risk for the Coronavirus (COVID-19)?  To be considered HIGH RISK for Coronavirus (COVID-19), you have to meet the following criteria:  . Traveled to [place], [place], [place], [place] or [place]; or in the [place] to [place], [place], [place], or [place]; and have fever, cough, and shortness of breath within the last 2 weeks of travel OR . Been in close contact with a person diagnosed with COVID-19 within the last 2 weeks and have fever, cough, and shortness of breath . IF YOU DO NOT MEET THESE CRITERIA, YOU ARE CONSIDERED LOW RISK FOR COVID-19.  What to do if you are HIGH RISK for COVID-19?  Marland Kitchen If you are having a medical emergency, call 911. . Seek medical care right away. Before you go to a doctor's office, urgent care or emergency department,  call ahead and tell them about your recent travel, contact with someone diagnosed with COVID-19, and your symptoms. You should receive instructions from your physician's office regarding next steps of care.  . When you arrive at healthcare provider, tell the healthcare staff immediately you have returned from visiting [place], [place], [place], [place] or [place]; or traveled in the [place] to [place], [place], [place], or [place]; in the last two weeks or you have been in close contact with a person diagnosed with COVID-19 in the last 2 weeks.   . Tell the health care staff about your symptoms: fever, cough and shortness of breath. . After you have been seen by a medical provider, you will be either: o Tested for (COVID-19) and discharged home on quarantine except to seek medical care if symptoms worsen, and asked to  - Stay home and avoid contact with others until you get your results (4-5 days)  - Avoid travel on public transportation if possible (such as bus, train, or airplane) or o Sent to the Emergency Department by EMS for evaluation, COVID-19 testing, and possible admission depending on your condition and test results.  What to do if you are LOW RISK for COVID-19?  Reduce your risk of any infection by using the same precautions used for avoiding the common cold or flu:  Marland Kitchen Wash your hands often with soap and warm water for at least 20 seconds.  If soap and water are not readily available, use an alcohol-based hand sanitizer with at least 60% alcohol.  Marland Kitchen  If coughing or sneezing, cover your mouth and nose by coughing or sneezing into the elbow areas of your shirt or coat, into a tissue or into your sleeve (not your hands). . Avoid shaking hands with others and consider head nods or verbal greetings only. . Avoid touching your eyes, nose, or mouth with unwashed hands.  . Avoid close contact with people who are sick. . Avoid places or events with large numbers of people in one  location, like concerts or sporting events. . Carefully consider travel plans you have or are making. . If you are planning any travel outside or inside the [place], visit the CDC's Travelers' Health webpage for the latest health notices. . If you have some symptoms but not all symptoms, continue to monitor at home and seek medical attention if your symptoms worsen. . If you are having a medical emergency, call 911.   ADDITIONAL HEALTHCARE OPTIONS FOR PATIENTS  [facility] / e-Visit: [email or web address]         [facility]: [phone]  [facility]: [phone]                   [facility]: [phone]   

## 2019-06-16 NOTE — Patient Instructions (Signed)

## 2019-06-17 ENCOUNTER — Encounter: Payer: Self-pay | Admitting: Hematology

## 2019-06-19 ENCOUNTER — Telehealth: Payer: Self-pay | Admitting: Hematology

## 2019-06-19 NOTE — Telephone Encounter (Signed)
No los per 12/4. °

## 2019-06-23 ENCOUNTER — Other Ambulatory Visit: Payer: Self-pay | Admitting: Hematology

## 2019-06-23 ENCOUNTER — Inpatient Hospital Stay: Payer: BC Managed Care – PPO

## 2019-06-23 ENCOUNTER — Other Ambulatory Visit: Payer: BC Managed Care – PPO

## 2019-06-23 ENCOUNTER — Other Ambulatory Visit: Payer: Self-pay

## 2019-06-23 ENCOUNTER — Ambulatory Visit: Payer: BC Managed Care – PPO

## 2019-06-23 VITALS — BP 111/77 | HR 88 | Temp 98.2°F | Resp 18

## 2019-06-23 DIAGNOSIS — R21 Rash and other nonspecific skin eruption: Secondary | ICD-10-CM | POA: Diagnosis not present

## 2019-06-23 DIAGNOSIS — C50212 Malignant neoplasm of upper-inner quadrant of left female breast: Secondary | ICD-10-CM | POA: Diagnosis not present

## 2019-06-23 DIAGNOSIS — Z170 Estrogen receptor positive status [ER+]: Secondary | ICD-10-CM

## 2019-06-23 DIAGNOSIS — F419 Anxiety disorder, unspecified: Secondary | ICD-10-CM | POA: Diagnosis not present

## 2019-06-23 DIAGNOSIS — Z5111 Encounter for antineoplastic chemotherapy: Secondary | ICD-10-CM | POA: Diagnosis not present

## 2019-06-23 DIAGNOSIS — K529 Noninfective gastroenteritis and colitis, unspecified: Secondary | ICD-10-CM | POA: Diagnosis not present

## 2019-06-23 DIAGNOSIS — Z95828 Presence of other vascular implants and grafts: Secondary | ICD-10-CM

## 2019-06-23 DIAGNOSIS — Z79899 Other long term (current) drug therapy: Secondary | ICD-10-CM | POA: Diagnosis not present

## 2019-06-23 LAB — CBC WITH DIFFERENTIAL (CANCER CENTER ONLY)
Abs Immature Granulocytes: 0.06 10*3/uL (ref 0.00–0.07)
Basophils Absolute: 0.1 10*3/uL (ref 0.0–0.1)
Basophils Relative: 1 %
Eosinophils Absolute: 0.2 10*3/uL (ref 0.0–0.5)
Eosinophils Relative: 3 %
HCT: 30.7 % — ABNORMAL LOW (ref 36.0–46.0)
Hemoglobin: 10.4 g/dL — ABNORMAL LOW (ref 12.0–15.0)
Immature Granulocytes: 1 %
Lymphocytes Relative: 38 %
Lymphs Abs: 2.5 10*3/uL (ref 0.7–4.0)
MCH: 31 pg (ref 26.0–34.0)
MCHC: 33.9 g/dL (ref 30.0–36.0)
MCV: 91.4 fL (ref 80.0–100.0)
Monocytes Absolute: 0.6 10*3/uL (ref 0.1–1.0)
Monocytes Relative: 9 %
Neutro Abs: 3.3 10*3/uL (ref 1.7–7.7)
Neutrophils Relative %: 48 %
Platelet Count: 402 10*3/uL — ABNORMAL HIGH (ref 150–400)
RBC: 3.36 MIL/uL — ABNORMAL LOW (ref 3.87–5.11)
RDW: 13.5 % (ref 11.5–15.5)
WBC Count: 6.7 10*3/uL (ref 4.0–10.5)
nRBC: 0 % (ref 0.0–0.2)

## 2019-06-23 LAB — CMP (CANCER CENTER ONLY)
ALT: 87 U/L — ABNORMAL HIGH (ref 0–44)
AST: 39 U/L (ref 15–41)
Albumin: 3.9 g/dL (ref 3.5–5.0)
Alkaline Phosphatase: 73 U/L (ref 38–126)
Anion gap: 8 (ref 5–15)
BUN: 11 mg/dL (ref 6–20)
CO2: 28 mmol/L (ref 22–32)
Calcium: 8.9 mg/dL (ref 8.9–10.3)
Chloride: 105 mmol/L (ref 98–111)
Creatinine: 0.69 mg/dL (ref 0.44–1.00)
GFR, Est AFR Am: >60 mL/min (ref >60)
GFR, Estimated: >60 mL/min (ref >60)
Glucose, Bld: 100 mg/dL — ABNORMAL HIGH (ref 70–99)
Potassium: 3.8 mmol/L (ref 3.5–5.1)
Sodium: 141 mmol/L (ref 135–145)
Total Bilirubin: 0.3 mg/dL (ref 0.3–1.2)
Total Protein: 6.8 g/dL (ref 6.5–8.1)

## 2019-06-23 MED ORDER — FAMOTIDINE IN NACL 20-0.9 MG/50ML-% IV SOLN
INTRAVENOUS | Status: AC
  Filled 2019-06-23: qty 50

## 2019-06-23 MED ORDER — SODIUM CHLORIDE 0.9 % IV SOLN
Freq: Once | INTRAVENOUS | Status: AC
  Administered 2019-06-23: 15:00:00 via INTRAVENOUS
  Filled 2019-06-23: qty 250

## 2019-06-23 MED ORDER — DIPHENHYDRAMINE HCL 50 MG/ML IJ SOLN
50.0000 mg | Freq: Once | INTRAMUSCULAR | Status: AC
  Administered 2019-06-23: 50 mg via INTRAVENOUS

## 2019-06-23 MED ORDER — SODIUM CHLORIDE 0.9% FLUSH
10.0000 mL | INTRAVENOUS | Status: DC | PRN
  Administered 2019-06-23: 10 mL
  Filled 2019-06-23: qty 10

## 2019-06-23 MED ORDER — TRASTUZUMAB-DKST CHEMO 150 MG IV SOLR
2.0000 mg/kg | Freq: Once | INTRAVENOUS | Status: AC
  Administered 2019-06-23: 105 mg via INTRAVENOUS
  Filled 2019-06-23: qty 5

## 2019-06-23 MED ORDER — FAMOTIDINE IN NACL 20-0.9 MG/50ML-% IV SOLN
20.0000 mg | Freq: Once | INTRAVENOUS | Status: AC
  Administered 2019-06-23: 20 mg via INTRAVENOUS

## 2019-06-23 MED ORDER — ACETAMINOPHEN 325 MG PO TABS
650.0000 mg | ORAL_TABLET | Freq: Once | ORAL | Status: AC
  Administered 2019-06-23: 650 mg via ORAL

## 2019-06-23 MED ORDER — DIPHENHYDRAMINE HCL 50 MG/ML IJ SOLN
INTRAMUSCULAR | Status: AC
  Filled 2019-06-23: qty 1

## 2019-06-23 MED ORDER — CLINDAMYCIN PHOS-BENZOYL PEROX 1-5 % EX GEL: Freq: Two times a day (BID) | CUTANEOUS | 0 refills | Status: DC

## 2019-06-23 MED ORDER — DEXAMETHASONE SODIUM PHOSPHATE 10 MG/ML IJ SOLN
10.0000 mg | Freq: Once | INTRAMUSCULAR | Status: AC
  Administered 2019-06-23: 10 mg via INTRAVENOUS

## 2019-06-23 MED ORDER — SODIUM CHLORIDE 0.9 % IV SOLN
80.0000 mg/m2 | Freq: Once | INTRAVENOUS | Status: AC
  Administered 2019-06-23: 126 mg via INTRAVENOUS
  Filled 2019-06-23: qty 21

## 2019-06-23 MED ORDER — HEPARIN SOD (PORK) LOCK FLUSH 100 UNIT/ML IV SOLN
500.0000 [IU] | Freq: Once | INTRAVENOUS | Status: AC | PRN
  Administered 2019-06-23: 500 [IU]
  Filled 2019-06-23: qty 5

## 2019-06-23 MED ORDER — DEXAMETHASONE SODIUM PHOSPHATE 10 MG/ML IJ SOLN
INTRAMUSCULAR | Status: AC
  Filled 2019-06-23: qty 1

## 2019-06-23 MED ORDER — ACETAMINOPHEN 325 MG PO TABS
ORAL_TABLET | ORAL | Status: AC
  Filled 2019-06-23: qty 2

## 2019-06-23 NOTE — Patient Instructions (Signed)
[facility] Discharge Instructions for Patients Receiving Chemotherapy  Today you received the following chemotherapy agents :  Trastuzumab,  Paclitaxel.  To help prevent nausea and vomiting after your treatment, we encourage you to take your nausea medication as prescribed.   If you develop nausea and vomiting that is not controlled by your nausea medication, call the clinic.   BELOW ARE SYMPTOMS THAT SHOULD BE REPORTED IMMEDIATELY:  *FEVER GREATER THAN 100.5 F  *CHILLS WITH OR WITHOUT FEVER  NAUSEA AND VOMITING THAT IS NOT CONTROLLED WITH YOUR NAUSEA MEDICATION  *UNUSUAL SHORTNESS OF BREATH  *UNUSUAL BRUISING OR BLEEDING  TENDERNESS IN MOUTH AND THROAT WITH OR WITHOUT PRESENCE OF ULCERS  *URINARY PROBLEMS  *BOWEL PROBLEMS  UNUSUAL RASH Items with * indicate a potential emergency and should be followed up as soon as possible.  Feel free to call the clinic should you have any questions or concerns. The clinic phone number is [phone].  Please show the [facility] at check-in to the Emergency Department and triage nurse.

## 2019-06-23 NOTE — Progress Notes (Signed)
Per Dr. Burr Medico, [place] to treat with ALT  87 today.

## 2019-06-23 NOTE — Patient Instructions (Signed)

## 2019-06-29 NOTE — Progress Notes (Signed)
[place]   [phone] [phone]   Clinic Follow up Note   Patient Care Team: Wenda Low, MD as PCP - General (Internal Medicine) Rockwell Germany, RN as Oncology Nurse Navigator Mauro Kaufmann, RN as Oncology Nurse Navigator Erroll Luna, MD as Consulting Physician (General Surgery) Truitt Merle, MD as Consulting Physician (Hematology) Gery Pray, MD as Consulting Physician (Radiation Oncology)  Date of Service:  06/30/2019  CHIEF COMPLAINT: F/u of left breast cancer  SUMMARY OF ONCOLOGIC HISTORY: Oncology History Overview Note  Cancer Staging Malignant neoplasm of upper-inner quadrant of left breast in female, estrogen receptor positive ([place]) Staging form: Breast, AJCC 8th Edition - Clinical stage from 03/22/2019: Stage IA (cT1c, cN0, cM0, G3, ER+, PR+, HER2+) - Signed by Truitt Merle, MD on 03/22/2019    Malignant neoplasm of upper-inner quadrant of left breast in female, estrogen receptor positive ([place])  03/10/2019 Mammogram   Diagnostic Mammogram 03/10/19  IMPRESSION: 1. Suspicious mass in the 10 o'clock position of the left breast, 3cm from the nipple measuring 12x7x31m. Biopsy is indicated. 2. Indeterminate mass in the 5:30 o'clock position of the left breast, 5cm from the nipple, measuring 9x6x735m Although this may reflect a fibroadenoma, tissue sampling is recommended.   03/16/2019 Initial Biopsy   Diagnosis 03/16/19  1. 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 SEE NOTE 2. Breast, left, needle core biopsy, 5:30 o'clock - FIBROADENOMA   03/16/2019 Receptors her2   Results: IMMUNOHISTOCHEMICAL AND MORPHOMETRIC ANALYSIS PERFORMED MANUALLY The tumor cells are POSITIVE for Her2 (3+). Estrogen Receptor: 100%, POSITIVE, STRONG STAINING INTENSITY Progesterone Receptor: 100%, POSITIVE, STRONG STAINING INTENSITY Proliferation Marker Ki67: 60%   03/21/2019 Initial Diagnosis   Malignant  neoplasm of upper-inner quadrant of left breast in [place], [place] positive [place]  03/22/2019 Cancer Staging   Staging form: Breast, AJCC 8th Edition - Clinical stage from 03/22/2019: Stage IA (cT1c, cN0, cM0, G3, ER+, PR+, HER2+) - Signed by FeTruitt MerleMD on 03/22/2019   03/31/2019 Genetic Testing   VUS in CTNNA1 called c.[id number]>T (Intronic) was identified on the [facility] Common Hereditary Cancers Panel. The Common Hereditary Cancers Panel offered by [facility] includes sequencing and/or deletion duplication testing of the following 47 genes: APC, ATM, AXIN2, BARD1, BMPR1A, BRCA1, BRCA2, BRIP1, CDH1, CDKN2A (p14ARF), CDKN2A (p16INK4a), CKD4, CHEK2, CTNNA1, DICER1, EPCAM (Deletion/duplication testing only), GREM1 (promoter region deletion/duplication testing only), KIT, MEN1, MLH1, MSH2, MSH3, MSH6, MUTYH, NBN, NF1, NHTL1, PALB2, PDGFRA, PMS2, POLD1, POLE, PTEN, RAD50, RAD51C, RAD51D, SDHB, SDHC, SDHD, SMAD4, SMARCA4. STK11, TP53, TSC1, TSC2, and VHL.  The following genes were evaluated for sequence changes only: SDHA and HOXB13 c.251G>A variant only. The report date is 03/30/2019.    03/31/2019 Breast MRI   MRI breast 03/31/19  IMPRESSION: 1. 5 millimeter enhancing oval mass in the anteromedial aspect of the RIGHT breast warranting further evaluation. Given its anterior depth and visibility on noncontrast images, ultrasound is recommended to determine if there is a sonographic correlate. If no sonographic correlate is identified to guide biopsy, MRI biopsy of the RIGHT breast is recommended. 2. Known malignancy in the UPPER INNER QUADRANT of the LEFT breast measuring 1.0 centimeters. 3. 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   04/04/2019 Surgery   RADIOCATIVE SEED GUIDED LEFT BREAST LUMPECTOMY, LEFT AXILLARY SENTINEL LYMPH NODE BIOPSY and PAC placement  By Dr. CoBrantley Stage9/22/20    04/04/2019 Pathology Results   DIAGNOSIS: 04/04/19  A. BREAST,  LEFT,  LUMPECTOMY:  -  Invasive ductal carcinoma, Nottingham grade 3 of 3, 1.3 cm  -  Ductal carcinoma in-situ, intermediate grade  -  Margins uninvolved by carcinoma (0.1 cm; posterior margin; see  comment)  -  Previous biopsy site changes present  -  See oncology table and comment below   B. BREAST, LEFT, ADDITIONAL MEDIAL MARGIN, EXCISION:  -  Fibrocystic changes  -  No residual carcinoma identified   C. LYMPH NODE, LEFT AXILLARY, SENTINEL, BIOPSY:  -   No carcinoma identified in one lymph node (0/1)    04/04/2019 Cancer Staging   Staging form: Breast, AJCC 8th Edition - Pathologic stage from 04/04/2019: Stage IA (pT1c, pN0, cM0, G3, ER+, PR+, HER2+) - Signed by Truitt Merle, MD on 04/21/2019   04/11/2019 Pathology Results   Diagnosis Breast, right, needle core biopsy, 2 o'clock - SMALL INTRADUCTAL PAPILLOMA. SEE NOTE - SMALL FIBROADENOMA - NEGATIVE FOR CARCINOMA   05/12/2019 -  Chemotherapy   adjuvant Weekly Taxol and Herceptin for 12 weeks starting 05/12/19. Followed by maintenance Herceptin q3weeks to complete 1 year treatment.       CURRENT THERAPY:  adjuvant Weekly Taxol and Herceptin for 12 weeksstarting 05/12/19. Followed by maintenance Herceptin q3weeks to complete 1 year treatment.  INTERVAL HISTORY:  Chelsea Hobbs is here for a follow up and treatment. She presents to the clinic alone. She notes her scalp rash has improved. She uses Clindamycin cream on this. She denies any neuropathy. She continues to use ice bags during infusion. She uses saline flush in nose and some blood in discharge. She notes having upset stomach this morning, no significant diarrhea. She notes only being nauseous once based on the food she ate. She still notes trouble sleeping.  She notes she wears a lot of PPE at work.    REVIEW OF SYSTEMS:   Constitutional: Denies fevers, chills or abnormal weight loss (+) Trouble sleeping  Eyes: Denies blurriness of vision Ears, nose, mouth, throat, and  face: Denies mucositis or sore throat Respiratory: Denies cough, dyspnea or wheezes Cardiovascular: Denies palpitation, chest discomfort or lower extremity swelling Gastrointestinal:  Denies nausea, heartburn or change in bowel habits Skin: (+) Rash of scalp, chest and back improved.  Lymphatics: Denies new lymphadenopathy or easy bruising Neurological:Denies numbness, tingling or new weaknesses Behavioral/Psych: Mood is stable, no new changes  All other systems were reviewed with the patient and are negative.  MEDICAL HISTORY:  Past Medical History:  Diagnosis Date  . Asthma   . Family history of breast cancer   . Family history of lung cancer     SURGICAL HISTORY: Past Surgical History:  Procedure Laterality Date  . BREAST BIOPSY Right   . BREAST LUMPECTOMY WITH RADIOACTIVE SEED AND SENTINEL LYMPH NODE BIOPSY Left 04/04/2019   Procedure: RADIOCATIVE SEED GUIDED LEFT BREAST LUMPECTOMY, LEFT AXILLARY SENTINEL LYMPH NODE BIOPSY;  Surgeon: Erroll Luna, MD;  Location: [place];  Service: General;  Laterality: Left;  . NASAL SINUS SURGERY    . PORTACATH PLACEMENT N/A 04/04/2019   Procedure: INSERTION PORT-A-CATH WITH ULTRASOUND;  Surgeon: Erroll Luna, MD;  Location: [place];  Service: General;  Laterality: N/A;    I have reviewed the social history and family history with the patient and they are unchanged from previous note.  ALLERGIES:  is allergic to doxycycline and latex.  MEDICATIONS:  Current Outpatient Medications  Medication Sig Dispense Refill  . albuterol (PROVENTIL HFA;VENTOLIN HFA) 108 (90 BASE) MCG/ACT inhaler  Inhale 2 puffs into the lungs every 6 (six) hours as needed. For shortness of breath    . ALPRAZolam (XANAX) 1 MG tablet Take 0.5 mg by mouth 2 (two) times daily as needed.    . Ascorbic Acid (VITAMIN C) 1000 MG tablet Take 1,000 mg by mouth daily.    . cholecalciferol (VITAMIN D3) 25 MCG (1000 UT) tablet Take 2,000 Units  by mouth daily.     . clindamycin-benzoyl peroxide (BENZACLIN) gel Apply topically 2 (two) times daily. 25 g 0  . fexofenadine (ALLEGRA) 60 MG tablet Take 60 mg by mouth 2 (two) times daily.    . fluticasone (FLONASE) 50 MCG/ACT nasal spray Place into both nostrils daily.    Marland Kitchen lidocaine-prilocaine (EMLA) cream Apply to affected area once 30 g 3  . omeprazole (PRILOSEC) 20 MG capsule Take 1 capsule (20 mg total) by mouth daily. 30 capsule 2  . ondansetron (ZOFRAN) 8 MG tablet Take 1 tablet (8 mg total) by mouth 2 (two) times daily as needed (Nausea or vomiting). 30 tablet 1  . oxyCODONE (OXY IR/ROXICODONE) 5 MG immediate release tablet Take 1 tablet (5 mg total) by mouth every 6 (six) hours as needed for severe pain. 15 tablet 0  . prochlorperazine (COMPAZINE) 10 MG tablet Take 1 tablet (10 mg total) by mouth every 6 (six) hours as needed (Nausea or vomiting). 30 tablet 1  . SUMAtriptan (IMITREX) 100 MG tablet Take 100 mg by mouth.     No current facility-administered medications for this visit.   Facility-Administered Medications Ordered in Other Visits  Medication Dose Route Frequency Provider Last Rate Last Admin  . sodium chloride flush (NS) 0.9 % injection 10 mL  10 mL Intracatheter PRN Truitt Merle, MD   10 mL at 06/30/19 1242    PHYSICAL EXAMINATION: ECOG PERFORMANCE STATUS: 1 - Symptomatic but completely ambulatory  Vitals:   06/30/19 0912  BP: (!) 126/92  Pulse: 87  Resp: 18  Temp: 98.5 F (36.9 C)  SpO2: 100%   Filed Weights   06/30/19 0912  Weight: 126 lb (57.2 kg)    GENERAL:alert, no distress and comfortable SKIN: skin color, texture, turgor are normal (+) Improved skin rash of scalp EYES: normal, Conjunctiva are pink and non-injected, sclera clear  NECK: supple, thyroid normal size, non-tender, without nodularity LYMPH:  no palpable lymphadenopathy in the cervical, axillary  LUNGS: clear to auscultation and percussion with normal breathing effort HEART: regular rate  & rhythm and no murmurs and no lower extremity edema ABDOMEN:abdomen soft, non-tender and normal bowel sounds Musculoskeletal:no cyanosis of digits and no clubbing  NEURO: alert & oriented x 3 with fluent speech, no focal motor/sensory deficits  LABORATORY DATA:  I have reviewed the data as listed CBC Latest Ref Rng & Units 06/30/2019 06/23/2019 06/16/2019  WBC 4.0 - 10.5 K/uL 6.3 6.7 6.8  Hemoglobin 12.0 - 15.0 g/dL 10.7(L) 10.4(L) 11.4(L)  Hematocrit 36.0 - 46.0 % 31.5(L) 30.7(L) 33.4(L)  Platelets 150 - 400 K/uL 396 402(H) 411(H)     CMP Latest Ref Rng & Units 06/30/2019 06/23/2019 06/16/2019  Glucose 70 - 99 mg/dL 77 100(H) 97  BUN 6 - 20 mg/dL '13 11 18  ' Creatinine 0.44 - 1.00 mg/dL 0.66 0.69 0.68  Sodium 135 - 145 mmol/L 141 141 140  Potassium 3.5 - 5.1 mmol/L 4.4 3.8 4.0  Chloride 98 - 111 mmol/L 106 105 102  CO2 22 - 32 mmol/L '26 28 30  ' Calcium 8.9 - 10.3 mg/dL 8.9 8.9  9.5  Total Protein 6.5 - 8.1 g/dL 6.9 6.8 7.0  Total Bilirubin 0.3 - 1.2 mg/dL 0.3 0.3 0.3  Alkaline Phos 38 - 126 U/L 73 73 68  AST 15 - 41 U/L 37 39 32  ALT 0 - 44 U/L 80(H) 87(H) 73(H)      RADIOGRAPHIC STUDIES: I have personally reviewed the radiological images as listed and agreed with the findings in the report. No results found.   ASSESSMENT & PLAN:  Chelsea Hobbs is a 48 y.o. 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 She underwent left breastlumpectomyand SLNB on 04/04/19.Her DCIS margins were close (59m). -Her breast MRI from 03/31/19 showed known left breast cancer and 1.575menhancing right breast mass. Her right breast biopsy from 04/11/19 showed small intraductal papilloma which is benign.Dr. CoBrantley Stageecommendedlumpectomy to remove this in the future. -Will discuss her option of b/l mastectomy orrightlumpectomy after chemo treatment. -Given her 1.3cm tumor, node  negative disease Istarted her on weekly Taxoland anti-Her2 Herceptinfor 12 weekson 05/12/19.This will be followed byHerceptinmaintenanceq3weeks to complete 1 year of treatment. -S/p week 7 she tolerated well with very mild side effects and skin rash of scalp, chest, back. Has improved. Will switch Taxol to Abraxane if insurance approves. She agrees.  -Labs reviewed, stable anemia. Overall adequate to proceed with week 8 Taxol and Transtuzumab today. I encouraged her to watch for neuropathy. She will continue ice bags with infusion.  -F/u in 2 weeks    2. Bone Health -Per pt when she had her IUD removedin 03/2019. Her repeated hormonal labs show she is not postmenopausal. She still has periods. Will retest in 1 year and hold on baseline DEXA scan for now.   3. Genetic Testingwas negative for pathogenetic nutations  4. Anxiety  -Previously on Xanaxand Effexor. -Managed by her PCP -She will f/u with SW office as needed about her finances and stress.  5. Colitis  -She has colitis and uses Mesalamine Suppositories. Will monitor on chemo. -no diarrhea lately  6. Skin rash  -Likely secondary to Taxol  -On scalp, head is shaved clean, chest and back -She will continue Claritin, OTC hydrocortisone cream and clindamycin. -Will switch Taxol to Abraxane if insurance approves.   7. Insomnia  -I recommend she use Melatonin or benadryl. She is willing to try    PLAN: -Labs reviewed and adequate to proceed with week 8 Taxol andTranstuzumabtoday and continue weekly, will switch Taxol to Abraxane next week if her insurance approves -f/u withevery2 weeks   No problem-specific Assessment & Plan notes found for this encounter.   No orders of the defined types were placed in this encounter.  All questions were answered. The patient knows to call the clinic with any problems, questions or concerns. No barriers to learning was detected. I spent 20 minutes counseling  the patient face to face. The total time spent in the appointment was 25 minutes and more than 50% was on counseling and review of test results     YaTruitt MerleMD 06/30/2019   I, AmJoslyn Devonam acting as scribe for YaTruitt MerleMD.   I have reviewed the above documentation for accuracy and completeness, and I agree with the above.

## 2019-06-30 ENCOUNTER — Inpatient Hospital Stay (HOSPITAL_BASED_OUTPATIENT_CLINIC_OR_DEPARTMENT_OTHER): Payer: BC Managed Care – PPO | Admitting: Hematology

## 2019-06-30 ENCOUNTER — Other Ambulatory Visit: Payer: Self-pay

## 2019-06-30 ENCOUNTER — Inpatient Hospital Stay: Payer: BC Managed Care – PPO

## 2019-06-30 ENCOUNTER — Encounter: Payer: Self-pay | Admitting: Hematology

## 2019-06-30 VITALS — BP 126/92 | HR 87 | Temp 98.5°F | Resp 18 | Ht 62.0 in | Wt 126.0 lb

## 2019-06-30 DIAGNOSIS — C50212 Malignant neoplasm of upper-inner quadrant of left female breast: Secondary | ICD-10-CM

## 2019-06-30 DIAGNOSIS — Z170 Estrogen receptor positive status [ER+]: Secondary | ICD-10-CM

## 2019-06-30 DIAGNOSIS — K529 Noninfective gastroenteritis and colitis, unspecified: Secondary | ICD-10-CM | POA: Diagnosis not present

## 2019-06-30 DIAGNOSIS — R21 Rash and other nonspecific skin eruption: Secondary | ICD-10-CM | POA: Diagnosis not present

## 2019-06-30 DIAGNOSIS — Z95828 Presence of other vascular implants and grafts: Secondary | ICD-10-CM

## 2019-06-30 DIAGNOSIS — F419 Anxiety disorder, unspecified: Secondary | ICD-10-CM | POA: Diagnosis not present

## 2019-06-30 DIAGNOSIS — Z5111 Encounter for antineoplastic chemotherapy: Secondary | ICD-10-CM | POA: Diagnosis not present

## 2019-06-30 DIAGNOSIS — Z79899 Other long term (current) drug therapy: Secondary | ICD-10-CM | POA: Diagnosis not present

## 2019-06-30 LAB — CBC WITH DIFFERENTIAL (CANCER CENTER ONLY)
Abs Immature Granulocytes: 0.14 10*3/uL — ABNORMAL HIGH (ref 0.00–0.07)
Basophils Absolute: 0.1 10*3/uL (ref 0.0–0.1)
Basophils Relative: 2 %
Eosinophils Absolute: 0.2 10*3/uL (ref 0.0–0.5)
Eosinophils Relative: 3 %
HCT: 31.5 % — ABNORMAL LOW (ref 36.0–46.0)
Hemoglobin: 10.7 g/dL — ABNORMAL LOW (ref 12.0–15.0)
Immature Granulocytes: 2 %
Lymphocytes Relative: 34 %
Lymphs Abs: 2.2 10*3/uL (ref 0.7–4.0)
MCH: 31.1 pg (ref 26.0–34.0)
MCHC: 34 g/dL (ref 30.0–36.0)
MCV: 91.6 fL (ref 80.0–100.0)
Monocytes Absolute: 0.6 10*3/uL (ref 0.1–1.0)
Monocytes Relative: 9 %
Neutro Abs: 3.1 10*3/uL (ref 1.7–7.7)
Neutrophils Relative %: 50 %
Platelet Count: 396 10*3/uL (ref 150–400)
RBC: 3.44 MIL/uL — ABNORMAL LOW (ref 3.87–5.11)
RDW: 13.5 % (ref 11.5–15.5)
WBC Count: 6.3 10*3/uL (ref 4.0–10.5)
nRBC: 0 % (ref 0.0–0.2)

## 2019-06-30 LAB — CMP (CANCER CENTER ONLY)
ALT: 80 U/L — ABNORMAL HIGH (ref 0–44)
AST: 37 U/L (ref 15–41)
Albumin: 3.9 g/dL (ref 3.5–5.0)
Alkaline Phosphatase: 73 U/L (ref 38–126)
Anion gap: 9 (ref 5–15)
BUN: 13 mg/dL (ref 6–20)
CO2: 26 mmol/L (ref 22–32)
Calcium: 8.9 mg/dL (ref 8.9–10.3)
Chloride: 106 mmol/L (ref 98–111)
Creatinine: 0.66 mg/dL (ref 0.44–1.00)
GFR, Est AFR Am: >60 mL/min (ref >60)
GFR, Estimated: >60 mL/min (ref >60)
Glucose, Bld: 77 mg/dL (ref 70–99)
Potassium: 4.4 mmol/L (ref 3.5–5.1)
Sodium: 141 mmol/L (ref 135–145)
Total Bilirubin: 0.3 mg/dL (ref 0.3–1.2)
Total Protein: 6.9 g/dL (ref 6.5–8.1)

## 2019-06-30 MED ORDER — DIPHENHYDRAMINE HCL 50 MG/ML IJ SOLN
50.0000 mg | Freq: Once | INTRAMUSCULAR | Status: AC
  Administered 2019-06-30: 50 mg via INTRAVENOUS

## 2019-06-30 MED ORDER — ACETAMINOPHEN 325 MG PO TABS
650.0000 mg | ORAL_TABLET | Freq: Once | ORAL | Status: AC
  Administered 2019-06-30: 650 mg via ORAL

## 2019-06-30 MED ORDER — SODIUM CHLORIDE 0.9 % IV SOLN
80.0000 mg/m2 | Freq: Once | INTRAVENOUS | Status: AC
  Administered 2019-06-30: 126 mg via INTRAVENOUS
  Filled 2019-06-30: qty 21

## 2019-06-30 MED ORDER — HEPARIN SOD (PORK) LOCK FLUSH 100 UNIT/ML IV SOLN
500.0000 [IU] | Freq: Once | INTRAVENOUS | Status: AC | PRN
  Administered 2019-06-30: 500 [IU]
  Filled 2019-06-30: qty 5

## 2019-06-30 MED ORDER — TRASTUZUMAB-DKST CHEMO 150 MG IV SOLR
2.0000 mg/kg | Freq: Once | INTRAVENOUS | Status: AC
  Administered 2019-06-30: 105 mg via INTRAVENOUS
  Filled 2019-06-30: qty 5

## 2019-06-30 MED ORDER — FAMOTIDINE IN NACL 20-0.9 MG/50ML-% IV SOLN
INTRAVENOUS | Status: AC
  Filled 2019-06-30: qty 50

## 2019-06-30 MED ORDER — DIPHENHYDRAMINE HCL 50 MG/ML IJ SOLN
INTRAMUSCULAR | Status: AC
  Filled 2019-06-30: qty 1

## 2019-06-30 MED ORDER — ACETAMINOPHEN 325 MG PO TABS
ORAL_TABLET | ORAL | Status: AC
  Filled 2019-06-30: qty 2

## 2019-06-30 MED ORDER — DEXAMETHASONE SODIUM PHOSPHATE 10 MG/ML IJ SOLN
INTRAMUSCULAR | Status: AC
  Filled 2019-06-30: qty 1

## 2019-06-30 MED ORDER — SODIUM CHLORIDE 0.9% FLUSH
10.0000 mL | INTRAVENOUS | Status: DC | PRN
  Administered 2019-06-30: 10 mL
  Filled 2019-06-30: qty 10

## 2019-06-30 MED ORDER — SODIUM CHLORIDE 0.9 % IV SOLN
Freq: Once | INTRAVENOUS | Status: AC
  Filled 2019-06-30: qty 250

## 2019-06-30 MED ORDER — DEXAMETHASONE SODIUM PHOSPHATE 10 MG/ML IJ SOLN
10.0000 mg | Freq: Once | INTRAMUSCULAR | Status: AC
  Administered 2019-06-30: 10 mg via INTRAVENOUS

## 2019-06-30 MED ORDER — FAMOTIDINE IN NACL 20-0.9 MG/50ML-% IV SOLN
20.0000 mg | Freq: Once | INTRAVENOUS | Status: AC
  Administered 2019-06-30: 20 mg via INTRAVENOUS

## 2019-06-30 NOTE — Patient Instructions (Signed)
[facility] Discharge Instructions for Patients Receiving Chemotherapy  Today you received the following chemotherapy agents :  Trastuzumab,  Paclitaxel.  To help prevent nausea and vomiting after your treatment, we encourage you to take your nausea medication as prescribed.   If you develop nausea and vomiting that is not controlled by your nausea medication, call the clinic.   BELOW ARE SYMPTOMS THAT SHOULD BE REPORTED IMMEDIATELY:  *FEVER GREATER THAN 100.5 F  *CHILLS WITH OR WITHOUT FEVER  NAUSEA AND VOMITING THAT IS NOT CONTROLLED WITH YOUR NAUSEA MEDICATION  *UNUSUAL SHORTNESS OF BREATH  *UNUSUAL BRUISING OR BLEEDING  TENDERNESS IN MOUTH AND THROAT WITH OR WITHOUT PRESENCE OF ULCERS  *URINARY PROBLEMS  *BOWEL PROBLEMS  UNUSUAL RASH Items with * indicate a potential emergency and should be followed up as soon as possible.  Feel free to call the clinic should you have any questions or concerns. The clinic phone number is [phone].  Please show the [facility] at check-in to the Emergency Department and triage nurse.

## 2019-07-02 NOTE — Addendum Note (Signed)
Addended by: Truitt Merle on: 07/02/2019 10:53 PM   Modules accepted: Orders

## 2019-07-06 ENCOUNTER — Inpatient Hospital Stay: Payer: BC Managed Care – PPO

## 2019-07-06 ENCOUNTER — Other Ambulatory Visit: Payer: Self-pay

## 2019-07-06 VITALS — BP 124/88 | HR 89 | Temp 98.0°F | Resp 18 | Wt 124.8 lb

## 2019-07-06 DIAGNOSIS — Z170 Estrogen receptor positive status [ER+]: Secondary | ICD-10-CM | POA: Diagnosis not present

## 2019-07-06 DIAGNOSIS — C50212 Malignant neoplasm of upper-inner quadrant of left female breast: Secondary | ICD-10-CM

## 2019-07-06 DIAGNOSIS — Z79899 Other long term (current) drug therapy: Secondary | ICD-10-CM | POA: Diagnosis not present

## 2019-07-06 DIAGNOSIS — R21 Rash and other nonspecific skin eruption: Secondary | ICD-10-CM | POA: Diagnosis not present

## 2019-07-06 DIAGNOSIS — Z5111 Encounter for antineoplastic chemotherapy: Secondary | ICD-10-CM | POA: Diagnosis not present

## 2019-07-06 DIAGNOSIS — F419 Anxiety disorder, unspecified: Secondary | ICD-10-CM | POA: Diagnosis not present

## 2019-07-06 DIAGNOSIS — Z95828 Presence of other vascular implants and grafts: Secondary | ICD-10-CM

## 2019-07-06 DIAGNOSIS — K529 Noninfective gastroenteritis and colitis, unspecified: Secondary | ICD-10-CM | POA: Diagnosis not present

## 2019-07-06 LAB — CMP (CANCER CENTER ONLY)
ALT: 76 U/L — ABNORMAL HIGH (ref 0–44)
AST: 43 U/L — ABNORMAL HIGH (ref 15–41)
Albumin: 4 g/dL (ref 3.5–5.0)
Alkaline Phosphatase: 79 U/L (ref 38–126)
Anion gap: 8 (ref 5–15)
BUN: 16 mg/dL (ref 6–20)
CO2: 26 mmol/L (ref 22–32)
Calcium: 9 mg/dL (ref 8.9–10.3)
Chloride: 107 mmol/L (ref 98–111)
Creatinine: 0.74 mg/dL (ref 0.44–1.00)
GFR, Est AFR Am: >60 mL/min (ref >60)
GFR, Estimated: >60 mL/min (ref >60)
Glucose, Bld: 115 mg/dL — ABNORMAL HIGH (ref 70–99)
Potassium: 4.2 mmol/L (ref 3.5–5.1)
Sodium: 141 mmol/L (ref 135–145)
Total Bilirubin: 0.3 mg/dL (ref 0.3–1.2)
Total Protein: 7 g/dL (ref 6.5–8.1)

## 2019-07-06 LAB — CBC WITH DIFFERENTIAL (CANCER CENTER ONLY)
Abs Immature Granulocytes: 0.09 10*3/uL — ABNORMAL HIGH (ref 0.00–0.07)
Basophils Absolute: 0.1 10*3/uL (ref 0.0–0.1)
Basophils Relative: 2 %
Eosinophils Absolute: 0.2 10*3/uL (ref 0.0–0.5)
Eosinophils Relative: 4 %
HCT: 32.5 % — ABNORMAL LOW (ref 36.0–46.0)
Hemoglobin: 11 g/dL — ABNORMAL LOW (ref 12.0–15.0)
Immature Granulocytes: 2 %
Lymphocytes Relative: 41 %
Lymphs Abs: 2.2 10*3/uL (ref 0.7–4.0)
MCH: 31.6 pg (ref 26.0–34.0)
MCHC: 33.8 g/dL (ref 30.0–36.0)
MCV: 93.4 fL (ref 80.0–100.0)
Monocytes Absolute: 0.4 10*3/uL (ref 0.1–1.0)
Monocytes Relative: 7 %
Neutro Abs: 2.5 10*3/uL (ref 1.7–7.7)
Neutrophils Relative %: 44 %
Platelet Count: 387 10*3/uL (ref 150–400)
RBC: 3.48 MIL/uL — ABNORMAL LOW (ref 3.87–5.11)
RDW: 13.7 % (ref 11.5–15.5)
WBC Count: 5.5 10*3/uL (ref 4.0–10.5)
nRBC: 0 % (ref 0.0–0.2)

## 2019-07-06 MED ORDER — ACETAMINOPHEN 325 MG PO TABS
650.0000 mg | ORAL_TABLET | Freq: Once | ORAL | Status: AC
  Administered 2019-07-06: 650 mg via ORAL

## 2019-07-06 MED ORDER — DIPHENHYDRAMINE HCL 50 MG/ML IJ SOLN
50.0000 mg | Freq: Once | INTRAMUSCULAR | Status: AC
  Administered 2019-07-06: 50 mg via INTRAVENOUS

## 2019-07-06 MED ORDER — FAMOTIDINE IN NACL 20-0.9 MG/50ML-% IV SOLN
INTRAVENOUS | Status: AC
  Filled 2019-07-06: qty 50

## 2019-07-06 MED ORDER — HEPARIN SOD (PORK) LOCK FLUSH 100 UNIT/ML IV SOLN
500.0000 [IU] | Freq: Once | INTRAVENOUS | Status: AC | PRN
  Administered 2019-07-06: 500 [IU]
  Filled 2019-07-06: qty 5

## 2019-07-06 MED ORDER — ACETAMINOPHEN 325 MG PO TABS
ORAL_TABLET | ORAL | Status: AC
  Filled 2019-07-06: qty 2

## 2019-07-06 MED ORDER — SODIUM CHLORIDE 0.9% FLUSH
10.0000 mL | INTRAVENOUS | Status: DC | PRN
  Administered 2019-07-06: 10 mL
  Filled 2019-07-06: qty 10

## 2019-07-06 MED ORDER — TRASTUZUMAB-DKST CHEMO 150 MG IV SOLR
2.0000 mg/kg | Freq: Once | INTRAVENOUS | Status: AC
  Administered 2019-07-06: 105 mg via INTRAVENOUS
  Filled 2019-07-06: qty 5

## 2019-07-06 MED ORDER — SODIUM CHLORIDE 0.9 % IV SOLN
80.0000 mg/m2 | Freq: Once | INTRAVENOUS | Status: AC
  Administered 2019-07-06: 126 mg via INTRAVENOUS
  Filled 2019-07-06: qty 21

## 2019-07-06 MED ORDER — DEXAMETHASONE SODIUM PHOSPHATE 10 MG/ML IJ SOLN
INTRAMUSCULAR | Status: AC
  Filled 2019-07-06: qty 1

## 2019-07-06 MED ORDER — FAMOTIDINE IN NACL 20-0.9 MG/50ML-% IV SOLN
20.0000 mg | Freq: Once | INTRAVENOUS | Status: AC
  Administered 2019-07-06: 20 mg via INTRAVENOUS

## 2019-07-06 MED ORDER — DIPHENHYDRAMINE HCL 50 MG/ML IJ SOLN
INTRAMUSCULAR | Status: AC
  Filled 2019-07-06: qty 1

## 2019-07-06 MED ORDER — DEXAMETHASONE SODIUM PHOSPHATE 10 MG/ML IJ SOLN
10.0000 mg | Freq: Once | INTRAMUSCULAR | Status: AC
  Administered 2019-07-06: 10 mg via INTRAVENOUS

## 2019-07-06 MED ORDER — PACLITAXEL PROTEIN-BOUND CHEMO INJECTION 100 MG: 80.0000 mg/m2 | Freq: Once | INTRAVENOUS | Status: DC

## 2019-07-06 MED ORDER — SODIUM CHLORIDE 0.9 % IV SOLN
Freq: Once | INTRAVENOUS | Status: AC
  Filled 2019-07-06: qty 250

## 2019-07-06 NOTE — Patient Instructions (Addendum)
[facility] Discharge Instructions for Patients Receiving Chemotherapy  Today you received the following chemotherapy agents: Trastuzumab and Taxol  To help prevent nausea and vomiting after your treatment, we encourage you to take your nausea medication  as prescribed.    If you develop nausea and vomiting that is not controlled by your nausea medication, call the clinic.   BELOW ARE SYMPTOMS THAT SHOULD BE REPORTED IMMEDIATELY:  *FEVER GREATER THAN 100.5 F  *CHILLS WITH OR WITHOUT FEVER  NAUSEA AND VOMITING THAT IS NOT CONTROLLED WITH YOUR NAUSEA MEDICATION  *UNUSUAL SHORTNESS OF BREATH  *UNUSUAL BRUISING OR BLEEDING  TENDERNESS IN MOUTH AND THROAT WITH OR WITHOUT PRESENCE OF ULCERS  *URINARY PROBLEMS  *BOWEL PROBLEMS  UNUSUAL RASH Items with * indicate a potential emergency and should be followed up as soon as possible.  Feel free to call the clinic should you have any questions or concerns. The clinic phone number is [phone].  Please show the CHEMO ALERT CARD at check-in to the Emergency Department and triage nurse.   

## 2019-07-12 NOTE — Progress Notes (Signed)
[place]   [phone] [phone]) [phone]   Clinic Follow up Note   Patient Care Team: Wenda Low, MD as PCP - General (Internal Medicine) Rockwell Germany, RN as Oncology Nurse Navigator Mauro Kaufmann, RN as Oncology Nurse Navigator Erroll Luna, MD as Consulting Physician (General Surgery) Truitt Merle, MD as Consulting Physician (Hematology) Gery Pray, MD as Consulting Physician (Radiation Oncology)  Date of Service:  07/13/2019  CHIEF COMPLAINT: F/u of left breast cancer  SUMMARY OF ONCOLOGIC HISTORY: Oncology History Overview Note  Cancer Staging Malignant neoplasm of upper-inner quadrant of left breast in female, estrogen receptor positive ([place]) Staging form: Breast, AJCC 8th Edition - Clinical stage from 03/22/2019: Stage IA (cT1c, cN0, cM0, G3, ER+, PR+, HER2+) - Signed by Truitt Merle, MD on 03/22/2019    Malignant neoplasm of upper-inner quadrant of left breast in female, estrogen receptor positive ([place])  03/10/2019 Mammogram   Diagnostic Mammogram 03/10/19  IMPRESSION: 1. Suspicious mass in the 10 o'clock position of the left breast, 3cm from the nipple measuring 12x7x33m. Biopsy is indicated. 2. Indeterminate mass in the 5:30 o'clock position of the left breast, 5cm from the nipple, measuring 9x6x738m Although this may reflect a fibroadenoma, tissue sampling is recommended.   03/16/2019 Initial Biopsy   Diagnosis 03/16/19  1. 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 SEE NOTE 2. Breast, left, needle core biopsy, 5:30 o'clock - FIBROADENOMA   03/16/2019 Receptors her2   Results: IMMUNOHISTOCHEMICAL AND MORPHOMETRIC ANALYSIS PERFORMED MANUALLY The tumor cells are POSITIVE for Her2 (3+). Estrogen Receptor: 100%, POSITIVE, STRONG STAINING INTENSITY Progesterone Receptor: 100%, POSITIVE, STRONG STAINING INTENSITY Proliferation Marker Ki67: 60%   03/21/2019 Initial Diagnosis   Malignant  neoplasm of upper-inner quadrant of left breast in female, estrogen receptor positive ([place]  03/22/2019 Cancer Staging   Staging form: Breast, AJCC 8th Edition - Clinical stage from 03/22/2019: Stage IA (cT1c, cN0, cM0, G3, ER+, PR+, HER2+) - Signed by FeTruitt MerleMD on 03/22/2019   03/31/2019 Genetic Testing   VUS in CTNNA1 called [id number] (Intronic) was identified on the [facility] Common Hereditary Cancers Panel. The Common Hereditary Cancers Panel offered by [facility] includes sequencing and/or deletion duplication testing of the following 47 genes: APC, ATM, AXIN2, BARD1, BMPR1A, BRCA1, BRCA2, BRIP1, CDH1, CDKN2A (p14ARF), CDKN2A (p16INK4a), CKD4, CHEK2, CTNNA1, DICER1, EPCAM (Deletion/duplication testing only), GREM1 (promoter region deletion/duplication testing only), KIT, MEN1, MLH1, MSH2, MSH3, MSH6, MUTYH, NBN, NF1, NHTL1, PALB2, PDGFRA, PMS2, POLD1, POLE, PTEN, RAD50, RAD51C, RAD51D, SDHB, SDHC, SDHD, SMAD4, SMARCA4. STK11, TP53, TSC1, TSC2, and VHL.  The following genes were evaluated for sequence changes only: SDHA and HOXB13 c.251G>A variant only. The report date is 03/30/2019.    03/31/2019 Breast MRI   MRI breast 03/31/19  IMPRESSION: 1. 5 millimeter enhancing oval mass in the anteromedial aspect of the RIGHT breast warranting further evaluation. Given its anterior depth and visibility on noncontrast images, ultrasound is recommended to determine if there is a sonographic correlate. If no sonographic correlate is identified to guide biopsy, MRI biopsy of the RIGHT breast is recommended. 2. Known malignancy in the UPPER INNER QUADRANT of the LEFT breast measuring 1.0 centimeters. 3. 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   04/04/2019 Surgery   RADIOCATIVE SEED GUIDED LEFT BREAST LUMPECTOMY, LEFT AXILLARY SENTINEL LYMPH NODE BIOPSY and PAC placement  By Dr. CoBrantley Stage9/22/20    04/04/2019 Pathology Results   DIAGNOSIS: 04/04/19  A. BREAST,  LEFT,  LUMPECTOMY:  -  Invasive ductal carcinoma, Nottingham grade 3 of 3, 1.3 cm  -  Ductal carcinoma in-situ, intermediate grade  -  Margins uninvolved by carcinoma (0.1 cm; posterior margin; see  comment)  -  Previous biopsy site changes present  -  See oncology table and comment below   B. BREAST, LEFT, ADDITIONAL MEDIAL MARGIN, EXCISION:  -  Fibrocystic changes  -  No residual carcinoma identified   C. LYMPH NODE, LEFT AXILLARY, SENTINEL, BIOPSY:  -   No carcinoma identified in one lymph node (0/1)    04/04/2019 Cancer Staging   Staging form: Breast, AJCC 8th Edition - Pathologic stage from 04/04/2019: Stage IA (pT1c, pN0, cM0, G3, ER+, PR+, HER2+) - Signed by Truitt Merle, MD on 04/21/2019   04/11/2019 Pathology Results   Diagnosis Breast, right, needle core biopsy, 2 o'clock - SMALL INTRADUCTAL PAPILLOMA. SEE NOTE - SMALL FIBROADENOMA - NEGATIVE FOR CARCINOMA   05/12/2019 -  Chemotherapy   adjuvant Weekly Taxol and Herceptin for 12 weeks starting 05/12/19. Followed by maintenance Herceptin q3weeks to complete 1 year treatment.       CURRENT THERAPY:  adjuvant Weekly Taxol and Herceptin for 12 weeksstarting 05/12/19. Followed by maintenance Herceptin q3weeks to complete 1 year treatment.  INTERVAL HISTORY:  Danniel Grenz Eversley is here for a follow up and treatment. She presents to the clinic alone. She notes her rash has not worsened. She notes she IBS flare with use of laxative. This has resolved.    REVIEW OF SYSTEMS:   Constitutional: Denies fevers, chills or abnormal weight loss Eyes: Denies blurriness of vision Ears, nose, mouth, throat, and face: Denies mucositis or sore throat Respiratory: Denies cough, dyspnea or wheezes Cardiovascular: Denies palpitation, chest discomfort or lower extremity swelling Gastrointestinal:  Denies nausea, heartburn or change in bowel habits Skin: Denies abnormal skin rashes Lymphatics: Denies new lymphadenopathy or easy  bruising Neurological:Denies numbness, tingling or new weaknesses Behavioral/Psych: Mood is stable, no new changes  All other systems were reviewed with the patient and are negative.  MEDICAL HISTORY:  Past Medical History:  Diagnosis Date  . Asthma   . Family history of breast cancer   . Family history of lung cancer     SURGICAL HISTORY: Past Surgical History:  Procedure Laterality Date  . BREAST BIOPSY Right   . BREAST LUMPECTOMY WITH RADIOACTIVE SEED AND SENTINEL LYMPH NODE BIOPSY Left 04/04/2019   Procedure: RADIOCATIVE SEED GUIDED LEFT BREAST LUMPECTOMY, LEFT AXILLARY SENTINEL LYMPH NODE BIOPSY;  Surgeon: Erroll Luna, MD;  Location: [facility];  Service: General;  Laterality: Left;  . NASAL SINUS SURGERY    . PORTACATH PLACEMENT N/A 04/04/2019   Procedure: INSERTION PORT-A-CATH WITH ULTRASOUND;  Surgeon: Erroll Luna, MD;  Location: [facility];  Service: General;  Laterality: N/A;    I have reviewed the social history and family history with the patient and they are unchanged from previous note.  ALLERGIES:  is allergic to doxycycline and latex.  MEDICATIONS:  Current Outpatient Medications  Medication Sig Dispense Refill  . albuterol (PROVENTIL HFA;VENTOLIN HFA) 108 (90 BASE) MCG/ACT inhaler Inhale 2 puffs into the lungs every 6 (six) hours as needed. For shortness of breath    . ALPRAZolam (XANAX) 1 MG tablet Take 0.5 mg by mouth 2 (two) times daily as needed.    . Ascorbic Acid (VITAMIN C) 1000 MG tablet Take 1,000 mg by mouth daily.    . cholecalciferol (VITAMIN D3) 25 MCG (1000 UT)  tablet Take 2,000 Units by mouth daily.     . clindamycin-benzoyl peroxide (BENZACLIN) gel Apply topically 2 (two) times daily. 25 g 0  . fexofenadine (ALLEGRA) 60 MG tablet Take 60 mg by mouth 2 (two) times daily.    . fluticasone (FLONASE) 50 MCG/ACT nasal spray Place into both nostrils daily.    Marland Kitchen lidocaine-prilocaine (EMLA) cream Apply to affected area  once 30 g 3  . omeprazole (PRILOSEC) 20 MG capsule Take 1 capsule (20 mg total) by mouth daily. 30 capsule 2  . ondansetron (ZOFRAN) 8 MG tablet Take 1 tablet (8 mg total) by mouth 2 (two) times daily as needed (Nausea or vomiting). 30 tablet 1  . prochlorperazine (COMPAZINE) 10 MG tablet Take 1 tablet (10 mg total) by mouth every 6 (six) hours as needed (Nausea or vomiting). 30 tablet 1  . SUMAtriptan (IMITREX) 100 MG tablet Take 100 mg by mouth.     No current facility-administered medications for this visit.    PHYSICAL EXAMINATION: ECOG PERFORMANCE STATUS: 1 - Symptomatic but completely ambulatory  Vitals:   07/13/19 1330  BP: 120/83  Pulse: 89  Resp: 18  Temp: 97.7 F (36.5 C)  SpO2: 100%   Filed Weights   07/13/19 1330  Weight: 125 lb 8 oz (56.9 kg)     Due to COVID19 we will limit examination to appearance. Patient had no complaints.  GENERAL:alert, no distress and comfortable SKIN: skin color normal, no rashes or significant lesions except mild skin rash on her scalp  EYES: normal, Conjunctiva are pink and non-injected, sclera clear  NEURO: alert & oriented x 3 with fluent speech  LABORATORY DATA:  I have reviewed the data as listed CBC Latest Ref Rng & Units 07/13/2019 07/06/2019 06/30/2019  WBC 4.0 - 10.5 K/uL 7.3 5.5 6.3  Hemoglobin 12.0 - 15.0 g/dL 10.7(L) 11.0(L) 10.7(L)  Hematocrit 36.0 - 46.0 % 31.7(L) 32.5(L) 31.5(L)  Platelets 150 - 400 K/uL 388 387 396     CMP Latest Ref Rng & Units 07/13/2019 07/06/2019 06/30/2019  Glucose 70 - 99 mg/dL 116(H) 115(H) 77  BUN 6 - 20 mg/dL '15 16 13  ' Creatinine 0.44 - 1.00 mg/dL 0.67 0.74 0.66  Sodium 135 - 145 mmol/L 140 141 141  Potassium 3.5 - 5.1 mmol/L 4.1 4.2 4.4  Chloride 98 - 111 mmol/L 104 107 106  CO2 22 - 32 mmol/L '27 26 26  ' Calcium 8.9 - 10.3 mg/dL 9.2 9.0 8.9  Total Protein 6.5 - 8.1 g/dL 6.8 7.0 6.9  Total Bilirubin 0.3 - 1.2 mg/dL <0.2(L) 0.3 0.3  Alkaline Phos 38 - 126 U/L 89 79 73  AST 15 - 41 U/L  128(H) 43(H) 37  ALT 0 - 44 U/L 175(H) 76(H) 80(H)      RADIOGRAPHIC STUDIES: I have personally reviewed the radiological images as listed and agreed with the findings in the report. No results found.   ASSESSMENT & PLAN:  Chelsea Hobbs is a 48 y.o. 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 She underwent left breastlumpectomyand SLNB on 04/04/19.Her DCIS margins were close (32m). -Her breast MRI from 03/31/19 showed known left breast cancer and 1.561menhancing right breast mass. Her right breast biopsy from 04/11/19 showed small intraductal papilloma which is benign.Dr. CoBrantley Stageecommendedlumpectomy to remove this in the future. -Will discuss her option of b/l mastectomy orrightlumpectomy after chemo treatment. -Given her 1.3cm tumor, node negative disease Istarted  her on weekly Taxoland anti-Her2 Herceptinfor 12 weekson 05/12/19.This will be followed byHerceptinmaintenanceq3weeks to complete 1 year of treatment.I discussed Herceptin in injection form for her maintenance therapy. She opted to continue infusion.   -S/p week 9 she was more fatigued but rash is well controlled. Abraxane was not approved by insurance, but she is fine to continue Taxol.  -Labs reviewed, and adequate to proceed with week 10 Taxol and Herceptin.  -She will continue monitoring heart function on herceptin with ECHO, next in 1-2 weeks.  -f/u in 2 weeks with last weekly treatment.    2. Bone Health -Per pt when she had her IUD removedin 03/2019. Her repeated hormonal labs show she is not postmenopausal. She still has periods. Will retest in 1 year and hold on baseline DEXA scan for now.   3. Genetic Testingwas negative for pathogenetic nutations  4. Anxiety  -Previously on Xanaxand Effexor. -Managed by her PCP -She will f/u with SW office as needed about her finances  and stress.  5. Colitis  -She has colitis and uses Mesalamine Suppositories. Will monitor on chemo. -She had constipation from chemo. She had flare with laxative. I suggest she use stool softeners once daily and can use Miralax if not enough   6. Skin rash  -Likely secondary to Taxol  -On scalp, head is shaved clean, chest and back -She will continue Claritin, OTC hydrocortisone cream and clindamycin. -Abraxane was not approved by insurance, but rash is manageable currently, will continue Taxol   7. Insomnia  -I recommend she use Melatonin or benadryl. She is willing to try    PLAN: -ECHO in 1-2 weeks -Labs reviewed and adequate to proceed with week 10 Taxol andTranstuzumabtoday and continue weekly,  -f/u in 2 weeks before last dose Taxol     No problem-specific Assessment & Plan notes found for this encounter.   Orders Placed This Encounter  Procedures  . ECHOCARDIOGRAM COMPLETE    Standing Status:   Future    Standing Expiration Date:   10/10/2020    Order Specific Question:   Where should this test be performed    Answer:   [facility]    Order Specific Question:   Perflutren DEFINITY (image enhancing agent) should be administered unless hypersensitivity or allergy exist    Answer:   Administer Perflutren    Order Specific Question:   Is a special reader required? (athlete or structural heart)    Answer:   No    Order Specific Question:   Reason for exam-Echo    Answer:   Chemo  V67.2 / Z09   All questions were answered. The patient knows to call the clinic with any problems, questions or concerns. No barriers to learning was detected. I spent 15 minutes counseling the patient face to face. The total time spent in the appointment was 20 minutes and more than 50% was on counseling and review of test results     Truitt Merle, MD 07/13/2019   I, Joslyn Devon, am acting as scribe for Truitt Merle, MD.   I have reviewed the above documentation for accuracy and  completeness, and I agree with the above.

## 2019-07-13 ENCOUNTER — Other Ambulatory Visit: Payer: Self-pay

## 2019-07-13 ENCOUNTER — Encounter: Payer: Self-pay | Admitting: Hematology

## 2019-07-13 ENCOUNTER — Inpatient Hospital Stay: Payer: BC Managed Care – PPO

## 2019-07-13 ENCOUNTER — Inpatient Hospital Stay (HOSPITAL_BASED_OUTPATIENT_CLINIC_OR_DEPARTMENT_OTHER): Payer: BC Managed Care – PPO | Admitting: Hematology

## 2019-07-13 VITALS — BP 120/83 | HR 89 | Temp 97.7°F | Resp 18 | Ht 62.0 in | Wt 125.5 lb

## 2019-07-13 DIAGNOSIS — K529 Noninfective gastroenteritis and colitis, unspecified: Secondary | ICD-10-CM | POA: Diagnosis not present

## 2019-07-13 DIAGNOSIS — Z79899 Other long term (current) drug therapy: Secondary | ICD-10-CM | POA: Diagnosis not present

## 2019-07-13 DIAGNOSIS — C50212 Malignant neoplasm of upper-inner quadrant of left female breast: Secondary | ICD-10-CM

## 2019-07-13 DIAGNOSIS — Z170 Estrogen receptor positive status [ER+]: Secondary | ICD-10-CM

## 2019-07-13 DIAGNOSIS — R21 Rash and other nonspecific skin eruption: Secondary | ICD-10-CM | POA: Diagnosis not present

## 2019-07-13 DIAGNOSIS — Z5111 Encounter for antineoplastic chemotherapy: Secondary | ICD-10-CM | POA: Diagnosis not present

## 2019-07-13 DIAGNOSIS — Z95828 Presence of other vascular implants and grafts: Secondary | ICD-10-CM

## 2019-07-13 DIAGNOSIS — F419 Anxiety disorder, unspecified: Secondary | ICD-10-CM | POA: Diagnosis not present

## 2019-07-13 LAB — CBC WITH DIFFERENTIAL (CANCER CENTER ONLY)
Abs Immature Granulocytes: 0.18 10*3/uL — ABNORMAL HIGH (ref 0.00–0.07)
Basophils Absolute: 0.1 10*3/uL (ref 0.0–0.1)
Basophils Relative: 1 %
Eosinophils Absolute: 0.2 10*3/uL (ref 0.0–0.5)
Eosinophils Relative: 3 %
HCT: 31.7 % — ABNORMAL LOW (ref 36.0–46.0)
Hemoglobin: 10.7 g/dL — ABNORMAL LOW (ref 12.0–15.0)
Immature Granulocytes: 3 %
Lymphocytes Relative: 33 %
Lymphs Abs: 2.4 10*3/uL (ref 0.7–4.0)
MCH: 30.9 pg (ref 26.0–34.0)
MCHC: 33.8 g/dL (ref 30.0–36.0)
MCV: 91.6 fL (ref 80.0–100.0)
Monocytes Absolute: 0.6 10*3/uL (ref 0.1–1.0)
Monocytes Relative: 8 %
Neutro Abs: 3.9 10*3/uL (ref 1.7–7.7)
Neutrophils Relative %: 52 %
Platelet Count: 388 10*3/uL (ref 150–400)
RBC: 3.46 MIL/uL — ABNORMAL LOW (ref 3.87–5.11)
RDW: 14 % (ref 11.5–15.5)
WBC Count: 7.3 10*3/uL (ref 4.0–10.5)
nRBC: 0 % (ref 0.0–0.2)

## 2019-07-13 LAB — CMP (CANCER CENTER ONLY)
ALT: 175 U/L — ABNORMAL HIGH (ref 0–44)
AST: 128 U/L — ABNORMAL HIGH (ref 15–41)
Albumin: 3.8 g/dL (ref 3.5–5.0)
Alkaline Phosphatase: 89 U/L (ref 38–126)
Anion gap: 9 (ref 5–15)
BUN: 15 mg/dL (ref 6–20)
CO2: 27 mmol/L (ref 22–32)
Calcium: 9.2 mg/dL (ref 8.9–10.3)
Chloride: 104 mmol/L (ref 98–111)
Creatinine: 0.67 mg/dL (ref 0.44–1.00)
GFR, Est AFR Am: >60 mL/min (ref >60)
GFR, Estimated: >60 mL/min (ref >60)
Glucose, Bld: 116 mg/dL — ABNORMAL HIGH (ref 70–99)
Potassium: 4.1 mmol/L (ref 3.5–5.1)
Sodium: 140 mmol/L (ref 135–145)
Total Bilirubin: <0.2 mg/dL — ABNORMAL LOW (ref 0.3–1.2)
Total Protein: 6.8 g/dL (ref 6.5–8.1)

## 2019-07-13 MED ORDER — ACETAMINOPHEN 325 MG PO TABS
ORAL_TABLET | ORAL | Status: AC
  Filled 2019-07-13: qty 2

## 2019-07-13 MED ORDER — SODIUM CHLORIDE 0.9 % IV SOLN
60.0000 mg/m2 | Freq: Once | INTRAVENOUS | Status: AC
  Administered 2019-07-13: 96 mg via INTRAVENOUS
  Filled 2019-07-13: qty 16

## 2019-07-13 MED ORDER — HEPARIN SOD (PORK) LOCK FLUSH 100 UNIT/ML IV SOLN
500.0000 [IU] | Freq: Once | INTRAVENOUS | Status: AC | PRN
  Administered 2019-07-13: 500 [IU]
  Filled 2019-07-13: qty 5

## 2019-07-13 MED ORDER — DIPHENHYDRAMINE HCL 50 MG/ML IJ SOLN
INTRAMUSCULAR | Status: AC
  Filled 2019-07-13: qty 1

## 2019-07-13 MED ORDER — SODIUM CHLORIDE 0.9 % IV SOLN
Freq: Once | INTRAVENOUS | Status: AC
  Filled 2019-07-13: qty 250

## 2019-07-13 MED ORDER — DIPHENHYDRAMINE HCL 50 MG/ML IJ SOLN
50.0000 mg | Freq: Once | INTRAMUSCULAR | Status: AC
  Administered 2019-07-13: 50 mg via INTRAVENOUS

## 2019-07-13 MED ORDER — PACLITAXEL PROTEIN-BOUND CHEMO INJECTION 100 MG: 80.0000 mg/m2 | Freq: Once | INTRAVENOUS | Status: DC

## 2019-07-13 MED ORDER — TRASTUZUMAB-DKST CHEMO 150 MG IV SOLR
2.0000 mg/kg | Freq: Once | INTRAVENOUS | Status: AC
  Administered 2019-07-13: 105 mg via INTRAVENOUS
  Filled 2019-07-13: qty 5

## 2019-07-13 MED ORDER — FAMOTIDINE IN NACL 20-0.9 MG/50ML-% IV SOLN
20.0000 mg | Freq: Once | INTRAVENOUS | Status: AC
  Administered 2019-07-13: 20 mg via INTRAVENOUS

## 2019-07-13 MED ORDER — DEXAMETHASONE SODIUM PHOSPHATE 10 MG/ML IJ SOLN
10.0000 mg | Freq: Once | INTRAMUSCULAR | Status: AC
  Administered 2019-07-13: 10 mg via INTRAVENOUS

## 2019-07-13 MED ORDER — FAMOTIDINE IN NACL 20-0.9 MG/50ML-% IV SOLN
INTRAVENOUS | Status: AC
  Filled 2019-07-13: qty 50

## 2019-07-13 MED ORDER — SODIUM CHLORIDE 0.9% FLUSH
10.0000 mL | INTRAVENOUS | Status: DC | PRN
  Administered 2019-07-13: 10 mL
  Filled 2019-07-13: qty 10

## 2019-07-13 MED ORDER — DEXAMETHASONE SODIUM PHOSPHATE 10 MG/ML IJ SOLN
INTRAMUSCULAR | Status: AC
  Filled 2019-07-13: qty 1

## 2019-07-13 MED ORDER — ACETAMINOPHEN 325 MG PO TABS
650.0000 mg | ORAL_TABLET | Freq: Once | ORAL | Status: AC
  Administered 2019-07-13: 650 mg via ORAL

## 2019-07-13 NOTE — Patient Instructions (Signed)
[facility] Discharge Instructions for Patients Receiving Chemotherapy  Today you received the following chemotherapy agents: Trastuzumab and Taxol  To help prevent nausea and vomiting after your treatment, we encourage you to take your nausea medication  as prescribed.    If you develop nausea and vomiting that is not controlled by your nausea medication, call the clinic.   BELOW ARE SYMPTOMS THAT SHOULD BE REPORTED IMMEDIATELY:  *FEVER GREATER THAN 100.5 F  *CHILLS WITH OR WITHOUT FEVER  NAUSEA AND VOMITING THAT IS NOT CONTROLLED WITH YOUR NAUSEA MEDICATION  *UNUSUAL SHORTNESS OF BREATH  *UNUSUAL BRUISING OR BLEEDING  TENDERNESS IN MOUTH AND THROAT WITH OR WITHOUT PRESENCE OF ULCERS  *URINARY PROBLEMS  *BOWEL PROBLEMS  UNUSUAL RASH Items with * indicate a potential emergency and should be followed up as soon as possible.  Feel free to call the clinic should you have any questions or concerns. The clinic phone number is [phone].  Please show the CHEMO ALERT CARD at check-in to the Emergency Department and triage nurse.   

## 2019-07-13 NOTE — Progress Notes (Signed)
Per Dr. Burr Medico: OK to treat today with eleveted AST/ALT. May possibly dose reduce regimen (Dr. Burr Medico to discuss with pharmacy team)

## 2019-07-14 HISTORY — PX: COLONOSCOPY: SHX174

## 2019-07-17 ENCOUNTER — Telehealth: Payer: Self-pay | Admitting: Hematology

## 2019-07-17 DIAGNOSIS — Z20828 Contact with and (suspected) exposure to other viral communicable diseases: Secondary | ICD-10-CM | POA: Diagnosis not present

## 2019-07-17 DIAGNOSIS — U071 COVID-19: Secondary | ICD-10-CM | POA: Diagnosis not present

## 2019-07-17 DIAGNOSIS — Z8616 Personal history of COVID-19: Secondary | ICD-10-CM

## 2019-07-17 HISTORY — DX: Personal history of COVID-19: Z86.16

## 2019-07-17 NOTE — Telephone Encounter (Signed)
Per 12/31 los Echo in 1-2 weeks

## 2019-07-18 ENCOUNTER — Encounter: Payer: Self-pay | Admitting: *Deleted

## 2019-07-20 ENCOUNTER — Telehealth: Payer: Self-pay

## 2019-07-20 NOTE — Telephone Encounter (Signed)
Patient calls to report she tested positive for Covid 19 on Monday, informed her per Dr. Burr Medico we will need to cancel her appointments for this week and next week.  Will attempt to schedule her for 08/04/2019 providing she is not symptomatic, she requested a work excuse be mailed to her (which has been done), she verbalized an understanding and a scheduling message has been sent.

## 2019-07-21 ENCOUNTER — Other Ambulatory Visit: Payer: BC Managed Care – PPO

## 2019-07-21 ENCOUNTER — Ambulatory Visit: Payer: BC Managed Care – PPO

## 2019-07-21 ENCOUNTER — Encounter: Payer: Self-pay | Admitting: *Deleted

## 2019-07-21 ENCOUNTER — Telehealth: Payer: Self-pay | Admitting: Hematology

## 2019-07-21 NOTE — Telephone Encounter (Signed)
Scheduled appt per 1/7 sch message  - unable to reach pt - left message with appt date and time ,

## 2019-07-24 ENCOUNTER — Encounter: Payer: Self-pay | Admitting: *Deleted

## 2019-07-24 ENCOUNTER — Other Ambulatory Visit (HOSPITAL_COMMUNITY): Payer: BC Managed Care – PPO

## 2019-07-25 ENCOUNTER — Telehealth: Payer: Self-pay | Admitting: Hematology

## 2019-07-25 NOTE — Telephone Encounter (Signed)
Scheduled appt per 1/12 sch message - unable to reach pt . Left message with apt date and time

## 2019-07-28 ENCOUNTER — Ambulatory Visit: Payer: BC Managed Care – PPO

## 2019-07-28 ENCOUNTER — Other Ambulatory Visit: Payer: BC Managed Care – PPO

## 2019-07-28 ENCOUNTER — Ambulatory Visit: Payer: BC Managed Care – PPO | Admitting: Hematology

## 2019-08-01 ENCOUNTER — Telehealth: Payer: Self-pay | Admitting: Hematology

## 2019-08-01 DIAGNOSIS — Z20828 Contact with and (suspected) exposure to other viral communicable diseases: Secondary | ICD-10-CM | POA: Diagnosis not present

## 2019-08-01 NOTE — Telephone Encounter (Signed)
Called pt per 1/18 sch message - unable to reach pt- left message with appt changes

## 2019-08-02 ENCOUNTER — Encounter: Payer: Self-pay | Admitting: *Deleted

## 2019-08-04 ENCOUNTER — Other Ambulatory Visit: Payer: Self-pay

## 2019-08-04 ENCOUNTER — Inpatient Hospital Stay: Payer: BC Managed Care – PPO | Attending: Hematology

## 2019-08-04 ENCOUNTER — Inpatient Hospital Stay (HOSPITAL_BASED_OUTPATIENT_CLINIC_OR_DEPARTMENT_OTHER): Payer: BC Managed Care – PPO | Admitting: Physician Assistant

## 2019-08-04 ENCOUNTER — Inpatient Hospital Stay: Payer: BC Managed Care – PPO

## 2019-08-04 VITALS — BP 120/79 | HR 80 | Temp 97.8°F | Resp 18 | Ht 62.0 in | Wt 123.5 lb

## 2019-08-04 DIAGNOSIS — F419 Anxiety disorder, unspecified: Secondary | ICD-10-CM | POA: Insufficient documentation

## 2019-08-04 DIAGNOSIS — D649 Anemia, unspecified: Secondary | ICD-10-CM | POA: Insufficient documentation

## 2019-08-04 DIAGNOSIS — Z8616 Personal history of COVID-19: Secondary | ICD-10-CM | POA: Diagnosis not present

## 2019-08-04 DIAGNOSIS — Z95828 Presence of other vascular implants and grafts: Secondary | ICD-10-CM

## 2019-08-04 DIAGNOSIS — K529 Noninfective gastroenteritis and colitis, unspecified: Secondary | ICD-10-CM | POA: Diagnosis not present

## 2019-08-04 DIAGNOSIS — Z170 Estrogen receptor positive status [ER+]: Secondary | ICD-10-CM | POA: Diagnosis not present

## 2019-08-04 DIAGNOSIS — Z79899 Other long term (current) drug therapy: Secondary | ICD-10-CM | POA: Insufficient documentation

## 2019-08-04 DIAGNOSIS — C50212 Malignant neoplasm of upper-inner quadrant of left female breast: Secondary | ICD-10-CM

## 2019-08-04 DIAGNOSIS — Z5111 Encounter for antineoplastic chemotherapy: Secondary | ICD-10-CM | POA: Diagnosis not present

## 2019-08-04 LAB — CMP (CANCER CENTER ONLY)
ALT: 37 U/L (ref 0–44)
AST: 23 U/L (ref 15–41)
Albumin: 4.1 g/dL (ref 3.5–5.0)
Alkaline Phosphatase: 68 U/L (ref 38–126)
Anion gap: 10 (ref 5–15)
BUN: 16 mg/dL (ref 6–20)
CO2: 25 mmol/L (ref 22–32)
Calcium: 9.1 mg/dL (ref 8.9–10.3)
Chloride: 107 mmol/L (ref 98–111)
Creatinine: 0.68 mg/dL (ref 0.44–1.00)
GFR, Est AFR Am: >60 mL/min (ref >60)
GFR, Estimated: >60 mL/min (ref >60)
Glucose, Bld: 95 mg/dL (ref 70–99)
Potassium: 4.3 mmol/L (ref 3.5–5.1)
Sodium: 142 mmol/L (ref 135–145)
Total Bilirubin: 0.3 mg/dL (ref 0.3–1.2)
Total Protein: 7 g/dL (ref 6.5–8.1)

## 2019-08-04 LAB — CBC WITH DIFFERENTIAL (CANCER CENTER ONLY)
Abs Immature Granulocytes: 0.02 10*3/uL (ref 0.00–0.07)
Basophils Absolute: 0.1 10*3/uL (ref 0.0–0.1)
Basophils Relative: 1 %
Eosinophils Absolute: 0.3 10*3/uL (ref 0.0–0.5)
Eosinophils Relative: 3 %
HCT: 34.1 % — ABNORMAL LOW (ref 36.0–46.0)
Hemoglobin: 11.5 g/dL — ABNORMAL LOW (ref 12.0–15.0)
Immature Granulocytes: 0 %
Lymphocytes Relative: 32 %
Lymphs Abs: 2.9 10*3/uL (ref 0.7–4.0)
MCH: 31.4 pg (ref 26.0–34.0)
MCHC: 33.7 g/dL (ref 30.0–36.0)
MCV: 93.2 fL (ref 80.0–100.0)
Monocytes Absolute: 0.8 10*3/uL (ref 0.1–1.0)
Monocytes Relative: 8 %
Neutro Abs: 5.1 10*3/uL (ref 1.7–7.7)
Neutrophils Relative %: 56 %
Platelet Count: 363 10*3/uL (ref 150–400)
RBC: 3.66 MIL/uL — ABNORMAL LOW (ref 3.87–5.11)
RDW: 13.4 % (ref 11.5–15.5)
WBC Count: 9.2 10*3/uL (ref 4.0–10.5)
nRBC: 0 % (ref 0.0–0.2)

## 2019-08-04 MED ORDER — ACETAMINOPHEN 325 MG PO TABS
650.0000 mg | ORAL_TABLET | Freq: Once | ORAL | Status: AC
  Administered 2019-08-04: 650 mg via ORAL

## 2019-08-04 MED ORDER — ONDANSETRON HCL 4 MG/2ML IJ SOLN
8.0000 mg | Freq: Once | INTRAMUSCULAR | Status: AC
  Administered 2019-08-04: 8 mg via INTRAVENOUS

## 2019-08-04 MED ORDER — ACETAMINOPHEN 325 MG PO TABS
ORAL_TABLET | ORAL | Status: AC
  Filled 2019-08-04: qty 2

## 2019-08-04 MED ORDER — TRASTUZUMAB-DKST CHEMO 150 MG IV SOLR
4.0000 mg/kg | Freq: Once | INTRAVENOUS | Status: AC
  Administered 2019-08-04: 231 mg via INTRAVENOUS
  Filled 2019-08-04: qty 11

## 2019-08-04 MED ORDER — PACLITAXEL PROTEIN-BOUND CHEMO INJECTION 100 MG
80.0000 mg/m2 | Freq: Once | INTRAVENOUS | Status: AC
  Administered 2019-08-04: 125 mg via INTRAVENOUS
  Filled 2019-08-04: qty 25

## 2019-08-04 MED ORDER — SODIUM CHLORIDE 0.9% FLUSH
10.0000 mL | INTRAVENOUS | Status: DC | PRN
  Administered 2019-08-04: 10 mL
  Filled 2019-08-04: qty 10

## 2019-08-04 MED ORDER — ONDANSETRON HCL 4 MG/2ML IJ SOLN
INTRAMUSCULAR | Status: AC
  Filled 2019-08-04: qty 4

## 2019-08-04 MED ORDER — DIPHENHYDRAMINE HCL 50 MG/ML IJ SOLN
INTRAMUSCULAR | Status: AC
  Filled 2019-08-04: qty 1

## 2019-08-04 MED ORDER — DIPHENHYDRAMINE HCL 50 MG/ML IJ SOLN
50.0000 mg | Freq: Once | INTRAMUSCULAR | Status: AC
  Administered 2019-08-04: 50 mg via INTRAVENOUS

## 2019-08-04 MED ORDER — HEPARIN SOD (PORK) LOCK FLUSH 100 UNIT/ML IV SOLN
500.0000 [IU] | Freq: Once | INTRAVENOUS | Status: AC | PRN
  Administered 2019-08-04: 500 [IU]
  Filled 2019-08-04: qty 5

## 2019-08-04 MED ORDER — SODIUM CHLORIDE 0.9 % IV SOLN
Freq: Once | INTRAVENOUS | Status: AC
  Filled 2019-08-04: qty 250

## 2019-08-04 NOTE — Patient Instructions (Signed)
[facility] Discharge Instructions for Patients Receiving Chemotherapy  Today you received the following chemotherapy agents: Abraxane, Herceptin.   To help prevent nausea and vomiting after your treatment, we encourage you to take your nausea medication as directed.   If you develop nausea and vomiting that is not controlled by your nausea medication, call the clinic.   BELOW ARE SYMPTOMS THAT SHOULD BE REPORTED IMMEDIATELY:  *FEVER GREATER THAN 100.5 F  *CHILLS WITH OR WITHOUT FEVER  NAUSEA AND VOMITING THAT IS NOT CONTROLLED WITH YOUR NAUSEA MEDICATION  *UNUSUAL SHORTNESS OF BREATH  *UNUSUAL BRUISING OR BLEEDING  TENDERNESS IN MOUTH AND THROAT WITH OR WITHOUT PRESENCE OF ULCERS  *URINARY PROBLEMS  *BOWEL PROBLEMS  UNUSUAL RASH Items with * indicate a potential emergency and should be followed up as soon as possible.  Feel free to call the clinic should you have any questions or concerns. The clinic phone number is [phone].  Please show the [facility] at check-in to the Emergency Department and triage nurse.  Nanoparticle Albumin-Bound Paclitaxel injection What is this medicine? NANOPARTICLE ALBUMIN-BOUND PACLITAXEL (Na no PAHR ti kuhl al BYOO muhn-bound PAK li TAX el) is a chemotherapy drug. It targets fast dividing cells, like cancer cells, and causes these cells to die. This medicine is used to treat advanced breast cancer, lung cancer, and pancreatic cancer. This medicine may be used for other purposes; ask your health care provider or pharmacist if you have questions. COMMON BRAND NAME(S): Abraxane What should I tell my health care provider before I take this medicine? They need to know if you have any of these conditions:  kidney disease  liver disease  low blood counts, like low white cell, platelet, or red cell counts  lung or breathing disease, like asthma  tingling of the fingers or toes, or other nerve disorder  an unusual or  allergic reaction to paclitaxel, albumin, other chemotherapy, other medicines, foods, dyes, or preservatives  pregnant or trying to get pregnant  breast-feeding How should I use this medicine? This drug is given as an infusion into a vein. It is administered in a hospital or clinic by a specially trained health care professional. Talk to your pediatrician regarding the use of this medicine in children. Special care may be needed. Overdosage: If you think you have taken too much of this medicine contact a poison control center or emergency room at once. NOTE: This medicine is only for you. Do not share this medicine with others. What if I miss a dose? It is important not to miss your dose. Call your doctor or health care professional if you are unable to keep an appointment. What may interact with this medicine? This medicine may interact with the following medications:  antiviral medicines for hepatitis, HIV or AIDS  certain antibiotics like erythromycin and clarithromycin  certain medicines for fungal infections like ketoconazole and itraconazole  certain medicines for seizures like carbamazepine, phenobarbital, phenytoin  gemfibrozil  nefazodone  rifampin  St. John's wort This list may not describe all possible interactions. Give your health care provider a list of all the medicines, herbs, non-prescription drugs, or dietary supplements you use. Also tell them if you smoke, drink alcohol, or use illegal drugs. Some items may interact with your medicine. What should I watch for while using this medicine? Your condition will be monitored carefully while you are receiving this medicine. You will need important blood work done while you are taking this medicine. This medicine can cause  serious allergic reactions. If you experience allergic reactions like skin rash, itching or hives, swelling of the face, lips, or tongue, tell your doctor or health care professional right away. In some  cases, you may be given additional medicines to help with side effects. Follow all directions for their use. This drug may make you feel generally unwell. This is not uncommon, as chemotherapy can affect healthy cells as well as cancer cells. Report any side effects. Continue your course of treatment even though you feel ill unless your doctor tells you to stop. Call your doctor or health care professional for advice if you get a fever, chills or sore throat, or other symptoms of a cold or flu. Do not treat yourself. This drug decreases your body's ability to fight infections. Try to avoid being around people who are sick. This medicine may increase your risk to bruise or bleed. Call your doctor or health care professional if you notice any unusual bleeding. Be careful brushing and flossing your teeth or using a toothpick because you may get an infection or bleed more easily. If you have any dental work done, tell your dentist you are receiving this medicine. Avoid taking products that contain aspirin, acetaminophen, ibuprofen, naproxen, or ketoprofen unless instructed by your doctor. These medicines may hide a fever. Do not become pregnant while taking this medicine or for 6 months after stopping it. Women should inform their doctor if they wish to become pregnant or think they might be pregnant. Men should not father a child while taking this medicine or for 3 months after stopping it. There is a potential for serious side effects to an unborn child. Talk to your health care professional or pharmacist for more information. Do not breast-feed an infant while taking this medicine or for 2 weeks after stopping it. This medicine may interfere with the ability to get pregnant or to father a child. You should talk to your doctor or health care professional if you are concerned about your fertility. What side effects may I notice from receiving this medicine? Side effects that you should report to your doctor  or health care professional as soon as possible:  allergic reactions like skin rash, itching or hives, swelling of the face, lips, or tongue  breathing problems  changes in vision  fast, irregular heartbeat  low blood pressure  mouth sores  pain, tingling, numbness in the hands or feet  signs of decreased platelets or bleeding - bruising, pinpoint red spots on the skin, black, tarry stools, blood in the urine  signs of decreased red blood cells - unusually weak or tired, feeling faint or lightheaded, falls  signs of infection - fever or chills, cough, sore throat, pain or difficulty passing urine  signs and symptoms of liver injury like dark yellow or brown urine; general ill feeling or flu-like symptoms; light-colored stools; loss of appetite; nausea; right upper belly pain; unusually weak or tired; yellowing of the eyes or skin  swelling of the ankles, feet, hands  unusually slow heartbeat Side effects that usually do not require medical attention (report to your doctor or health care professional if they continue or are bothersome):  diarrhea  hair loss  loss of appetite  nausea, vomiting  tiredness This list may not describe all possible side effects. Call your doctor for medical advice about side effects. You may report side effects to FDA at 1-800-FDA-1088. Where should I keep my medicine? This drug is given in a hospital or clinic and  will not be stored at home. NOTE: This sheet is a summary. It may not cover all possible information. If you have questions about this medicine, talk to your doctor, pharmacist, or health care provider.  2020 Elsevier/Gold Standard (2017-03-02 13:03:45)

## 2019-08-04 NOTE — Patient Instructions (Signed)

## 2019-08-04 NOTE — Progress Notes (Signed)
[facility] OFFICE PROGRESS NOTE  Wenda Low, MD [facility] [employer] [address] [id number]  Patient Care Team: Wenda Low, MD as PCP - General (Internal Medicine) Rockwell Germany, RN as Oncology Nurse Navigator Mauro Kaufmann, RN as Oncology Nurse Navigator Erroll Luna, MD as Consulting Physician (General Surgery) Truitt Merle, MD as Consulting Physician (Hematology) Gery Pray, MD as Consulting Physician (Radiation Oncology)  Date of Service:  06/30/2019  CHIEF COMPLAINT: F/u of left breast cancer  SUMMARY OF ONCOLOGIC HISTORY:     Oncology History Overview Note   Cancer Staging Malignant neoplasm of upper-inner quadrant of left breast in female, estrogen receptor positive ([facility]) Staging form: Breast, AJCC 8th Edition - Clinical stage from 03/22/2019: Stage IA (cT1c, cN0, cM0, G3, ER+, PR+, HER2+) - Signed by Truitt Merle, MD on 03/22/2019    Malignant neoplasm of upper-inner quadrant of left breast in female, estrogen receptor positive ([place] [facility])   03/10/2019 Mammogram    Diagnostic Mammogram 03/10/19  IMPRESSION: 1. Suspicious mass in the 10 o'clock position of the left breast, 3cm from the nipple measuring 12x7x56m. Biopsy is indicated. 2. Indeterminate mass in the 5:30 o'clock position of the left breast, 5cm from the nipple, measuring 9x6x777m Although this may reflect a fibroadenoma, tissue sampling is recommended.    03/16/2019 Initial Biopsy    Diagnosis 03/16/19  1. 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 SEE NOTE 2. Breast, left, needle core biopsy, 5:30 o'clock - FIBROADENOMA    03/16/2019 Receptors her2    Results: IMMUNOHISTOCHEMICAL AND MORPHOMETRIC ANALYSIS PERFORMED MANUALLY The tumor cells are POSITIVE for Her2 (3+). Estrogen Receptor: 100%, POSITIVE, STRONG STAINING INTENSITY Progesterone Receptor: 100%, POSITIVE, STRONG STAINING INTENSITY Proliferation Marker Ki67: 60%     03/21/2019 Initial Diagnosis    Malignant neoplasm of upper-inner quadrant of left breast in female, estrogen receptor positive ([facility]   03/22/2019 Cancer Staging    Staging form: Breast, AJCC 8th Edition - Clinical stage from 03/22/2019: Stage IA (cT1c, cN0, cM0, G3, ER+, PR+, HER2+) - Signed by FeTruitt MerleMD on 03/22/2019    03/31/2019 Genetic Testing    VUS in CTNNA1 called c.[id number]>T (Intronic) was identified on the [employer] Common Hereditary Cancers Panel. The Common Hereditary Cancers Panel offered by [employer] includes sequencing and/or deletion duplication testing of the following 47 genes: APC, ATM, AXIN2, BARD1, BMPR1A, BRCA1, BRCA2, BRIP1, CDH1, CDKN2A (p14ARF), CDKN2A (p16INK4a), CKD4, CHEK2, CTNNA1, DICER1, EPCAM (Deletion/duplication testing only), GREM1 (promoter region deletion/duplication testing only), KIT, MEN1, MLH1, MSH2, MSH3, MSH6, MUTYH, NBN, NF1, NHTL1, PALB2, PDGFRA, PMS2, POLD1, POLE, PTEN, RAD50, RAD51C, RAD51D, SDHB, SDHC, SDHD, SMAD4, SMARCA4. STK11, TP53, TSC1, TSC2, and VHL.  The following genes were evaluated for sequence changes only: SDHA and HOXB13 c.251G>A variant only. The report date is 03/30/2019.     03/31/2019 Breast MRI    MRI breast 03/31/19  IMPRESSION: 1. 5 millimeter enhancing oval mass in the anteromedial aspect of the RIGHT breast warranting further evaluation. Given its anterior depth and visibility on noncontrast images, ultrasound is recommended to determine if there is a sonographic correlate. If no sonographic correlate is identified to guide biopsy, MRI biopsy of the RIGHT breast is recommended. 2. Known malignancy in the UPPER INNER QUADRANT of the LEFT breast measuring 1.0 centimeters. 3. 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    04/04/2019 Surgery    RADIOCATIVE SEED GUIDED LEFT BREAST LUMPECTOMY, LEFT AXILLARY SENTINEL LYMPH  NODE BIOPSY and PAC placement  By Dr. Brantley Stage  04/04/19     04/04/2019  Pathology Results    DIAGNOSIS: 04/04/19  A. BREAST, LEFT, LUMPECTOMY:  - Invasive ductal carcinoma, Nottingham grade 3 of 3, 1.3 cm  - Ductal carcinoma in-situ, intermediate grade  - Margins uninvolved by carcinoma (0.1 cm; posterior margin; see  comment)  - Previous biopsy site changes present  - See oncology table and comment below   B. BREAST, LEFT, ADDITIONAL MEDIAL MARGIN, EXCISION:  - Fibrocystic changes  - No residual carcinoma identified   C. LYMPH NODE, LEFT AXILLARY, SENTINEL, BIOPSY:  -  No carcinoma identified in one lymph node (0/1)     04/04/2019 Cancer Staging    Staging form: Breast, AJCC 8th Edition - Pathologic stage from 04/04/2019: Stage IA (pT1c, pN0, cM0, G3, ER+, PR+, HER2+) - Signed by Truitt Merle, MD on 04/21/2019    04/11/2019 Pathology Results    Diagnosis Breast, right, needle core biopsy, 2 o'clock - SMALL INTRADUCTAL PAPILLOMA. SEE NOTE - SMALL FIBROADENOMA - NEGATIVE FOR CARCINOMA    05/12/2019 -  Chemotherapy    adjuvant Weekly Taxol and Herceptin for 12 weeks starting 05/12/19. Followed by maintenance Herceptin q3weeks to complete 1 year treatment.      CURRENT THERAPY: adjuvant Weekly Taxol and Herceptin for 12 weeksstarting 05/12/19. S/P 10 weekly doses. Starting from her 11th weekly dose, Taxol was changed to Abraxane secondary to a rash. Followed by maintenance Herceptin q3weeks to complete 1 year treatment expected to start 09/01/2019.   INTERVAL HISTORY: Chelsea Hobbs 49 y.o. female returns to the clinic for a follow up visit. The patient's treatment has been on hold for the last 3 weeks secondary to being diagnosed with COVID-19 on 07/17/2019. She had experienced congestion, fatigue, myalgias, and a dry cough. She had a repeat COVID-19 test performed recently which was negative. She feels better at this time and is ready to resume her treatment. She had to reschedule her routine echocardiogram secondary to her recent  illness. She has rescheduled this for 08/13/2018. Today, she feels well and denies any fevers, chills, night sweats, or weight loss. She denies any chest pain, shortness of breath, cough, or hemoptysis. She denies any nausea or vomiting. She has IBS so frequently struggles with her bowels for which she takes mesalamine. She denies any peripheral neuropathy. She denies any lymphadenopathy. Her rash on her scalp has resolved at this time which was thought to be secondary to Taxol. Her taxol has been changed to Abraxane. She is here for evaluation before resuming treatment with her 11th weekly dose of Abraxane and Herceptin.   MEDICAL HISTORY: Past Medical History:  Diagnosis Date  . Asthma   . Family history of breast cancer   . Family history of lung cancer     ALLERGIES:  is allergic to doxycycline and latex.  MEDICATIONS:  Current Outpatient Medications  Medication Sig Dispense Refill  . albuterol (PROVENTIL HFA;VENTOLIN HFA) 108 (90 BASE) MCG/ACT inhaler Inhale 2 puffs into the lungs every 6 (six) hours as needed. For shortness of breath    . ALPRAZolam (XANAX) 1 MG tablet Take 0.5 mg by mouth 2 (two) times daily as needed.    . Ascorbic Acid (VITAMIN C) 1000 MG tablet Take 1,000 mg by mouth daily.    . cholecalciferol (VITAMIN D3) 25 MCG (1000 UT) tablet Take 2,000 Units by mouth daily.     . clindamycin-benzoyl peroxide (BENZACLIN) gel Apply topically 2 (two) times daily. 25  g 0  . fexofenadine (ALLEGRA) 60 MG tablet Take 60 mg by mouth 2 (two) times daily.    . fluticasone (FLONASE) 50 MCG/ACT nasal spray Place into both nostrils daily.    Marland Kitchen lidocaine-prilocaine (EMLA) cream Apply to affected area once 30 g 3  . omeprazole (PRILOSEC) 20 MG capsule Take 1 capsule (20 mg total) by mouth daily. 30 capsule 2  . ondansetron (ZOFRAN) 8 MG tablet Take 1 tablet (8 mg total) by mouth 2 (two) times daily as needed (Nausea or vomiting). 30 tablet 1  . prochlorperazine (COMPAZINE) 10 MG tablet Take 1  tablet (10 mg total) by mouth every 6 (six) hours as needed (Nausea or vomiting). 30 tablet 1  . SUMAtriptan (IMITREX) 100 MG tablet Take 100 mg by mouth.     No current facility-administered medications for this visit.   Facility-Administered Medications Ordered in Other Visits  Medication Dose Route Frequency Provider Last Rate Last Admin  . sodium chloride flush (NS) 0.9 % injection 10 mL  10 mL Intracatheter PRN Truitt Merle, MD   10 mL at 08/04/19 1610    SURGICAL HISTORY:  Past Surgical History:  Procedure Laterality Date  . BREAST BIOPSY Right   . BREAST LUMPECTOMY WITH RADIOACTIVE SEED AND SENTINEL LYMPH NODE BIOPSY Left 04/04/2019   Procedure: RADIOCATIVE SEED GUIDED LEFT BREAST LUMPECTOMY, LEFT AXILLARY SENTINEL LYMPH NODE BIOPSY;  Surgeon: Erroll Luna, MD;  Location: [facility];  Service: General;  Laterality: Left;  . NASAL SINUS SURGERY    . PORTACATH PLACEMENT N/A 04/04/2019   Procedure: INSERTION PORT-A-CATH WITH ULTRASOUND;  Surgeon: Erroll Luna, MD;  Location: [facility];  Service: General;  Laterality: N/A;    REVIEW OF SYSTEMS:   Review of Systems  Constitutional: Negative for appetite change, chills, fatigue, fever and unexpected weight change.  HENT:   Negative for mouth sores, nosebleeds, sore throat and trouble swallowing.   Eyes: Negative for eye problems and icterus.  Respiratory: Negative for cough, hemoptysis, shortness of breath and wheezing.  Cardiovascular: Negative for chest pain and leg swelling.  Gastrointestinal: Negative for abdominal pain, constipation, diarrhea, nausea and vomiting.  Genitourinary: Negative for bladder incontinence, difficulty urinating, dysuria, frequency and hematuria.   Musculoskeletal: Negative for back pain, gait problem, neck pain and neck stiffness.  Skin: Negative for itching and rash.  Neurological: Negative for dizziness, extremity weakness, gait problem, headaches, light-headedness and  seizures.  Hematological: Negative for adenopathy. Does not bruise/bleed easily.  Psychiatric/Behavioral: Negative for confusion, depression and sleep disturbance. The patient is not nervous/anxious.     PHYSICAL EXAMINATION:  Blood pressure 120/79, pulse 80, temperature 97.8 F (36.6 C), temperature source Temporal, resp. rate 18, height _0  (1.575 m), weight 123 lb 8 oz (56 kg), SpO2 100 %.  ECOG PERFORMANCE STATUS: 1 - Symptomatic but completely ambulatory  Physical Exam  GENERAL:alert, no distress and comfortable SKIN: skin color, texture, turgor are normal (+) Improved skin rash of scalp EYES: normal, Conjunctiva are pink and non-injected, sclera clear  NECK: supple, thyroid normal size, non-tender, without nodularity LYMPH:  no palpable lymphadenopathy in the cervical, axillary  LUNGS: clear to auscultation and percussion with normal breathing effort HEART: regular rate & rhythm and no murmurs and no lower extremity edema ABDOMEN:abdomen soft, non-tender and normal bowel sounds Musculoskeletal:no cyanosis of digits and no clubbing  NEURO: alert & oriented x 3 with fluent speech, no focal motor/sensory deficits  LABORATORY DATA: Lab Results  Component Value Date   WBC  9.2 08/04/2019   HGB 11.5 (L) 08/04/2019   HCT 34.1 (L) 08/04/2019   MCV 93.2 08/04/2019   PLT 363 08/04/2019      Chemistry      Component Value Date/Time   NA 142 08/04/2019 1315   K 4.3 08/04/2019 1315   CL 107 08/04/2019 1315   CO2 25 08/04/2019 1315   BUN 16 08/04/2019 1315   CREATININE 0.68 08/04/2019 1315      Component Value Date/Time   CALCIUM 9.1 08/04/2019 1315   ALKPHOS 68 08/04/2019 1315   AST 23 08/04/2019 1315   ALT 37 08/04/2019 1315   BILITOT 0.3 08/04/2019 1315       RADIOGRAPHIC STUDIES:  No results found.   ASSESSMENT/PLAN:  ASSESSMENT & PLAN:  Chelise Hanger Hodapp is a 49 y.o. 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 She underwent left breastlumpectomyand SLNB on 04/04/19.Her DCIS margins were close (68m). -Her breast MRI from 03/31/19 showed known left breast cancer and 1.559menhancing right breast mass. Her right breast biopsy from 04/11/19 showed small intraductal papilloma which is benign.Dr. CoBrantley Stageecommendedlumpectomy to remove this in the future. -Will discuss her option of b/l mastectomy orrightlumpectomy after chemo treatment. -Given her 1.3cm tumor, node negative disease started her on weekly Taxoland anti-Her2 Herceptinfor 12 weekson 05/12/19.This will be followed byHerceptinmaintenanceq3weeks to complete 1 year of treatment. Maintenance Herceptin expected to start 09/01/2019 -S/p week 10 she tolerated well with very mild side effects and skin rash of scalp, chest, back. Has improved. Taxol has been changed to Abraxane. -Labs reviewed, stable anemia. Overall adequate to proceed with week 11 of Abraxane and Trastuzumab today. I encouraged her to watch for neuropathy. She will continue ice bags with infusion. Has not had neuropathy at this time  -Will receive last weekly dose of Abraxane and Trastuzumab next week if labs adequate for treatment.   2. Bone Health -Per pt when she had her IUD removedin 03/2019. Her repeated hormonal labs show she is not postmenopausal. She still has periods. Will retest in 1 year and hold on baseline DEXA scan for now.  3. Genetic Testingwas negative for pathogenetic nutations  4. Anxiety  -Previously on Xanaxand Effexor. -Managed by her PCP -She will f/u with SW office as needed about her finances and stress.  5. Colitis  -She has colitis and uses Mesalamine Suppositories. Will monitor on chemo. -no diarrhea lately  6. Skin rash  -Likely secondary to Taxol. As resolved since delaying her treatment secondary to recent COVID-19 infection.  -On scalp, head is  shaved clean, chest and back -She will continue Claritin, OTC hydrocortisone cream and clindamycin. -Taxol has been changed to Abraxane.  7. Insomnia  -Was recommended by Dr. FeBurr Medicoo use Melatonin or benadryl.    PLAN: -Labs reviewed and adequate to proceed with week 11 Abraxane andTrastuzumabtoday as scheduled.  -Will return on 1/29 for her last weekly dose of Abraxane and Trastuzumab as scheduled -Patient is scheduled for her echocardiogram on 08/14/2019 -Will provide patient with work note secondary to recent COVID-19 infection.  -Will see her for a follow up visit on 09/01/2019 for labs, flush, return visit, and her first dose of maintenance Trastuzumab  No orders of the defined types were placed in this encounter.    CaUlyssesPA-C 08/04/19

## 2019-08-07 ENCOUNTER — Telehealth: Payer: Self-pay

## 2019-08-07 NOTE — Telephone Encounter (Signed)
Chelsea Hobbs called asking when her radiation treatments would start.  I told her 1 month after her last chemotherapy treatment.  I told her she would continue with trastuzumab during radiation.  She verbalized understanding.

## 2019-08-11 ENCOUNTER — Other Ambulatory Visit: Payer: Self-pay

## 2019-08-11 ENCOUNTER — Telehealth: Payer: Self-pay | Admitting: Hematology

## 2019-08-11 ENCOUNTER — Inpatient Hospital Stay: Payer: BC Managed Care – PPO

## 2019-08-11 ENCOUNTER — Other Ambulatory Visit: Payer: Self-pay | Admitting: *Deleted

## 2019-08-11 VITALS — BP 120/84 | HR 76 | Temp 97.8°F | Resp 18

## 2019-08-11 DIAGNOSIS — Z170 Estrogen receptor positive status [ER+]: Secondary | ICD-10-CM

## 2019-08-11 DIAGNOSIS — C50212 Malignant neoplasm of upper-inner quadrant of left female breast: Secondary | ICD-10-CM | POA: Diagnosis not present

## 2019-08-11 DIAGNOSIS — Z5111 Encounter for antineoplastic chemotherapy: Secondary | ICD-10-CM | POA: Diagnosis not present

## 2019-08-11 DIAGNOSIS — D649 Anemia, unspecified: Secondary | ICD-10-CM | POA: Diagnosis not present

## 2019-08-11 DIAGNOSIS — Z8616 Personal history of COVID-19: Secondary | ICD-10-CM | POA: Diagnosis not present

## 2019-08-11 DIAGNOSIS — F419 Anxiety disorder, unspecified: Secondary | ICD-10-CM | POA: Diagnosis not present

## 2019-08-11 DIAGNOSIS — Z79899 Other long term (current) drug therapy: Secondary | ICD-10-CM | POA: Diagnosis not present

## 2019-08-11 DIAGNOSIS — K529 Noninfective gastroenteritis and colitis, unspecified: Secondary | ICD-10-CM | POA: Diagnosis not present

## 2019-08-11 DIAGNOSIS — Z95828 Presence of other vascular implants and grafts: Secondary | ICD-10-CM

## 2019-08-11 LAB — CMP (CANCER CENTER ONLY)
ALT: 38 U/L (ref 0–44)
AST: 23 U/L (ref 15–41)
Albumin: 4.1 g/dL (ref 3.5–5.0)
Alkaline Phosphatase: 63 U/L (ref 38–126)
Anion gap: 9 (ref 5–15)
BUN: 15 mg/dL (ref 6–20)
CO2: 25 mmol/L (ref 22–32)
Calcium: 8.9 mg/dL (ref 8.9–10.3)
Chloride: 106 mmol/L (ref 98–111)
Creatinine: 0.69 mg/dL (ref 0.44–1.00)
GFR, Est AFR Am: >60 mL/min (ref >60)
GFR, Estimated: >60 mL/min (ref >60)
Glucose, Bld: 100 mg/dL — ABNORMAL HIGH (ref 70–99)
Potassium: 4 mmol/L (ref 3.5–5.1)
Sodium: 140 mmol/L (ref 135–145)
Total Bilirubin: 0.3 mg/dL (ref 0.3–1.2)
Total Protein: 6.7 g/dL (ref 6.5–8.1)

## 2019-08-11 LAB — CBC WITH DIFFERENTIAL (CANCER CENTER ONLY)
Abs Immature Granulocytes: 0.03 10*3/uL (ref 0.00–0.07)
Basophils Absolute: 0.1 10*3/uL (ref 0.0–0.1)
Basophils Relative: 1 %
Eosinophils Absolute: 0.4 10*3/uL (ref 0.0–0.5)
Eosinophils Relative: 7 %
HCT: 30.8 % — ABNORMAL LOW (ref 36.0–46.0)
Hemoglobin: 10.4 g/dL — ABNORMAL LOW (ref 12.0–15.0)
Immature Granulocytes: 1 %
Lymphocytes Relative: 45 %
Lymphs Abs: 2.6 10*3/uL (ref 0.7–4.0)
MCH: 31 pg (ref 26.0–34.0)
MCHC: 33.8 g/dL (ref 30.0–36.0)
MCV: 91.9 fL (ref 80.0–100.0)
Monocytes Absolute: 0.4 10*3/uL (ref 0.1–1.0)
Monocytes Relative: 7 %
Neutro Abs: 2.3 10*3/uL (ref 1.7–7.7)
Neutrophils Relative %: 39 %
Platelet Count: 282 10*3/uL (ref 150–400)
RBC: 3.35 MIL/uL — ABNORMAL LOW (ref 3.87–5.11)
RDW: 13 % (ref 11.5–15.5)
WBC Count: 5.8 10*3/uL (ref 4.0–10.5)
nRBC: 0 % (ref 0.0–0.2)

## 2019-08-11 MED ORDER — DIPHENHYDRAMINE HCL 50 MG/ML IJ SOLN
50.0000 mg | Freq: Once | INTRAMUSCULAR | Status: AC
  Administered 2019-08-11: 50 mg via INTRAVENOUS

## 2019-08-11 MED ORDER — SODIUM CHLORIDE 0.9 % IV SOLN
Freq: Once | INTRAVENOUS | Status: AC
  Filled 2019-08-11: qty 250

## 2019-08-11 MED ORDER — ACETAMINOPHEN 325 MG PO TABS
ORAL_TABLET | ORAL | Status: AC
  Filled 2019-08-11: qty 2

## 2019-08-11 MED ORDER — ACETAMINOPHEN 325 MG PO TABS
650.0000 mg | ORAL_TABLET | Freq: Once | ORAL | Status: AC
  Administered 2019-08-11: 650 mg via ORAL

## 2019-08-11 MED ORDER — DIPHENHYDRAMINE HCL 50 MG/ML IJ SOLN
INTRAMUSCULAR | Status: AC
  Filled 2019-08-11: qty 1

## 2019-08-11 MED ORDER — ONDANSETRON HCL 4 MG/2ML IJ SOLN
INTRAMUSCULAR | Status: AC
  Filled 2019-08-11: qty 4

## 2019-08-11 MED ORDER — SODIUM CHLORIDE 0.9% FLUSH
10.0000 mL | INTRAVENOUS | Status: DC | PRN
  Administered 2019-08-11: 10 mL
  Filled 2019-08-11: qty 10

## 2019-08-11 MED ORDER — HEPARIN SOD (PORK) LOCK FLUSH 100 UNIT/ML IV SOLN
500.0000 [IU] | Freq: Once | INTRAVENOUS | Status: AC | PRN
  Administered 2019-08-11: 500 [IU]
  Filled 2019-08-11: qty 5

## 2019-08-11 MED ORDER — ONDANSETRON HCL 4 MG/2ML IJ SOLN
8.0000 mg | Freq: Once | INTRAMUSCULAR | Status: AC
  Administered 2019-08-11: 8 mg via INTRAVENOUS

## 2019-08-11 MED ORDER — TRASTUZUMAB-DKST CHEMO 150 MG IV SOLR
6.0000 mg/kg | Freq: Once | INTRAVENOUS | Status: AC
  Administered 2019-08-11: 336 mg via INTRAVENOUS
  Filled 2019-08-11: qty 16

## 2019-08-11 MED ORDER — PACLITAXEL PROTEIN-BOUND CHEMO INJECTION 100 MG
80.0000 mg/m2 | Freq: Once | INTRAVENOUS | Status: AC
  Administered 2019-08-11: 125 mg via INTRAVENOUS
  Filled 2019-08-11: qty 25

## 2019-08-11 NOTE — Telephone Encounter (Signed)
Patient called to reschedule all future appointments, due to patient's work schedule appointment needs to be moved on Mondays. Per patient's request all future appointments have been rescheduled.

## 2019-08-11 NOTE — Patient Instructions (Signed)
[facility] Discharge Instructions for Patients Receiving Chemotherapy  Today you received the following chemotherapy agents: Abraxane, Herceptin.   To help prevent nausea and vomiting after your treatment, we encourage you to take your nausea medication as directed.   If you develop nausea and vomiting that is not controlled by your nausea medication, call the clinic.   BELOW ARE SYMPTOMS THAT SHOULD BE REPORTED IMMEDIATELY:  *FEVER GREATER THAN 100.5 F  *CHILLS WITH OR WITHOUT FEVER  NAUSEA AND VOMITING THAT IS NOT CONTROLLED WITH YOUR NAUSEA MEDICATION  *UNUSUAL SHORTNESS OF BREATH  *UNUSUAL BRUISING OR BLEEDING  TENDERNESS IN MOUTH AND THROAT WITH OR WITHOUT PRESENCE OF ULCERS  *URINARY PROBLEMS  *BOWEL PROBLEMS  UNUSUAL RASH Items with * indicate a potential emergency and should be followed up as soon as possible.  Feel free to call the clinic should you have any questions or concerns. The clinic phone number is [phone].  Please show the [facility] at check-in to the Emergency Department and triage nurse.  Nanoparticle Albumin-Bound Paclitaxel injection What is this medicine? NANOPARTICLE ALBUMIN-BOUND PACLITAXEL (Na no PAHR ti kuhl al BYOO muhn-bound PAK li TAX el) is a chemotherapy drug. It targets fast dividing cells, like cancer cells, and causes these cells to die. This medicine is used to treat advanced breast cancer, lung cancer, and pancreatic cancer. This medicine may be used for other purposes; ask your health care provider or pharmacist if you have questions. COMMON BRAND NAME(S): Abraxane What should I tell my health care provider before I take this medicine? They need to know if you have any of these conditions:  kidney disease  liver disease  low blood counts, like low white cell, platelet, or red cell counts  lung or breathing disease, like asthma  tingling of the fingers or toes, or other nerve disorder  an unusual or  allergic reaction to paclitaxel, albumin, other chemotherapy, other medicines, foods, dyes, or preservatives  pregnant or trying to get pregnant  breast-feeding How should I use this medicine? This drug is given as an infusion into a vein. It is administered in a hospital or clinic by a specially trained health care professional. Talk to your pediatrician regarding the use of this medicine in children. Special care may be needed. Overdosage: If you think you have taken too much of this medicine contact a poison control center or emergency room at once. NOTE: This medicine is only for you. Do not share this medicine with others. What if I miss a dose? It is important not to miss your dose. Call your doctor or health care professional if you are unable to keep an appointment. What may interact with this medicine? This medicine may interact with the following medications:  antiviral medicines for hepatitis, HIV or AIDS  certain antibiotics like erythromycin and clarithromycin  certain medicines for fungal infections like ketoconazole and itraconazole  certain medicines for seizures like carbamazepine, phenobarbital, phenytoin  gemfibrozil  nefazodone  rifampin  St. John's wort This list may not describe all possible interactions. Give your health care provider a list of all the medicines, herbs, non-prescription drugs, or dietary supplements you use. Also tell them if you smoke, drink alcohol, or use illegal drugs. Some items may interact with your medicine. What should I watch for while using this medicine? Your condition will be monitored carefully while you are receiving this medicine. You will need important blood work done while you are taking this medicine. This medicine can cause  serious allergic reactions. If you experience allergic reactions like skin rash, itching or hives, swelling of the face, lips, or tongue, tell your doctor or health care professional right away. In some  cases, you may be given additional medicines to help with side effects. Follow all directions for their use. This drug may make you feel generally unwell. This is not uncommon, as chemotherapy can affect healthy cells as well as cancer cells. Report any side effects. Continue your course of treatment even though you feel ill unless your doctor tells you to stop. Call your doctor or health care professional for advice if you get a fever, chills or sore throat, or other symptoms of a cold or flu. Do not treat yourself. This drug decreases your body's ability to fight infections. Try to avoid being around people who are sick. This medicine may increase your risk to bruise or bleed. Call your doctor or health care professional if you notice any unusual bleeding. Be careful brushing and flossing your teeth or using a toothpick because you may get an infection or bleed more easily. If you have any dental work done, tell your dentist you are receiving this medicine. Avoid taking products that contain aspirin, acetaminophen, ibuprofen, naproxen, or ketoprofen unless instructed by your doctor. These medicines may hide a fever. Do not become pregnant while taking this medicine or for 6 months after stopping it. Women should inform their doctor if they wish to become pregnant or think they might be pregnant. Men should not father a child while taking this medicine or for 3 months after stopping it. There is a potential for serious side effects to an unborn child. Talk to your health care professional or pharmacist for more information. Do not breast-feed an infant while taking this medicine or for 2 weeks after stopping it. This medicine may interfere with the ability to get pregnant or to father a child. You should talk to your doctor or health care professional if you are concerned about your fertility. What side effects may I notice from receiving this medicine? Side effects that you should report to your doctor  or health care professional as soon as possible:  allergic reactions like skin rash, itching or hives, swelling of the face, lips, or tongue  breathing problems  changes in vision  fast, irregular heartbeat  low blood pressure  mouth sores  pain, tingling, numbness in the hands or feet  signs of decreased platelets or bleeding - bruising, pinpoint red spots on the skin, black, tarry stools, blood in the urine  signs of decreased red blood cells - unusually weak or tired, feeling faint or lightheaded, falls  signs of infection - fever or chills, cough, sore throat, pain or difficulty passing urine  signs and symptoms of liver injury like dark yellow or brown urine; general ill feeling or flu-like symptoms; light-colored stools; loss of appetite; nausea; right upper belly pain; unusually weak or tired; yellowing of the eyes or skin  swelling of the ankles, feet, hands  unusually slow heartbeat Side effects that usually do not require medical attention (report to your doctor or health care professional if they continue or are bothersome):  diarrhea  hair loss  loss of appetite  nausea, vomiting  tiredness This list may not describe all possible side effects. Call your doctor for medical advice about side effects. You may report side effects to FDA at 1-800-FDA-1088. Where should I keep my medicine? This drug is given in a hospital or clinic and  will not be stored at home. NOTE: This sheet is a summary. It may not cover all possible information. If you have questions about this medicine, talk to your doctor, pharmacist, or health care provider.  2020 Elsevier/Gold Standard (2017-03-02 13:03:45)

## 2019-08-14 ENCOUNTER — Ambulatory Visit (HOSPITAL_COMMUNITY)
Admission: RE | Admit: 2019-08-14 | Discharge: 2019-08-14 | Disposition: A | Discharge status: Home / Self Care | Payer: BC Managed Care – PPO | Source: Ambulatory Visit | Attending: Hematology | Admitting: Hematology

## 2019-08-14 ENCOUNTER — Other Ambulatory Visit: Payer: Self-pay

## 2019-08-14 DIAGNOSIS — Z170 Estrogen receptor positive status [ER+]: Secondary | ICD-10-CM

## 2019-08-14 DIAGNOSIS — C50212 Malignant neoplasm of upper-inner quadrant of left female breast: Secondary | ICD-10-CM | POA: Diagnosis not present

## 2019-08-14 NOTE — Progress Notes (Signed)
  Echocardiogram 2D Echocardiogram has been performed.  Jannett Celestine 08/14/2019, 10:51 AM

## 2019-08-17 ENCOUNTER — Encounter: Payer: Self-pay | Admitting: *Deleted

## 2019-08-17 NOTE — Progress Notes (Signed)
Location of Breast Cancer: Malignant neoplasm of upper-inner quadrant of left breast in female, estrogen receptor positive ([place])   Histology per Pathology Report: 04/04/19: DIAGNOSIS:   A. BREAST, LEFT, LUMPECTOMY:  - Invasive ductal carcinoma, Nottingham grade 3 of 3, 1.3 cm  - Ductal carcinoma in-situ, intermediate grade  - Margins uninvolved by carcinoma (0.1 cm; posterior margin; see  comment)  - Previous biopsy site changes present  - See oncology table and comment below   B. BREAST, LEFT, ADDITIONAL MEDIAL MARGIN, EXCISION:  - Fibrocystic changes  - No residual carcinoma identified   C. LYMPH NODE, LEFT AXILLARY, SENTINEL, BIOPSY:  -  No carcinoma identified in one lymph node (0/1)   Receptor Status: ER(100%), PR (100%), Her2-neu (Positive), Ki-(60%)  Did patient present with symptoms (if so, please note symptoms) or was this found on screening mammography?: She had routine screening mammography on 03/06/2019 showing a possible abnormality in the left breast. She underwent left diagnostic mammography with tomography ultrasonography at [facility] on 03/10/2019 showing: suspicious 12 mm mass at 10 o'clock in the left breast; indeterminate 9 mm mass at 5:30 in the left breast.   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 She underwent left breastlumpectomyand SLNB on 04/04/19.Her DCIS margins were close (64m). -Her  breast MRI from 03/31/19 showed known left breast cancer and 1.585menhancing right breast mass. Her right breast biopsy from 04/11/19 showed small intraductal papilloma which is benign.Dr. CoBrantley Stageecommendedlumpectomy to remove this in the future. -Will discuss her option of b/l mastectomy orrightlumpectomy after chemo treatment. -Given her 1.3cm tumor, node negative disease started her on weekly Taxoland anti-Her2 Herceptinfor 12 weekson 05/12/19.This will be followed byHerceptinmaintenanceq3weeks to complete 1 year of treatment. Maintenance Herceptin expected to start 09/01/2019 -S/p week 10she tolerated well with very mild side effects and skin rash of scalp, chest, back. Has improved. Taxol has been changed to Abraxane. -Labs reviewed, stable anemia. Overall adequate to proceed with week 11 of Abraxane andTrastuzumabtoday. I encouraged her to watch for neuropathy. She will continue ice bags with infusion. Has not had neuropathy at this time  -Will receive last weekly dose of Abraxane and Trastuzumab next week if labs adequate for treatment.   2. Bone Health -Per pt when she had her IUD removedin 03/2019. Her repeated hormonal labs show she is not postmenopausal. She still has periods. Will retest in 1 year and hold on baseline DEXA scan for now.  3. Genetic Testingwas negative for pathogenetic nutations  4. Anxiety  -Previously on Xanaxand Effexor. -Managed by her PCP -She will f/u with SW office as needed about her finances and stress.  5. Colitis  -She has colitis and uses Mesalamine Suppositories. Will monitor on chemo. -no diarrhea lately  6. Skin rash  -Likely secondary to Taxol. As resolved since delaying her treatment secondary to recent COVID-19 infection.  -On scalp, head is shaved clean, chest and back -She will continueClaritin,OTC hydrocortisone creamand clindamycin. -Taxol has been changed to Abraxane.  7.Insomnia -Was recommended by Dr.  FeBurr Medicoo use Melatonin or benadryl.  PLAN: -Labs reviewed and adequate to proceed with week11Abraxane andTrastuzumabtoday as scheduled.  -Will return on 1/29 for her last weekly dose of Abraxane and Trastuzumab as scheduled -Patient is scheduled for her echocardiogram on 08/14/2019 -Will provide patient with work note secondary to recent COVID-19 infection.  -Will see her for a follow up visit on 09/01/2019 for labs, flush, return visit, and her first dose of maintenance Trastuzumab  No orders of the defined types were placed in this encounter.     Lymphedema issues, if any:  Pt denies any concerns, S/S lymphedema.  Pain issues, if any:  Pt denies c/o pain today.  SAFETY ISSUES:  Prior radiation? no  Pacemaker/ICD? no  Possible current pregnancy?no  Is the patient on methotrexate? no  Current Complaints / other details:  Pt presents today for consult with Dr. Sondra Come for Radiation Oncology. Pt is unaccompanied.   BP 119/71 (BP Location: Right Arm, Patient Position: Sitting, Cuff Size: Normal)   Pulse 73   Temp 98.3 F (36.8 C)   Resp 18   Wt 125 lb 3.2 oz (56.8 kg)   SpO2 100%   BMI 22.90 kg/m   Wt Readings from Last 3 Encounters:  08/21/19 125 lb 3.2 oz (56.8 kg)  08/04/19 123 lb 8 oz (56 kg)  07/13/19 125 lb 8 oz (56.9 kg)       [name], RN 08/21/2019,2:02 PM

## 2019-08-21 ENCOUNTER — Encounter: Payer: Self-pay | Admitting: Radiation Oncology

## 2019-08-21 ENCOUNTER — Other Ambulatory Visit: Payer: Self-pay

## 2019-08-21 ENCOUNTER — Ambulatory Visit
Admission: RE | Admit: 2019-08-21 | Discharge: 2019-08-21 | Disposition: A | Discharge status: Home / Self Care | Payer: BC Managed Care – PPO | Source: Ambulatory Visit | Attending: Radiation Oncology | Admitting: Radiation Oncology

## 2019-08-21 ENCOUNTER — Encounter: Payer: Self-pay | Admitting: *Deleted

## 2019-08-21 VITALS — BP 119/71 | HR 73 | Temp 98.3°F | Resp 18 | Wt 125.2 lb

## 2019-08-21 DIAGNOSIS — Z79899 Other long term (current) drug therapy: Secondary | ICD-10-CM | POA: Diagnosis not present

## 2019-08-21 DIAGNOSIS — C50212 Malignant neoplasm of upper-inner quadrant of left female breast: Secondary | ICD-10-CM

## 2019-08-21 DIAGNOSIS — Z510 Encounter for antineoplastic radiation therapy: Secondary | ICD-10-CM | POA: Insufficient documentation

## 2019-08-21 DIAGNOSIS — D0512 Intraductal carcinoma in situ of left breast: Secondary | ICD-10-CM | POA: Insufficient documentation

## 2019-08-21 DIAGNOSIS — Z170 Estrogen receptor positive status [ER+]: Secondary | ICD-10-CM | POA: Insufficient documentation

## 2019-08-21 DIAGNOSIS — Z923 Personal history of irradiation: Secondary | ICD-10-CM | POA: Insufficient documentation

## 2019-08-21 NOTE — Progress Notes (Signed)
Radiation Oncology         [phone] ________________________________  Name: Chelsea Hobbs MRN: [id number]  Date: 08/21/2019  DOB: January 08, 1971  Re-Evaluation Note  CC: Chelsea Low, MD  Chelsea Merle, MD    ICD-10-CM   1. 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 She was seen in the multidisciplinary breast clinic on 03/22/2019. At that time, the plan was for genetic testing, bilateral breast MRI, left lumpectomy with sentinel node biopsy, adjuvant chemotherapy, adjuvant radiation therapy, and aromatase inhibitor.  Since consultation, she underwent genetic testing on 03/22/2019. One variant of uncertain significance was identified. However, final results were negative.  MRI of bilateral breasts on 03/31/2019 showed a 5 mm enhancing oval mass in the anteromedial aspect of the right breast that warranted further evaluation. It also showed the known malignancy in the upper inner quadrant of the left breast that measured 1.0 cm. Finally, it showed biopsy proven fibroadenoma in the lower outer quadrant of the left breast.  Ultrasound of right breast on 04/11/2019 showed a 4 mm indeterminate mass in the 2 o'clock retroareolar right breast. Pathology results from needle core biopsy of right breast on 04/11/2019 showed small intraductal papilloma and small fibroadenoma; negative for carcinoma. Chelsea Hobbs recommended a lumpectomy to remove this in the future.  She opted to proceed with left breast lumpectomy with left sentinel lymph node biopsy on 04/04/2019 performed by Chelsea Hobbs. Pathology from the procedure revealed grade 3 invasive ductal carcinoma and intermediate grade ductal carcinoma in-situ. Margins  were uninvolved by carcinoma. Additional left medial margin was excised and showed fibrocystic changes without residual carcinoma. One left axillary sentinel lymph node was biopsied and was negative for carcinoma.  The patient began Adjuvant Chemotherapy with weekly Taxol and Herceptin on 05/12/2019 under the care of Chelsea Hobbs. She has been tolerating chemotherapy well but did experience some fatigue and a rash on her scalp, chest, and back. Taxol was changed to Abraxane. The patient's treatment was put on hold for three weeks secondary to being diagnosed with COVID-19 on 07/17/2019. She now feels better, has tested negative for COVID-19, and is continuing her chemotherapy regimen.  Of note, the patient had echocardiograms performed on 04/03/2019 and 08/14/2019, both of which showed an EF of 55-60%.  On review of systems, the patient reports continuing to work full-time. She denies pain within the breast nipple discharge or bleeding and any other symptoms.  She denies any swelling in her left arm or hand.   Allergies:  is allergic to doxycycline and latex.  Meds: Current Outpatient Medications  Medication Sig Dispense Refill  . albuterol (PROVENTIL HFA;VENTOLIN HFA) 108 (90 BASE) MCG/ACT inhaler Inhale 2 puffs into the lungs every 6 (six) hours as needed. For shortness of breath    . ALPRAZolam (XANAX) 1 MG tablet Take 0.5 mg by mouth 2 (two) times daily as needed.    . Ascorbic Acid (VITAMIN C) 1000 MG tablet Take 1,000 mg by mouth daily.    . cholecalciferol (VITAMIN D3) 25 MCG (1000 UT) tablet Take 2,000 Units by mouth daily.     . fexofenadine (ALLEGRA) 60 MG tablet Take 60 mg by mouth 2 (two) times daily.    Marland Kitchen  fluticasone (FLONASE) 50 MCG/ACT nasal spray Place into both nostrils daily.    Marland Kitchen lidocaine-prilocaine (EMLA) cream Apply to affected area once 30 g 3  . omeprazole (PRILOSEC) 20 MG capsule Take 1 capsule (20 mg total) by mouth daily. 30 capsule 2  . ondansetron (ZOFRAN) 8 MG tablet  Take 1 tablet (8 mg total) by mouth 2 (two) times daily as needed (Nausea or vomiting). 30 tablet 1  . prochlorperazine (COMPAZINE) 10 MG tablet Take 1 tablet (10 mg total) by mouth every 6 (six) hours as needed (Nausea or vomiting). 30 tablet 1  . SUMAtriptan (IMITREX) 100 MG tablet Take 100 mg by mouth.    . clindamycin-benzoyl peroxide (BENZACLIN) gel Apply topically 2 (two) times daily. (Patient not taking: Reported on 08/21/2019) 25 g 0   No current facility-administered medications for this encounter.    Physical Findings: The patient is in no acute distress. Patient is alert and oriented.  weight is 125 lb 3.2 oz (56.8 kg). Her temperature is 98.3 F (36.8 C). Her blood pressure is 119/71 and her pulse is 73. Her respiration is 18 and oxygen saturation is 100%.  No significant changes. Lungs are clear to auscultation bilaterally. Heart has regular rate and rhythm. No palpable cervical, supraclavicular, or axillary adenopathy. Abdomen soft, non-tender, normal bowel sounds. Right breast: no palpable mass, nipple discharge or bleeding. Left breast: Well-healed periareolar scar in the upper inner location.  Patient has a second scar in the axillary region.  No palpable mass nipple discharge or bleeding  Lab Findings: Lab Results  Component Value Date   WBC 5.8 08/11/2019   HGB 10.4 (L) 08/11/2019   HCT 30.8 (L) 08/11/2019   MCV 91.9 08/11/2019   PLT 282 08/11/2019    Radiographic Findings: ECHOCARDIOGRAM COMPLETE  Result Date: 08/14/2019   ECHOCARDIOGRAM REPORT   Patient Name:   Chelsea Hobbs Date of Exam: 08/14/2019 Medical Rec #:  [id number]             Height:       62.0 in Accession #:    [id number]            Weight:       123.5 lb Date of Birth:  08/22/1970              BSA:          1.56 m Patient Age:    49 years              BP:           120/84 mmHg Patient Gender: F                     HR:           95 bpm. Exam Location:  Outpatient Procedure: 2D Echo Indications:     chemotherapy evaluation V67.2  History:        Patient has prior history of Echocardiogram examinations, most                 recent 04/03/2019. Breast cancer.  Sonographer:    Jannett Celestine RDCS (AE) Referring Phys: [id number] [place]  1. Left ventricular ejection fraction, by visual estimation, is 55 to 60%. The left ventricle has normal function. There is no left ventricular hypertrophy.  2. The left ventricle has no regional wall motion abnormalities.  3. GLS was normal September 2020 at -22.5 Now measured at -12.5 but does not appear accurate tracking epicardium  and not endocardium Suggest bringing patient back for repeat GLS measurement.  4. Global right ventricle has normal systolic function.The right ventricular size is normal. No increase in right ventricular wall thickness.  5. Left atrial size was normal.  6. Right atrial size was normal.  7. The mitral valve is normal in structure. Trivial mitral valve regurgitation. No evidence of mitral stenosis.  8. The tricuspid valve is normal in structure.  9. The tricuspid valve is normal in structure. Tricuspid valve regurgitation is trivial. 10. The aortic valve is normal in structure. Aortic valve regurgitation is not visualized. No evidence of aortic valve sclerosis or stenosis. 11. The pulmonic valve was normal in structure. Pulmonic valve regurgitation is not visualized. 12. The inferior vena cava is normal in size with greater than 50% respiratory variability, suggesting right atrial pressure of 3 mmHg. FINDINGS  Left Ventricle: Left ventricular ejection fraction, by visual estimation, is 55 to 60%. The left ventricle has normal function. The left ventricle has no regional wall motion abnormalities. There is no left ventricular hypertrophy. Left ventricular diastolic parameters were normal. Normal left atrial pressure. GLS was normal September 2020 at -22.5 Now measured at -12.5 but does not appear accurate tracking epicardium and not endocardium  Suggest bringing patient back for repeat GLS measurement. Right Ventricle: The right ventricular size is normal. No increase in right ventricular wall thickness. Global RV systolic function is has normal systolic function. Left Atrium: Left atrial size was normal in size. Right Atrium: Right atrial size was normal in size Pericardium: There is no evidence of pericardial effusion. Mitral Valve: The mitral valve is normal in structure. Trivial mitral valve regurgitation. No evidence of mitral valve stenosis by observation. Tricuspid Valve: The tricuspid valve is normal in structure. Tricuspid valve regurgitation is trivial. Aortic Valve: The aortic valve is normal in structure. Aortic valve regurgitation is not visualized. The aortic valve is structurally normal, with no evidence of sclerosis or stenosis. Pulmonic Valve: The pulmonic valve was normal in structure. Pulmonic valve regurgitation is not visualized. Pulmonic regurgitation is not visualized. Aorta: The aortic root, ascending aorta and aortic arch are all structurally normal, with no evidence of dilitation or obstruction. Venous: The inferior vena cava is normal in size with greater than 50% respiratory variability, suggesting right atrial pressure of 3 mmHg. IAS/Shunts: No atrial level shunt detected by color flow Doppler. There is no evidence of a patent foramen ovale. No ventricular septal defect is seen or detected. There is no evidence of an atrial septal defect.  LEFT VENTRICLE PLAX 2D LVIDd:         4.10 cm  Diastology LVIDs:         2.90 cm  LV e' lateral:   11.10 cm/s LV PW:         0.80 cm  LV E/e' lateral: 7.3 LV IVS:        0.65 cm  LV e' medial:    8.49 cm/s LVOT diam:     1.90 cm  LV E/e' medial:  9.5 LV SV:         42 ml LV SV Index:   26.74 LVOT Area:     2.84 cm  RIGHT VENTRICLE RV S prime:     12.90 cm/s TAPSE (M-mode): 1.6 cm LEFT ATRIUM             Index       RIGHT ATRIUM          Index LA diam:  3.10 cm 1.99 cm/m  RA Area:      7.49 cm LA Vol (A2C):   35.6 ml 22.86 ml/m RA Volume:   13.00 ml 8.35 ml/m LA Vol (A4C):   26.2 ml 16.82 ml/m LA Biplane Vol: 33.0 ml 21.19 ml/m  AORTIC VALVE LVOT Vmax:   87.90 cm/s LVOT Vmean:  56.400 cm/s LVOT VTI:    0.162 m  AORTA Ao Root diam: 2.80 cm MITRAL VALVE MV Area (PHT): 5.02 cm             SHUNTS MV PHT:        43.79 msec           Systemic VTI:  0.16 m MV Decel Time: 151 msec             Systemic Diam: 1.90 cm MV E velocity: 81.00 cm/s 103 cm/s MV A velocity: 66.50 cm/s 70.3 cm/s MV E/A ratio:  1.22       1.5  Jenkins Rouge MD Electronically signed by Jenkins Rouge MD Signature Date/Time: 08/14/2019/11:59:01 AM    Final     Impression: Hobbs IA (T1c, N0, M0) Left Breast UIQ, 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  Patient would be a good candidate for breast conserving therapy with radiation therapy directed at the left breast.  She would appear to be a good candidate for hypofractionated accelerated radiation therapy and she wishes to proceed with this treatment approach  Plan: Patient is scheduled for CT simulation later today.  Treatments to begin in approximately 1 week.  Anticipate 3 weeks of radiation therapy directed to the left breast.  She will then proceed with a boost to the lumpectomy cavity given the close surgical margins. She will  be treated with cardiac sparing techniques if necessary.  -----------------------------------  Blair Promise, PhD, MD  This document serves as a record of services personally performed by Gery Pray, MD. It was created on his behalf by Clerance Lav, a trained medical scribe. The creation of this record is based on the scribe's personal observations and the provider's statements to them. This document has been checked and approved by the attending provider.

## 2019-08-21 NOTE — Progress Notes (Signed)
Radiation Oncology         [phone] ________________________________  Name: Chelsea Hobbs MRN: [id number]  Date: 08/21/2019  DOB: 10/29/1970  SIMULATION AND TREATMENT PLANNING NOTE    ICD-10-CM   1. 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  Identity was confirmed.  All relevant records and images related to the planned course of therapy were reviewed.  The patient freely provided informed written consent to proceed with treatment after reviewing the details related to the planned course of therapy. The consent form was witnessed and verified by the simulation staff.  Then, the patient was set-up in a stable reproducible supine position for radiation therapy.  CT images were obtained.  Surface markings were placed.  The CT images were loaded into the planning software.  Then the target and avoidance structures were contoured.  Treatment planning then occurred.  The radiation prescription was entered and confirmed.  Then, I designed and supervised the construction of a total of 5 medically necessary complex treatment devices.  I have requested : 3D Simulation  I have requested a DVH of the following structures: heart, lungs, lumpectomy cavity.  I have ordered:CBC  PLAN:  The patient will receive 40.05 Gy in 15 fractions followed by a boost to the lumpectomy cavity of 12 Gy in 6 fractions.   Optical Surface Tracking Plan:  Since intensity modulated radiotherapy (IMRT) and 3D conformal radiation treatment methods are predicated on accurate and precise positioning for treatment, intrafraction motion monitoring is medically necessary to ensure accurate and safe treatment delivery.  The ability to quantify  intrafraction motion without excessive ionizing radiation dose can only be performed with optical surface tracking. Accordingly, surface imaging offers the opportunity to obtain 3D measurements of patient position throughout IMRT and 3D treatments without excessive radiation exposure.  I am ordering optical surface tracking for this patient's upcoming course of radiotherapy.   Special treatment procedure was performed today due to the extra time and effort required by myself to plan and prepare this patient for deep inspiration breath hold technique.  I have determined cardiac sparing to be of benefit to this patient to prevent long term cardiac damage due to radiation of the heart.  Bellows were placed on the patient's abdomen. To facilitate cardiac sparing, the patient was coached by the radiation therapists on breath hold techniques and breathing practice was performed. Practice waveforms were obtained. The patient was then scanned while maintaining breath hold in the treatment position.  This image was then transferred over to the imaging specialist. The imaging specialist then created a fusion of the free breathing and breath hold scans using the chest wall as the stable structure. I personally reviewed the fusion in axial, coronal and sagittal image planes.  Excellent cardiac sparing was obtained.  I felt the patient is an appropriate candidate for breath hold and the patient will be treated as such.  The image fusion was then reviewed with the patient to reinforce the necessity of reproducible breath hold.    -----------------------------------  Blair Promise, PhD, MD  This document serves as a record of services personally performed by Gery Pray, MD. It was created on his behalf by Clerance Lav,  a trained medical scribe. The creation of this record is based on the scribe's personal observations and the provider's statements to them. This document has been checked and approved by the attending  provider.

## 2019-08-21 NOTE — Patient Instructions (Signed)
Coronavirus (COVID-19) Are you at risk?  Are you at risk for the Coronavirus (COVID-19)?  To be considered HIGH RISK for Coronavirus (COVID-19), you have to meet the following criteria:  . Traveled to [place], [place], [place], [place] or [place]; or in the [place] to [place], [place], [place], or [place]; and have fever, cough, and shortness of breath within the last 2 weeks of travel OR . Been in close contact with a person diagnosed with COVID-19 within the last 2 weeks and have fever, cough, and shortness of breath . IF YOU DO NOT MEET THESE CRITERIA, YOU ARE CONSIDERED LOW RISK FOR COVID-19.  What to do if you are HIGH RISK for COVID-19?  . If you are having a medical emergency, call 911. . Seek medical care right away. Before you go to a doctor's office, urgent care or emergency department, call ahead and tell them about your recent travel, contact with someone diagnosed with COVID-19, and your symptoms. You should receive instructions from your physician's office regarding next steps of care.  . When you arrive at healthcare provider, tell the healthcare staff immediately you have returned from visiting [place], [place], [place], [place] or [place]; or traveled in the [place] to [place], [place], [place], or [place]; in the last two weeks or you have been in close contact with a person diagnosed with COVID-19 in the last 2 weeks.   . Tell the health care staff about your symptoms: fever, cough and shortness of breath. . After you have been seen by a medical provider, you will be either: o Tested for (COVID-19) and discharged home on quarantine except to seek medical care if symptoms worsen, and asked to  - Stay home and avoid contact with others until you get your results (4-5 days)  - Avoid travel on public transportation if possible (such as bus, train, or airplane) or o Sent to the Emergency Department by EMS for evaluation, COVID-19 testing, and possible  admission depending on your condition and test results.  What to do if you are LOW RISK for COVID-19?  Reduce your risk of any infection by using the same precautions used for avoiding the common cold or flu:  . Wash your hands often with soap and warm water for at least 20 seconds.  If soap and water are not readily available, use an alcohol-based hand sanitizer with at least 60% alcohol.  . If coughing or sneezing, cover your mouth and nose by coughing or sneezing into the elbow areas of your shirt or coat, into a tissue or into your sleeve (not your hands). . Avoid shaking hands with others and consider head nods or verbal greetings only. . Avoid touching your eyes, nose, or mouth with unwashed hands.  . Avoid close contact with people who are sick. . Avoid places or events with large numbers of people in one location, like concerts or sporting events. . Carefully consider travel plans you have or are making. . If you are planning any travel outside or inside the [place], visit the CDC's Travelers' Health webpage for the latest health notices. . If you have some symptoms but not all symptoms, continue to monitor at home and seek medical attention if your symptoms worsen. . If you are having a medical emergency, call 911.   ADDITIONAL HEALTHCARE OPTIONS FOR PATIENTS  [facility] / e-Visit: [email or web address]         [facility]: [phone]  [facility]: [phone]                   [facility]: [phone]   

## 2019-08-25 ENCOUNTER — Other Ambulatory Visit: Payer: Self-pay | Admitting: Hematology

## 2019-08-26 DIAGNOSIS — Z510 Encounter for antineoplastic radiation therapy: Secondary | ICD-10-CM | POA: Diagnosis not present

## 2019-08-26 DIAGNOSIS — Z170 Estrogen receptor positive status [ER+]: Secondary | ICD-10-CM | POA: Diagnosis not present

## 2019-08-26 DIAGNOSIS — C50212 Malignant neoplasm of upper-inner quadrant of left female breast: Secondary | ICD-10-CM | POA: Diagnosis not present

## 2019-08-26 DIAGNOSIS — D0512 Intraductal carcinoma in situ of left breast: Secondary | ICD-10-CM | POA: Diagnosis not present

## 2019-08-28 ENCOUNTER — Ambulatory Visit: Payer: BC Managed Care – PPO | Admitting: Radiation Oncology

## 2019-08-28 ENCOUNTER — Inpatient Hospital Stay (HOSPITAL_BASED_OUTPATIENT_CLINIC_OR_DEPARTMENT_OTHER): Payer: BC Managed Care – PPO | Admitting: Nurse Practitioner

## 2019-08-28 ENCOUNTER — Inpatient Hospital Stay: Payer: BC Managed Care – PPO | Attending: Hematology

## 2019-08-28 ENCOUNTER — Encounter: Payer: Self-pay | Admitting: Nurse Practitioner

## 2019-08-28 ENCOUNTER — Other Ambulatory Visit: Payer: Self-pay

## 2019-08-28 ENCOUNTER — Ambulatory Visit: Payer: BC Managed Care – PPO

## 2019-08-28 ENCOUNTER — Inpatient Hospital Stay: Payer: BC Managed Care – PPO

## 2019-08-28 VITALS — BP 124/84 | HR 82 | Temp 98.7°F | Resp 18 | Ht 62.0 in | Wt 126.3 lb

## 2019-08-28 DIAGNOSIS — C50212 Malignant neoplasm of upper-inner quadrant of left female breast: Secondary | ICD-10-CM

## 2019-08-28 DIAGNOSIS — Z170 Estrogen receptor positive status [ER+]: Secondary | ICD-10-CM | POA: Diagnosis not present

## 2019-08-28 DIAGNOSIS — Z95828 Presence of other vascular implants and grafts: Secondary | ICD-10-CM

## 2019-08-28 DIAGNOSIS — Z9221 Personal history of antineoplastic chemotherapy: Secondary | ICD-10-CM | POA: Insufficient documentation

## 2019-08-28 DIAGNOSIS — G4700 Insomnia, unspecified: Secondary | ICD-10-CM | POA: Diagnosis not present

## 2019-08-28 DIAGNOSIS — Z5112 Encounter for antineoplastic immunotherapy: Secondary | ICD-10-CM | POA: Insufficient documentation

## 2019-08-28 DIAGNOSIS — R21 Rash and other nonspecific skin eruption: Secondary | ICD-10-CM | POA: Diagnosis not present

## 2019-08-28 DIAGNOSIS — K529 Noninfective gastroenteritis and colitis, unspecified: Secondary | ICD-10-CM | POA: Diagnosis not present

## 2019-08-28 DIAGNOSIS — F419 Anxiety disorder, unspecified: Secondary | ICD-10-CM | POA: Insufficient documentation

## 2019-08-28 DIAGNOSIS — Z79899 Other long term (current) drug therapy: Secondary | ICD-10-CM | POA: Diagnosis not present

## 2019-08-28 LAB — CMP (CANCER CENTER ONLY)
ALT: 93 U/L — ABNORMAL HIGH (ref 0–44)
AST: 56 U/L — ABNORMAL HIGH (ref 15–41)
Albumin: 4.1 g/dL (ref 3.5–5.0)
Alkaline Phosphatase: 82 U/L (ref 38–126)
Anion gap: 10 (ref 5–15)
BUN: 15 mg/dL (ref 6–20)
CO2: 27 mmol/L (ref 22–32)
Calcium: 9.4 mg/dL (ref 8.9–10.3)
Chloride: 106 mmol/L (ref 98–111)
Creatinine: 0.72 mg/dL (ref 0.44–1.00)
GFR, Est AFR Am: >60 mL/min (ref >60)
GFR, Estimated: >60 mL/min (ref >60)
Glucose, Bld: 105 mg/dL — ABNORMAL HIGH (ref 70–99)
Potassium: 4.1 mmol/L (ref 3.5–5.1)
Sodium: 143 mmol/L (ref 135–145)
Total Bilirubin: 0.3 mg/dL (ref 0.3–1.2)
Total Protein: 7.1 g/dL (ref 6.5–8.1)

## 2019-08-28 LAB — CBC WITH DIFFERENTIAL (CANCER CENTER ONLY)
Abs Immature Granulocytes: 0.02 10*3/uL (ref 0.00–0.07)
Basophils Absolute: 0.1 10*3/uL (ref 0.0–0.1)
Basophils Relative: 2 %
Eosinophils Absolute: 0.2 10*3/uL (ref 0.0–0.5)
Eosinophils Relative: 3 %
HCT: 35 % — ABNORMAL LOW (ref 36.0–46.0)
Hemoglobin: 11.8 g/dL — ABNORMAL LOW (ref 12.0–15.0)
Immature Granulocytes: 0 %
Lymphocytes Relative: 33 %
Lymphs Abs: 2.5 10*3/uL (ref 0.7–4.0)
MCH: 31.2 pg (ref 26.0–34.0)
MCHC: 33.7 g/dL (ref 30.0–36.0)
MCV: 92.6 fL (ref 80.0–100.0)
Monocytes Absolute: 0.6 10*3/uL (ref 0.1–1.0)
Monocytes Relative: 8 %
Neutro Abs: 4.1 10*3/uL (ref 1.7–7.7)
Neutrophils Relative %: 54 %
Platelet Count: 374 10*3/uL (ref 150–400)
RBC: 3.78 MIL/uL — ABNORMAL LOW (ref 3.87–5.11)
RDW: 12.7 % (ref 11.5–15.5)
WBC Count: 7.6 10*3/uL (ref 4.0–10.5)
nRBC: 0 % (ref 0.0–0.2)

## 2019-08-28 MED ORDER — HEPARIN SOD (PORK) LOCK FLUSH 100 UNIT/ML IV SOLN
500.0000 [IU] | Freq: Once | INTRAVENOUS | Status: AC | PRN
  Administered 2019-08-28: 13:00:00 500 [IU]
  Filled 2019-08-28: qty 5

## 2019-08-28 MED ORDER — SODIUM CHLORIDE 0.9% FLUSH
10.0000 mL | INTRAVENOUS | Status: DC | PRN
  Administered 2019-08-28: 10 mL
  Filled 2019-08-28: qty 10

## 2019-08-28 NOTE — Progress Notes (Addendum)
Chelsea Hobbs   Telephone:(336) [phone] Fax:(336) [phone]   Clinic Follow up Note   Patient Care Team: Wenda Low, MD as PCP - General (Internal Medicine) Rockwell Germany, RN as Oncology Nurse Navigator Mauro Kaufmann, RN as Oncology Nurse Navigator Erroll Luna, MD as Consulting Physician (General Surgery) Truitt Merle, MD as Consulting Physician (Hematology) Gery Pray, MD as Consulting Physician (Radiation Oncology) 08/28/2019  CHIEF COMPLAINT: F/u left breast cancer   SUMMARY OF ONCOLOGIC HISTORY: Oncology History Overview Note  Cancer Staging Malignant neoplasm of upper-inner quadrant of left breast in female, estrogen receptor positive ([place]) Staging form: Breast, AJCC 8th Edition - Clinical stage from 03/22/2019: Stage IA (cT1c, cN0, cM0, G3, ER+, PR+, HER2+) - Signed by Truitt Merle, MD on 03/22/2019    Malignant neoplasm of upper-inner quadrant of left breast in female, estrogen receptor positive ([place])  03/10/2019 Mammogram   Diagnostic Mammogram 03/10/19  IMPRESSION: 1. Suspicious mass in the 10 o'clock position of the left breast, 3cm from the nipple measuring 12x7x94m. Biopsy is indicated. 2. Indeterminate mass in the 5:30 o'clock position of the left breast, 5cm from the nipple, measuring 9x6x717m Although this may reflect a fibroadenoma, tissue sampling is recommended.   03/16/2019 Initial Biopsy   Diagnosis 03/16/19  1. 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 SEE NOTE 2. Breast, left, needle core biopsy, 5:30 o'clock - FIBROADENOMA   03/16/2019 Receptors her2   Results: IMMUNOHISTOCHEMICAL AND MORPHOMETRIC ANALYSIS PERFORMED MANUALLY The tumor cells are POSITIVE for Her2 (3+). Estrogen Receptor: 100%, POSITIVE, STRONG STAINING INTENSITY Progesterone Receptor: 100%, POSITIVE, STRONG STAINING INTENSITY Proliferation Marker Ki67: 60%   03/21/2019 Initial Diagnosis   Malignant neoplasm of upper-inner  quadrant of left breast in female, estrogen receptor positive ([facility]  03/22/2019 Cancer Staging   Staging form: Breast, AJCC 8th Edition - Clinical stage from 03/22/2019: Stage IA (cT1c, cN0, cM0, G3, ER+, PR+, HER2+) - Signed by FeTruitt MerleMD on 03/22/2019   03/31/2019 Genetic Testing   VUS in CTNNA1 called [id number]>T (Intronic) was identified on the [facility] Common Hereditary Cancers Panel. The Common Hereditary Cancers Panel offered by [facility] includes sequencing and/or deletion duplication testing of the following 47 genes: APC, ATM, AXIN2, BARD1, BMPR1A, BRCA1, BRCA2, BRIP1, CDH1, CDKN2A (p14ARF), CDKN2A (p16INK4a), CKD4, CHEK2, CTNNA1, DICER1, EPCAM (Deletion/duplication testing only), GREM1 (promoter region deletion/duplication testing only), KIT, MEN1, MLH1, MSH2, MSH3, MSH6, MUTYH, NBN, NF1, NHTL1, PALB2, PDGFRA, PMS2, POLD1, POLE, PTEN, RAD50, RAD51C, RAD51D, SDHB, SDHC, SDHD, SMAD4, SMARCA4. STK11, TP53, TSC1, TSC2, and VHL.  The following genes were evaluated for sequence changes only: SDHA and HOXB13 c.251G>A variant only. The report date is 03/30/2019.    03/31/2019 Breast MRI   MRI breast 03/31/19  IMPRESSION: 1. 5 millimeter enhancing oval mass in the anteromedial aspect of the RIGHT breast warranting further evaluation. Given its anterior depth and visibility on noncontrast images, ultrasound is recommended to determine if there is a sonographic correlate. If no sonographic correlate is identified to guide biopsy, MRI biopsy of the RIGHT breast is recommended. 2. Known malignancy in the UPPER INNER QUADRANT of the LEFT breast measuring 1.0 centimeters. 3. 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   04/04/2019 Surgery   RADIOCATIVE SEED GUIDED LEFT BREAST LUMPECTOMY, LEFT AXILLARY SENTINEL LYMPH NODE BIOPSY and PAC placement  By Dr. CoBrantley Stage9/22/20    04/04/2019 Pathology Results   DIAGNOSIS: 04/04/19  A. BREAST, LEFT, LUMPECTOMY:  -  Invasive  ductal carcinoma, Nottingham grade 3 of 3, 1.3 cm  -  Ductal carcinoma in-situ, intermediate grade  -  Margins uninvolved by carcinoma (0.1 cm; posterior margin; see  comment)  -  Previous biopsy site changes present  -  See oncology table and comment below   B. BREAST, LEFT, ADDITIONAL MEDIAL MARGIN, EXCISION:  -  Fibrocystic changes  -  No residual carcinoma identified   C. LYMPH NODE, LEFT AXILLARY, SENTINEL, BIOPSY:  -   No carcinoma identified in one lymph node (0/1)    04/04/2019 Cancer Staging   Staging form: Breast, AJCC 8th Edition - Pathologic stage from 04/04/2019: Stage IA (pT1c, pN0, cM0, G3, ER+, PR+, HER2+) - Signed by Truitt Merle, MD on 04/21/2019   04/11/2019 Pathology Results   Diagnosis Breast, right, needle core biopsy, 2 o'clock - SMALL INTRADUCTAL PAPILLOMA. SEE NOTE - SMALL FIBROADENOMA - NEGATIVE FOR CARCINOMA   05/12/2019 -  Chemotherapy   adjuvant Weekly Taxol and Herceptin for 12 weeks starting 05/12/19. Followed by maintenance Herceptin q3weeks to complete 1 year treatment.    08/29/2019 -  Radiation Therapy   Adjuvant radiation per Dr. Sondra Come      CURRENT THERAPY:  1. adjuvant Weekly Taxol and Herceptin for 12 weeksstarting 05/12/19. S/P 10 weekly doses. Starting from her 11th weekly dose, Taxol was changed to Abraxane secondary to a rash.  2. Followed by maintenance Herceptin q3weeks to complete 1 year treatment expected to start 09/01/2019. 3. Adjuvant radiation PENDING starting 08/29/19, followed by adjuvant anti-estrogen therapy   INTERVAL HISTORY: Chelsea Hobbs returns for f/u as scheduled. She completed abraxane and another cycle of Herceptin on 08/11/19. She feels well, except emotional today. Not sleeping well, since completing chemo she wakes up at 4-5 am and gets tired around noon. Her fatigue from chemo resolved. Her "irritable bowel" symptoms also resolved. Denies neuropathy, fever, chills, cough, chest pain, dyspnea, n/v/c/d, pain, rash. Her eyes  are dry and itchy.    MEDICAL HISTORY:  Past Medical History:  Diagnosis Date  . Asthma   . Family history of breast cancer   . Family history of lung cancer     SURGICAL HISTORY: Past Surgical History:  Procedure Laterality Date  . BREAST BIOPSY Right   . BREAST LUMPECTOMY WITH RADIOACTIVE SEED AND SENTINEL LYMPH NODE BIOPSY Left 04/04/2019   Procedure: RADIOCATIVE SEED GUIDED LEFT BREAST LUMPECTOMY, LEFT AXILLARY SENTINEL LYMPH NODE BIOPSY;  Surgeon: Erroll Luna, MD;  Location: [place] [facility];  Service: General;  Laterality: Left;  . NASAL SINUS SURGERY    . PORTACATH PLACEMENT N/A 04/04/2019   Procedure: INSERTION PORT-A-CATH WITH ULTRASOUND;  Surgeon: Erroll Luna, MD;  Location: [place];  Service: General;  Laterality: N/A;    I have reviewed the social history and family history with the patient and they are unchanged from previous note.  ALLERGIES:  is allergic to doxycycline and latex.  MEDICATIONS:  Current Outpatient Medications  Medication Sig Dispense Refill  . albuterol (PROVENTIL HFA;VENTOLIN HFA) 108 (90 BASE) MCG/ACT inhaler Inhale 2 puffs into the lungs every 6 (six) hours as needed. For shortness of breath    . ALPRAZolam (XANAX) 1 MG tablet Take 0.5 mg by mouth 2 (two) times daily as needed.    . Ascorbic Acid (VITAMIN C) 1000 MG tablet Take 1,000 mg by mouth daily.    . cholecalciferol (VITAMIN D3) 25 MCG (1000 UT) tablet Take 2,000 Units by mouth daily.     . clindamycin-benzoyl peroxide (  BENZACLIN) gel Apply topically 2 (two) times daily. (Patient not taking: Reported on 08/21/2019) 25 g 0  . fexofenadine (ALLEGRA) 60 MG tablet Take 60 mg by mouth 2 (two) times daily.    . fluticasone (FLONASE) 50 MCG/ACT nasal spray Place into both nostrils daily.    Marland Kitchen lidocaine-prilocaine (EMLA) cream Apply to affected area once 30 g 3  . omeprazole (PRILOSEC) 20 MG capsule TAKE 1 CAPSULE BY MOUTH EVERY DAY 90 capsule 0  . ondansetron  (ZOFRAN) 8 MG tablet Take 1 tablet (8 mg total) by mouth 2 (two) times daily as needed (Nausea or vomiting). 30 tablet 1  . prochlorperazine (COMPAZINE) 10 MG tablet Take 1 tablet (10 mg total) by mouth every 6 (six) hours as needed (Nausea or vomiting). 30 tablet 1  . SUMAtriptan (IMITREX) 100 MG tablet Take 100 mg by mouth.     No current facility-administered medications for this visit.    PHYSICAL EXAMINATION: ECOG PERFORMANCE STATUS: 0 - Asymptomatic  Vitals:   08/28/19 1333  BP: 124/84  Pulse: 82  Resp: 18  Temp: 98.7 F (37.1 C)  SpO2: 100%   Filed Weights   08/28/19 1333  Weight: 126 lb 4.8 oz (57.3 kg)    GENERAL:alert, no distress and comfortable SKIN: No rash  EYES: normal, conjunctiva are pink and non-injected, sclera clear LUNGS: clear with normal breathing effort HEART: regular rate & rhythm, no lower extremity edema ABDOMEN:abdomen soft, non-tender and normal bowel sounds NEURO: alert & oriented x 3 with fluent speech, no focal motor/sensory deficits PAC without erythema  Breast: visual inspection shows surgical incisions completely healed.   LABORATORY DATA:  I have reviewed the data as listed CBC Latest Ref Rng & Units 08/28/2019 08/11/2019 08/04/2019  WBC 4.0 - 10.5 K/uL 7.6 5.8 9.2  Hemoglobin 12.0 - 15.0 g/dL 11.8(L) 10.4(L) 11.5(L)  Hematocrit 36.0 - 46.0 % 35.0(L) 30.8(L) 34.1(L)  Platelets 150 - 400 K/uL 374 282 363     CMP Latest Ref Rng & Units 08/28/2019 08/11/2019 08/04/2019  Glucose 70 - 99 mg/dL 105(H) 100(H) 95  BUN 6 - 20 mg/dL _0 Creatinine 0.44 - 1.00 mg/dL 0.72 0.69 0.68  Sodium 135 - 145 mmol/L 143 140 142  Potassium 3.5 - 5.1 mmol/L 4.1 4.0 4.3  Chloride 98 - 111 mmol/L 106 106 107  CO2 22 - 32 mmol/L _1 Calcium 8.9 - 10.3 mg/dL 9.4 8.9 9.1  Total Protein 6.5 - 8.1 g/dL 7.1 6.7 7.0  Total Bilirubin 0.3 - 1.2 mg/dL 0.3 0.3 0.3  Alkaline Phos 38 - 126 U/L 82 63 68  AST 15 - 41 U/L 56(H) 23 23  ALT 0 - 44 U/L 93(H) 38  37      RADIOGRAPHIC STUDIES: I have personally reviewed the radiological images as listed and agreed with the findings in the report. No results found.   ASSESSMENT & PLAN: Chelsea Hobbs is a 49 y.o. 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 She underwent left breastlumpectomyand SLNB on 04/04/19.Her DCIS margins were close (75m). -Due to her 1.3 cm tumor, Dr. FBurr Medicorecommended adjuvant chemo to reduce recurrence risk, she started weekly taxol and Herceptin on 05/12/19. Due to skin rash, taxol was changed to abraxane for last 2 treatments. She continues maintenance herceptin to total 1 year of therapy  -Chelsea Hobbs appears stable. She completed adjuvant chemo with abraxane, overall she tolerated  well. She continues herceptin q3 weeks  -labs reviewed, anemia improved.  AST/ALT mildly elevated which may be related to recent chemo. Will monitor.  -She will proceed with next Herceptin infusion later this week.  -She will begin adjuvant radiation per Dr. Sondra Come on 08/29/19.  -Given her ER+ disease, she is a candidate for anti-estrogen therapy after she completes radiation. She is perimenopausal, she will likely start Tamoxifen. I briefly discussed side effects, she is interested  -F/u at the end of RT to finalize the plan for endocrine therapy   2. Bone Health -she is perimenopausal, will likely start on tamoxifen after radiation.  -will begin checking DEXA when she becomes post-menopausal or on AI  3. Genetic Testingwas negative for pathogenetic nutations  4. Anxiety  -Previously on Xanaxand Effexor. -Managed by her PCP -She will f/u with SW office as needed about her finances and stress. -Emotional today, I discussed it is normal to have changing emotions as she progress through during cancer treatment. She spoke with a patient in the lobby who validated her  feelings which was helpful   5. Colitis  -She has colitis and uses Mesalamine Suppositories.  -symptoms resolved after chemo   6. Skin rash on scalp, chest, and back  -Likely secondary to Taxol  -last 2 doses of adjuvant chemo changed to abraxane due to rash -resolved   7.Insomnia -lately she has early morning awakening between 4-5 AM -she can't tell if it's related to anxiety, depression -I recommended to try melatonin or benadryl 12.5 - 25 mg at night. If she is sleepy during work the next day, she can start with 1/2 dose benadryl   PLAN: -Return for herceptin 2/19, then back on Monday infusions (her day off work) -Proceed with adjuvant radiation starting 2/16 -Continue maintenance herceptin q3 weeks (to complete 1 year) -lab, flush, f/u with Dr. Burr Medico, and herceptin 3/15 at the end of RT to finalize plan for anti-estrogen therapy  -Try benadryl and/or melatonin for sleep     No problem-specific Assessment & Plan notes found for this encounter.   No orders of the defined types were placed in this encounter.  All questions were answered. The patient knows to call the clinic with any problems, questions or concerns. No barriers to learning was detected. I spent 20 counseling the patient face to face. The total time spent in the appointment was 30 minutes and more than 50% was on counseling and review of test results.     Alla Feeling, NP 08/28/19

## 2019-08-28 NOTE — Progress Notes (Signed)
  Radiation Oncology         [phone]) [phone] ________________________________  Name: Chelsea Hobbs MRN: [id number]  Date: 08/29/2019  DOB: [birth year]  Simulation Verification Note    ICD-10-CM   1. Malignant neoplasm of upper-inner quadrant of left breast in female, estrogen receptor positive ([place])  C50.212    Z17.0     NARRATIVE: The patient was brought to the treatment unit and placed in the planned treatment position. The clinical setup was verified. Then port films were obtained and uploaded to the radiation oncology medical record software.  The treatment beams were carefully compared against the planned radiation fields. The position location and shape of the radiation fields was reviewed. They targeted volume of tissue appears to be appropriately covered by the radiation beams. Organs at risk appear to be excluded as planned.  Based on my personal review, I approved the simulation verification. The patient's treatment will proceed as planned.  -----------------------------------  Blair Promise, PhD, MD  This document serves as a record of services personally performed by Gery Pray, MD. It was created on his behalf by Clerance Lav, a trained medical scribe. The creation of this record is based on the scribe's personal observations and the provider's statements to them. This document has been checked and approved by the attending provider.

## 2019-08-28 NOTE — Patient Instructions (Signed)

## 2019-08-29 ENCOUNTER — Telehealth: Payer: Self-pay | Admitting: Nurse Practitioner

## 2019-08-29 ENCOUNTER — Ambulatory Visit: Payer: BC Managed Care – PPO

## 2019-08-29 ENCOUNTER — Ambulatory Visit
Admission: RE | Admit: 2019-08-29 | Discharge: 2019-08-29 | Disposition: A | Discharge status: Home / Self Care | Payer: BC Managed Care – PPO | Source: Ambulatory Visit | Attending: Radiation Oncology | Admitting: Radiation Oncology

## 2019-08-29 ENCOUNTER — Other Ambulatory Visit: Payer: Self-pay

## 2019-08-29 DIAGNOSIS — D0512 Intraductal carcinoma in situ of left breast: Secondary | ICD-10-CM | POA: Diagnosis not present

## 2019-08-29 DIAGNOSIS — Z510 Encounter for antineoplastic radiation therapy: Secondary | ICD-10-CM | POA: Diagnosis not present

## 2019-08-29 DIAGNOSIS — C50212 Malignant neoplasm of upper-inner quadrant of left female breast: Secondary | ICD-10-CM | POA: Diagnosis not present

## 2019-08-29 DIAGNOSIS — Z170 Estrogen receptor positive status [ER+]: Secondary | ICD-10-CM | POA: Diagnosis not present

## 2019-08-29 NOTE — Telephone Encounter (Signed)
Scheduled appt per 2/15 los.  Patient will get an updated appt calendar at her next scheduled appt on 2/19.

## 2019-08-30 ENCOUNTER — Other Ambulatory Visit: Payer: Self-pay

## 2019-08-30 ENCOUNTER — Ambulatory Visit
Admission: RE | Admit: 2019-08-30 | Discharge: 2019-08-30 | Disposition: A | Discharge status: Home / Self Care | Payer: BC Managed Care – PPO | Source: Ambulatory Visit | Attending: Radiation Oncology | Admitting: Radiation Oncology

## 2019-08-30 DIAGNOSIS — C50212 Malignant neoplasm of upper-inner quadrant of left female breast: Secondary | ICD-10-CM | POA: Diagnosis not present

## 2019-08-30 DIAGNOSIS — Z510 Encounter for antineoplastic radiation therapy: Secondary | ICD-10-CM | POA: Diagnosis not present

## 2019-08-30 DIAGNOSIS — D0512 Intraductal carcinoma in situ of left breast: Secondary | ICD-10-CM | POA: Diagnosis not present

## 2019-08-30 DIAGNOSIS — Z170 Estrogen receptor positive status [ER+]: Secondary | ICD-10-CM | POA: Diagnosis not present

## 2019-08-31 ENCOUNTER — Ambulatory Visit: Payer: BC Managed Care – PPO

## 2019-08-31 ENCOUNTER — Ambulatory Visit
Admission: RE | Admit: 2019-08-31 | Discharge: 2019-08-31 | Disposition: A | Discharge status: Home / Self Care | Payer: BC Managed Care – PPO | Source: Ambulatory Visit | Attending: Radiation Oncology | Admitting: Radiation Oncology

## 2019-08-31 ENCOUNTER — Other Ambulatory Visit: Payer: Self-pay

## 2019-08-31 DIAGNOSIS — Z510 Encounter for antineoplastic radiation therapy: Secondary | ICD-10-CM | POA: Diagnosis not present

## 2019-08-31 DIAGNOSIS — C50212 Malignant neoplasm of upper-inner quadrant of left female breast: Secondary | ICD-10-CM | POA: Diagnosis not present

## 2019-08-31 DIAGNOSIS — D0512 Intraductal carcinoma in situ of left breast: Secondary | ICD-10-CM | POA: Diagnosis not present

## 2019-08-31 DIAGNOSIS — Z170 Estrogen receptor positive status [ER+]: Secondary | ICD-10-CM | POA: Diagnosis not present

## 2019-09-01 ENCOUNTER — Ambulatory Visit
Admission: RE | Admit: 2019-09-01 | Discharge: 2019-09-01 | Disposition: A | Discharge status: Home / Self Care | Payer: BC Managed Care – PPO | Source: Ambulatory Visit | Attending: Radiation Oncology | Admitting: Radiation Oncology

## 2019-09-01 ENCOUNTER — Ambulatory Visit: Payer: BC Managed Care – PPO

## 2019-09-01 ENCOUNTER — Inpatient Hospital Stay: Payer: BC Managed Care – PPO

## 2019-09-01 ENCOUNTER — Other Ambulatory Visit: Payer: Self-pay

## 2019-09-01 ENCOUNTER — Other Ambulatory Visit: Payer: BC Managed Care – PPO

## 2019-09-01 ENCOUNTER — Ambulatory Visit: Payer: BC Managed Care – PPO | Admitting: Hematology

## 2019-09-01 ENCOUNTER — Other Ambulatory Visit: Payer: Self-pay | Admitting: Hematology

## 2019-09-01 VITALS — BP 116/86 | HR 69 | Temp 98.7°F | Resp 18

## 2019-09-01 DIAGNOSIS — C50212 Malignant neoplasm of upper-inner quadrant of left female breast: Secondary | ICD-10-CM | POA: Diagnosis not present

## 2019-09-01 DIAGNOSIS — G4700 Insomnia, unspecified: Secondary | ICD-10-CM | POA: Diagnosis not present

## 2019-09-01 DIAGNOSIS — K529 Noninfective gastroenteritis and colitis, unspecified: Secondary | ICD-10-CM | POA: Diagnosis not present

## 2019-09-01 DIAGNOSIS — Z79899 Other long term (current) drug therapy: Secondary | ICD-10-CM | POA: Diagnosis not present

## 2019-09-01 DIAGNOSIS — Z170 Estrogen receptor positive status [ER+]: Secondary | ICD-10-CM | POA: Diagnosis not present

## 2019-09-01 DIAGNOSIS — Z510 Encounter for antineoplastic radiation therapy: Secondary | ICD-10-CM | POA: Diagnosis not present

## 2019-09-01 DIAGNOSIS — D0512 Intraductal carcinoma in situ of left breast: Secondary | ICD-10-CM | POA: Diagnosis not present

## 2019-09-01 DIAGNOSIS — R21 Rash and other nonspecific skin eruption: Secondary | ICD-10-CM | POA: Diagnosis not present

## 2019-09-01 DIAGNOSIS — F419 Anxiety disorder, unspecified: Secondary | ICD-10-CM | POA: Diagnosis not present

## 2019-09-01 DIAGNOSIS — Z5112 Encounter for antineoplastic immunotherapy: Secondary | ICD-10-CM | POA: Diagnosis not present

## 2019-09-01 DIAGNOSIS — Z9221 Personal history of antineoplastic chemotherapy: Secondary | ICD-10-CM | POA: Diagnosis not present

## 2019-09-01 MED ORDER — DIPHENHYDRAMINE HCL 25 MG PO CAPS
ORAL_CAPSULE | ORAL | Status: AC
  Filled 2019-09-01: qty 2

## 2019-09-01 MED ORDER — SODIUM CHLORIDE 0.9 % IV SOLN
Freq: Once | INTRAVENOUS | Status: AC
  Filled 2019-09-01: qty 250

## 2019-09-01 MED ORDER — ACETAMINOPHEN 325 MG PO TABS
650.0000 mg | ORAL_TABLET | Freq: Once | ORAL | Status: AC
  Administered 2019-09-01: 650 mg via ORAL

## 2019-09-01 MED ORDER — TRASTUZUMAB-DKST CHEMO 150 MG IV SOLR
6.0000 mg/kg | Freq: Once | INTRAVENOUS | Status: AC
  Administered 2019-09-01: 336 mg via INTRAVENOUS
  Filled 2019-09-01: qty 16

## 2019-09-01 MED ORDER — ACETAMINOPHEN 325 MG PO TABS
ORAL_TABLET | ORAL | Status: AC
  Filled 2019-09-01: qty 2

## 2019-09-01 MED ORDER — DIPHENHYDRAMINE HCL 25 MG PO CAPS
50.0000 mg | ORAL_CAPSULE | Freq: Once | ORAL | Status: AC
  Administered 2019-09-01: 50 mg via ORAL

## 2019-09-01 MED ORDER — SODIUM CHLORIDE 0.9% FLUSH
10.0000 mL | INTRAVENOUS | Status: DC | PRN
  Filled 2019-09-01: qty 10

## 2019-09-01 MED ORDER — HEPARIN SOD (PORK) LOCK FLUSH 100 UNIT/ML IV SOLN
500.0000 [IU] | Freq: Once | INTRAVENOUS | Status: DC | PRN
  Filled 2019-09-01: qty 5

## 2019-09-04 ENCOUNTER — Other Ambulatory Visit: Payer: Self-pay

## 2019-09-04 ENCOUNTER — Ambulatory Visit: Admission: RE | Admit: 2019-09-04 | Payer: BC Managed Care – PPO | Source: Ambulatory Visit

## 2019-09-04 DIAGNOSIS — Z510 Encounter for antineoplastic radiation therapy: Secondary | ICD-10-CM | POA: Diagnosis not present

## 2019-09-04 DIAGNOSIS — Z170 Estrogen receptor positive status [ER+]: Secondary | ICD-10-CM | POA: Diagnosis not present

## 2019-09-04 DIAGNOSIS — D0512 Intraductal carcinoma in situ of left breast: Secondary | ICD-10-CM | POA: Diagnosis not present

## 2019-09-04 DIAGNOSIS — C50212 Malignant neoplasm of upper-inner quadrant of left female breast: Secondary | ICD-10-CM | POA: Diagnosis not present

## 2019-09-05 ENCOUNTER — Other Ambulatory Visit: Payer: Self-pay

## 2019-09-05 ENCOUNTER — Ambulatory Visit
Admission: RE | Admit: 2019-09-05 | Discharge: 2019-09-05 | Disposition: A | Discharge status: Home / Self Care | Payer: BC Managed Care – PPO | Source: Ambulatory Visit | Attending: Radiation Oncology | Admitting: Radiation Oncology

## 2019-09-05 DIAGNOSIS — Z170 Estrogen receptor positive status [ER+]: Secondary | ICD-10-CM | POA: Diagnosis not present

## 2019-09-05 DIAGNOSIS — C50212 Malignant neoplasm of upper-inner quadrant of left female breast: Secondary | ICD-10-CM | POA: Diagnosis not present

## 2019-09-05 DIAGNOSIS — Z510 Encounter for antineoplastic radiation therapy: Secondary | ICD-10-CM | POA: Diagnosis not present

## 2019-09-05 DIAGNOSIS — D0512 Intraductal carcinoma in situ of left breast: Secondary | ICD-10-CM | POA: Diagnosis not present

## 2019-09-06 ENCOUNTER — Other Ambulatory Visit: Payer: Self-pay

## 2019-09-06 ENCOUNTER — Ambulatory Visit: Admission: RE | Admit: 2019-09-06 | Payer: BC Managed Care – PPO | Source: Ambulatory Visit

## 2019-09-06 DIAGNOSIS — D0512 Intraductal carcinoma in situ of left breast: Secondary | ICD-10-CM | POA: Diagnosis not present

## 2019-09-06 DIAGNOSIS — C50212 Malignant neoplasm of upper-inner quadrant of left female breast: Secondary | ICD-10-CM | POA: Diagnosis not present

## 2019-09-06 DIAGNOSIS — Z510 Encounter for antineoplastic radiation therapy: Secondary | ICD-10-CM | POA: Diagnosis not present

## 2019-09-06 DIAGNOSIS — Z170 Estrogen receptor positive status [ER+]: Secondary | ICD-10-CM | POA: Diagnosis not present

## 2019-09-07 ENCOUNTER — Ambulatory Visit
Admission: RE | Admit: 2019-09-07 | Discharge: 2019-09-07 | Disposition: A | Discharge status: Home / Self Care | Payer: BC Managed Care – PPO | Source: Ambulatory Visit | Attending: Radiation Oncology | Admitting: Radiation Oncology

## 2019-09-07 ENCOUNTER — Other Ambulatory Visit: Payer: Self-pay

## 2019-09-07 DIAGNOSIS — Z510 Encounter for antineoplastic radiation therapy: Secondary | ICD-10-CM | POA: Diagnosis not present

## 2019-09-07 DIAGNOSIS — D0512 Intraductal carcinoma in situ of left breast: Secondary | ICD-10-CM | POA: Diagnosis not present

## 2019-09-07 DIAGNOSIS — Z170 Estrogen receptor positive status [ER+]: Secondary | ICD-10-CM | POA: Diagnosis not present

## 2019-09-07 DIAGNOSIS — C50212 Malignant neoplasm of upper-inner quadrant of left female breast: Secondary | ICD-10-CM | POA: Diagnosis not present

## 2019-09-08 ENCOUNTER — Ambulatory Visit
Admission: RE | Admit: 2019-09-08 | Discharge: 2019-09-08 | Disposition: A | Discharge status: Home / Self Care | Payer: BC Managed Care – PPO | Source: Ambulatory Visit | Attending: Radiation Oncology | Admitting: Radiation Oncology

## 2019-09-08 ENCOUNTER — Other Ambulatory Visit: Payer: Self-pay

## 2019-09-08 DIAGNOSIS — Z170 Estrogen receptor positive status [ER+]: Secondary | ICD-10-CM | POA: Diagnosis not present

## 2019-09-08 DIAGNOSIS — D0512 Intraductal carcinoma in situ of left breast: Secondary | ICD-10-CM | POA: Diagnosis not present

## 2019-09-08 DIAGNOSIS — Z510 Encounter for antineoplastic radiation therapy: Secondary | ICD-10-CM | POA: Diagnosis not present

## 2019-09-08 DIAGNOSIS — C50212 Malignant neoplasm of upper-inner quadrant of left female breast: Secondary | ICD-10-CM | POA: Diagnosis not present

## 2019-09-11 ENCOUNTER — Other Ambulatory Visit: Payer: Self-pay

## 2019-09-11 ENCOUNTER — Ambulatory Visit
Admission: RE | Admit: 2019-09-11 | Discharge: 2019-09-11 | Disposition: A | Discharge status: Home / Self Care | Payer: BC Managed Care – PPO | Source: Ambulatory Visit | Attending: Radiation Oncology | Admitting: Radiation Oncology

## 2019-09-11 DIAGNOSIS — Z510 Encounter for antineoplastic radiation therapy: Secondary | ICD-10-CM | POA: Insufficient documentation

## 2019-09-11 DIAGNOSIS — C50212 Malignant neoplasm of upper-inner quadrant of left female breast: Secondary | ICD-10-CM | POA: Diagnosis not present

## 2019-09-11 DIAGNOSIS — Z139 Encounter for screening, unspecified: Secondary | ICD-10-CM | POA: Diagnosis not present

## 2019-09-11 DIAGNOSIS — D0512 Intraductal carcinoma in situ of left breast: Secondary | ICD-10-CM | POA: Diagnosis not present

## 2019-09-11 DIAGNOSIS — Z170 Estrogen receptor positive status [ER+]: Secondary | ICD-10-CM | POA: Diagnosis not present

## 2019-09-12 ENCOUNTER — Other Ambulatory Visit: Payer: Self-pay

## 2019-09-12 ENCOUNTER — Ambulatory Visit
Admission: RE | Admit: 2019-09-12 | Discharge: 2019-09-12 | Disposition: A | Discharge status: Home / Self Care | Payer: BC Managed Care – PPO | Source: Ambulatory Visit | Attending: Radiation Oncology | Admitting: Radiation Oncology

## 2019-09-12 DIAGNOSIS — Z510 Encounter for antineoplastic radiation therapy: Secondary | ICD-10-CM | POA: Diagnosis not present

## 2019-09-12 DIAGNOSIS — D0512 Intraductal carcinoma in situ of left breast: Secondary | ICD-10-CM | POA: Diagnosis not present

## 2019-09-12 DIAGNOSIS — Z170 Estrogen receptor positive status [ER+]: Secondary | ICD-10-CM | POA: Diagnosis not present

## 2019-09-13 ENCOUNTER — Other Ambulatory Visit: Payer: Self-pay

## 2019-09-13 ENCOUNTER — Ambulatory Visit
Admission: RE | Admit: 2019-09-13 | Discharge: 2019-09-13 | Disposition: A | Discharge status: Home / Self Care | Payer: BC Managed Care – PPO | Source: Ambulatory Visit | Attending: Radiation Oncology | Admitting: Radiation Oncology

## 2019-09-13 DIAGNOSIS — D0512 Intraductal carcinoma in situ of left breast: Secondary | ICD-10-CM | POA: Diagnosis not present

## 2019-09-13 DIAGNOSIS — Z510 Encounter for antineoplastic radiation therapy: Secondary | ICD-10-CM | POA: Diagnosis not present

## 2019-09-13 DIAGNOSIS — Z170 Estrogen receptor positive status [ER+]: Secondary | ICD-10-CM | POA: Diagnosis not present

## 2019-09-14 ENCOUNTER — Other Ambulatory Visit: Payer: Self-pay

## 2019-09-14 ENCOUNTER — Ambulatory Visit
Admission: RE | Admit: 2019-09-14 | Discharge: 2019-09-14 | Disposition: A | Discharge status: Home / Self Care | Payer: BC Managed Care – PPO | Source: Ambulatory Visit | Attending: Radiation Oncology | Admitting: Radiation Oncology

## 2019-09-14 DIAGNOSIS — D0512 Intraductal carcinoma in situ of left breast: Secondary | ICD-10-CM | POA: Diagnosis not present

## 2019-09-14 DIAGNOSIS — Z510 Encounter for antineoplastic radiation therapy: Secondary | ICD-10-CM | POA: Diagnosis not present

## 2019-09-14 DIAGNOSIS — Z170 Estrogen receptor positive status [ER+]: Secondary | ICD-10-CM | POA: Diagnosis not present

## 2019-09-15 ENCOUNTER — Other Ambulatory Visit: Payer: Self-pay

## 2019-09-15 ENCOUNTER — Ambulatory Visit
Admission: RE | Admit: 2019-09-15 | Discharge: 2019-09-15 | Disposition: A | Discharge status: Home / Self Care | Payer: BC Managed Care – PPO | Source: Ambulatory Visit | Attending: Radiation Oncology | Admitting: Radiation Oncology

## 2019-09-15 DIAGNOSIS — C50212 Malignant neoplasm of upper-inner quadrant of left female breast: Secondary | ICD-10-CM | POA: Diagnosis not present

## 2019-09-15 DIAGNOSIS — D0512 Intraductal carcinoma in situ of left breast: Secondary | ICD-10-CM | POA: Diagnosis not present

## 2019-09-15 DIAGNOSIS — Z510 Encounter for antineoplastic radiation therapy: Secondary | ICD-10-CM | POA: Diagnosis not present

## 2019-09-15 DIAGNOSIS — Z170 Estrogen receptor positive status [ER+]: Secondary | ICD-10-CM | POA: Diagnosis not present

## 2019-09-18 ENCOUNTER — Ambulatory Visit: Payer: BC Managed Care – PPO

## 2019-09-18 ENCOUNTER — Other Ambulatory Visit: Payer: BC Managed Care – PPO

## 2019-09-18 ENCOUNTER — Ambulatory Visit: Payer: BC Managed Care – PPO | Admitting: Hematology

## 2019-09-18 ENCOUNTER — Ambulatory Visit
Admission: RE | Admit: 2019-09-18 | Discharge: 2019-09-18 | Disposition: A | Discharge status: Home / Self Care | Payer: BC Managed Care – PPO | Source: Ambulatory Visit | Attending: Radiation Oncology | Admitting: Radiation Oncology

## 2019-09-18 ENCOUNTER — Other Ambulatory Visit: Payer: Self-pay

## 2019-09-18 DIAGNOSIS — Z510 Encounter for antineoplastic radiation therapy: Secondary | ICD-10-CM | POA: Diagnosis not present

## 2019-09-18 DIAGNOSIS — D241 Benign neoplasm of right breast: Secondary | ICD-10-CM | POA: Diagnosis not present

## 2019-09-18 DIAGNOSIS — C50212 Malignant neoplasm of upper-inner quadrant of left female breast: Secondary | ICD-10-CM | POA: Diagnosis not present

## 2019-09-18 DIAGNOSIS — Z170 Estrogen receptor positive status [ER+]: Secondary | ICD-10-CM | POA: Diagnosis not present

## 2019-09-18 DIAGNOSIS — C50912 Malignant neoplasm of unspecified site of left female breast: Secondary | ICD-10-CM | POA: Diagnosis not present

## 2019-09-18 DIAGNOSIS — D0512 Intraductal carcinoma in situ of left breast: Secondary | ICD-10-CM | POA: Diagnosis not present

## 2019-09-18 NOTE — Progress Notes (Signed)
[place]   [phone] [phone]   Clinic Follow up Note   Patient Care Team: Wenda Low, MD as PCP - General (Internal Medicine) Rockwell Germany, RN as Oncology Nurse Navigator Mauro Kaufmann, RN as Oncology Nurse Navigator Erroll Luna, MD as Consulting Physician (General Surgery) Truitt Merle, MD as Consulting Physician (Hematology) Gery Pray, MD as Consulting Physician (Radiation Oncology)  Date of Service:  09/25/2019  CHIEF COMPLAINT: F/u of left breast cancer  SUMMARY OF ONCOLOGIC HISTORY: Oncology History Overview Note  Cancer Staging Malignant neoplasm of upper-inner quadrant of left breast in female, estrogen receptor positive ([place]) Staging form: Breast, AJCC 8th Edition - Clinical stage from 03/22/2019: Stage IA (cT1c, cN0, cM0, G3, ER+, PR+, HER2+) - Signed by Truitt Merle, MD on 03/22/2019    Malignant neoplasm of upper-inner quadrant of left breast in female, estrogen receptor positive ([place])  03/10/2019 Mammogram   Diagnostic Mammogram 03/10/19  IMPRESSION: 1. Suspicious mass in the 10 o'clock position of the left breast, 3cm from the nipple measuring 12x7x9m. Biopsy is indicated. 2. Indeterminate mass in the 5:30 o'clock position of the left breast, 5cm from the nipple, measuring 9x6x733m Although this may reflect a fibroadenoma, tissue sampling is recommended.   03/16/2019 Initial Biopsy   Diagnosis 03/16/19  1. 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 SEE NOTE 2. Breast, left, needle core biopsy, 5:30 o'clock - FIBROADENOMA   03/16/2019 Receptors her2   Results: IMMUNOHISTOCHEMICAL AND MORPHOMETRIC ANALYSIS PERFORMED MANUALLY The tumor cells are POSITIVE for Her2 (3+). Estrogen Receptor: 100%, POSITIVE, STRONG STAINING INTENSITY Progesterone Receptor: 100%, POSITIVE, STRONG STAINING INTENSITY Proliferation Marker Ki67: 60%   03/21/2019 Initial Diagnosis   Malignant neoplasm  of upper-inner quadrant of left breast in female, estrogen receptor positive ([facility]  03/22/2019 Cancer Staging   Staging form: Breast, AJCC 8th Edition - Clinical stage from 03/22/2019: Stage IA (cT1c, cN0, cM0, G3, ER+, PR+, HER2+) - Signed by FeTruitt MerleMD on 03/22/2019   03/31/2019 Genetic Testing   VUS in CTNNA1 called [id number]>T (Intronic) was identified on the [facility] Common Hereditary Cancers Panel. The Common Hereditary Cancers Panel offered by [facility] includes sequencing and/or deletion duplication testing of the following 47 genes: APC, ATM, AXIN2, BARD1, BMPR1A, BRCA1, BRCA2, BRIP1, CDH1, CDKN2A (p14ARF), CDKN2A (p16INK4a), CKD4, CHEK2, CTNNA1, DICER1, EPCAM (Deletion/duplication testing only), GREM1 (promoter region deletion/duplication testing only), KIT, MEN1, MLH1, MSH2, MSH3, MSH6, MUTYH, NBN, NF1, NHTL1, PALB2, PDGFRA, PMS2, POLD1, POLE, PTEN, RAD50, RAD51C, RAD51D, SDHB, SDHC, SDHD, SMAD4, SMARCA4. STK11, TP53, TSC1, TSC2, and VHL.  The following genes were evaluated for sequence changes only: SDHA and HOXB13 c.251G>A variant only. The report date is 03/30/2019.    03/31/2019 Breast MRI   MRI breast 03/31/19  IMPRESSION: 1. 5 millimeter enhancing oval mass in the anteromedial aspect of the RIGHT breast warranting further evaluation. Given its anterior depth and visibility on noncontrast images, ultrasound is recommended to determine if there is a sonographic correlate. If no sonographic correlate is identified to guide biopsy, MRI biopsy of the RIGHT breast is recommended. 2. Known malignancy in the UPPER INNER QUADRANT of the LEFT breast measuring 1.0 centimeters. 3. 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   04/04/2019 Surgery   RADIOCATIVE SEED GUIDED LEFT BREAST LUMPECTOMY, LEFT AXILLARY SENTINEL LYMPH NODE BIOPSY and PAC placement  By Dr. CoBrantley Stage9/22/20    04/04/2019 Pathology Results   DIAGNOSIS: 04/04/19  A. BREAST,  LEFT, LUMPECTOMY:    -  Invasive ductal carcinoma, Nottingham grade 3 of 3, 1.3 cm  -  Ductal carcinoma in-situ, intermediate grade  -  Margins uninvolved by carcinoma (0.1 cm; posterior margin; see  comment)  -  Previous biopsy site changes present  -  See oncology table and comment below   B. BREAST, LEFT, ADDITIONAL MEDIAL MARGIN, EXCISION:  -  Fibrocystic changes  -  No residual carcinoma identified   C. LYMPH NODE, LEFT AXILLARY, SENTINEL, BIOPSY:  -   No carcinoma identified in one lymph node (0/1)    04/04/2019 Cancer Staging   Staging form: Breast, AJCC 8th Edition - Pathologic stage from 04/04/2019: Stage IA (pT1c, pN0, cM0, G3, ER+, PR+, HER2+) - Signed by Truitt Merle, MD on 04/21/2019   04/11/2019 Pathology Results   Diagnosis Breast, right, needle core biopsy, 2 o'clock - SMALL INTRADUCTAL PAPILLOMA. SEE NOTE - SMALL FIBROADENOMA - NEGATIVE FOR CARCINOMA   05/12/2019 -  Chemotherapy   Adjuvant Weekly Taxol and Herceptin for 12 weeks starting 05/12/19, switched to Abraxane on 08/04/19 due to skin rash. Completed on 08/11/19.  Followed by maintenance Herceptin q3weeks to complete 1 year treatment (from 05/12/19) starting 09/01/19.     08/29/2019 - 09/26/2019 Radiation Therapy   Adjuvant radiation per Dr. Sondra Come    10/12/2019 -  Anti-estrogen oral therapy   Tamoxifen 23m daily starting 10/12/19      CURRENT THERAPY:  Maintenance Herceptin q3weeks to complete 1 year treatment (from 05/12/19) starting 09/01/19.  Adjuvant RT with Dr. mLisbeth Renshaw2/16/21-3/16/21 Zometa every 6 months starting next Office Visit  Tamoxifen 255mdaily starting 10/12/19   INTERVAL HISTORY:  Chelsea Hobbs is here for a follow up and treatment. She presents to the clinic alone. She notes she is doing well. She has ben tolerating well with fatigue and skin redness and darkness. She notes she has joint pain after sitting for so long. She notes she continues to work through her treatment. She notes s/p chemo she notes her  finger strength is not back but still able to work. She notes she has seen her Gyn and watcing her hormone levels. Before her cancer diagnosis it appeared she was menopausal, but repeat tests from last month show she is not menopausal.     REVIEW OF SYSTEMS:   Constitutional: Denies fevers, chills or abnormal weight loss Eyes: Denies blurriness of vision Ears, nose, mouth, throat, and face: Denies mucositis or sore throat Respiratory: Denies cough, dyspnea or wheezes Cardiovascular: Denies palpitation, chest discomfort or lower extremity swelling Gastrointestinal:  Denies nausea, heartburn or change in bowel habits Skin: Denies abnormal skin rashes MSK: (+) joint pain  Lymphatics: Denies new lymphadenopathy or easy bruising Neurological:Denies numbness, tingling or new weaknesses (+) Muscle weakness of hand/finger Behavioral/Psych: Mood is stable, no new changes  All other systems were reviewed with the patient and are negative.  MEDICAL HISTORY:  Past Medical History:  Diagnosis Date   Asthma    Family history of breast cancer    Family history of lung cancer     SURGICAL HISTORY: Past Surgical History:  Procedure Laterality Date   BREAST BIOPSY Right    BREAST LUMPECTOMY WITH RADIOACTIVE SEED AND SENTINEL LYMPH NODE BIOPSY Left 04/04/2019   Procedure: RADIOCATIVE SEED GUIDED LEFT BREAST LUMPECTOMY, LEFT AXILLARY SENTINEL LYMPH NODE BIOPSY;  Surgeon: CoErroll LunaMD;  [facility] Service: General;  Laterality: Left;   NASAL SINUS SURGERY     PORTACATH PLACEMENT  N/A 04/04/2019   Procedure: INSERTION PORT-A-CATH WITH ULTRASOUND;  Surgeon: Erroll Luna, MD;  Location: [facility];  Service: General;  Laterality: N/A;    I have reviewed the social history and family history with the patient and they are unchanged from previous note.  ALLERGIES:  is allergic to doxycycline and latex.  MEDICATIONS:  Current Outpatient  Medications  Medication Sig Dispense Refill   albuterol (PROVENTIL HFA;VENTOLIN HFA) 108 (90 BASE) MCG/ACT inhaler Inhale 2 puffs into the lungs every 6 (six) hours as needed. For shortness of breath     alendronate (FOSAMAX) 35 MG tablet alendronate 35 mg tablet     ALPRAZolam (XANAX) 1 MG tablet Take 0.5 mg by mouth 2 (two) times daily as needed.     Ascorbic Acid (VITAMIN C) 1000 MG tablet Take 1,000 mg by mouth daily.     cholecalciferol (VITAMIN D3) 25 MCG (1000 UT) tablet Take 2,000 Units by mouth daily.      clindamycin-benzoyl peroxide (BENZACLIN) gel Apply topically 2 (two) times daily. (Patient not taking: Reported on 08/21/2019) 25 g 0   fexofenadine (ALLEGRA) 60 MG tablet Take 60 mg by mouth 2 (two) times daily.     fluticasone (FLONASE) 50 MCG/ACT nasal spray Place into both nostrils daily.     lidocaine-prilocaine (EMLA) cream Apply to affected area once 30 g 3   omeprazole (PRILOSEC) 20 MG capsule TAKE 1 CAPSULE BY MOUTH EVERY DAY 90 capsule 0   ondansetron (ZOFRAN) 8 MG tablet Take 1 tablet (8 mg total) by mouth 2 (two) times daily as needed (Nausea or vomiting). 30 tablet 1   prochlorperazine (COMPAZINE) 10 MG tablet Take 1 tablet (10 mg total) by mouth every 6 (six) hours as needed (Nausea or vomiting). 30 tablet 1   SUMAtriptan (IMITREX) 100 MG tablet Take 100 mg by mouth.     tamoxifen (NOLVADEX) 20 MG tablet Take 1 tablet (20 mg total) by mouth daily. 30 tablet 2   No current facility-administered medications for this visit.    PHYSICAL EXAMINATION: ECOG PERFORMANCE STATUS: 1 - Symptomatic but completely ambulatory  Vitals:   09/25/19 1332  BP: 122/79  Pulse: 74  Resp: 20  Temp: 98.1 F (36.7 C)  SpO2: 100%   Filed Weights   09/25/19 1332  Weight: 128 lb (58.1 kg)    GENERAL:alert, no distress and comfortable SKIN: skin color, texture, turgor are normal, no rashes or significant lesions EYES: normal, Conjunctiva are pink and non-injected, sclera  clear  NECK: supple, thyroid normal size, non-tender, without nodularity LYMPH:  no palpable lymphadenopathy in the cervical, axillary  LUNGS: clear to auscultation and percussion with normal breathing effort HEART: regular rate & rhythm and no murmurs and no lower extremity edema ABDOMEN:abdomen soft, non-tender and normal bowel sounds Musculoskeletal:no cyanosis of digits and no clubbing  NEURO: alert & oriented x 3 with fluent speech, Very mild sensory deficits BREAST: S/p left lumpectomy: Surgical incision healed well (+) Skin erythema and hyperpigmentation from RT. No palpable mass, nodules or adenopathy bilaterally. Breast exam benign.   LABORATORY DATA:  I have reviewed the data as listed CBC Latest Ref Rng & Units 09/25/2019 08/28/2019 08/11/2019  WBC 4.0 - 10.5 K/uL 7.5 7.6 5.8  Hemoglobin 12.0 - 15.0 g/dL 12.3 11.8(L) 10.4(L)  Hematocrit 36.0 - 46.0 % 35.9(L) 35.0(L) 30.8(L)  Platelets 150 - 400 K/uL 291 374 282     CMP Latest Ref Rng & Units 09/25/2019 08/28/2019 08/11/2019  Glucose 70 - 99 mg/dL  88 105(H) 100(H)  BUN 6 - 20 mg/dL '16 15 15  ' Creatinine 0.44 - 1.00 mg/dL 0.72 0.72 0.69  Sodium 135 - 145 mmol/L 140 143 140  Potassium 3.5 - 5.1 mmol/L 4.1 4.1 4.0  Chloride 98 - 111 mmol/L 106 106 106  CO2 22 - 32 mmol/L '25 27 25  ' Calcium 8.9 - 10.3 mg/dL 8.9 9.4 8.9  Total Protein 6.5 - 8.1 g/dL 6.9 7.1 6.7  Total Bilirubin 0.3 - 1.2 mg/dL 0.2(L) 0.3 0.3  Alkaline Phos 38 - 126 U/L 58 82 63  AST 15 - 41 U/L 32 56(H) 23  ALT 0 - 44 U/L 43 93(H) 38      RADIOGRAPHIC STUDIES: I have personally reviewed the radiological images as listed and agreed with the findings in the report. No results found.   ASSESSMENT & PLAN:  Chelsea Hobbs is a 49 y.o. 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 She underwent left breastlumpectomyand SLNB on 04/04/19.Her DCIS  margins were close (74m). -Given her 1.3cm tumor, node negative disease she completed adjuvant weekly Taxoland anti-Her2 Herceptinfor 12 weeks 05/12/19-08/11/19. Taxol switched to Abraxane for last 2 doses due to skin rash.  -She proceeded with Herceptinmaintenanceq3weeks on 09/01/19 to complete 1 year of treatment.I discussed Herceptin in injection form for her maintenance therapy. She opted to continue infusion.   -She has started Adjuvant Radiation with Dr. KSondra Comeon 08/29/19. She plans to complete on 09/26/19. She has tolerated well with mild fatigue and skin erythema.  -Her last period was in 03/2019 and had menopausal symptoms before starting her chemo. Her 09/12/19 FQuemadolevel was 13.5. She is still pre or peri-menopausal.  -Given the strong ER and PR expression in her pre-menopausal status, I recommend adjuvant endocrine therapy with Tamoxifen for a total of 5-10 years to reduce the risk of cancer recurrence. I discussed to be eligible for aromatase inhibitor she can do ovarian suppression or BSO surgery. Given her moderate risk early stage cancer and her age I do not strongly recommend ovarian suppression. When she becomes postmenopausal we can switch her to AI for an additional 5 years. She is agreeable.   --The potential side effects, which includes but not limited to, hot flash, skin and vaginal dryness, slightly increased risk of cardiovascular disease and cataract, small risk of thrombosis and endometrial cancer, were discussed with her in great details. She agreed to proceed, plan to start on 10/12/19.  -I offered her survivorship clinic visit with NP Lacie, she is interested. Plan for visit in 6 weeks. F/u with me 2-3 months afterward.    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  -She is still able to work as dCopywriter, advertisingbut goes slower. I suggest she start B Complex supplements and exercise her hands and  arms.    3. Osteoporosis  -Per pt when she had her IUD removedin 03/2019. Her recent hormonal test showed [place] was 13.5 (09/12/19). Her last period was in 03/2019 before chemo. Will monitor.  -Per patient her recent DEXA showed early osteoporosis of her hip.  -She plans to start Fosamax. I dicussed another bisphosphonate option of Zometa injection q672monthfor 2 years to reduce which can also reduce her risk of bone metastasis. She is agreeable. Plan to start next visit.    4. Genetic Testingwas negative for pathogenetic nutations  5.  Anxiety  -Previously on Xanaxand Effexor. -Managed by her PCP -She will f/u with SW office as needed about her finances and stress.  6. Colitis  -She has colitis and uses Mesalamine Suppositories. Will monitor on chemo. -She had constipation from chemo. She had flare with laxative. I suggest she use stool softeners once daily and can use Miralax if not enough    7.Insomnia -I recommend she use Melatonin or benadryl. She is willing to try   8. H/o COVID19 (+) in 07/2019, Recovered well.  -She is fine to proceed with COVID19 vaccine soon    PLAN: -Proceed with end of Radiation  -I called in Tamoxifen to start 10/12/19  -Labs reviewed and adequate to proceed with Herceptin today and continue every 3 weeks -she will start Zometa infusion in 9 weeks  -Lab and Survivorship clinic in 9 weeks.    No problem-specific Assessment & Plan notes found for this encounter.   No orders of the defined types were placed in this encounter.  All questions were answered. The patient knows to call the clinic with any problems, questions or concerns. No barriers to learning was detected. The total time spent in the appointment was 30 minutes.     Truitt Merle, MD 09/25/2019   I, Joslyn Devon, am acting as scribe for Truitt Merle, MD.   I have reviewed the above documentation for accuracy and completeness, and I agree with the above.

## 2019-09-19 ENCOUNTER — Ambulatory Visit
Admission: RE | Admit: 2019-09-19 | Discharge: 2019-09-19 | Disposition: A | Discharge status: Home / Self Care | Payer: BC Managed Care – PPO | Source: Ambulatory Visit | Attending: Radiation Oncology | Admitting: Radiation Oncology

## 2019-09-19 ENCOUNTER — Other Ambulatory Visit: Payer: Self-pay

## 2019-09-19 DIAGNOSIS — D0512 Intraductal carcinoma in situ of left breast: Secondary | ICD-10-CM | POA: Diagnosis not present

## 2019-09-19 DIAGNOSIS — Z170 Estrogen receptor positive status [ER+]: Secondary | ICD-10-CM | POA: Diagnosis not present

## 2019-09-19 DIAGNOSIS — Z510 Encounter for antineoplastic radiation therapy: Secondary | ICD-10-CM | POA: Diagnosis not present

## 2019-09-20 ENCOUNTER — Ambulatory Visit
Admission: RE | Admit: 2019-09-20 | Discharge: 2019-09-20 | Disposition: A | Discharge status: Home / Self Care | Payer: BC Managed Care – PPO | Source: Ambulatory Visit | Attending: Radiation Oncology | Admitting: Radiation Oncology

## 2019-09-20 ENCOUNTER — Other Ambulatory Visit: Payer: Self-pay

## 2019-09-20 DIAGNOSIS — Z510 Encounter for antineoplastic radiation therapy: Secondary | ICD-10-CM | POA: Diagnosis not present

## 2019-09-20 DIAGNOSIS — D0512 Intraductal carcinoma in situ of left breast: Secondary | ICD-10-CM | POA: Diagnosis not present

## 2019-09-20 DIAGNOSIS — Z170 Estrogen receptor positive status [ER+]: Secondary | ICD-10-CM | POA: Diagnosis not present

## 2019-09-21 ENCOUNTER — Other Ambulatory Visit: Payer: Self-pay

## 2019-09-21 ENCOUNTER — Ambulatory Visit
Admission: RE | Admit: 2019-09-21 | Discharge: 2019-09-21 | Disposition: A | Discharge status: Home / Self Care | Payer: BC Managed Care – PPO | Source: Ambulatory Visit | Attending: Radiation Oncology | Admitting: Radiation Oncology

## 2019-09-21 DIAGNOSIS — D0512 Intraductal carcinoma in situ of left breast: Secondary | ICD-10-CM | POA: Diagnosis not present

## 2019-09-21 DIAGNOSIS — Z170 Estrogen receptor positive status [ER+]: Secondary | ICD-10-CM | POA: Diagnosis not present

## 2019-09-21 DIAGNOSIS — Z510 Encounter for antineoplastic radiation therapy: Secondary | ICD-10-CM | POA: Diagnosis not present

## 2019-09-22 ENCOUNTER — Ambulatory Visit
Admission: RE | Admit: 2019-09-22 | Discharge: 2019-09-22 | Disposition: A | Discharge status: Home / Self Care | Payer: BC Managed Care – PPO | Source: Ambulatory Visit | Attending: Radiation Oncology | Admitting: Radiation Oncology

## 2019-09-22 ENCOUNTER — Other Ambulatory Visit: Payer: BC Managed Care – PPO

## 2019-09-22 ENCOUNTER — Other Ambulatory Visit: Payer: Self-pay

## 2019-09-22 ENCOUNTER — Ambulatory Visit: Payer: BC Managed Care – PPO

## 2019-09-22 ENCOUNTER — Ambulatory Visit: Payer: BC Managed Care – PPO | Admitting: Hematology

## 2019-09-22 DIAGNOSIS — Z510 Encounter for antineoplastic radiation therapy: Secondary | ICD-10-CM | POA: Diagnosis not present

## 2019-09-22 DIAGNOSIS — C50212 Malignant neoplasm of upper-inner quadrant of left female breast: Secondary | ICD-10-CM | POA: Diagnosis not present

## 2019-09-22 DIAGNOSIS — D0512 Intraductal carcinoma in situ of left breast: Secondary | ICD-10-CM | POA: Diagnosis not present

## 2019-09-22 DIAGNOSIS — Z170 Estrogen receptor positive status [ER+]: Secondary | ICD-10-CM | POA: Diagnosis not present

## 2019-09-25 ENCOUNTER — Other Ambulatory Visit: Payer: Self-pay

## 2019-09-25 ENCOUNTER — Inpatient Hospital Stay: Payer: BC Managed Care – PPO

## 2019-09-25 ENCOUNTER — Inpatient Hospital Stay (HOSPITAL_BASED_OUTPATIENT_CLINIC_OR_DEPARTMENT_OTHER): Payer: BC Managed Care – PPO | Admitting: Hematology

## 2019-09-25 ENCOUNTER — Inpatient Hospital Stay: Payer: BC Managed Care – PPO | Attending: Hematology

## 2019-09-25 ENCOUNTER — Ambulatory Visit: Payer: BC Managed Care – PPO

## 2019-09-25 ENCOUNTER — Ambulatory Visit
Admission: RE | Admit: 2019-09-25 | Discharge: 2019-09-25 | Disposition: A | Discharge status: Home / Self Care | Payer: BC Managed Care – PPO | Source: Ambulatory Visit | Attending: Radiation Oncology | Admitting: Radiation Oncology

## 2019-09-25 ENCOUNTER — Encounter: Payer: Self-pay | Admitting: Hematology

## 2019-09-25 VITALS — BP 122/79 | HR 74 | Temp 98.1°F | Resp 20 | Ht 62.0 in | Wt 128.0 lb

## 2019-09-25 DIAGNOSIS — Z923 Personal history of irradiation: Secondary | ICD-10-CM | POA: Insufficient documentation

## 2019-09-25 DIAGNOSIS — C50212 Malignant neoplasm of upper-inner quadrant of left female breast: Secondary | ICD-10-CM

## 2019-09-25 DIAGNOSIS — Z79899 Other long term (current) drug therapy: Secondary | ICD-10-CM | POA: Insufficient documentation

## 2019-09-25 DIAGNOSIS — Z5112 Encounter for antineoplastic immunotherapy: Secondary | ICD-10-CM | POA: Diagnosis not present

## 2019-09-25 DIAGNOSIS — Z170 Estrogen receptor positive status [ER+]: Secondary | ICD-10-CM | POA: Insufficient documentation

## 2019-09-25 DIAGNOSIS — Z9221 Personal history of antineoplastic chemotherapy: Secondary | ICD-10-CM | POA: Diagnosis not present

## 2019-09-25 DIAGNOSIS — G4700 Insomnia, unspecified: Secondary | ICD-10-CM | POA: Insufficient documentation

## 2019-09-25 DIAGNOSIS — K529 Noninfective gastroenteritis and colitis, unspecified: Secondary | ICD-10-CM | POA: Diagnosis not present

## 2019-09-25 DIAGNOSIS — F419 Anxiety disorder, unspecified: Secondary | ICD-10-CM | POA: Diagnosis not present

## 2019-09-25 DIAGNOSIS — Z8616 Personal history of COVID-19: Secondary | ICD-10-CM | POA: Insufficient documentation

## 2019-09-25 DIAGNOSIS — Z95828 Presence of other vascular implants and grafts: Secondary | ICD-10-CM

## 2019-09-25 DIAGNOSIS — D0512 Intraductal carcinoma in situ of left breast: Secondary | ICD-10-CM | POA: Diagnosis not present

## 2019-09-25 DIAGNOSIS — Z510 Encounter for antineoplastic radiation therapy: Secondary | ICD-10-CM | POA: Diagnosis not present

## 2019-09-25 DIAGNOSIS — G629 Polyneuropathy, unspecified: Secondary | ICD-10-CM | POA: Diagnosis not present

## 2019-09-25 LAB — CMP (CANCER CENTER ONLY)
ALT: 43 U/L (ref 0–44)
AST: 32 U/L (ref 15–41)
Albumin: 3.9 g/dL (ref 3.5–5.0)
Alkaline Phosphatase: 58 U/L (ref 38–126)
Anion gap: 9 (ref 5–15)
BUN: 16 mg/dL (ref 6–20)
CO2: 25 mmol/L (ref 22–32)
Calcium: 8.9 mg/dL (ref 8.9–10.3)
Chloride: 106 mmol/L (ref 98–111)
Creatinine: 0.72 mg/dL (ref 0.44–1.00)
GFR, Est AFR Am: >60 mL/min (ref >60)
GFR, Estimated: >60 mL/min (ref >60)
Glucose, Bld: 88 mg/dL (ref 70–99)
Potassium: 4.1 mmol/L (ref 3.5–5.1)
Sodium: 140 mmol/L (ref 135–145)
Total Bilirubin: 0.2 mg/dL — ABNORMAL LOW (ref 0.3–1.2)
Total Protein: 6.9 g/dL (ref 6.5–8.1)

## 2019-09-25 LAB — CBC WITH DIFFERENTIAL (CANCER CENTER ONLY)
Abs Immature Granulocytes: 0.02 10*3/uL (ref 0.00–0.07)
Basophils Absolute: 0.1 10*3/uL (ref 0.0–0.1)
Basophils Relative: 1 %
Eosinophils Absolute: 0.4 10*3/uL (ref 0.0–0.5)
Eosinophils Relative: 5 %
HCT: 35.9 % — ABNORMAL LOW (ref 36.0–46.0)
Hemoglobin: 12.3 g/dL (ref 12.0–15.0)
Immature Granulocytes: 0 %
Lymphocytes Relative: 23 %
Lymphs Abs: 1.7 10*3/uL (ref 0.7–4.0)
MCH: 31.2 pg (ref 26.0–34.0)
MCHC: 34.3 g/dL (ref 30.0–36.0)
MCV: 91.1 fL (ref 80.0–100.0)
Monocytes Absolute: 0.7 10*3/uL (ref 0.1–1.0)
Monocytes Relative: 9 %
Neutro Abs: 4.7 10*3/uL (ref 1.7–7.7)
Neutrophils Relative %: 62 %
Platelet Count: 291 10*3/uL (ref 150–400)
RBC: 3.94 MIL/uL (ref 3.87–5.11)
RDW: 12.3 % (ref 11.5–15.5)
WBC Count: 7.5 10*3/uL (ref 4.0–10.5)
nRBC: 0 % (ref 0.0–0.2)

## 2019-09-25 MED ORDER — DIPHENHYDRAMINE HCL 25 MG PO CAPS
ORAL_CAPSULE | ORAL | Status: AC
  Filled 2019-09-25: qty 2

## 2019-09-25 MED ORDER — DIPHENHYDRAMINE HCL 25 MG PO CAPS
50.0000 mg | ORAL_CAPSULE | Freq: Once | ORAL | Status: AC
  Administered 2019-09-25: 50 mg via ORAL

## 2019-09-25 MED ORDER — SODIUM CHLORIDE 0.9% FLUSH
10.0000 mL | INTRAVENOUS | Status: DC | PRN
  Administered 2019-09-25: 10 mL
  Filled 2019-09-25: qty 10

## 2019-09-25 MED ORDER — HEPARIN SOD (PORK) LOCK FLUSH 100 UNIT/ML IV SOLN
500.0000 [IU] | Freq: Once | INTRAVENOUS | Status: AC | PRN
  Administered 2019-09-25: 500 [IU]
  Filled 2019-09-25: qty 5

## 2019-09-25 MED ORDER — TAMOXIFEN CITRATE 20 MG PO TABS: 20.0000 mg | ORAL_TABLET | Freq: Every day | ORAL | 2 refills | Status: DC

## 2019-09-25 MED ORDER — ACETAMINOPHEN 325 MG PO TABS
650.0000 mg | ORAL_TABLET | Freq: Once | ORAL | Status: AC
  Administered 2019-09-25: 650 mg via ORAL

## 2019-09-25 MED ORDER — TRASTUZUMAB-DKST CHEMO 150 MG IV SOLR
6.0000 mg/kg | Freq: Once | INTRAVENOUS | Status: AC
  Administered 2019-09-25: 336 mg via INTRAVENOUS
  Filled 2019-09-25: qty 16

## 2019-09-25 MED ORDER — ACETAMINOPHEN 325 MG PO TABS
ORAL_TABLET | ORAL | Status: AC
  Filled 2019-09-25: qty 2

## 2019-09-25 MED ORDER — SODIUM CHLORIDE 0.9 % IV SOLN
Freq: Once | INTRAVENOUS | Status: AC
  Filled 2019-09-25: qty 250

## 2019-09-25 NOTE — Patient Instructions (Signed)
COVID-19 Vaccine Information can be found at: [email or web address] For questions related to vaccine distribution or appointments, please email [email or web address] or call [phone].   Trastuzumab injection for infusion What is this medicine? TRASTUZUMAB (tras TOO zoo mab) is a monoclonal antibody. It is used to treat breast cancer and stomach cancer. This medicine may be used for other purposes; ask your health care provider or pharmacist if you have questions. COMMON BRAND NAME(S): Herceptin, Galvin Proffer, Trazimera What should I tell my health care provider before I take this medicine? They need to know if you have any of these conditions:  heart disease  heart failure  lung or breathing disease, like asthma  an unusual or allergic reaction to trastuzumab, benzyl alcohol, or other medications, foods, dyes, or preservatives  pregnant or trying to get pregnant  breast-feeding How should I use this medicine? This drug is given as an infusion into a vein. It is administered in a hospital or clinic by a specially trained health care professional. Talk to your pediatrician regarding the use of this medicine in children. This medicine is not approved for use in children. Overdosage: If you think you have taken too much of this medicine contact a poison control center or emergency room at once. NOTE: This medicine is only for you. Do not share this medicine with others. What if I miss a dose? It is important not to miss a dose. Call your doctor or health care professional if you are unable to keep an appointment. What may interact with this medicine? This medicine may interact with the following medications:  certain types of chemotherapy, such as daunorubicin, doxorubicin, epirubicin, and idarubicin This list may not describe all possible interactions. Give your health care provider a list of all the  medicines, herbs, non-prescription drugs, or dietary supplements you use. Also tell them if you smoke, drink alcohol, or use illegal drugs. Some items may interact with your medicine. What should I watch for while using this medicine? Visit your doctor for checks on your progress. Report any side effects. Continue your course of treatment even though you feel ill unless your doctor tells you to stop. Call your doctor or health care professional for advice if you get a fever, chills or sore throat, or other symptoms of a cold or flu. Do not treat yourself. Try to avoid being around people who are sick. You may experience fever, chills and shaking during your first infusion. These effects are usually mild and can be treated with other medicines. Report any side effects during the infusion to your health care professional. Fever and chills usually do not happen with later infusions. Do not become pregnant while taking this medicine or for 7 months after stopping it. Women should inform their doctor if they wish to become pregnant or think they might be pregnant. Women of child-bearing potential will need to have a negative pregnancy test before starting this medicine. There is a potential for serious side effects to an unborn child. Talk to your health care professional or pharmacist for more information. Do not breast-feed an infant while taking this medicine or for 7 months after stopping it. Women must use effective birth control with this medicine. What side effects may I notice from receiving this medicine? Side effects that you should report to your doctor or health care professional as soon as possible:  allergic reactions like skin rash, itching or hives, swelling of the face, lips, or tongue  chest pain or palpitations  cough  dizziness  feeling faint or lightheaded, falls  fever  general ill feeling or flu-like symptoms  signs of worsening heart failure like breathing problems;  swelling in your legs and feet  unusually weak or tired Side effects that usually do not require medical attention (report to your doctor or health care professional if they continue or are bothersome):  bone pain  changes in taste  diarrhea  joint pain  nausea/vomiting  weight loss This list may not describe all possible side effects. Call your doctor for medical advice about side effects. You may report side effects to FDA at 1-800-FDA-1088. Where should I keep my medicine? This drug is given in a hospital or clinic and will not be stored at home. NOTE: This sheet is a summary. It may not cover all possible information. If you have questions about this medicine, talk to your doctor, pharmacist, or health care provider.  2020 Elsevier/Gold Standard (2016-06-23 14:37:52)

## 2019-09-26 ENCOUNTER — Telehealth: Payer: Self-pay | Admitting: Hematology

## 2019-09-26 ENCOUNTER — Encounter: Payer: Self-pay | Admitting: *Deleted

## 2019-09-26 ENCOUNTER — Ambulatory Visit
Admission: RE | Admit: 2019-09-26 | Discharge: 2019-09-26 | Disposition: A | Discharge status: Home / Self Care | Payer: BC Managed Care – PPO | Source: Ambulatory Visit | Attending: Radiation Oncology | Admitting: Radiation Oncology

## 2019-09-26 ENCOUNTER — Other Ambulatory Visit: Payer: Self-pay

## 2019-09-26 ENCOUNTER — Encounter: Payer: Self-pay | Admitting: Radiation Oncology

## 2019-09-26 ENCOUNTER — Encounter: Payer: Self-pay | Admitting: Hematology

## 2019-09-26 ENCOUNTER — Other Ambulatory Visit: Payer: Self-pay | Admitting: Hematology

## 2019-09-26 DIAGNOSIS — D0512 Intraductal carcinoma in situ of left breast: Secondary | ICD-10-CM | POA: Diagnosis not present

## 2019-09-26 DIAGNOSIS — Z510 Encounter for antineoplastic radiation therapy: Secondary | ICD-10-CM | POA: Diagnosis not present

## 2019-09-26 DIAGNOSIS — Z170 Estrogen receptor positive status [ER+]: Secondary | ICD-10-CM | POA: Diagnosis not present

## 2019-09-26 DIAGNOSIS — C50212 Malignant neoplasm of upper-inner quadrant of left female breast: Secondary | ICD-10-CM

## 2019-09-26 NOTE — Telephone Encounter (Signed)
Scheduled appt per 3/15 los.  Patient will get an updated appt calendar at their next already scheduled appt.

## 2019-10-10 NOTE — Progress Notes (Signed)
Pharmacist Chemotherapy Monitoring - Follow Up Assessment    I verify that I have reviewed each item in the below checklist:  . Regimen for the patient is scheduled for the appropriate day and plan matches scheduled date. Marland Kitchen Appropriate non-routine labs are ordered dependent on drug ordered. . If applicable, additional medications reviewed and ordered per protocol based on lifetime cumulative doses and/or treatment regimen.   Plan for follow-up and/or issues identified: No . I-vent associated with next due treatment: No . MD and/or nursing notified: No  Chelsea Hobbs 10/10/2019 3:21 PM,

## 2019-10-12 ENCOUNTER — Encounter: Payer: Self-pay | Admitting: *Deleted

## 2019-10-16 ENCOUNTER — Other Ambulatory Visit: Payer: Self-pay

## 2019-10-16 ENCOUNTER — Inpatient Hospital Stay: Payer: BC Managed Care – PPO

## 2019-10-16 ENCOUNTER — Inpatient Hospital Stay: Payer: BC Managed Care – PPO | Attending: Hematology

## 2019-10-16 VITALS — BP 119/76 | HR 73 | Temp 98.2°F | Resp 16

## 2019-10-16 DIAGNOSIS — Z79899 Other long term (current) drug therapy: Secondary | ICD-10-CM | POA: Insufficient documentation

## 2019-10-16 DIAGNOSIS — Z95828 Presence of other vascular implants and grafts: Secondary | ICD-10-CM

## 2019-10-16 DIAGNOSIS — C50212 Malignant neoplasm of upper-inner quadrant of left female breast: Secondary | ICD-10-CM

## 2019-10-16 DIAGNOSIS — G629 Polyneuropathy, unspecified: Secondary | ICD-10-CM | POA: Diagnosis not present

## 2019-10-16 DIAGNOSIS — Z9221 Personal history of antineoplastic chemotherapy: Secondary | ICD-10-CM | POA: Diagnosis not present

## 2019-10-16 DIAGNOSIS — Z170 Estrogen receptor positive status [ER+]: Secondary | ICD-10-CM

## 2019-10-16 DIAGNOSIS — F419 Anxiety disorder, unspecified: Secondary | ICD-10-CM | POA: Insufficient documentation

## 2019-10-16 DIAGNOSIS — Z923 Personal history of irradiation: Secondary | ICD-10-CM | POA: Insufficient documentation

## 2019-10-16 DIAGNOSIS — K529 Noninfective gastroenteritis and colitis, unspecified: Secondary | ICD-10-CM | POA: Diagnosis not present

## 2019-10-16 DIAGNOSIS — Z8616 Personal history of COVID-19: Secondary | ICD-10-CM | POA: Insufficient documentation

## 2019-10-16 DIAGNOSIS — G4700 Insomnia, unspecified: Secondary | ICD-10-CM | POA: Diagnosis not present

## 2019-10-16 DIAGNOSIS — Z5112 Encounter for antineoplastic immunotherapy: Secondary | ICD-10-CM | POA: Insufficient documentation

## 2019-10-16 LAB — CMP (CANCER CENTER ONLY)
ALT: 53 U/L — ABNORMAL HIGH (ref 0–44)
AST: 30 U/L (ref 15–41)
Albumin: 4 g/dL (ref 3.5–5.0)
Alkaline Phosphatase: 64 U/L (ref 38–126)
Anion gap: 9 (ref 5–15)
BUN: 15 mg/dL (ref 6–20)
CO2: 26 mmol/L (ref 22–32)
Calcium: 9.3 mg/dL (ref 8.9–10.3)
Chloride: 108 mmol/L (ref 98–111)
Creatinine: 0.7 mg/dL (ref 0.44–1.00)
GFR, Est AFR Am: >60 mL/min (ref >60)
GFR, Estimated: >60 mL/min (ref >60)
Glucose, Bld: 83 mg/dL (ref 70–99)
Potassium: 4.1 mmol/L (ref 3.5–5.1)
Sodium: 143 mmol/L (ref 135–145)
Total Bilirubin: 0.4 mg/dL (ref 0.3–1.2)
Total Protein: 7.5 g/dL (ref 6.5–8.1)

## 2019-10-16 LAB — CBC WITH DIFFERENTIAL (CANCER CENTER ONLY)
Abs Immature Granulocytes: 0.02 10*3/uL (ref 0.00–0.07)
Basophils Absolute: 0.1 10*3/uL (ref 0.0–0.1)
Basophils Relative: 1 %
Eosinophils Absolute: 0.2 10*3/uL (ref 0.0–0.5)
Eosinophils Relative: 5 %
HCT: 38.3 % (ref 36.0–46.0)
Hemoglobin: 13 g/dL (ref 12.0–15.0)
Immature Granulocytes: 1 %
Lymphocytes Relative: 36 %
Lymphs Abs: 1.6 10*3/uL (ref 0.7–4.0)
MCH: 30.7 pg (ref 26.0–34.0)
MCHC: 33.9 g/dL (ref 30.0–36.0)
MCV: 90.5 fL (ref 80.0–100.0)
Monocytes Absolute: 0.5 10*3/uL (ref 0.1–1.0)
Monocytes Relative: 12 %
Neutro Abs: 2 10*3/uL (ref 1.7–7.7)
Neutrophils Relative %: 45 %
Platelet Count: 269 10*3/uL (ref 150–400)
RBC: 4.23 MIL/uL (ref 3.87–5.11)
RDW: 12.4 % (ref 11.5–15.5)
WBC Count: 4.4 10*3/uL (ref 4.0–10.5)
nRBC: 0 % (ref 0.0–0.2)

## 2019-10-16 MED ORDER — ZOLEDRONIC ACID 4 MG/100ML IV SOLN: 4.0000 mg | Freq: Once | INTRAVENOUS | Status: DC

## 2019-10-16 MED ORDER — SODIUM CHLORIDE 0.9 % IV SOLN
Freq: Once | INTRAVENOUS | Status: AC
  Filled 2019-10-16: qty 250

## 2019-10-16 MED ORDER — TRASTUZUMAB-DKST CHEMO 150 MG IV SOLR
6.0000 mg/kg | Freq: Once | INTRAVENOUS | Status: AC
  Administered 2019-10-16: 336 mg via INTRAVENOUS
  Filled 2019-10-16: qty 16

## 2019-10-16 MED ORDER — HEPARIN SOD (PORK) LOCK FLUSH 100 UNIT/ML IV SOLN
500.0000 [IU] | Freq: Once | INTRAVENOUS | Status: AC | PRN
  Administered 2019-10-16: 500 [IU]
  Filled 2019-10-16: qty 5

## 2019-10-16 MED ORDER — ACETAMINOPHEN 325 MG PO TABS
650.0000 mg | ORAL_TABLET | Freq: Once | ORAL | Status: AC
  Administered 2019-10-16: 650 mg via ORAL

## 2019-10-16 MED ORDER — DIPHENHYDRAMINE HCL 25 MG PO CAPS
ORAL_CAPSULE | ORAL | Status: AC
  Filled 2019-10-16: qty 2

## 2019-10-16 MED ORDER — DIPHENHYDRAMINE HCL 25 MG PO CAPS
50.0000 mg | ORAL_CAPSULE | Freq: Once | ORAL | Status: AC
  Administered 2019-10-16: 50 mg via ORAL

## 2019-10-16 MED ORDER — SODIUM CHLORIDE 0.9% FLUSH
10.0000 mL | INTRAVENOUS | Status: DC | PRN
  Administered 2019-10-16 (×2): 10 mL
  Filled 2019-10-16: qty 10

## 2019-10-16 MED ORDER — ZOLEDRONIC ACID 4 MG/100ML IV SOLN
INTRAVENOUS | Status: AC
  Filled 2019-10-16: qty 100

## 2019-10-16 MED ORDER — SODIUM CHLORIDE 0.9% FLUSH
10.0000 mL | INTRAVENOUS | Status: DC | PRN
  Filled 2019-10-16: qty 10

## 2019-10-16 MED ORDER — ACETAMINOPHEN 325 MG PO TABS
ORAL_TABLET | ORAL | Status: AC
  Filled 2019-10-16: qty 2

## 2019-10-16 NOTE — Patient Instructions (Addendum)
[place] Discharge Instructions for Patients Receiving Chemotherapy  Today you received the following chemotherapy agents: trastuzumab.  To help prevent nausea and vomiting after your treatment, we encourage you to take your nausea medication as prescribed.  If you develop nausea and vomiting that is not controlled by your nausea medication, call the clinic.   BELOW ARE SYMPTOMS THAT SHOULD BE REPORTED IMMEDIATELY:  *FEVER GREATER THAN 100.5 F  *CHILLS WITH OR WITHOUT FEVER  NAUSEA AND VOMITING THAT IS NOT CONTROLLED WITH YOUR NAUSEA MEDICATION  *UNUSUAL SHORTNESS OF BREATH  *UNUSUAL BRUISING OR BLEEDING  TENDERNESS IN MOUTH AND THROAT WITH OR WITHOUT PRESENCE OF ULCERS  *URINARY PROBLEMS  *BOWEL PROBLEMS  UNUSUAL RASH Items with * indicate a potential emergency and should be followed up as soon as possible.  Feel free to call the clinic should you have any questions or concerns. The clinic phone number is [phone].  Please show the [place] at check-in to the Emergency Department and triage nurse.    COVID-19 Vaccine Information can be found at: [email or web address] For questions related to vaccine distribution or appointments, please email [email or web address] or call [phone].   Trastuzumab injection for infusion What is this medicine? TRASTUZUMAB (tras TOO zoo mab) is a monoclonal antibody. It is used to treat breast cancer and stomach cancer. This medicine may be used for other purposes; ask your health care provider or pharmacist if you have questions. COMMON BRAND NAME(S): Herceptin, Galvin Proffer, Trazimera What should I tell my health care provider before I take this medicine? They need to know if you have any of these conditions:  heart disease  heart failure  lung or breathing disease, like asthma  an unusual or allergic reaction to  trastuzumab, benzyl alcohol, or other medications, foods, dyes, or preservatives  pregnant or trying to get pregnant  breast-feeding How should I use this medicine? This drug is given as an infusion into a vein. It is administered in a hospital or clinic by a specially trained health care professional. Talk to your pediatrician regarding the use of this medicine in children. This medicine is not approved for use in children. Overdosage: If you think you have taken too much of this medicine contact a poison control center or emergency room at once. NOTE: This medicine is only for you. Do not share this medicine with others. What if I miss a dose? It is important not to miss a dose. Call your doctor or health care professional if you are unable to keep an appointment. What may interact with this medicine? This medicine may interact with the following medications:  certain types of chemotherapy, such as daunorubicin, doxorubicin, epirubicin, and idarubicin This list may not describe all possible interactions. Give your health care provider a list of all the medicines, herbs, non-prescription drugs, or dietary supplements you use. Also tell them if you smoke, drink alcohol, or use illegal drugs. Some items may interact with your medicine. What should I watch for while using this medicine? Visit your doctor for checks on your progress. Report any side effects. Continue your course of treatment even though you feel ill unless your doctor tells you to stop. Call your doctor or health care professional for advice if you get a fever, chills or sore throat, or other symptoms of a cold or flu. Do not treat yourself. Try to avoid being around people who are sick. You may experience fever, chills and shaking during your first infusion. These effects  are usually mild and can be treated with other medicines. Report any side effects during the infusion to your health care professional. Fever and chills usually  do not happen with later infusions. Do not become pregnant while taking this medicine or for 7 months after stopping it. Women should inform their doctor if they wish to become pregnant or think they might be pregnant. Women of child-bearing potential will need to have a negative pregnancy test before starting this medicine. There is a potential for serious side effects to an unborn child. Talk to your health care professional or pharmacist for more information. Do not breast-feed an infant while taking this medicine or for 7 months after stopping it. Women must use effective birth control with this medicine. What side effects may I notice from receiving this medicine? Side effects that you should report to your doctor or health care professional as soon as possible:  allergic reactions like skin rash, itching or hives, swelling of the face, lips, or tongue  chest pain or palpitations  cough  dizziness  feeling faint or lightheaded, falls  fever  general ill feeling or flu-like symptoms  signs of worsening heart failure like breathing problems; swelling in your legs and feet  unusually weak or tired Side effects that usually do not require medical attention (report to your doctor or health care professional if they continue or are bothersome):  bone pain  changes in taste  diarrhea  joint pain  nausea/vomiting  weight loss This list may not describe all possible side effects. Call your doctor for medical advice about side effects. You may report side effects to FDA at 1-800-FDA-1088. Where should I keep my medicine? This drug is given in a hospital or clinic and will not be stored at home. NOTE: This sheet is a summary. It may not cover all possible information. If you have questions about this medicine, talk to your doctor, pharmacist, or health care provider.  2020 Elsevier/Gold Standard (2016-06-23 14:37:52)   Zoledronic Acid injection (Hypercalcemia, Oncology) What is  this medicine? ZOLEDRONIC ACID (ZOE le dron ik AS id) lowers the amount of calcium loss from bone. It is used to treat too much calcium in your blood from cancer. It is also used to prevent complications of cancer that has spread to the bone. This medicine may be used for other purposes; ask your health care provider or pharmacist if you have questions. COMMON BRAND NAME(S): Zometa What should I tell my health care provider before I take this medicine? They need to know if you have any of these conditions:  aspirin-sensitive asthma  cancer, especially if you are receiving medicines used to treat cancer  dental disease or wear dentures  infection  kidney disease  receiving corticosteroids like dexamethasone or prednisone  an unusual or allergic reaction to zoledronic acid, other medicines, foods, dyes, or preservatives  pregnant or trying to get pregnant  breast-feeding How should I use this medicine? This medicine is for infusion into a vein. It is given by a health care professional in a hospital or clinic setting. Talk to your pediatrician regarding the use of this medicine in children. Special care may be needed. Overdosage: If you think you have taken too much of this medicine contact a poison control center or emergency room at once. NOTE: This medicine is only for you. Do not share this medicine with others. What if I miss a dose? It is important not to miss your dose. Call your doctor or health  care professional if you are unable to keep an appointment. What may interact with this medicine?  certain antibiotics given by injection  NSAIDs, medicines for pain and inflammation, like ibuprofen or naproxen  some diuretics like bumetanide, furosemide  teriparatide  thalidomide This list may not describe all possible interactions. Give your health care provider a list of all the medicines, herbs, non-prescription drugs, or dietary supplements you use. Also tell them if you  smoke, drink alcohol, or use illegal drugs. Some items may interact with your medicine. What should I watch for while using this medicine? Visit your doctor or health care professional for regular checkups. It may be some time before you see the benefit from this medicine. Do not stop taking your medicine unless your doctor tells you to. Your doctor may order blood tests or other tests to see how you are doing. Women should inform their doctor if they wish to become pregnant or think they might be pregnant. There is a potential for serious side effects to an unborn child. Talk to your health care professional or pharmacist for more information. You should make sure that you get enough calcium and vitamin D while you are taking this medicine. Discuss the foods you eat and the vitamins you take with your health care professional. Some people who take this medicine have severe bone, joint, and/or muscle pain. This medicine may also increase your risk for jaw problems or a broken thigh bone. Tell your doctor right away if you have severe pain in your jaw, bones, joints, or muscles. Tell your doctor if you have any pain that does not go away or that gets worse. Tell your dentist and dental surgeon that you are taking this medicine. You should not have major dental surgery while on this medicine. See your dentist to have a dental exam and fix any dental problems before starting this medicine. Take good care of your teeth while on this medicine. Make sure you see your dentist for regular follow-up appointments. What side effects may I notice from receiving this medicine? Side effects that you should report to your doctor or health care professional as soon as possible:  allergic reactions like skin rash, itching or hives, swelling of the face, lips, or tongue  anxiety, confusion, or depression  breathing problems  changes in vision  eye pain  feeling faint or lightheaded, falls  jaw pain, especially  after dental work  mouth sores  muscle cramps, stiffness, or weakness  redness, blistering, peeling or loosening of the skin, including inside the mouth  trouble passing urine or change in the amount of urine Side effects that usually do not require medical attention (report to your doctor or health care professional if they continue or are bothersome):  bone, joint, or muscle pain  constipation  diarrhea  fever  hair loss  irritation at site where injected  loss of appetite  nausea, vomiting  stomach upset  trouble sleeping  trouble swallowing  weak or tired This list may not describe all possible side effects. Call your doctor for medical advice about side effects. You may report side effects to FDA at 1-800-FDA-1088. Where should I keep my medicine? This drug is given in a hospital or clinic and will not be stored at home. NOTE: This sheet is a summary. It may not cover all possible information. If you have questions about this medicine, talk to your doctor, pharmacist, or health care provider.  2020 Elsevier/Gold Standard (2013-11-25 14:19:39)

## 2019-10-30 ENCOUNTER — Encounter: Payer: Self-pay | Admitting: Radiation Oncology

## 2019-10-30 ENCOUNTER — Ambulatory Visit
Admission: RE | Admit: 2019-10-30 | Discharge: 2019-10-30 | Disposition: A | Discharge status: Home / Self Care | Payer: BC Managed Care – PPO | Source: Ambulatory Visit | Attending: Radiation Oncology | Admitting: Radiation Oncology

## 2019-10-30 ENCOUNTER — Other Ambulatory Visit: Payer: Self-pay

## 2019-10-30 VITALS — BP 111/73 | HR 73 | Temp 98.0°F | Resp 18 | Ht 62.0 in | Wt 125.0 lb

## 2019-10-30 DIAGNOSIS — R5383 Other fatigue: Secondary | ICD-10-CM | POA: Diagnosis not present

## 2019-10-30 DIAGNOSIS — Z79899 Other long term (current) drug therapy: Secondary | ICD-10-CM | POA: Diagnosis not present

## 2019-10-30 DIAGNOSIS — Z170 Estrogen receptor positive status [ER+]: Secondary | ICD-10-CM | POA: Diagnosis not present

## 2019-10-30 DIAGNOSIS — Z923 Personal history of irradiation: Secondary | ICD-10-CM | POA: Insufficient documentation

## 2019-10-30 DIAGNOSIS — Z79810 Long term (current) use of selective estrogen receptor modulators (SERMs): Secondary | ICD-10-CM | POA: Diagnosis not present

## 2019-10-30 DIAGNOSIS — C50212 Malignant neoplasm of upper-inner quadrant of left female breast: Secondary | ICD-10-CM | POA: Diagnosis not present

## 2019-10-30 NOTE — Progress Notes (Incomplete)
  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 She reported some mild to moderate fatigue and tenderness of left breast and nipple. The breast was noted to be slightly swollen and hyperpigmented throughout treatment. 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 The patient was given samples of triple antibiotic ointment and nonadherent dressings to put on the nipple areolar area.  Plan: The patient will follow-up with radiation oncology in one month. She was advised to return sooner if her skin reaction worsens.  ________________________________________________   Blair Promise, PhD, MD  This document serves as a record of services personally performed by Gery Pray, MD. It was created on his behalf by Clerance Lav, a trained medical scribe. The creation of this record is based on the scribe's personal observations and the provider's statements to them. This document has been checked and approved by the attending provider.

## 2019-10-30 NOTE — Patient Instructions (Signed)
Coronavirus (COVID-19) Are you at risk?  Are you at risk for the Coronavirus (COVID-19)?  To be considered HIGH RISK for Coronavirus (COVID-19), you have to meet the following criteria:  . Traveled to [place], [place], [place], [place] or [place]; or in the [place] to [place], [place], [place], or [place]; and have fever, cough, and shortness of breath within the last 2 weeks of travel OR . Been in close contact with a person diagnosed with COVID-19 within the last 2 weeks and have fever, cough, and shortness of breath . IF YOU DO NOT MEET THESE CRITERIA, YOU ARE CONSIDERED LOW RISK FOR COVID-19.  What to do if you are HIGH RISK for COVID-19?  . If you are having a medical emergency, call 911. . Seek medical care right away. Before you go to a doctor's office, urgent care or emergency department, call ahead and tell them about your recent travel, contact with someone diagnosed with COVID-19, and your symptoms. You should receive instructions from your physician's office regarding next steps of care.  . When you arrive at healthcare provider, tell the healthcare staff immediately you have returned from visiting [place], [place], [place], [place] or [place]; or traveled in the [place] to [place], [place], [place], or [place]; in the last two weeks or you have been in close contact with a person diagnosed with COVID-19 in the last 2 weeks.   . Tell the health care staff about your symptoms: fever, cough and shortness of breath. . After you have been seen by a medical provider, you will be either: o Tested for (COVID-19) and discharged home on quarantine except to seek medical care if symptoms worsen, and asked to  - Stay home and avoid contact with others until you get your results (4-5 days)  - Avoid travel on public transportation if possible (such as bus, train, or airplane) or o Sent to the Emergency Department by EMS for evaluation, COVID-19 testing, and possible  admission depending on your condition and test results.  What to do if you are LOW RISK for COVID-19?  Reduce your risk of any infection by using the same precautions used for avoiding the common cold or flu:  . Wash your hands often with soap and warm water for at least 20 seconds.  If soap and water are not readily available, use an alcohol-based hand sanitizer with at least 60% alcohol.  . If coughing or sneezing, cover your mouth and nose by coughing or sneezing into the elbow areas of your shirt or coat, into a tissue or into your sleeve (not your hands). . Avoid shaking hands with others and consider head nods or verbal greetings only. . Avoid touching your eyes, nose, or mouth with unwashed hands.  . Avoid close contact with people who are sick. . Avoid places or events with large numbers of people in one location, like concerts or sporting events. . Carefully consider travel plans you have or are making. . If you are planning any travel outside or inside the [place], visit the CDC's Travelers' Health webpage for the latest health notices. . If you have some symptoms but not all symptoms, continue to monitor at home and seek medical attention if your symptoms worsen. . If you are having a medical emergency, call 911.   ADDITIONAL HEALTHCARE OPTIONS FOR PATIENTS  [facility] / e-Visit: [email or web address]         [facility]: [phone]  [facility]: [phone]                   [facility]: [phone]   

## 2019-10-30 NOTE — Progress Notes (Signed)
Radiation Oncology         [phone] ________________________________  Name: Chelsea Hobbs MRN: [id number]  Date: 10/30/2019  DOB: 18-Sep-1970  Follow-Up Visit Note  CC: Chelsea Low, MD  Erroll Luna, MD    ICD-10-CM   1. Malignant neoplasm of upper-inner quadrant of left breast in female, estrogen receptor positive ([place])  C50.212    Z17.0     Diagnosis: StageIA (pT1c, N0, M0)LeftBreast UIQ,Invasive DuctalCarcinoma with DCIS (grade 2), ER+/ PR+/ Her2+, Grade3  Interval Since Last Radiation: One month and three days.  Radiation Treatment Dates: 08/29/2019 through 09/26/2019 Site Technique Total Dose (Gy) Dose per Fx (Gy) Completed Fx Beam Energies  Breast, Left: Breast_Lt_Bst 3D 12/12 2 6/6 6X, 10X  Breast, Left: Breast_Lt 3D 40.05/40.05 2.67 15/15 6X    Narrative:  The patient returns today for routine follow-up. Since the end of treatment, the patient received a chemotherapy infusion of Trastuzumab on 10/16/2019. No other significant interval history.  On review of systems, she reports improving fatigue.  She continues to work her usual schedule. She denies pain or significant itching within the breast area.  She denies any nipple discharge or bleeding or swelling in her left arm or hand.               ALLERGIES:  is allergic to doxycycline and latex.  Meds: Current Outpatient Medications  Medication Sig Dispense Refill  . albuterol (PROVENTIL HFA;VENTOLIN HFA) 108 (90 BASE) MCG/ACT inhaler Inhale 2 puffs into the lungs every 6 (six) hours as needed. For shortness of breath    . alendronate (FOSAMAX) 35 MG tablet alendronate 35 mg tablet    . ALPRAZolam (XANAX) 1 MG tablet Take 0.5 mg by mouth 2 (two) times daily as needed.    . Ascorbic Acid (VITAMIN C) 1000 MG tablet Take 1,000 mg by mouth daily.    . cholecalciferol (VITAMIN D3) 25 MCG (1000 UT) tablet Take 2,000 Units by mouth daily.     . fexofenadine (ALLEGRA) 60 MG tablet Take 60 mg by mouth 2  (two) times daily.    . fluticasone (FLONASE) 50 MCG/ACT nasal spray Place into both nostrils daily.    Marland Kitchen lidocaine-prilocaine (EMLA) cream Apply to affected area once 30 g 3  . mesalamine (CANASA) 1000 MG suppository Place 1,000 mg rectally at bedtime.    . montelukast (SINGULAIR) 10 MG tablet Take 10 mg by mouth daily.    Marland Kitchen omeprazole (PRILOSEC) 20 MG capsule TAKE 1 CAPSULE BY MOUTH EVERY DAY 90 capsule 0  . ondansetron (ZOFRAN) 8 MG tablet Take 1 tablet (8 mg total) by mouth 2 (two) times daily as needed (Nausea or vomiting). 30 tablet 1  . prochlorperazine (COMPAZINE) 10 MG tablet Take 1 tablet (10 mg total) by mouth every 6 (six) hours as needed (Nausea or vomiting). 30 tablet 1  . SUMAtriptan (IMITREX) 100 MG tablet Take 100 mg by mouth.    . tamoxifen (NOLVADEX) 20 MG tablet Take 1 tablet (20 mg total) by mouth daily. 30 tablet 2  . clindamycin-benzoyl peroxide (BENZACLIN) gel Apply topically 2 (two) times daily. (Patient not taking: Reported on 08/21/2019) 25 g 0   No current facility-administered medications for this encounter.    Physical Findings: The patient is in no acute distress. Patient is alert and oriented.  height is '5\' 2"'  (1.575 m) and weight is 125 lb (56.7 kg). Her temperature is 98 F (36.7 C). Her blood pressure is 111/73 and her pulse is 73. Her respiration  is 18 and oxygen saturation is 99%.    Lungs are clear to auscultation bilaterally. Heart has regular rate and rhythm. No palpable cervical, supraclavicular, or axillary adenopathy. Abdomen soft, non-tender, normal bowel sounds. Left breast: Skin is healed well.  Mild hyperpigmentation changes.  Very mild edema.  No dominant mass appreciated breast nipple discharge or bleeding.  Slight erythema to the nipple areolar area.  No signs of infection  Lab Findings: Lab Results  Component Value Date   WBC 4.4 10/16/2019   HGB 13.0 10/16/2019   HCT 38.3 10/16/2019   MCV 90.5 10/16/2019   PLT 269 10/16/2019     Radiographic Findings: No results found.  Impression: StageIA (pT1c, N0, M0)LeftBreast UIQ,Invasive DuctalCarcinoma with DCIS (grade 2), ER+/ PR+/ Her2+, Grade3  The patient is recovering from the effects of radiation.  No evidence of recurrence on clinical exam today  Plan: As needed follow-up in radiation oncology.  Patient will continue with  therapy as above and has started on tamoxifen.  ____________________________________   Blair Promise, PhD, MD  This document serves as a record of services personally performed by Gery Pray, MD. It was created on his behalf by Clerance Lav, a trained medical scribe. The creation of this record is based on the scribe's personal observations and the provider's statements to them. This document has been checked and approved by the attending provider.

## 2019-10-30 NOTE — Progress Notes (Signed)
Pt presents today for f/u with Dr. Sondra Come. Pt denies c/o pain. Pt reports that fatigue is slowing resolving since completing XRT, pt states "its a lot better". Pt reports using vitamin E moisturizer on breast. Breast very faintly hyperpigmented, nipple slightly pink. No swelling appreciated.   BP 111/73 (BP Location: Right Arm, Patient Position: Sitting, Cuff Size: Normal)   Pulse 73   Temp 98 F (36.7 C)   Resp 18   Ht 5\' 2"  (1.575 m)   Wt 125 lb (56.7 kg)   SpO2 99%   BMI 22.86 kg/m   Wt Readings from Last 3 Encounters:  10/30/19 125 lb (56.7 kg)  09/25/19 128 lb (58.1 kg)  08/28/19 126 lb 4.8 oz (57.3 kg)   [name], RN BSN

## 2019-10-31 NOTE — Progress Notes (Signed)
Pharmacist Chemotherapy Monitoring - Follow Up Assessment    I verify that I have reviewed each item in the below checklist:  . Regimen for the patient is scheduled for the appropriate day and plan matches scheduled date. Marland Kitchen Appropriate non-routine labs are ordered dependent on drug ordered. . If applicable, additional medications reviewed and ordered per protocol based on lifetime cumulative doses and/or treatment regimen.   Plan for follow-up and/or issues identified: Yes . I-vent associated with next due treatment: Yes . MD and/or nursing notified: Yes  Chelsea Hobbs 10/31/2019 1:09 PM

## 2019-11-03 ENCOUNTER — Ambulatory Visit: Payer: BC Managed Care – PPO | Attending: Internal Medicine

## 2019-11-03 DIAGNOSIS — Z23 Encounter for immunization: Secondary | ICD-10-CM

## 2019-11-03 NOTE — Progress Notes (Signed)
   Covid-19 Vaccination Clinic  Name:  Chelsea Hobbs    MRN: [id number] DOB: [birth year]  11/03/2019  [name] was observed post Covid-19 immunization for 15 minutes without incident. She was provided with Vaccine Information Sheet and instruction to access the V-Safe system.   [name] was instructed to call 911 with any severe reactions post vaccine: Marland Kitchen Difficulty breathing  . Swelling of face and throat  . A fast heartbeat  . A bad rash all over body  . Dizziness and weakness   Immunizations Administered    Name Date Dose VIS Date Route   Pfizer COVID-19 Vaccine 11/03/2019  3:48 PM 0.3 mL 09/06/2018 Intramuscular   Manufacturer: [place]   Lot: [id number]   [place]: [id number]

## 2019-11-06 ENCOUNTER — Other Ambulatory Visit: Payer: Self-pay

## 2019-11-06 ENCOUNTER — Inpatient Hospital Stay: Payer: BC Managed Care – PPO

## 2019-11-06 VITALS — BP 106/62 | HR 81 | Temp 98.0°F | Resp 18

## 2019-11-06 DIAGNOSIS — C50212 Malignant neoplasm of upper-inner quadrant of left female breast: Secondary | ICD-10-CM

## 2019-11-06 DIAGNOSIS — G4700 Insomnia, unspecified: Secondary | ICD-10-CM | POA: Diagnosis not present

## 2019-11-06 DIAGNOSIS — G629 Polyneuropathy, unspecified: Secondary | ICD-10-CM | POA: Diagnosis not present

## 2019-11-06 DIAGNOSIS — Z5112 Encounter for antineoplastic immunotherapy: Secondary | ICD-10-CM | POA: Diagnosis not present

## 2019-11-06 DIAGNOSIS — Z170 Estrogen receptor positive status [ER+]: Secondary | ICD-10-CM

## 2019-11-06 DIAGNOSIS — Z923 Personal history of irradiation: Secondary | ICD-10-CM | POA: Diagnosis not present

## 2019-11-06 DIAGNOSIS — Z9221 Personal history of antineoplastic chemotherapy: Secondary | ICD-10-CM | POA: Diagnosis not present

## 2019-11-06 DIAGNOSIS — Z79899 Other long term (current) drug therapy: Secondary | ICD-10-CM | POA: Diagnosis not present

## 2019-11-06 DIAGNOSIS — K529 Noninfective gastroenteritis and colitis, unspecified: Secondary | ICD-10-CM | POA: Diagnosis not present

## 2019-11-06 DIAGNOSIS — F419 Anxiety disorder, unspecified: Secondary | ICD-10-CM | POA: Diagnosis not present

## 2019-11-06 DIAGNOSIS — Z8616 Personal history of COVID-19: Secondary | ICD-10-CM | POA: Diagnosis not present

## 2019-11-06 MED ORDER — TRASTUZUMAB-DKST CHEMO 150 MG IV SOLR
6.0000 mg/kg | Freq: Once | INTRAVENOUS | Status: AC
  Administered 2019-11-06: 336 mg via INTRAVENOUS
  Filled 2019-11-06: qty 16

## 2019-11-06 MED ORDER — DIPHENHYDRAMINE HCL 25 MG PO CAPS
50.0000 mg | ORAL_CAPSULE | Freq: Once | ORAL | Status: AC
  Administered 2019-11-06: 50 mg via ORAL

## 2019-11-06 MED ORDER — DIPHENHYDRAMINE HCL 25 MG PO CAPS
ORAL_CAPSULE | ORAL | Status: AC
  Filled 2019-11-06: qty 2

## 2019-11-06 MED ORDER — SODIUM CHLORIDE 0.9 % IV SOLN
Freq: Once | INTRAVENOUS | Status: AC
  Filled 2019-11-06: qty 250

## 2019-11-06 MED ORDER — ACETAMINOPHEN 325 MG PO TABS
650.0000 mg | ORAL_TABLET | Freq: Once | ORAL | Status: AC
  Administered 2019-11-06: 650 mg via ORAL

## 2019-11-06 MED ORDER — ACETAMINOPHEN 325 MG PO TABS
ORAL_TABLET | ORAL | Status: AC
  Filled 2019-11-06: qty 2

## 2019-11-06 MED ORDER — HEPARIN SOD (PORK) LOCK FLUSH 100 UNIT/ML IV SOLN
500.0000 [IU] | Freq: Once | INTRAVENOUS | Status: AC | PRN
  Administered 2019-11-06: 500 [IU]
  Filled 2019-11-06: qty 5

## 2019-11-06 MED ORDER — SODIUM CHLORIDE 0.9% FLUSH
10.0000 mL | INTRAVENOUS | Status: DC | PRN
  Administered 2019-11-06: 10 mL
  Filled 2019-11-06: qty 10

## 2019-11-06 NOTE — Patient Instructions (Signed)
[facility] °Discharge Instructions for Patients Receiving Chemotherapy ° °Today you received the following chemotherapy agents Trastuzumab ° °To help prevent nausea and vomiting after your treatment, we encourage you to take your nausea medication as directed. °  °If you develop nausea and vomiting that is not controlled by your nausea medication, call the clinic.  ° °BELOW ARE SYMPTOMS THAT SHOULD BE REPORTED IMMEDIATELY: °· *FEVER GREATER THAN 100.5 F °· *CHILLS WITH OR WITHOUT FEVER °· NAUSEA AND VOMITING THAT IS NOT CONTROLLED WITH YOUR NAUSEA MEDICATION °· *UNUSUAL SHORTNESS OF BREATH °· *UNUSUAL BRUISING OR BLEEDING °· TENDERNESS IN MOUTH AND THROAT WITH OR WITHOUT PRESENCE OF ULCERS °· *URINARY PROBLEMS °· *BOWEL PROBLEMS °· UNUSUAL RASH °Items with * indicate a potential emergency and should be followed up as soon as possible. ° °Feel free to call the clinic should you have any questions or concerns. The clinic phone number is [phone]. ° °Please show the CHEMO ALERT CARD at check-in to the Emergency Department and triage nurse. ° ° °

## 2019-11-13 ENCOUNTER — Other Ambulatory Visit (HOSPITAL_COMMUNITY): Payer: BC Managed Care – PPO

## 2019-11-20 ENCOUNTER — Ambulatory Visit (HOSPITAL_COMMUNITY)
Admission: RE | Admit: 2019-11-20 | Discharge: 2019-11-20 | Disposition: A | Discharge status: Home / Self Care | Payer: BC Managed Care – PPO | Source: Ambulatory Visit | Attending: Hematology | Admitting: Hematology

## 2019-11-20 ENCOUNTER — Other Ambulatory Visit: Payer: Self-pay

## 2019-11-20 DIAGNOSIS — Z170 Estrogen receptor positive status [ER+]: Secondary | ICD-10-CM | POA: Diagnosis not present

## 2019-11-20 DIAGNOSIS — Z01818 Encounter for other preprocedural examination: Secondary | ICD-10-CM | POA: Diagnosis not present

## 2019-11-20 DIAGNOSIS — C50212 Malignant neoplasm of upper-inner quadrant of left female breast: Secondary | ICD-10-CM | POA: Diagnosis not present

## 2019-11-20 NOTE — Progress Notes (Signed)
  Echocardiogram 2D Echocardiogram has been performed.  Chelsea Hobbs M 11/20/2019, 11:02 AM

## 2019-11-21 NOTE — Progress Notes (Signed)
Pharmacist Chemotherapy Monitoring - Follow Up Assessment    I verify that I have reviewed each item in the below checklist:  . Regimen for the patient is scheduled for the appropriate day and plan matches scheduled date. Marland Kitchen Appropriate non-routine labs are ordered dependent on drug ordered. . If applicable, additional medications reviewed and ordered per protocol based on lifetime cumulative doses and/or treatment regimen.   Plan for follow-up and/or issues identified: No . I-vent associated with next due treatment: No . MD and/or nursing notified: No  Philomena Course 11/21/2019 10:24 AM

## 2019-11-27 ENCOUNTER — Ambulatory Visit: Payer: BC Managed Care – PPO | Admitting: Hematology

## 2019-11-27 ENCOUNTER — Other Ambulatory Visit: Payer: Self-pay

## 2019-11-27 ENCOUNTER — Inpatient Hospital Stay (HOSPITAL_BASED_OUTPATIENT_CLINIC_OR_DEPARTMENT_OTHER): Payer: BC Managed Care – PPO | Admitting: Nurse Practitioner

## 2019-11-27 ENCOUNTER — Encounter: Payer: Self-pay | Admitting: Nurse Practitioner

## 2019-11-27 ENCOUNTER — Other Ambulatory Visit: Payer: BC Managed Care – PPO

## 2019-11-27 ENCOUNTER — Inpatient Hospital Stay: Payer: BC Managed Care – PPO | Attending: Hematology

## 2019-11-27 ENCOUNTER — Encounter: Payer: Self-pay | Admitting: *Deleted

## 2019-11-27 ENCOUNTER — Inpatient Hospital Stay: Payer: BC Managed Care – PPO

## 2019-11-27 ENCOUNTER — Ambulatory Visit: Payer: BC Managed Care – PPO

## 2019-11-27 DIAGNOSIS — D241 Benign neoplasm of right breast: Secondary | ICD-10-CM

## 2019-11-27 DIAGNOSIS — G629 Polyneuropathy, unspecified: Secondary | ICD-10-CM | POA: Diagnosis not present

## 2019-11-27 DIAGNOSIS — Z9221 Personal history of antineoplastic chemotherapy: Secondary | ICD-10-CM | POA: Diagnosis not present

## 2019-11-27 DIAGNOSIS — Z79810 Long term (current) use of selective estrogen receptor modulators (SERMs): Secondary | ICD-10-CM | POA: Diagnosis not present

## 2019-11-27 DIAGNOSIS — Z5112 Encounter for antineoplastic immunotherapy: Secondary | ICD-10-CM | POA: Diagnosis not present

## 2019-11-27 DIAGNOSIS — Z170 Estrogen receptor positive status [ER+]: Secondary | ICD-10-CM | POA: Diagnosis not present

## 2019-11-27 DIAGNOSIS — Z95828 Presence of other vascular implants and grafts: Secondary | ICD-10-CM

## 2019-11-27 DIAGNOSIS — Z923 Personal history of irradiation: Secondary | ICD-10-CM | POA: Diagnosis not present

## 2019-11-27 DIAGNOSIS — C50212 Malignant neoplasm of upper-inner quadrant of left female breast: Secondary | ICD-10-CM

## 2019-11-27 DIAGNOSIS — Z79899 Other long term (current) drug therapy: Secondary | ICD-10-CM | POA: Insufficient documentation

## 2019-11-27 DIAGNOSIS — G620 Drug-induced polyneuropathy: Secondary | ICD-10-CM | POA: Diagnosis not present

## 2019-11-27 DIAGNOSIS — T451X5A Adverse effect of antineoplastic and immunosuppressive drugs, initial encounter: Secondary | ICD-10-CM

## 2019-11-27 LAB — CBC WITH DIFFERENTIAL (CANCER CENTER ONLY)
Abs Immature Granulocytes: 0.02 10*3/uL (ref 0.00–0.07)
Basophils Absolute: 0.1 10*3/uL (ref 0.0–0.1)
Basophils Relative: 1 %
Eosinophils Absolute: 0.2 10*3/uL (ref 0.0–0.5)
Eosinophils Relative: 4 %
HCT: 38 % (ref 36.0–46.0)
Hemoglobin: 13.1 g/dL (ref 12.0–15.0)
Immature Granulocytes: 0 %
Lymphocytes Relative: 35 %
Lymphs Abs: 2.2 10*3/uL (ref 0.7–4.0)
MCH: 30.3 pg (ref 26.0–34.0)
MCHC: 34.5 g/dL (ref 30.0–36.0)
MCV: 87.8 fL (ref 80.0–100.0)
Monocytes Absolute: 0.5 10*3/uL (ref 0.1–1.0)
Monocytes Relative: 7 %
Neutro Abs: 3.4 10*3/uL (ref 1.7–7.7)
Neutrophils Relative %: 53 %
Platelet Count: 290 10*3/uL (ref 150–400)
RBC: 4.33 MIL/uL (ref 3.87–5.11)
RDW: 12.4 % (ref 11.5–15.5)
WBC Count: 6.4 10*3/uL (ref 4.0–10.5)
nRBC: 0 % (ref 0.0–0.2)

## 2019-11-27 LAB — CMP (CANCER CENTER ONLY)
ALT: 26 U/L (ref 0–44)
AST: 20 U/L (ref 15–41)
Albumin: 4 g/dL (ref 3.5–5.0)
Alkaline Phosphatase: 46 U/L (ref 38–126)
Anion gap: 9 (ref 5–15)
BUN: 12 mg/dL (ref 6–20)
CO2: 26 mmol/L (ref 22–32)
Calcium: 9.4 mg/dL (ref 8.9–10.3)
Chloride: 104 mmol/L (ref 98–111)
Creatinine: 0.81 mg/dL (ref 0.44–1.00)
GFR, Est AFR Am: >60 mL/min (ref >60)
GFR, Estimated: >60 mL/min (ref >60)
Glucose, Bld: 93 mg/dL (ref 70–99)
Potassium: 4.4 mmol/L (ref 3.5–5.1)
Sodium: 139 mmol/L (ref 135–145)
Total Bilirubin: 0.3 mg/dL (ref 0.3–1.2)
Total Protein: 7 g/dL (ref 6.5–8.1)

## 2019-11-27 MED ORDER — SODIUM CHLORIDE 0.9 % IV SOLN
Freq: Once | INTRAVENOUS | Status: AC
  Filled 2019-11-27: qty 250

## 2019-11-27 MED ORDER — HEPARIN SOD (PORK) LOCK FLUSH 100 UNIT/ML IV SOLN
500.0000 [IU] | Freq: Once | INTRAVENOUS | Status: AC | PRN
  Administered 2019-11-27: 500 [IU]
  Filled 2019-11-27: qty 5

## 2019-11-27 MED ORDER — SODIUM CHLORIDE 0.9% FLUSH
10.0000 mL | INTRAVENOUS | Status: DC | PRN
  Administered 2019-11-27: 10 mL
  Filled 2019-11-27: qty 10

## 2019-11-27 MED ORDER — ZOLEDRONIC ACID 4 MG/100ML IV SOLN
4.0000 mg | Freq: Once | INTRAVENOUS | Status: AC
  Administered 2019-11-27: 4 mg via INTRAVENOUS
  Filled 2019-11-27: qty 100

## 2019-11-27 MED ORDER — DIPHENHYDRAMINE HCL 25 MG PO CAPS
50.0000 mg | ORAL_CAPSULE | Freq: Once | ORAL | Status: AC
  Administered 2019-11-27: 50 mg via ORAL

## 2019-11-27 MED ORDER — ACETAMINOPHEN 325 MG PO TABS
650.0000 mg | ORAL_TABLET | Freq: Once | ORAL | Status: AC
  Administered 2019-11-27: 650 mg via ORAL

## 2019-11-27 MED ORDER — TRASTUZUMAB-DKST CHEMO 150 MG IV SOLR
6.0000 mg/kg | Freq: Once | INTRAVENOUS | Status: AC
  Administered 2019-11-27: 336 mg via INTRAVENOUS
  Filled 2019-11-27: qty 16

## 2019-11-27 NOTE — Progress Notes (Signed)
Patient Care Team: Chelsea Low, MD as PCP - General (Internal Medicine) Chelsea Germany, RN as Oncology Nurse Navigator Chelsea Kaufmann, RN as Oncology Nurse Navigator Chelsea Luna, MD as Consulting Physician (General Surgery) Chelsea Merle, MD as Consulting Physician (Hematology) Chelsea Pray, MD as Consulting Physician (Radiation Oncology) Chelsea Feeling, NP as Nurse Practitioner (Nurse Practitioner)  CLINIC:  Survivorship   REASON FOR VISIT:  Routine follow-up post-treatment for a recent history of breast cancer.  BRIEF ONCOLOGIC HISTORY:  Oncology History Overview Note  Cancer Staging Malignant neoplasm of upper-inner quadrant of left breast in female, estrogen receptor positive ([place]) Staging form: Breast, AJCC 8th Edition - Clinical stage from 03/22/2019: Stage IA (cT1c, cN0, cM0, G3, ER+, PR+, HER2+) - Signed by Chelsea Merle, MD on 03/22/2019    Malignant neoplasm of upper-inner quadrant of left breast in female, estrogen receptor positive ([place])  03/10/2019 Mammogram   Diagnostic Mammogram 03/10/19  IMPRESSION: 1. Suspicious mass in the 10 o'clock position of the left breast, 3cm from the nipple measuring 12x7x65m. Biopsy is indicated. 2. Indeterminate mass in the 5:30 o'clock position of the left breast, 5cm from the nipple, measuring 9x6x734m Although this may reflect a fibroadenoma, tissue sampling is recommended.   03/16/2019 Initial Biopsy   Diagnosis 03/16/19  1. 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 SEE NOTE 2. Breast, left, needle core biopsy, 5:30 o'clock - FIBROADENOMA   03/16/2019 Receptors her2   Results: IMMUNOHISTOCHEMICAL AND MORPHOMETRIC ANALYSIS PERFORMED MANUALLY The tumor cells are POSITIVE for Her2 (3+). Estrogen Receptor: 100%, POSITIVE, STRONG STAINING INTENSITY Progesterone Receptor: 100%, POSITIVE, STRONG STAINING INTENSITY Proliferation Marker Ki67: 60%   03/21/2019 Initial Diagnosis   Malignant  neoplasm of upper-inner quadrant of left breast in [place], [place]  03/22/2019 Cancer Staging   Staging form: Breast, AJCC 8th Edition - Clinical stage from 03/22/2019: Stage IA (cT1c, cN0, cM0, G3, ER+, PR+, HER2+) - Signed by FeTruitt MerleMD on 03/22/2019   03/31/2019 Genetic Testing   VUS in CTNNA1 called c.1547-3C>T (Intronic) was identified on the [facility] Common Hereditary Cancers Panel. The Common Hereditary Cancers Panel offered by [facility] includes sequencing and/or deletion duplication testing of the following 47 genes: APC, ATM, AXIN2, BARD1, BMPR1A, BRCA1, BRCA2, BRIP1, CDH1, CDKN2A (p14ARF), CDKN2A (p16INK4a), CKD4, CHEK2, CTNNA1, DICER1, EPCAM (Deletion/duplication testing only), GREM1 (promoter region deletion/duplication testing only), KIT, MEN1, MLH1, MSH2, MSH3, MSH6, MUTYH, NBN, NF1, NHTL1, PALB2, PDGFRA, PMS2, POLD1, POLE, PTEN, RAD50, RAD51C, RAD51D, SDHB, SDHC, SDHD, SMAD4, SMARCA4. STK11, TP53, TSC1, TSC2, and VHL.  The following genes were evaluated for sequence changes only: SDHA and HOXB13 c.251G>A variant only. The report date is 03/30/2019.    03/31/2019 Breast MRI   MRI breast 03/31/19  IMPRESSION: 1. 5 millimeter enhancing oval mass in the anteromedial aspect of the RIGHT breast warranting further evaluation. Given its anterior depth and visibility on noncontrast images, ultrasound is recommended to determine if there is a sonographic correlate. If no sonographic correlate is identified to guide biopsy, MRI biopsy of the RIGHT breast is recommended. 2. Known malignancy in the UPPER INNER QUADRANT of the LEFT breast measuring 1.0 centimeters. 3. 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   04/04/2019 Surgery   RADIOCATIVE SEED GUIDED LEFT BREAST LUMPECTOMY, LEFT AXILLARY SENTINEL LYMPH NODE BIOPSY and PAC placement  By Dr. CoBrantley Stage9/22/20    04/04/2019 Pathology Results   DIAGNOSIS: 04/04/19  A. BREAST, LEFT,  LUMPECTOMY:  -  Invasive ductal carcinoma, Nottingham grade 3 of 3, 1.3 cm  -  Ductal carcinoma in-situ, intermediate grade  -  Margins uninvolved by carcinoma (0.1 cm; posterior margin; see  comment)  -  Previous biopsy site changes present  -  See oncology table and comment below   B. BREAST, LEFT, ADDITIONAL MEDIAL MARGIN, EXCISION:  -  Fibrocystic changes  -  No residual carcinoma identified   C. LYMPH NODE, LEFT AXILLARY, SENTINEL, BIOPSY:  -   No carcinoma identified in one lymph node (0/1)    04/04/2019 Cancer Staging   Staging form: Breast, AJCC 8th Edition - Pathologic stage from 04/04/2019: Stage IA (pT1c, pN0, cM0, G3, ER+, PR+, HER2+) - Signed by Chelsea Merle, MD on 04/21/2019   04/11/2019 Pathology Results   Diagnosis Breast, right, needle core biopsy, 2 o'clock - SMALL INTRADUCTAL PAPILLOMA. SEE NOTE - SMALL FIBROADENOMA - NEGATIVE FOR CARCINOMA   05/12/2019 -  Chemotherapy   Adjuvant Weekly Taxol and Herceptin for 12 weeks starting 05/12/19, switched to Abraxane on 08/04/19 due to skin rash. Completed on 08/11/19.  Followed by maintenance Herceptin q3weeks to complete 1 year treatment (from 05/12/19) starting 09/01/19.     08/29/2019 - 09/26/2019 Radiation Therapy   Adjuvant radiation per Dr. Sondra Hobbs    10/12/2019 -  Anti-estrogen oral therapy   Tamoxifen 40m daily starting 10/12/19   11/27/2019 Survivorship   SCP delivered by LCira Rue NP      INTERVAL HISTORY:  [facility] to review her survivorship care plan detailing her treatment course for breast cancer, as well as monitoring long-term side effects of that treatment, education regarding health maintenance, screening, and overall wellness and health promotion.     Chelsea Hobbs doing well overall. She continues tamoxifen. She has mild body aches but better compared to what she experienced with chemo. She feels more emotional now. She was just starting to  think about herself and her mother got sick, Chelsea Mercuryhas to stay with her and take care of her. She got a little tearful. Takes xanax PRN which helps. She felt her fingers/hands getting weak on chemo like her muscles were fatigued. Not exactly numbness or tingling, this is residual and has to concentrate harder at work. 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 This is concerning her bc it's her livelihood. Denies concerns in her breast such as new lump or nipple discharge. Denies changes in appetite or bowel habits. Had a menstrual period 2 months ago but none recently. Otherwise, denies fever, chills, cough, chest pain, dyspnea, leg edema, or new concerns.    ONCOLOGY TREATMENT TEAM:  1. [facility] 2. Medical Oncologist: Dr. FBurr Medico 3. Radiation Oncologist: Dr. KSondra Hobbs    PAST MEDICAL/SURGICAL HISTORY:  Past Medical History:  Diagnosis Date  . Asthma   . Family history of breast cancer   . Family history of lung cancer    Past Surgical History:  Procedure Laterality Date  . BREAST BIOPSY Right   . BREAST LUMPECTOMY WITH RADIOACTIVE SEED AND SENTINEL LYMPH NODE BIOPSY Left 04/04/2019   Procedure: RADIOCATIVE SEED GUIDED LEFT BREAST LUMPECTOMY, LEFT AXILLARY SENTINEL LYMPH NODE BIOPSY;  Surgeon: CErroll Luna MD;  [facility]  Service: General;  Laterality: Left;  . NASAL SINUS SURGERY    . PORTACATH PLACEMENT N/A 04/04/2019   Procedure: INSERTION PORT-A-CATH WITH ULTRASOUND;  Surgeon: Chelsea Luna, MD;  Location: [facility];  Service: General;  Laterality: N/A;     ALLERGIES:  Allergies  Allergen Reactions  . Doxycycline Diarrhea  . Latex Dermatitis     CURRENT MEDICATIONS:  Outpatient Encounter Medications as of 11/27/2019  Medication Sig  . albuterol (PROVENTIL HFA;VENTOLIN HFA) 108 (90 BASE) MCG/ACT inhaler Inhale 2 puffs into the lungs every 6 (six) hours as needed. For shortness of  breath  . alendronate (FOSAMAX) 35 MG tablet alendronate 35 mg tablet  . ALPRAZolam (XANAX) 1 MG tablet Take 0.5 mg by mouth 2 (two) times daily as needed.  . Ascorbic Acid (VITAMIN C) 1000 MG tablet Take 1,000 mg by mouth daily.  . cholecalciferol (VITAMIN D3) 25 MCG (1000 UT) tablet Take 2,000 Units by mouth daily.   . clindamycin-benzoyl peroxide (BENZACLIN) gel Apply topically 2 (two) times daily. (Patient not taking: Reported on 08/21/2019)  . diazepam (VALIUM) 5 MG tablet Take 5 mg by mouth 2 (two) times daily as needed.  . fexofenadine (ALLEGRA) 60 MG tablet Take 60 mg by mouth 2 (two) times daily.  . fluticasone (FLONASE) 50 MCG/ACT nasal spray Place into both nostrils daily.  Marland Kitchen lidocaine-prilocaine (EMLA) cream Apply to affected area once  . mesalamine (CANASA) 1000 MG suppository Place 1,000 mg rectally at bedtime.  . montelukast (SINGULAIR) 10 MG tablet Take 10 mg by mouth daily.  Marland Kitchen omeprazole (PRILOSEC) 20 MG capsule TAKE 1 CAPSULE BY MOUTH EVERY DAY  . ondansetron (ZOFRAN) 8 MG tablet Take 1 tablet (8 mg total) by mouth 2 (two) times daily as needed (Nausea or vomiting).  . prochlorperazine (COMPAZINE) 10 MG tablet Take 1 tablet (10 mg total) by mouth every 6 (six) hours as needed (Nausea or vomiting).  . SUMAtriptan (IMITREX) 100 MG tablet Take 100 mg by mouth.  . tamoxifen (NOLVADEX) 20 MG tablet Take 1 tablet (20 mg total) by mouth daily.   No facility-administered encounter medications on file as of 11/27/2019.     ONCOLOGIC FAMILY HISTORY:  Family History  Problem Relation Age of Onset  . Breast cancer Mother 23  . Cancer Maternal Grandmother 60       lung cancer  . Cancer Maternal Grandfather 84       lung cancer  . Breast cancer Paternal Grandmother 74     GENETIC COUNSELING/TESTING: Yes, A Variant of Uncertain Significance, c.1547-3C>T (Intronic), was identified in CTNNA1.  SOCIAL HISTORY:  Chelsea Hobbs lives in [place] and works full time as a  Copywriter, advertising   PHYSICAL EXAMINATION:  Vital Signs: Wt 123.0 lbs, BP 123/82, P 82, R 18, T 98.0, 02 100% RA  General: Well-nourished, well-appearing female in no acute distress.   HEENT:  Sclerae anicteric.   Lymph: No cervical, supraclavicular, or infraclavicular lymphadenopathy noted on palpation.  Cardiovascular: Regular rate and rhythm. Respiratory: Clear to auscultation bilaterally; breathing non-labored.  Neuro: No focal deficits. Steady gait.  Psych: Mood and affect normal and appropriate for situation.  Extremities: No edema. MSK: Full range of motion in bilateral upper extremities Skin: Warm and dry. Breast: s/p left lumpectomy. Incisions completely healed. Lumpy breast tissue in the right at 6 oclock. No palpable mass in either breast or axilla that I could appreciate.   LABORATORY DATA:  CBC Latest Ref Rng & Units 11/27/2019 10/16/2019 09/25/2019  WBC 4.0 - 10.5 K/uL 6.4 4.4 7.5  Hemoglobin 12.0 - 15.0 g/dL 13.1 13.0 12.3  Hematocrit 36.0 - 46.0 % 38.0 38.3 35.9(L)  Platelets 150 - 400 K/uL 290 269 291   CMP Latest Ref Rng & Units 11/27/2019 10/16/2019 09/25/2019  Glucose 70 - 99 mg/dL 93 83 88  BUN 6 - 20 mg/dL _0 Creatinine 0.44 - 1.00 mg/dL 0.81 0.70 0.72  Sodium 135 - 145 mmol/L 139 143 140  Potassium 3.5 - 5.1 mmol/L 4.4 4.1 4.1  Chloride 98 - 111 mmol/L 104 108 106  CO2 22 - 32 mmol/L _1 Calcium 8.9 - 10.3 mg/dL 9.4 9.3 8.9  Total Protein 6.5 - 8.1 g/dL 7.0 7.5 6.9  Total Bilirubin 0.3 - 1.2 mg/dL 0.3 0.4 0.2(L)  Alkaline Phos 38 - 126 U/L 46 64 58  AST 15 - 41 U/L 20 30 32  ALT 0 - 44 U/L 26 53(H) 43    DIAGNOSTIC IMAGING:  None for this visit.      ASSESSMENT AND PLAN:  Chelsea Hobbs is a pleasant 49 y.o. female with Stage IA left breast invasive ductal carcinoma, ER+/PR+/HER2+, diagnosed in 02/2019, treated with lumpectomy, adjuvant chemotherapy, radiation therapy, and anti-estrogen therapy with tamoxifen starting in 10/2019.  She presents to the  Survivorship Clinic for our initial meeting and routine follow-up post-completion of treatment for breast cancer.    1. Stage IA left breast cancer:  Chelsea Hobbs is continuing to recover from definitive treatment for breast cancer. She will follow-up with her medical oncologist, Dr. Burr Medico in 01/2020 during Herceptin therapy with history and physical exam per surveillance protocol.  She will continue her anti-estrogen therapy with Tamoxifen. Thus far, she is tolerating well with mild bone aches and emotional lability, controlled with xanax PRN. We reviewed mental health and stress-reducing behaviors. She was instructed to make Dr. Burr Medico or myself aware if she begins to experience any worsening side effects of the medication and I could see her back in clinic to help manage those side effects, as needed. Though the incidence is Hobbs, there is an associated risk of endometrial cancer with anti-estrogen therapies like Tamoxifen.  Chelsea Hobbs was encouraged to contact Dr. Burr Medico or myself with any abnormal vaginal bleeding while taking Tamoxifen. She is perimenopausal, LMP 2 months ago. Today, a comprehensive survivorship care plan and treatment summary was reviewed with the patient today detailing her breast cancer diagnosis, treatment course, potential late/long-term effects of treatment, appropriate follow-up care with recommendations for the future, and patient education resources.  A copy of this summary, along with a letter will be sent to the patient's primary care provider via In Basket message after today's visit.    2. Mild peripheral neuropathy, secondary to taxol/abraxane: Denies numbness or tingling, more a sensation of muscle fatigue in her hands causing decreased dexterity. She works with her hands as Copywriter, advertising, this is stressful for her. I recommend to take B complex vitamin. I will refer her to PT.   3. 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 She plans to have this surgically removed with port-a-cath by the end of the year. She will discuss with Dr. Brantley Stage. My exam shows no discrete mass in either breast, but does show lumpiness in the right lower breast, consistent with a fibroadenoma biopsied on 04/22/07. I think given her history, she would benefit from screening breast MRI with mammogram in 02/2020. We reviewed the possibility of false positives and need for additional biopsy. She understands and prefers to proceed.  4. Bone health:   Chelsea Hobbs reportedly has osteoporosis and planned to start bisphosphonate. She has no history of periodontal disease. She will start zometa today.   In the meantime, she was encouraged to increase her consumption of foods rich in calcium, as well as increase her weight-bearing activities.  She was given education on specific activities to promote bone health.  5. Cancer screening:  Due to Chelsea Hobbs's history and her age, she should receive screening for skin cancers, colon cancer, and gynecologic cancers.  The information and recommendations are listed on the patient's comprehensive care plan/treatment summary and were reviewed in detail with the patient.    6. Health maintenance and wellness promotion: Chelsea Hobbs was encouraged to consume 5-7 servings of fruits and vegetables per day. We reviewed the "Nutrition Rainbow" handout, as well as the handout "Take Control of Your Health and Reduce Your Cancer Risk" from the [place].  She was also encouraged to engage in moderate to vigorous exercise for 30 minutes per day most days of the week. We discussed the [employer] fitness program, which is designed for cancer survivors to help them become more physically fit after cancer treatments.  She was instructed to limit her alcohol consumption and continue to abstain from tobacco use.     7. Support services/counseling: It is not uncommon for this  period of the patient's cancer care trajectory to be one of many emotions and stressors.  We discussed an opportunity for her to participate in the next session of [facility] ("Finding Your New Normal") support group series designed for patients after they have completed treatment.   Chelsea Hobbs was encouraged to take advantage of our many other support services programs, support groups, and/or counseling in coping with her new life as a cancer survivor after completing anti-cancer treatment.  She was offered support today through active listening and expressive supportive counseling.  She was given information regarding our available services and encouraged to contact me with any questions or for help enrolling in any of our support group/programs.    Dispo:   -Return to cancer center q3 weeks for maintenance herceptin, f/u every 2-3 treatments  -Mammogram due in 02/2020 with screening MRI if insurance will cover, orders placed today -Follow up with surgery as needed  -She is welcome to return back to the Survivorship Clinic at any time; no additional follow-up needed at this time.  -Consider referral back to survivorship as a long-term survivor for continued surveillance  A total of (40) minutes of face-to-face time was spent with this patient with greater than 50% of that time in counseling and care-coordination.   [name], NP Survivorship Program [facility] [phone]  Orders Placed This Encounter  Procedures  . MM DIAG BREAST TOMO BILATERAL    Standing Status:   Future    Standing Expiration Date:   11/26/2020    Order Specific Question:   Reason for Exam (SYMPTOM  OR DIAGNOSIS REQUIRED)    Answer:   h/o left breast cancer s/p lumpectomy, chemo, radiation. h/o right fibroadenoma and intraductal papilloma    Order Specific Question:   Is the patient pregnant?    Answer:   No    Order Specific Question:   Preferred imaging location?    Answer:   [facility]  . MR  BREAST BILATERAL W WO CONTRAST INC CAD    Standing Status:   Future    Standing Expiration Date:   01/26/2021  Order Specific Question:   ** REASON FOR EXAM (FREE TEXT)    Answer:   h/o left breast cancer s/p lumpectomy, chemo, radiation. h/o right fibroadenoma (6 oclock) and intraductal papilloma (2 0clock); surgical planning    Order Specific Question:   If indicated for the ordered procedure, I authorize the administration of contrast media per Radiology protocol    Answer:   Yes    Order Specific Question:   What is the patient's sedation requirement?    Answer:   No Sedation    Order Specific Question:   Does the patient have a pacemaker or implanted devices?    Answer:   No    Order Specific Question:   Radiology Contrast Protocol - do NOT remove file path    Answer:   [email or web address]    Order Specific Question:   Preferred imaging location?    Answer:   [facility] (table limit - 550 lbs)  . Ambulatory referral to Physical Therapy    Referral Priority:   Routine    Referral Type:   Physical Medicine    Referral Reason:   Specialty Services Required    Requested Specialty:   Physical Therapy    Number of Visits Requested:   1   Note: PRIMARY CARE PROVIDER Chelsea Hobbs, [place] [phone]

## 2019-11-27 NOTE — Patient Instructions (Addendum)
[facility] Discharge Instructions for Patients Receiving Chemotherapy  Today you received the following Immunotherapy agent: Trastuzumab  To help prevent nausea and vomiting after your treatment, we encourage you to take your nausea medication as directed by your MD.   If you develop nausea and vomiting that is not controlled by your nausea medication, call the clinic.   BELOW ARE SYMPTOMS THAT SHOULD BE REPORTED IMMEDIATELY:  *FEVER GREATER THAN 100.5 F  *CHILLS WITH OR WITHOUT FEVER  NAUSEA AND VOMITING THAT IS NOT CONTROLLED WITH YOUR NAUSEA MEDICATION  *UNUSUAL SHORTNESS OF BREATH  *UNUSUAL BRUISING OR BLEEDING  TENDERNESS IN MOUTH AND THROAT WITH OR WITHOUT PRESENCE OF ULCERS  *URINARY PROBLEMS  *BOWEL PROBLEMS  UNUSUAL RASH Items with * indicate a potential emergency and should be followed up as soon as possible.  Feel free to call the clinic should you have any questions or concerns. The clinic phone number is [phone].  Please show the [place] at check-in to the Emergency Department and triage nurse.   Zoledronic Acid injection (Hypercalcemia, Oncology) What is this medicine? ZOLEDRONIC ACID (ZOE le dron ik AS id) lowers the amount of calcium loss from bone. It is used to treat too much calcium in your blood from cancer. It is also used to prevent complications of cancer that has spread to the bone. This medicine may be used for other purposes; ask your health care provider or pharmacist if you have questions. COMMON BRAND NAME(S): Zometa What should I tell my health care provider before I take this medicine? They need to know if you have any of these conditions:  aspirin-sensitive asthma  cancer, especially if you are receiving medicines used to treat cancer  dental disease or wear dentures  infection  kidney disease  receiving corticosteroids like dexamethasone or prednisone  an unusual or allergic reaction to zoledronic  acid, other medicines, foods, dyes, or preservatives  pregnant or trying to get pregnant  breast-feeding How should I use this medicine? This medicine is for infusion into a vein. It is given by a health care professional in a hospital or clinic setting. Talk to your pediatrician regarding the use of this medicine in children. Special care may be needed. Overdosage: If you think you have taken too much of this medicine contact a poison control center or emergency room at once. NOTE: This medicine is only for you. Do not share this medicine with others. What if I miss a dose? It is important not to miss your dose. Call your doctor or health care professional if you are unable to keep an appointment. What may interact with this medicine?  certain antibiotics given by injection  NSAIDs, medicines for pain and inflammation, like ibuprofen or naproxen  some diuretics like bumetanide, furosemide  teriparatide  thalidomide This list may not describe all possible interactions. Give your health care provider a list of all the medicines, herbs, non-prescription drugs, or dietary supplements you use. Also tell them if you smoke, drink alcohol, or use illegal drugs. Some items may interact with your medicine. What should I watch for while using this medicine? Visit your doctor or health care professional for regular checkups. It may be some time before you see the benefit from this medicine. Do not stop taking your medicine unless your doctor tells you to. Your doctor may order blood tests or other tests to see how you are doing. Women should inform their doctor if they wish to become pregnant or think  they might be pregnant. There is a potential for serious side effects to an unborn child. Talk to your health care professional or pharmacist for more information. You should make sure that you get enough calcium and vitamin D while you are taking this medicine. Discuss the foods you eat and the  vitamins you take with your health care professional. Some people who take this medicine have severe bone, joint, and/or muscle pain. This medicine may also increase your risk for jaw problems or a broken thigh bone. Tell your doctor right away if you have severe pain in your jaw, bones, joints, or muscles. Tell your doctor if you have any pain that does not go away or that gets worse. Tell your dentist and dental surgeon that you are taking this medicine. You should not have major dental surgery while on this medicine. See your dentist to have a dental exam and fix any dental problems before starting this medicine. Take good care of your teeth while on this medicine. Make sure you see your dentist for regular follow-up appointments. What side effects may I notice from receiving this medicine? Side effects that you should report to your doctor or health care professional as soon as possible:  allergic reactions like skin rash, itching or hives, swelling of the face, lips, or tongue  anxiety, confusion, or depression  breathing problems  changes in vision  eye pain  feeling faint or lightheaded, falls  jaw pain, especially after dental work  mouth sores  muscle cramps, stiffness, or weakness  redness, blistering, peeling or loosening of the skin, including inside the mouth  trouble passing urine or change in the amount of urine Side effects that usually do not require medical attention (report to your doctor or health care professional if they continue or are bothersome):  bone, joint, or muscle pain  constipation  diarrhea  fever  hair loss  irritation at site where injected  loss of appetite  nausea, vomiting  stomach upset  trouble sleeping  trouble swallowing  weak or tired This list may not describe all possible side effects. Call your doctor for medical advice about side effects. You may report side effects to FDA at 1-800-FDA-1088. Where should I keep my  medicine? This drug is given in a hospital or clinic and will not be stored at home. NOTE: This sheet is a summary. It may not cover all possible information. If you have questions about this medicine, talk to your doctor, pharmacist, or health care provider.  2020 Elsevier/Gold Standard (2013-11-25 14:19:39)   Coronavirus (COVID-19) Are you at risk?  Are you at risk for the Coronavirus (COVID-19)?  To be considered HIGH RISK for Coronavirus (COVID-19), you have to meet the following criteria:  . Traveled to [place], [place], [place], [place] or [place]; or in the [place] to [place], [place], [place], or [place]; and have fever, cough, and shortness of breath within the last 2 weeks of travel OR . Been in close contact with a person diagnosed with COVID-19 within the last 2 weeks and have fever, cough, and shortness of breath . IF YOU DO NOT MEET THESE CRITERIA, YOU ARE CONSIDERED LOW RISK FOR COVID-19.  What to do if you are HIGH RISK for COVID-19?  Marland Kitchen If you are having a medical emergency, call 911. . Seek medical care right away. Before you go to a doctor's office, urgent care or emergency department, call ahead and tell them about your recent travel, contact with  someone diagnosed with COVID-19, and your symptoms. You should receive instructions from your physician's office regarding next steps of care.  . When you arrive at healthcare provider, tell the healthcare staff immediately you have returned from visiting [place], [place], [place], [place] or [place]; or traveled in the [place] to [place], [place], [place], or [place]; in the last two weeks or you have been in close contact with a person diagnosed with COVID-19 in the last 2 weeks.   . Tell the health care staff about your symptoms: fever, cough and shortness of breath. . After you have been seen by a medical provider, you will be either: o Tested for (COVID-19) and discharged home on quarantine except to  seek medical care if symptoms worsen, and asked to  - Stay home and avoid contact with others until you get your results (4-5 days)  - Avoid travel on public transportation if possible (such as bus, train, or airplane) or o Sent to the Emergency Department by EMS for evaluation, COVID-19 testing, and possible admission depending on your condition and test results.  What to do if you are LOW RISK for COVID-19?  Reduce your risk of any infection by using the same precautions used for avoiding the common cold or flu:  Marland Kitchen Wash your hands often with soap and warm water for at least 20 seconds.  If soap and water are not readily available, use an alcohol-based hand sanitizer with at least 60% alcohol.  . If coughing or sneezing, cover your mouth and nose by coughing or sneezing into the elbow areas of your shirt or coat, into a tissue or into your sleeve (not your hands). . Avoid shaking hands with others and consider head nods or verbal greetings only. . Avoid touching your eyes, nose, or mouth with unwashed hands.  . Avoid close contact with people who are sick. . Avoid places or events with large numbers of people in one location, like concerts or sporting events. . Carefully consider travel plans you have or are making. . If you are planning any travel outside or inside the [place], visit the CDC's Travelers' Health webpage for the latest health notices. . If you have some symptoms but not all symptoms, continue to monitor at home and seek medical attention if your symptoms worsen. . If you are having a medical emergency, call 911.   [place] / e-Visit: [email or web address]         [facility]: [facility]: [id number]                   [facility]: [phone]

## 2019-11-28 ENCOUNTER — Telehealth: Payer: Self-pay | Admitting: Hematology

## 2019-11-28 NOTE — Telephone Encounter (Signed)
Scheduled appt per 5/17 los.  Pt will get an updated appt calendar at her next scheduled appt.

## 2019-12-08 ENCOUNTER — Ambulatory Visit: Payer: BC Managed Care – PPO | Attending: Nurse Practitioner | Admitting: Physical Therapy

## 2019-12-08 ENCOUNTER — Other Ambulatory Visit: Payer: Self-pay

## 2019-12-08 ENCOUNTER — Encounter: Payer: Self-pay | Admitting: Physical Therapy

## 2019-12-08 DIAGNOSIS — R209 Unspecified disturbances of skin sensation: Secondary | ICD-10-CM | POA: Insufficient documentation

## 2019-12-08 DIAGNOSIS — R208 Other disturbances of skin sensation: Secondary | ICD-10-CM

## 2019-12-08 DIAGNOSIS — M6281 Muscle weakness (generalized): Secondary | ICD-10-CM | POA: Diagnosis not present

## 2019-12-08 DIAGNOSIS — R293 Abnormal posture: Secondary | ICD-10-CM

## 2019-12-08 NOTE — Therapy (Signed)
[facility] [place], [place], [address] Phone: [phone]   Fax:  [phone]  Physical Therapy Evaluation  Patient Details  Name: Chelsea Hobbs MRN: [id number] Date of Birth: 1971/05/26 Referring Provider (PT): [name]   Encounter Date: 12/08/2019  PT End of Session - 12/08/19 0848    Visit Number  1    Number of Visits  5    Date for PT Re-Evaluation  01/12/20    PT Start Time  0804    PT Stop Time  0846    PT Time Calculation (min)  42 min    Activity Tolerance  Patient tolerated treatment well    Behavior During Therapy  [facility] for tasks assessed/performed       Past Medical History:  Diagnosis Date  . Asthma   . Family history of breast cancer   . Family history of lung cancer     Past Surgical History:  Procedure Laterality Date  . BREAST BIOPSY Right   . BREAST LUMPECTOMY WITH RADIOACTIVE SEED AND SENTINEL LYMPH NODE BIOPSY Left 04/04/2019   Procedure: RADIOCATIVE SEED GUIDED LEFT BREAST LUMPECTOMY, LEFT AXILLARY SENTINEL LYMPH NODE BIOPSY;  Surgeon: Erroll Luna, MD;  Location: [facility];  Service: General;  Laterality: Left;  . NASAL SINUS SURGERY    . PORTACATH PLACEMENT N/A 04/04/2019   Procedure: INSERTION PORT-A-CATH WITH ULTRASOUND;  Surgeon: Erroll Luna, MD;  Location: [facility];  Service: General;  Laterality: N/A;    There were no vitals filed for this visit.   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 My dexterity is not quite there. I also feel like my balance is a little off.    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 Patient reported she underwent a left lumpectomy and sentinel node biopsy on 04/04/2019 with 1 negative axillary node removed with a Ki67 of  60%. Pt completed radiation and chemo    Patient Stated Goals  to get my fingers stronger for my work, to improve balance    Currently in Pain?  No/denies    Pain Score  0-No pain         [facility] PT Assessment - 12/08/19 0001      Assessment   Medical Diagnosis  left breast cancer    Referring Provider (PT)  [name]    Onset Date/Surgical Date  04/04/19    Hand Dominance  Right    Prior Therapy  none      Precautions   Precautions  Other (comment)    Precaution Comments  at risk for lymphedema      Restrictions   Weight Bearing Restrictions  No      Balance Screen   Has the patient fallen in the past 6 months  No    Has the patient had a decrease in activity level because of a fear of falling?   No    Is the patient reluctant to leave their home because of a fear of falling?   No      Home Environment   Living Environment  Private residence    Living Arrangements  Spouse/significant other;Children   Husband, 30 and 89 y.o. kids   Available Help at Discharge  Family    Type of [place]  Prior Function   Level of Independence  Independent    Vocation  Full time employment    Physiological scientist    Leisure  She is walking 30 min/day 5 days/week      Cognition   Overall Cognitive Status  Within Functional Limits for tasks assessed      Posture/Postural Control   Posture/Postural Control  Postural limitations    Postural Limitations  Rounded Shoulders;Forward head      AROM   Overall AROM   Within functional limits for tasks performed      Strength   Strength Assessment Site  Hip;Knee;Ankle;Hand    Right/Left hand  Right;Left    Right Hand Grip (lbs)  41    Left Hand Grip (lbs)  47    Right/Left Hip  Right;Left    Right Hip Flexion  3+/5    Right Hip Extension  5/5    Right Hip ABduction  4+/5    Left Hip Flexion  4-/5    Left Hip Extension  5/5    Left Hip ADduction  4+/5    Right Knee Flexion  5/5    Right Knee Extension  4/5     Left Knee Flexion  5/5    Left Knee Extension  4-/5    Right Ankle Dorsiflexion  5/5    Left Ankle Dorsiflexion  5/5      Standardized Balance Assessment   Standardized Balance Assessment  --   30 sec sit to stand- 17 reps, slightly below average     High Level Balance   High Level Balance Comments  SLS: L side: 22 sec, R side: 30 sec        LYMPHEDEMA/ONCOLOGY QUESTIONNAIRE - 12/08/19 0829      Type   Cancer Type  Left breast cancer      Surgeries   Lumpectomy Date  04/04/19    Sentinel Lymph Node Biopsy Date  04/04/19    Number Lymph Nodes Removed  1      Treatment   Active Chemotherapy Treatment  Yes    Date  05/12/19    Past Chemotherapy Treatment  No    Active Radiation Treatment  No    Past Radiation Treatment  No    Current Hormone Treatment  No    Past Hormone Therapy  No      What other symptoms do you have   Are you Having Heaviness or Tightness  No    Are you having Pain  No    Are you having pitting edema  No    Is it Hard or Difficult finding clothes that fit  No    Do you have infections  Yes    Is there Decreased scar mobility  No    Stemmer Sign  No      Lymphedema Assessments   Lymphedema Assessments  Upper extremities      Right Upper Extremity Lymphedema   10 cm Proximal to Olecranon Process  26 cm    Olecranon Process  22.5 cm    10 cm Proximal to Ulnar Styloid Process  18.6 cm    Just Proximal to Ulnar Styloid Process  14.2 cm    Across Hand at [employer]  18.1 cm    At [place] of 2nd Digit  5.7 cm      Left Upper Extremity Lymphedema   10 cm Proximal to Olecranon Process  25 cm    Olecranon Process  22.4 cm    10 cm Proximal to Ulnar Styloid Process  18.5 cm    Just Proximal to Ulnar Styloid Process  13.7 cm    Across Hand at [employer]  18 cm    At [place] of 2nd Digit  5.6 cm               Objective measurements completed on examination: See above findings.      [place] [facility] Adult PT Treatment/Exercise - 12/08/19  0001      Exercises   Exercises  Hand;Knee/Hip      Knee/Hip Exercises: Standing   Other Standing Knee Exercises  mini squats x 10 with verbal cues to keep feet planted and toes flat, not to allow knees to go over toes      Hand Exercises   Other Hand Exercises  educated pt on use of theraputty and issued yellow putty to pt to use at home, educated pt on how to use hand and finger strengthening tools at home             PT Education - 12/08/19 0902    Education Details  use hand strengthening tools pt purchased off [address], use of theraputty, mini squats    Person(s) Educated  Patient    Methods  Explanation    Comprehension  Verbalized understanding          PT Long Term Goals - 12/08/19 0858      PT LONG TERM GOAL #1   Title  Pt will demonstrate 65lbs of grip strength bilaterally which is normal for her age to allow her to complete her job tasks.    Baseline  R 41 lbs, L 47 lbs    Time  5    Period  Weeks    Status  New    Target Date  01/12/20      PT LONG TERM GOAL #2   Title  Pt will be able to stand on L leg for greater than 30 seconds to allow improved balance and decrease fall risk    Baseline  22 sec    Time  5    Period  Weeks    Status  New    Target Date  01/12/20      PT LONG TERM GOAL #3   Title  Pt will be independent in a home exercise program for continued strengthening of hands, fingers and quads.    Time  5    Period  Weeks    Status  New    Target Date  01/12/20      PT LONG TERM GOAL #4   Title  Pt will report improved dexterity when working with dental hygiene tools at work to allow her to return to PLOF.    Baseline  trouble with fine motor skills    Time  5    Period  Weeks    Status  New    Target Date  01/12/20             Plan - 12/08/19 0849    Clinical Impression Statement  Pt presents to PT with R hand and finger weakness and decreased balance following chemo, radiation and L lumpectomy and SLNB for treatment of left  breast cancer. She underwent surgery on 04/04/19. 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 She also reports feeling off balance and  unsteady at times. She walks several times a week but reports this is all of the exercise she has done recently. Her grip strength is about 15lbs below average bilaterally with right weaker than left though she is right handed. Her quad strength is also impaired. She would benefit from skilled PT services to improve R hand strength and dexterity, improve fine motor skills in R hand and improve overall balance.    Examination-Activity Limitations  Hygiene/Grooming   difficulty brushing teeth on left side   Examination-Participation Restrictions  Other   difficulty with fine motor skills required by her job   Stability/Clinical Decision Making  Stable/Uncomplicated    Clinical Decision Making  Low    Rehab Potential  Excellent    PT Frequency  1x / week    PT Duration  Other (comment)   5 weeks   PT Treatment/Interventions  ADLs/Self Care Home Management;Therapeutic exercise;Patient/family education;Manual techniques;Taping;Electrical Stimulation;Neuromuscular re-education;Balance training    PT Next Visit Plan  work on quad strengthing, high level balance (bosu, standing on foam etc), hand strengthening especially finger strengtheing and fine motor skills    PT Home Exercise Plan  use theraputty and hand strengtheners, focus on individual finger strength, mini squats, exercise machine at home for quad strength    Consulted and Agree with Plan of Care  Patient       Patient will benefit from skilled therapeutic intervention in order to improve the following deficits and impairments:  Postural dysfunction, Pain, Decreased balance, Impaired sensation  Visit Diagnosis: Muscle weakness (generalized)  Other disturbances of skin sensation  Abnormal posture     Problem  List Patient Active Problem List   Diagnosis Date Noted  . Port-A-Cath in place 06/02/2019  . Intraductal papilloma of breast, right 04/21/2019  . Genetic testing 03/31/2019  . Family history of breast cancer   . Family history of lung cancer   . Malignant neoplasm of upper-inner quadrant of left breast in female, estrogen receptor positive ([facility]) 03/21/2019  . NASAL POLYP 09/22/2007  . ALLERGIC RHINITIS 09/22/2007  . ASTHMA 09/22/2007  . PERSONAL HISTORY OF ALLERGY TO LATEX 09/22/2007    Allyson Sabal [facility] 12/08/2019, [place], [place], [address] Phone: [phone]   Fax:  [phone]  Name: Damesha Lawler Molino MRN: [id number] Date of Birth: [birth year]  Manus Gunning, PT 12/08/19 9:05 AM

## 2019-12-14 ENCOUNTER — Encounter: Payer: Self-pay | Admitting: *Deleted

## 2019-12-17 ENCOUNTER — Other Ambulatory Visit: Payer: Self-pay | Admitting: Hematology

## 2019-12-22 ENCOUNTER — Inpatient Hospital Stay: Payer: BC Managed Care – PPO | Attending: Hematology

## 2019-12-22 ENCOUNTER — Other Ambulatory Visit: Payer: Self-pay | Admitting: Hematology

## 2019-12-22 ENCOUNTER — Other Ambulatory Visit: Payer: Self-pay

## 2019-12-22 VITALS — BP 116/73 | HR 79 | Temp 98.6°F | Resp 16 | Wt 125.0 lb

## 2019-12-22 DIAGNOSIS — Z5112 Encounter for antineoplastic immunotherapy: Secondary | ICD-10-CM | POA: Insufficient documentation

## 2019-12-22 DIAGNOSIS — Z923 Personal history of irradiation: Secondary | ICD-10-CM | POA: Insufficient documentation

## 2019-12-22 DIAGNOSIS — Z79899 Other long term (current) drug therapy: Secondary | ICD-10-CM | POA: Diagnosis not present

## 2019-12-22 DIAGNOSIS — Z170 Estrogen receptor positive status [ER+]: Secondary | ICD-10-CM | POA: Diagnosis not present

## 2019-12-22 DIAGNOSIS — G629 Polyneuropathy, unspecified: Secondary | ICD-10-CM | POA: Insufficient documentation

## 2019-12-22 DIAGNOSIS — Z9221 Personal history of antineoplastic chemotherapy: Secondary | ICD-10-CM | POA: Insufficient documentation

## 2019-12-22 DIAGNOSIS — C50212 Malignant neoplasm of upper-inner quadrant of left female breast: Secondary | ICD-10-CM | POA: Diagnosis not present

## 2019-12-22 DIAGNOSIS — Z79810 Long term (current) use of selective estrogen receptor modulators (SERMs): Secondary | ICD-10-CM | POA: Diagnosis not present

## 2019-12-22 MED ORDER — ACETAMINOPHEN 325 MG PO TABS
650.0000 mg | ORAL_TABLET | Freq: Once | ORAL | Status: AC
  Administered 2019-12-22: 650 mg via ORAL

## 2019-12-22 MED ORDER — DIPHENHYDRAMINE HCL 25 MG PO CAPS
ORAL_CAPSULE | ORAL | Status: AC
  Filled 2019-12-22: qty 2

## 2019-12-22 MED ORDER — TRASTUZUMAB-DKST CHEMO 150 MG IV SOLR
6.0000 mg/kg | Freq: Once | INTRAVENOUS | Status: AC
  Administered 2019-12-22: 336 mg via INTRAVENOUS
  Filled 2019-12-22: qty 16

## 2019-12-22 MED ORDER — HEPARIN SOD (PORK) LOCK FLUSH 100 UNIT/ML IV SOLN
500.0000 [IU] | Freq: Once | INTRAVENOUS | Status: AC | PRN
  Administered 2019-12-22: 500 [IU]
  Filled 2019-12-22: qty 5

## 2019-12-22 MED ORDER — SODIUM CHLORIDE 0.9 % IV SOLN
Freq: Once | INTRAVENOUS | Status: AC
  Filled 2019-12-22: qty 250

## 2019-12-22 MED ORDER — DIPHENHYDRAMINE HCL 25 MG PO CAPS
50.0000 mg | ORAL_CAPSULE | Freq: Once | ORAL | Status: AC
  Administered 2019-12-22: 50 mg via ORAL

## 2019-12-22 MED ORDER — SODIUM CHLORIDE 0.9% FLUSH
10.0000 mL | INTRAVENOUS | Status: DC | PRN
  Administered 2019-12-22: 10 mL
  Filled 2019-12-22: qty 10

## 2019-12-22 MED ORDER — ACETAMINOPHEN 325 MG PO TABS
ORAL_TABLET | ORAL | Status: AC
  Filled 2019-12-22: qty 2

## 2019-12-22 NOTE — Patient Instructions (Signed)
[facility] °Discharge Instructions for Patients Receiving Chemotherapy ° °Today you received the following chemotherapy agents Trastuzumab ° °To help prevent nausea and vomiting after your treatment, we encourage you to take your nausea medication as directed. °  °If you develop nausea and vomiting that is not controlled by your nausea medication, call the clinic.  ° °BELOW ARE SYMPTOMS THAT SHOULD BE REPORTED IMMEDIATELY: °· *FEVER GREATER THAN 100.5 F °· *CHILLS WITH OR WITHOUT FEVER °· NAUSEA AND VOMITING THAT IS NOT CONTROLLED WITH YOUR NAUSEA MEDICATION °· *UNUSUAL SHORTNESS OF BREATH °· *UNUSUAL BRUISING OR BLEEDING °· TENDERNESS IN MOUTH AND THROAT WITH OR WITHOUT PRESENCE OF ULCERS °· *URINARY PROBLEMS °· *BOWEL PROBLEMS °· UNUSUAL RASH °Items with * indicate a potential emergency and should be followed up as soon as possible. ° °Feel free to call the clinic should you have any questions or concerns. The clinic phone number is [phone]. ° °Please show the CHEMO ALERT CARD at check-in to the Emergency Department and triage nurse. ° ° °

## 2019-12-25 ENCOUNTER — Ambulatory Visit: Payer: BC Managed Care – PPO | Attending: Nurse Practitioner

## 2019-12-25 ENCOUNTER — Other Ambulatory Visit: Payer: Self-pay

## 2019-12-25 DIAGNOSIS — R209 Unspecified disturbances of skin sensation: Secondary | ICD-10-CM | POA: Insufficient documentation

## 2019-12-25 DIAGNOSIS — M6281 Muscle weakness (generalized): Secondary | ICD-10-CM | POA: Insufficient documentation

## 2019-12-25 DIAGNOSIS — R208 Other disturbances of skin sensation: Secondary | ICD-10-CM

## 2019-12-25 DIAGNOSIS — R293 Abnormal posture: Secondary | ICD-10-CM

## 2019-12-25 NOTE — Patient Instructions (Signed)
Over Head Pull: Narrow and Wide Grip   Cancer Rehab [phone]   On back, knees bent, feet flat, band across thighs, elbows straight but relaxed. Pull hands apart (start). Keeping elbows straight, bring arms up and over head, hands toward floor. Keep pull steady on band. Hold momentarily. Return slowly, keeping pull steady, back to start. Then do same with a wider grip on the band (past shoulder width) Repeat _5-10__ times. Band color __red____   Side Pull: Double Arm   On back, knees bent, feet flat. Arms perpendicular to body, shoulder level, elbows straight but relaxed. Pull arms out to sides, elbows straight. Resistance band comes across collarbones, hands toward floor. Hold momentarily. Slowly return to starting position. Repeat _5-10__ times. Band color _red____   Sword   On back, knees bent, feet flat, left hand on left hip, right hand above left. Pull right arm DIAGONALLY (hip to shoulder) across chest. Bring right arm along head toward floor. Hold momentarily. Slowly return to starting position. Repeat _5-10__ times. Do with left arm. Band color _red_____   Shoulder Rotation: Double Arm   On back, knees bent, feet flat, elbows tucked at sides, bent 90, hands palms up. Pull hands apart and down toward floor, keeping elbows near sides. Hold momentarily. Slowly return to starting position. Repeat _5-10__ times. Band color __red____   Heel Raise: Bilateral (Standing)   Cancer Rehab [phone]    Stand near counter for fingertip support if needed. Rise on balls of feet doing one leg at a time.  Repeat __10-20__ times per set. Do _1-2___ sets per session. Do __2__ sessions per day.  SINGLE LIMB STANCE    Stand at counter (corner if you have one) for minimal arm support. Raise leg. Hold _10-20__ seconds. Repeat with other leg. Once this becomes easier, for increased challenge, close eyes with fingertips on counter for safety. _3-5__ reps per set, _2-3__ sets per day.  Tandem  Stance    Stand at counter (corner if you have one). Right foot in front of left, heel touching toe both feet "straight ahead". Stand on Foot Triangle of Support with both feet. Balance in this position _10-20__ seconds. Do with left foot in front of right. Once this becomes easier, for increased challenge, close eyes with fingertips on counter for safety.  Step-Up: Forward    Move step-stool to counter or hold rail if at steps, but as little as able. Step up forward keeping opposite foot off step. Keep pelvis level and back straight. Hold the single leg stand for 3 seconds, then come back down slowly.  Do _10__ times, do _1-2_ sets, on each leg, _1-2__ times per day.  Hamstring Stretch    Inhale and straighten spine. Exhale and lean forward toward extended leg. Hold position for _20__ seconds. Inhale and come back to center. Repeat with other leg extended. Repeat _2-3__ times, alternating legs. Do _3__ times per day.  Piriformis Stretch, Sitting    Sit, one ankle on opposite knee, same-side hand on crossed knee. Push down on knee, keeping spine straight. Lean torso forward, with flat back, until tension is felt in hamstrings and gluteals of crossed-leg side. Hold __20_ seconds.  Repeat _2-3__ times per session. Do _3__ sessions per day.  Quadriceps Stretch    Fingertips on counter. Reach behind with left arm and hold left leg, ankle, or foot. Hold position for _20__ seconds. Repeat on other side. Repeat _2-3__ times, alternating sides. Do __3_ times per day.

## 2019-12-25 NOTE — Therapy (Signed)
[facility] [place], [place], [address] Phone: [phone]   Fax:  [phone]  Physical Therapy Treatment  Patient Details  Name: Chelsea Hobbs MRN: [id number] Date of Birth: May 16, 1971 Referring Provider (PT): [name]   Encounter Date: 12/25/2019   PT End of Session - 12/25/19 0921    Visit Number 2    Number of Visits 5    Date for PT Re-Evaluation 01/12/20    PT Start Time 0809   pt arrived late   PT Stop Time 0907    PT Time Calculation (min) 58 min    Activity Tolerance Patient tolerated treatment well    Behavior During Therapy [facility] for tasks assessed/performed           Past Medical History:  Diagnosis Date  . Asthma   . Family history of breast cancer   . Family history of lung cancer     Past Surgical History:  Procedure Laterality Date  . BREAST BIOPSY Right   . BREAST LUMPECTOMY WITH RADIOACTIVE SEED AND SENTINEL LYMPH NODE BIOPSY Left 04/04/2019   Procedure: RADIOCATIVE SEED GUIDED LEFT BREAST LUMPECTOMY, LEFT AXILLARY SENTINEL LYMPH NODE BIOPSY;  Surgeon: Erroll Luna, MD;  Location: [place];  Service: General;  Laterality: Left;  . NASAL SINUS SURGERY    . PORTACATH PLACEMENT N/A 04/04/2019   Procedure: INSERTION PORT-A-CATH WITH ULTRASOUND;  Surgeon: Erroll Luna, MD;  Location: [place];  Service: General;  Laterality: N/A;    There were no vitals filed for this visit.   Subjective Assessment - 12/25/19 0811    Subjective I just want to get my legs and hands stronger and improve my balance. I can tell my finger strength is a little better already from the new HEP. And I brought my hand exercise bag I got off [employer] to show you.    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 Patient reported she underwent a left lumpectomy and sentinel node biopsy on 04/04/2019  with 1 negative axillary node removed with a Ki67 of 60%. Pt completed radiation and chemo    Patient Stated Goals to get my fingers stronger for my work, to improve balance    Currently in Pain? No/denies                             [facility] Adult PT Treatment/Exercise - 12/25/19 0001      Neuro Re-ed    Neuro Re-ed Details  In // bars: Front and retro tandem walking; front and retro toe walking with eyes open for 1st set, then eyes closed with fingertip on bar all 2x each; then slow, controlled high knee marching front and retro 2x each; [place] Rt and Lt 1x each min challenge      Knee/Hip Exercises: Stretches   Passive Hamstring Stretch Right;Left;2 reps;20 seconds    Piriformis Stretch Right;Left;2 reps;20 seconds      Knee/Hip Exercises: Aerobic   Stationary Bike Level 1, x6 mins with PTA present to monitor pts tolerance of new activity      Knee/Hip Exercises: Standing   Forward Lunges Right;Left;10 reps    Forward Lunges Limitations in // bars with demo multiple times for correct technique to prevent anterior knee from going over toes. Pt weaker with Lt LE posterior    Forward Step Up Right;Left;10 reps;Hand Hold: 1;Step Height: 6"  airex oin step   Forward Step Up Limitations VCs to hold SLS 3 sec on step with each rep    Other Standing Knee Exercises Squats for "butt tap" on chair with cuing for correct technique and shoulder flexion to ~90 degrees with each rep to assist with correct technique x10                  PT Education - 12/25/19 0913    Education Details Continue hand strengthening exs she got from [employer] (finger web, [employer], grip strengthener, ball and wheel to squeeze); added supine scapular series with red theraband, balance activities, and bil LE flexibility    Person(s) Educated Patient    Methods Explanation;Demonstration;Handout    Comprehension Returned demonstration;Verbalized understanding;Tactile cues required;Need further  instruction               PT Long Term Goals - 12/08/19 0858      PT LONG TERM GOAL #1   Title Pt will demonstrate 65lbs of grip strength bilaterally which is normal for her age to allow her to complete her job tasks.    Baseline R 41 lbs, L 47 lbs    Time 5    Period Weeks    Status New    Target Date 01/12/20      PT LONG TERM GOAL #2   Title Pt will be able to stand on L leg for greater than 30 seconds to allow improved balance and decrease fall risk    Baseline 22 sec    Time 5    Period Weeks    Status New    Target Date 01/12/20      PT LONG TERM GOAL #3   Title Pt will be independent in a home exercise program for continued strengthening of hands, fingers and quads.    Time 5    Period Weeks    Status New    Target Date 01/12/20      PT LONG TERM GOAL #4   Title Pt will report improved dexterity when working with dental hygiene tools at work to allow her to return to PLOF.    Baseline trouble with fine motor skills    Time 5    Period Weeks    Status New    Target Date 01/12/20                 Plan - 12/25/19 2706    Clinical Impression Statement Began balance activites today along with bil hip flexibility and postural strength. Pt tolerated all very well and was able to correct technique when cued verbally or with therapist demo. Progressed HEP (see education section and flowsheet). Pt reports challenged by more advanced balance activities today and liked learning postural strength as well as this will help with her job. Encouraged pt to cont with her hand exs she ordered from [place] as these were a good variety to work on varying things like grip strength, finger strength and muscle endurance. Pt verbalized understanding.    Examination-Activity Limitations Hygiene/Grooming   difficulty brushing teeth on left side   Examination-Participation Restrictions Other   difficulty with fine motor skills required for her job   Stability/Clinical Decision Making  Stable/Uncomplicated    Rehab Potential Excellent    PT Frequency 1x / week    PT Duration Other (comment)   5 weeks   PT Treatment/Interventions ADLs/Self Care Home Management;Therapeutic exercise;Patient/family education;Manual techniques;Taping;Electrical Stimulation;Neuromuscular re-education;Balance training    PT Next Visit Plan  Cont to work on quad strengthing, high level balance (bosu, standing on foam etc), reassess hand grip strength and see how her HEP with these is going including finger strengtheing and fine motor skills; review new HEP issued today prn    PT Home Exercise Plan use theraputty and hand strengtheners, focus on individual finger strength, mini squats, exercise machine at home for quad strength; balance and postural strength, and hip flexibility HEP    Consulted and Agree with Plan of Care Patient           Patient will benefit from skilled therapeutic intervention in order to improve the following deficits and impairments:  Postural dysfunction, Pain, Decreased balance, Impaired sensation  Visit Diagnosis: Muscle weakness (generalized)  Other disturbances of skin sensation  Abnormal posture     Problem List Patient Active Problem List   Diagnosis Date Noted  . Port-A-Cath in place 06/02/2019  . Intraductal papilloma of breast, right 04/21/2019  . Genetic testing 03/31/2019  . Family history of breast cancer   . Family history of lung cancer   . Malignant neoplasm of upper-inner quadrant of left breast in female, estrogen receptor positive ([facility]) 03/21/2019  . NASAL POLYP 09/22/2007  . ALLERGIC RHINITIS 09/22/2007  . ASTHMA 09/22/2007  . PERSONAL HISTORY OF ALLERGY TO LATEX 09/22/2007    Otelia Limes, PTA 12/25/2019, 9:37 AM  [facility] [place], [place], [address] Phone: [phone]   Fax:  [phone]  Name: Chelsea Hobbs MRN: [id number] Date of Birth:  25-Mar-1971

## 2019-12-26 ENCOUNTER — Telehealth: Payer: Self-pay | Admitting: Hematology

## 2019-12-26 NOTE — Telephone Encounter (Signed)
Called pt per 6/15 sch message - unable to reach pt- left message for patient letting them know we are currently unable to reschedule to later time due to MD availability.

## 2019-12-28 NOTE — Progress Notes (Signed)
Error. See note from office visit.

## 2020-01-01 ENCOUNTER — Ambulatory Visit: Payer: BC Managed Care – PPO

## 2020-01-01 ENCOUNTER — Other Ambulatory Visit: Payer: Self-pay

## 2020-01-01 DIAGNOSIS — M6281 Muscle weakness (generalized): Secondary | ICD-10-CM

## 2020-01-01 DIAGNOSIS — R208 Other disturbances of skin sensation: Secondary | ICD-10-CM

## 2020-01-01 DIAGNOSIS — R293 Abnormal posture: Secondary | ICD-10-CM | POA: Diagnosis not present

## 2020-01-01 DIAGNOSIS — R209 Unspecified disturbances of skin sensation: Secondary | ICD-10-CM | POA: Diagnosis not present

## 2020-01-01 NOTE — Therapy (Signed)
[facility] [place], [place], [address] Phone: [phone]   Fax:  [phone]  Physical Therapy Treatment  Patient Details  Name: Chelsea Hobbs MRN: [id number] Date of Birth: 08-Mar-1971 Referring Provider (PT): [name]   Encounter Date: 01/01/2020   PT End of Session - 01/01/20 1805    Visit Number 3    Number of Visits 5    Date for PT Re-Evaluation 01/12/20    PT Start Time 6222    PT Stop Time 1701    PT Time Calculation (min) 54 min    Activity Tolerance Patient tolerated treatment well    Behavior During Therapy [facility] for tasks assessed/performed           Past Medical History:  Diagnosis Date  . Asthma   . Family history of breast cancer   . Family history of lung cancer     Past Surgical History:  Procedure Laterality Date  . BREAST BIOPSY Right   . BREAST LUMPECTOMY WITH RADIOACTIVE SEED AND SENTINEL LYMPH NODE BIOPSY Left 04/04/2019   Procedure: RADIOCATIVE SEED GUIDED LEFT BREAST LUMPECTOMY, LEFT AXILLARY SENTINEL LYMPH NODE BIOPSY;  Surgeon: Erroll Luna, MD;  Location: [place];  Service: General;  Laterality: Left;  . NASAL SINUS SURGERY    . PORTACATH PLACEMENT N/A 04/04/2019   Procedure: INSERTION PORT-A-CATH WITH ULTRASOUND;  Surgeon: Erroll Luna, MD;  Location: [place];  Service: General;  Laterality: N/A;    There were no vitals filed for this visit.   Subjective Assessment - 01/01/20 1608    [place]ive [place] and going [place] long stairs up to the house was fatiguing but was able to do it without rest. Was able to climb into the boat out of the water onto the ladder without assistance so I think I'm getting stronger. My fingers and grip strength is much improved with the exercises I've been doing. My tasks at work have gotten much easier.    Pertinent History Patient was diagnosed on 03/06/2019 with left  triple positive invasive ductal carcinoma breast cancer. Patient reported she underwent a left lumpectomy and sentinel node biopsy on 04/04/2019 with 1 negative axillary node removed with a Ki67 of 60%. Pt completed radiation and chemo    Patient Stated Goals to get my fingers stronger for my work, to improve balance    Currently in Pain? No/denies                             [facility] Adult PT Treatment/Exercise - 01/01/20 0001      Neuro Re-ed    Neuro Re-ed Details  In // bars: Front tandem walking with eyes closed with fingertips along bar 2x, then front step ups on 6" step with Airex and fingertips on bar with 3 sec hold on step x10 each; bil tandem stance on Airex with eyes closed x30 sec each; then on Airex for bil hip 3 way raises with 3# on each ankle and fingertip support througout, VCs to keep swinging leg elevated througout for SLS on WB LE.       Knee/Hip Exercises: Aerobic   Elliptical Inclince 7, Resistance 1 x3 mins    Nustep Resistance 4, 7 mins with therapist present monitoring pts tolerance      Knee/Hip Exercises: Machines for Strengthening   Cybex Knee Extension 25#, 2 x 10  Cybex Knee Flexion 15#, 2 x 10 (flex and ext done circuit style)    Cybex Leg Press 35#, 2x20 with cuing througout for eccentric control; then calf presses 35# 2x20 returning therapist demo                       PT Long Term Goals - 12/08/19 0858      PT LONG TERM GOAL #1   Title Pt will demonstrate 65lbs of grip strength bilaterally which is normal for her age to allow her to complete her job tasks.    Baseline R 41 lbs, L 47 lbs    Time 5    Period Weeks    Status New    Target Date 01/12/20      PT LONG TERM GOAL #2   Title Pt will be able to stand on L leg for greater than 30 seconds to allow improved balance and decrease fall risk    Baseline 22 sec    Time 5    Period Weeks    Status New    Target Date 01/12/20      PT LONG TERM GOAL #3   Title Pt  will be independent in a home exercise program for continued strengthening of hands, fingers and quads.    Time 5    Period Weeks    Status New    Target Date 01/12/20      PT LONG TERM GOAL #4   Title Pt will report improved dexterity when working with dental hygiene tools at work to allow her to return to PLOF.    Baseline trouble with fine motor skills    Time 5    Period Weeks    Status New    Target Date 01/12/20                 Plan - 01/01/20 1806    Clinical Impression Statement Continued with balance activities but also progressed bil LE strength to include weighted machines and cardio for endurance. Pt tolerated both very well reporting feeling challenged by session today and was also challenged by progression of high level balance activities having her perform some with eyes closed and adding ankle weights when on Airex. Overall pt tolerated session very well and is also reporting good improvement with finger and grip strength from her HEP.    Examination-Activity Limitations Hygiene/Grooming   difficulty brushing teeth on Lt side   Examination-Participation Restrictions Other   difficulty with fine motor skills required for her job   Stability/Clinical Decision Making Stable/Uncomplicated    Rehab Potential Excellent    PT Frequency 1x / week    PT Duration Other (comment)   5 weeks   PT Treatment/Interventions ADLs/Self Care Home Management;Therapeutic exercise;Patient/family education;Manual techniques;Taping;Electrical Stimulation;Neuromuscular re-education;Balance training    PT Next Visit Plan Cont to work on machines for cardio and strength, high level balance (bosu, standing on foam, working in // bars), reassess hand grip strength; review HEP prn and progress    PT Home Exercise Plan use theraputty and hand strengtheners, focus on individual finger strength, mini squats, exercise machine at home for quad strength; balance and postural strength, and hip flexibility  HEP    Consulted and Agree with Plan of Care Patient           Patient will benefit from skilled therapeutic intervention in order to improve the following deficits and impairments:  Postural dysfunction, Pain, Decreased balance, Impaired sensation  Visit  Diagnosis: Muscle weakness (generalized)  Other disturbances of skin sensation  Abnormal posture     Problem List Patient Active Problem List   Diagnosis Date Noted  . Port-A-Cath in place 06/02/2019  . Intraductal papilloma of breast, right 04/21/2019  . Genetic testing 03/31/2019  . Family history of breast cancer   . Family history of lung cancer   . Malignant neoplasm of upper-inner quadrant of left breast in female, estrogen receptor positive ([place]) 03/21/2019  . NASAL POLYP 09/22/2007  . ALLERGIC RHINITIS 09/22/2007  . ASTHMA 09/22/2007  . PERSONAL HISTORY OF ALLERGY TO LATEX 09/22/2007    Otelia Limes, PTA 01/01/2020, 6:38 PM  [facility] [place], [place], [address] Phone: [phone]   Fax:  [phone]  Name: Chelsea Hobbs MRN: [id number] Date of Birth: 14-Jan-1971

## 2020-01-09 NOTE — Progress Notes (Signed)
[place]   [phone] [phone]   Clinic Follow up Note   Patient Care Team: Wenda Low, MD as PCP - General (Internal Medicine) Rockwell Germany, RN as Oncology Nurse Navigator Mauro Kaufmann, RN as Oncology Nurse Navigator Erroll Luna, MD as Consulting Physician (General Surgery) Truitt Merle, MD as Consulting Physician (Hematology) Gery Pray, MD as Consulting Physician (Radiation Oncology) Alla Feeling, NP as Nurse Practitioner (Nurse Practitioner)  Date of Service:  01/12/2020  CHIEF COMPLAINT: F/u of left breast cancer  SUMMARY OF ONCOLOGIC HISTORY: Oncology History Overview Note  Cancer Staging Malignant neoplasm of upper-inner quadrant of left breast in female, estrogen receptor positive ([place]) Staging form: Breast, AJCC 8th Edition - Clinical stage from 03/22/2019: Stage IA (cT1c, cN0, cM0, G3, ER+, PR+, HER2+) - Signed by Truitt Merle, MD on 03/22/2019    Malignant neoplasm of upper-inner quadrant of left breast in female, estrogen receptor positive ([place])  03/10/2019 Mammogram   Diagnostic Mammogram 03/10/19  IMPRESSION: 1. Suspicious mass in the 10 o'clock position of the left breast, 3cm from the nipple measuring 12x7x48m. Biopsy is indicated. 2. Indeterminate mass in the 5:30 o'clock position of the left breast, 5cm from the nipple, measuring 9x6x772m Although this may reflect a fibroadenoma, tissue sampling is recommended.   03/16/2019 Initial Biopsy   Diagnosis 03/16/19  1. 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 SEE NOTE 2. Breast, left, needle core biopsy, 5:30 o'clock - FIBROADENOMA   03/16/2019 Receptors her2   Results: IMMUNOHISTOCHEMICAL AND MORPHOMETRIC ANALYSIS PERFORMED MANUALLY The tumor cells are POSITIVE for Her2 (3+). Estrogen Receptor: 100%, POSITIVE, STRONG STAINING INTENSITY Progesterone Receptor: 100%, POSITIVE, STRONG STAINING INTENSITY Proliferation Marker  Ki67: 60%   03/21/2019 Initial Diagnosis   Malignant neoplasm of upper-inner quadrant of left breast in female, estrogen receptor positive ([place]  03/22/2019 Cancer Staging   Staging form: Breast, AJCC 8th Edition - Clinical stage from 03/22/2019: Stage IA (cT1c, cN0, cM0, G3, ER+, PR+, HER2+) - Signed by FeTruitt MerleMD on 03/22/2019   03/31/2019 Genetic Testing   VUS in CTNNA1 called [id number]>T (Intronic) was identified on the [facility] Common Hereditary Cancers Panel. The Common Hereditary Cancers Panel offered by [facility] includes sequencing and/or deletion duplication testing of the following 47 genes: APC, ATM, AXIN2, BARD1, BMPR1A, BRCA1, BRCA2, BRIP1, CDH1, CDKN2A (p14ARF), CDKN2A (p16INK4a), CKD4, CHEK2, CTNNA1, DICER1, EPCAM (Deletion/duplication testing only), GREM1 (promoter region deletion/duplication testing only), KIT, MEN1, MLH1, MSH2, MSH3, MSH6, MUTYH, NBN, NF1, NHTL1, PALB2, PDGFRA, PMS2, POLD1, POLE, PTEN, RAD50, RAD51C, RAD51D, SDHB, SDHC, SDHD, SMAD4, SMARCA4. STK11, TP53, TSC1, TSC2, and VHL.  The following genes were evaluated for sequence changes only: SDHA and HOXB13 c.251G>A variant only. The report date is 03/30/2019.    03/31/2019 Breast MRI   MRI breast 03/31/19  IMPRESSION: 1. 5 millimeter enhancing oval mass in the anteromedial aspect of the RIGHT breast warranting further evaluation. Given its anterior depth and visibility on noncontrast images, ultrasound is recommended to determine if there is a sonographic correlate. If no sonographic correlate is identified to guide biopsy, MRI biopsy of the RIGHT breast is recommended. 2. Known malignancy in the UPPER INNER QUADRANT of the LEFT breast measuring 1.0 centimeters. 3. 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   04/04/2019 Surgery   RADIOCATIVE SEED GUIDED LEFT BREAST LUMPECTOMY, LEFT AXILLARY SENTINEL LYMPH NODE BIOPSY and PAC placement  By Dr. CoBrantley Stage9/22/20    04/04/2019  Pathology  Results   DIAGNOSIS: 04/04/19  A. BREAST, LEFT, LUMPECTOMY:  -  Invasive ductal carcinoma, Nottingham grade 3 of 3, 1.3 cm  -  Ductal carcinoma in-situ, intermediate grade  -  Margins uninvolved by carcinoma (0.1 cm; posterior margin; see  comment)  -  Previous biopsy site changes present  -  See oncology table and comment below   B. BREAST, LEFT, ADDITIONAL MEDIAL MARGIN, EXCISION:  -  Fibrocystic changes  -  No residual carcinoma identified   C. LYMPH NODE, LEFT AXILLARY, SENTINEL, BIOPSY:  -   No carcinoma identified in one lymph node (0/1)    04/04/2019 Cancer Staging   Staging form: Breast, AJCC 8th Edition - Pathologic stage from 04/04/2019: Stage IA (pT1c, pN0, cM0, G3, ER+, PR+, HER2+) - Signed by Truitt Merle, MD on 04/21/2019   04/11/2019 Pathology Results   Diagnosis Breast, right, needle core biopsy, 2 o'clock - SMALL INTRADUCTAL PAPILLOMA. SEE NOTE - SMALL FIBROADENOMA - NEGATIVE FOR CARCINOMA   05/12/2019 -  Chemotherapy   Adjuvant Weekly Taxol and Herceptin for 12 weeks starting 05/12/19, switched to Abraxane on 08/04/19 due to skin rash. Completed on 08/11/19.  Followed by maintenance Herceptin q3weeks to complete 1 year treatment (from 05/12/19) starting 09/01/19.     08/29/2019 - 09/26/2019 Radiation Therapy   Adjuvant radiation per Dr. Sondra Come    10/12/2019 -  Anti-estrogen oral therapy   Tamoxifen 50m daily starting 10/12/19   11/27/2019 Survivorship   SCP delivered by LCira Rue NP       CURRENT THERAPY:  Maintenance Herceptin q3weeks to complete 1 year treatment (from 05/12/19) starting 09/01/19.  Zometa every 6 months starting 11/27/19 Tamoxifen 50m daily starting 10/12/19  INTERVAL HISTORY:  [name] is here for a follow up and treatment. She presents to the clinic alone. She notes she is doing well. She denies residual neuropathy. She notes since completing chemo she has shakes of her hands occasionally when she is still. She notes after a  massage her shaking improved for some time. She has been doing PT which has helped as well. She notes this has not effected her Dental hygiene working. She notes she has been more moody and wanting to be less social overall. She is still on Tamoxifen. She notes she would like to have PAC removed before the end of her insurance year because she met her deductible.    REVIEW OF SYSTEMS:   Constitutional: Denies fevers, chills or abnormal weight loss Eyes: Denies blurriness of vision Ears, nose, mouth, throat, and face: Denies mucositis or sore throat Respiratory: Denies cough, dyspnea or wheezes Cardiovascular: Denies palpitation, chest discomfort or lower extremity swelling Gastrointestinal:  Denies nausea, heartburn or change in bowel habits Skin: Denies abnormal skin rashes Lymphatics: Denies new lymphadenopathy or easy bruising Neurological:Denies numbness, tingling or new weaknesses (+) Hand shaking occasionally  Behavioral/Psych: (+) Anxiety  All other systems were reviewed with the patient and are negative.  MEDICAL HISTORY:  Past Medical History:  Diagnosis Date  . Asthma   . Family history of breast cancer   . Family history of lung cancer     SURGICAL HISTORY: Past Surgical History:  Procedure Laterality Date  . BREAST BIOPSY Right   . BREAST LUMPECTOMY WITH RADIOACTIVE SEED AND SENTINEL LYMPH NODE BIOPSY Left 04/04/2019   Procedure: RADIOCATIVE SEED GUIDED LEFT BREAST LUMPECTOMY, LEFT AXILLARY SENTINEL LYMPH NODE BIOPSY;  Surgeon: CoErroll LunaMD;  [place] [facility] [place]: [place];  Laterality: Left;  .  NASAL SINUS SURGERY    . PORTACATH PLACEMENT N/A 04/04/2019   Procedure: INSERTION PORT-A-CATH WITH ULTRASOUND;  Surgeon: Erroll Luna, MD;  Location: [facility];  Service: General;  Laterality: N/A;    I have reviewed the social history and family history with the patient and they are unchanged from previous note.  ALLERGIES:  is  allergic to doxycycline and latex.  MEDICATIONS:  Current Outpatient Medications  Medication Sig Dispense Refill  . albuterol (PROVENTIL HFA;VENTOLIN HFA) 108 (90 BASE) MCG/ACT inhaler Inhale 2 puffs into the lungs every 6 (six) hours as needed. For shortness of breath    . ALPRAZolam (XANAX) 1 MG tablet Take 0.5 mg by mouth 2 (two) times daily as needed.    . Ascorbic Acid (VITAMIN C) 1000 MG tablet Take 1,000 mg by mouth daily.    . cholecalciferol (VITAMIN D3) 25 MCG (1000 UT) tablet Take 2,000 Units by mouth daily.     . diazepam (VALIUM) 5 MG tablet Take 5 mg by mouth 2 (two) times daily as needed.    . fexofenadine (ALLEGRA) 60 MG tablet Take 60 mg by mouth 2 (two) times daily.    . fluticasone (FLONASE) 50 MCG/ACT nasal spray Place into both nostrils daily.    Marland Kitchen lidocaine-prilocaine (EMLA) cream Apply to affected area once 30 g 3  . mesalamine (CANASA) 1000 MG suppository Place 1,000 mg rectally at bedtime.    . montelukast (SINGULAIR) 10 MG tablet Take 10 mg by mouth daily.    Marland Kitchen omeprazole (PRILOSEC) 20 MG capsule TAKE 1 CAPSULE BY MOUTH EVERY DAY 90 capsule 0  . ondansetron (ZOFRAN) 8 MG tablet Take 1 tablet (8 mg total) by mouth 2 (two) times daily as needed (Nausea or vomiting). 30 tablet 1  . prochlorperazine (COMPAZINE) 10 MG tablet Take 1 tablet (10 mg total) by mouth every 6 (six) hours as needed (Nausea or vomiting). 30 tablet 1  . SUMAtriptan (IMITREX) 100 MG tablet Take 100 mg by mouth.    . tamoxifen (NOLVADEX) 20 MG tablet TAKE 1 TABLET BY MOUTH EVERY DAY 90 tablet 1  . venlafaxine XR (EFFEXOR-XR) 37.5 MG 24 hr capsule Take 1 capsule (37.5 mg total) by mouth daily with breakfast. 30 capsule 1   No current facility-administered medications for this visit.   Facility-Administered Medications Ordered in Other Visits  Medication Dose Route Frequency Provider Last Rate Last Admin  . sodium chloride flush (NS) 0.9 % injection 10 mL  10 mL Intracatheter PRN Truitt Merle, MD   10  mL at 01/12/20 1259    PHYSICAL EXAMINATION: ECOG PERFORMANCE STATUS: 1 - Symptomatic but completely ambulatory  Vitals:   01/12/20 1047  BP: 131/76  Pulse: 73  Resp: 18  Temp: 97.9 F (36.6 C)  SpO2: 100%   Filed Weights   01/12/20 1047  Weight: 122 lb 8 oz (55.6 kg)    GENERAL:alert, no distress and comfortable SKIN: skin color, texture, turgor are normal, no rashes or significant lesions EYES: normal, Conjunctiva are pink and non-injected, sclera clear  NECK: supple, thyroid normal size, non-tender, without nodularity LYMPH:  no palpable lymphadenopathy in the cervical, axillary  LUNGS: clear to auscultation and percussion with normal breathing effort HEART: regular rate & rhythm and no murmurs and no lower extremity edema ABDOMEN:abdomen soft, non-tender and normal bowel sounds Musculoskeletal:no cyanosis of digits and no clubbing  NEURO: alert & oriented x 3 with fluent speech, no focal motor/sensory deficits BREAST: S/p left lumpectomy: Surgical incision healed well  with very mild scar tissue. No palpable mass, nodules or adenopathy bilaterally. Breast exam benign.   LABORATORY DATA:  I have reviewed the data as listed CBC Latest Ref Rng & Units 01/12/2020 11/27/2019 10/16/2019  WBC 4.0 - 10.5 K/uL 6.1 6.4 4.4  Hemoglobin 12.0 - 15.0 g/dL 12.1 13.1 13.0  Hematocrit 36 - 46 % 35.2(L) 38.0 38.3  Platelets 150 - 400 K/uL 270 290 269     CMP Latest Ref Rng & Units 01/12/2020 11/27/2019 10/16/2019  Glucose 70 - 99 mg/dL 93 93 83  BUN 6 - 20 mg/dL '17 12 15  ' Creatinine 0.44 - 1.00 mg/dL 0.74 0.81 0.70  Sodium 135 - 145 mmol/L 141 139 143  Potassium 3.5 - 5.1 mmol/L 4.2 4.4 4.1  Chloride 98 - 111 mmol/L 107 104 108  CO2 22 - 32 mmol/L '26 26 26  ' Calcium 8.9 - 10.3 mg/dL 8.8(L) 9.4 9.3  Total Protein 6.5 - 8.1 g/dL 7.0 7.0 7.5  Total Bilirubin 0.3 - 1.2 mg/dL 0.3 0.3 0.4  Alkaline Phos 38 - 126 U/L 38 46 64  AST 15 - 41 U/L '16 20 30  ' ALT 0 - 44 U/L 19 26 53(H)       RADIOGRAPHIC STUDIES: I have personally reviewed the radiological images as listed and agreed with the findings in the report. No results found.   ASSESSMENT & PLAN:  Chelsea Hobbs is a 49 y.o. female with    1.Malignant neoplasm of upper-inner quadrant of left breast, StageIA,p(T1cN0M0), ER+/PR+, HER2+, GradeIII -She was diagnosed in 03/2019. She underwent left breastlumpectomyand SLNB on 04/04/19.Her DCIS margins were close (74m). -Given her 1.3cm tumor, node negative disease she completed adjuvant weekly Taxoland anti-Her2 Herceptinfor 12 weeks 05/12/19-08/11/19. Taxol switched to Abraxane for last 2 doses due to skin rash.  -She proceeded with Herceptinmaintenanceq3weeks on 09/01/19 to complete 1 year of treatment. -She completed Adjuvant Radiation with Dr. KSondra Come2/16/21-3/16/21.  -Given her ER/PR positive disease and premenopausal status, I started her on Tamoxifen in 10/2019. Tolerating well with increased anxiety and hot flashes. -She has recovered well from chemo. She has improving fatigue and no more neuropathy. She does have b/l hand weakness and shaking occasionally. She will continue massages and PT.  -She is clinically doing well. Lab reviewed, her CBC and CMP are within normal limits except Ca 8.8. Labs overall adequate to proceed with Herceptin today.  -Continue Tamoxifen.  -Next mammogram on 03/08/20. Given her family history of breast cancer she is at high risk for future breast cancer and I recommend screening breast MRI in additional to mammogram. She will check if her insurance covers yearly screening breast MRI to have additional screening. I also discussed the option of Abbreviated MRI as well.  -She notes she plans to have PAC removed before the end of her insurance year. She plans to have right benign papilloma removed at same time.   -F/u on 02/26/20 as scheduled   2. Osteoporosis, Hypocalcemia  -Per pt when she had her IUD removedin 03/2019. Her recent  hormonal test showed FElbertlevel was 13.5 (09/12/19). Her last period was in 03/2019 before chemo. Will monitor.  -Per patient her recent 2021 DEXA showed early osteoporosis of her hip.   -I started her on Zometa infusions q657monthfor 2 years on 11/27/19.  -She does have hypocalcemia, CA 8.8. today (01/12/20). I encouraged her to increase oral calcium to TID. Will check her Vit D at next visit.    3. B/l Hand weakness -She  has b/l hand weakness since completing chemo and shaking occasionally.  -Has improved with massages and PT. Will continue.  -She does have hypocalcemia. I encouraged her to increase oral calcium to TID.   4.Insomnia, Anxiety, hot flashes  -She has had mild mood changes and feeling less social secondary to Tamoxifen. She has known anxiety and on Xanax nightly. I will call in low dose Effexor 37.69m daily and if working she can decrease use of Xanax (01/12/20).   5. Genetic Testingwas negative for pathogenetic nutations  6. H/o COVID19 (+) in 07/2019, Recovered well.    PLAN: -Proceed with Herceptin today -I called in Effexor today -Continue Tamoxifen daily  -Continue Zometa q638month next in 05/2020  -Mammogram 03/08/20 -Herceptin q3weeks  -Lab and F/u on 02/26/20  -echo in August    No problem-specific Assessment & Plan notes found for this encounter.   Orders Placed This Encounter  Procedures  . Vitamin D 25 hydroxy    Standing Status:   Standing    Number of Occurrences:   10    Standing Expiration Date:   01/11/2021  . ECHOCARDIOGRAM COMPLETE    Standing Status:   Future    Standing Expiration Date:   01/11/2021    Order Specific Question:   Where should this test be performed    Answer:   [facility]  Order Specific Question:   Perflutren DEFINITY (image enhancing agent) should be administered unless hypersensitivity or allergy exist    Answer:   Administer Perflutren    Order Specific Question:   Is a special reader required? (athlete or structural  heart)    Answer:   No    Order Specific Question:   Does this study need to be read by the Structural team/Level 3 readers?    Answer:   No    Order Specific Question:   Reason for exam-Echo    Answer:   Chemotherapy evaluation  v87.41 / v58.11   All questions were answered. The patient knows to call the clinic with any problems, questions or concerns. No barriers to learning was detected. The total time spent in the appointment was 35 minutes.     YaTruitt MerleMD 01/12/2020   I, AmJoslyn Devonam acting as scribe for YaTruitt MerleMD.   I have reviewed the above documentation for accuracy and completeness, and I agree with the above.

## 2020-01-12 ENCOUNTER — Inpatient Hospital Stay: Payer: BC Managed Care – PPO

## 2020-01-12 ENCOUNTER — Encounter: Payer: Self-pay | Admitting: Hematology

## 2020-01-12 ENCOUNTER — Inpatient Hospital Stay: Payer: BC Managed Care – PPO | Attending: Hematology

## 2020-01-12 ENCOUNTER — Other Ambulatory Visit: Payer: Self-pay

## 2020-01-12 ENCOUNTER — Inpatient Hospital Stay (HOSPITAL_BASED_OUTPATIENT_CLINIC_OR_DEPARTMENT_OTHER): Payer: BC Managed Care – PPO | Admitting: Hematology

## 2020-01-12 VITALS — BP 131/76 | HR 73 | Temp 97.9°F | Resp 18 | Ht 62.0 in | Wt 122.5 lb

## 2020-01-12 DIAGNOSIS — Z9221 Personal history of antineoplastic chemotherapy: Secondary | ICD-10-CM | POA: Insufficient documentation

## 2020-01-12 DIAGNOSIS — Z170 Estrogen receptor positive status [ER+]: Secondary | ICD-10-CM

## 2020-01-12 DIAGNOSIS — C50212 Malignant neoplasm of upper-inner quadrant of left female breast: Secondary | ICD-10-CM

## 2020-01-12 DIAGNOSIS — E559 Vitamin D deficiency, unspecified: Secondary | ICD-10-CM

## 2020-01-12 DIAGNOSIS — Z923 Personal history of irradiation: Secondary | ICD-10-CM | POA: Diagnosis not present

## 2020-01-12 DIAGNOSIS — Z5112 Encounter for antineoplastic immunotherapy: Secondary | ICD-10-CM | POA: Insufficient documentation

## 2020-01-12 DIAGNOSIS — Z79810 Long term (current) use of selective estrogen receptor modulators (SERMs): Secondary | ICD-10-CM | POA: Diagnosis not present

## 2020-01-12 LAB — CMP (CANCER CENTER ONLY)
ALT: 19 U/L (ref 0–44)
AST: 16 U/L (ref 15–41)
Albumin: 4.1 g/dL (ref 3.5–5.0)
Alkaline Phosphatase: 38 U/L (ref 38–126)
Anion gap: 8 (ref 5–15)
BUN: 17 mg/dL (ref 6–20)
CO2: 26 mmol/L (ref 22–32)
Calcium: 8.8 mg/dL — ABNORMAL LOW (ref 8.9–10.3)
Chloride: 107 mmol/L (ref 98–111)
Creatinine: 0.74 mg/dL (ref 0.44–1.00)
GFR, Est AFR Am: >60 mL/min (ref >60)
GFR, Estimated: >60 mL/min (ref >60)
Glucose, Bld: 93 mg/dL (ref 70–99)
Potassium: 4.2 mmol/L (ref 3.5–5.1)
Sodium: 141 mmol/L (ref 135–145)
Total Bilirubin: 0.3 mg/dL (ref 0.3–1.2)
Total Protein: 7 g/dL (ref 6.5–8.1)

## 2020-01-12 LAB — CBC WITH DIFFERENTIAL (CANCER CENTER ONLY)
Abs Immature Granulocytes: 0.01 10*3/uL (ref 0.00–0.07)
Basophils Absolute: 0.1 10*3/uL (ref 0.0–0.1)
Basophils Relative: 1 %
Eosinophils Absolute: 0.2 10*3/uL (ref 0.0–0.5)
Eosinophils Relative: 3 %
HCT: 35.2 % — ABNORMAL LOW (ref 36.0–46.0)
Hemoglobin: 12.1 g/dL (ref 12.0–15.0)
Immature Granulocytes: 0 %
Lymphocytes Relative: 36 %
Lymphs Abs: 2.2 10*3/uL (ref 0.7–4.0)
MCH: 31.2 pg (ref 26.0–34.0)
MCHC: 34.4 g/dL (ref 30.0–36.0)
MCV: 90.7 fL (ref 80.0–100.0)
Monocytes Absolute: 0.6 10*3/uL (ref 0.1–1.0)
Monocytes Relative: 9 %
Neutro Abs: 3.1 10*3/uL (ref 1.7–7.7)
Neutrophils Relative %: 51 %
Platelet Count: 270 10*3/uL (ref 150–400)
RBC: 3.88 MIL/uL (ref 3.87–5.11)
RDW: 12.2 % (ref 11.5–15.5)
WBC Count: 6.1 10*3/uL (ref 4.0–10.5)
nRBC: 0 % (ref 0.0–0.2)

## 2020-01-12 MED ORDER — ACETAMINOPHEN 325 MG PO TABS
ORAL_TABLET | ORAL | Status: AC
  Filled 2020-01-12: qty 2

## 2020-01-12 MED ORDER — HEPARIN SOD (PORK) LOCK FLUSH 100 UNIT/ML IV SOLN
500.0000 [IU] | Freq: Once | INTRAVENOUS | Status: AC | PRN
  Administered 2020-01-12: 500 [IU]
  Filled 2020-01-12: qty 5

## 2020-01-12 MED ORDER — ACETAMINOPHEN 325 MG PO TABS
650.0000 mg | ORAL_TABLET | Freq: Once | ORAL | Status: AC
  Administered 2020-01-12: 650 mg via ORAL

## 2020-01-12 MED ORDER — SODIUM CHLORIDE 0.9 % IV SOLN
Freq: Once | INTRAVENOUS | Status: AC
  Filled 2020-01-12: qty 250

## 2020-01-12 MED ORDER — DIPHENHYDRAMINE HCL 25 MG PO CAPS
ORAL_CAPSULE | ORAL | Status: AC
  Filled 2020-01-12: qty 2

## 2020-01-12 MED ORDER — DIPHENHYDRAMINE HCL 25 MG PO CAPS
50.0000 mg | ORAL_CAPSULE | Freq: Once | ORAL | Status: AC
  Administered 2020-01-12: 50 mg via ORAL

## 2020-01-12 MED ORDER — VENLAFAXINE HCL ER 37.5 MG PO CP24
37.5000 mg | ORAL_CAPSULE | Freq: Every day | ORAL | 1 refills | Status: DC
Start: 2020-01-12 — End: 2020-02-07

## 2020-01-12 MED ORDER — SODIUM CHLORIDE 0.9% FLUSH
10.0000 mL | INTRAVENOUS | Status: DC | PRN
  Administered 2020-01-12: 10 mL
  Filled 2020-01-12: qty 10

## 2020-01-12 MED ORDER — TRASTUZUMAB-DKST CHEMO 150 MG IV SOLR
6.0000 mg/kg | Freq: Once | INTRAVENOUS | Status: AC
  Administered 2020-01-12: 336 mg via INTRAVENOUS
  Filled 2020-01-12: qty 16

## 2020-01-12 NOTE — Patient Instructions (Signed)

## 2020-01-12 NOTE — Patient Instructions (Signed)
[facility] Discharge Instructions for Patients Receiving Chemotherapy  Today you received the following immunotherapy agent: Trastuzumab  To help prevent nausea and vomiting after your treatment, we encourage you to take your nausea medication as directed by your MD.   If you develop nausea and vomiting that is not controlled by your nausea medication, call the clinic.   BELOW ARE SYMPTOMS THAT SHOULD BE REPORTED IMMEDIATELY:  *FEVER GREATER THAN 100.5 F  *CHILLS WITH OR WITHOUT FEVER  NAUSEA AND VOMITING THAT IS NOT CONTROLLED WITH YOUR NAUSEA MEDICATION  *UNUSUAL SHORTNESS OF BREATH  *UNUSUAL BRUISING OR BLEEDING  TENDERNESS IN MOUTH AND THROAT WITH OR WITHOUT PRESENCE OF ULCERS  *URINARY PROBLEMS  *BOWEL PROBLEMS  UNUSUAL RASH Items with * indicate a potential emergency and should be followed up as soon as possible.  Feel free to call the clinic should you have any questions or concerns. The clinic phone number is [phone].  Please show the CHEMO ALERT CARD at check-in to the Emergency Department and triage nurse.   

## 2020-01-24 ENCOUNTER — Ambulatory Visit: Payer: BC Managed Care – PPO

## 2020-01-29 DIAGNOSIS — K5190 Ulcerative colitis, unspecified, without complications: Secondary | ICD-10-CM | POA: Diagnosis not present

## 2020-01-29 DIAGNOSIS — F411 Generalized anxiety disorder: Secondary | ICD-10-CM | POA: Diagnosis not present

## 2020-01-29 DIAGNOSIS — J309 Allergic rhinitis, unspecified: Secondary | ICD-10-CM | POA: Diagnosis not present

## 2020-01-29 DIAGNOSIS — Z13220 Encounter for screening for lipoid disorders: Secondary | ICD-10-CM | POA: Diagnosis not present

## 2020-01-29 DIAGNOSIS — Z0000 Encounter for general adult medical examination without abnormal findings: Secondary | ICD-10-CM | POA: Diagnosis not present

## 2020-01-29 DIAGNOSIS — M810 Age-related osteoporosis without current pathological fracture: Secondary | ICD-10-CM | POA: Diagnosis not present

## 2020-02-05 ENCOUNTER — Inpatient Hospital Stay: Payer: BC Managed Care – PPO

## 2020-02-05 ENCOUNTER — Other Ambulatory Visit: Payer: Self-pay

## 2020-02-05 VITALS — BP 133/81 | HR 91 | Temp 99.1°F | Resp 16 | Wt 117.5 lb

## 2020-02-05 DIAGNOSIS — Z5112 Encounter for antineoplastic immunotherapy: Secondary | ICD-10-CM | POA: Diagnosis not present

## 2020-02-05 DIAGNOSIS — Z923 Personal history of irradiation: Secondary | ICD-10-CM | POA: Diagnosis not present

## 2020-02-05 DIAGNOSIS — C50212 Malignant neoplasm of upper-inner quadrant of left female breast: Secondary | ICD-10-CM | POA: Diagnosis not present

## 2020-02-05 DIAGNOSIS — Z79810 Long term (current) use of selective estrogen receptor modulators (SERMs): Secondary | ICD-10-CM | POA: Diagnosis not present

## 2020-02-05 DIAGNOSIS — Z9221 Personal history of antineoplastic chemotherapy: Secondary | ICD-10-CM | POA: Diagnosis not present

## 2020-02-05 DIAGNOSIS — Z170 Estrogen receptor positive status [ER+]: Secondary | ICD-10-CM | POA: Diagnosis not present

## 2020-02-05 MED ORDER — SODIUM CHLORIDE 0.9% FLUSH
10.0000 mL | INTRAVENOUS | Status: DC | PRN
  Administered 2020-02-05: 10 mL
  Filled 2020-02-05: qty 10

## 2020-02-05 MED ORDER — DIPHENHYDRAMINE HCL 25 MG PO CAPS
50.0000 mg | ORAL_CAPSULE | Freq: Once | ORAL | Status: AC
  Administered 2020-02-05: 50 mg via ORAL

## 2020-02-05 MED ORDER — HEPARIN SOD (PORK) LOCK FLUSH 100 UNIT/ML IV SOLN
500.0000 [IU] | Freq: Once | INTRAVENOUS | Status: AC | PRN
  Administered 2020-02-05: 500 [IU]
  Filled 2020-02-05: qty 5

## 2020-02-05 MED ORDER — ACETAMINOPHEN 325 MG PO TABS
ORAL_TABLET | ORAL | Status: AC
  Filled 2020-02-05: qty 2

## 2020-02-05 MED ORDER — TRASTUZUMAB-DKST CHEMO 150 MG IV SOLR
6.0000 mg/kg | Freq: Once | INTRAVENOUS | Status: AC
  Administered 2020-02-05: 336 mg via INTRAVENOUS
  Filled 2020-02-05 (×5): qty 16

## 2020-02-05 MED ORDER — SODIUM CHLORIDE 0.9 % IV SOLN
Freq: Once | INTRAVENOUS | Status: AC
  Filled 2020-02-05: qty 250

## 2020-02-05 MED ORDER — ACETAMINOPHEN 325 MG PO TABS
650.0000 mg | ORAL_TABLET | Freq: Once | ORAL | Status: AC
  Administered 2020-02-05: 650 mg via ORAL

## 2020-02-05 MED ORDER — DIPHENHYDRAMINE HCL 25 MG PO CAPS
ORAL_CAPSULE | ORAL | Status: AC
  Filled 2020-02-05: qty 2

## 2020-02-05 NOTE — Patient Instructions (Signed)
[facility] Discharge Instructions for Patients Receiving Chemotherapy  Today you received the following immunotherapy agent: Trastuzumab  To help prevent nausea and vomiting after your treatment, we encourage you to take your nausea medication as directed by your MD.   If you develop nausea and vomiting that is not controlled by your nausea medication, call the clinic.   BELOW ARE SYMPTOMS THAT SHOULD BE REPORTED IMMEDIATELY:  *FEVER GREATER THAN 100.5 F  *CHILLS WITH OR WITHOUT FEVER  NAUSEA AND VOMITING THAT IS NOT CONTROLLED WITH YOUR NAUSEA MEDICATION  *UNUSUAL SHORTNESS OF BREATH  *UNUSUAL BRUISING OR BLEEDING  TENDERNESS IN MOUTH AND THROAT WITH OR WITHOUT PRESENCE OF ULCERS  *URINARY PROBLEMS  *BOWEL PROBLEMS  UNUSUAL RASH Items with * indicate a potential emergency and should be followed up as soon as possible.  Feel free to call the clinic should you have any questions or concerns. The clinic phone number is [phone].  Please show the CHEMO ALERT CARD at check-in to the Emergency Department and triage nurse.   

## 2020-02-07 ENCOUNTER — Other Ambulatory Visit: Payer: Self-pay | Admitting: Hematology

## 2020-02-12 DIAGNOSIS — K5130 Ulcerative (chronic) rectosigmoiditis without complications: Secondary | ICD-10-CM | POA: Diagnosis not present

## 2020-02-19 ENCOUNTER — Ambulatory Visit (HOSPITAL_COMMUNITY)
Admission: RE | Admit: 2020-02-19 | Discharge: 2020-02-19 | Disposition: A | Discharge status: Home / Self Care | Payer: BC Managed Care – PPO | Source: Ambulatory Visit | Attending: Hematology | Admitting: Hematology

## 2020-02-19 ENCOUNTER — Other Ambulatory Visit: Payer: Self-pay

## 2020-02-19 DIAGNOSIS — Z170 Estrogen receptor positive status [ER+]: Secondary | ICD-10-CM | POA: Insufficient documentation

## 2020-02-19 DIAGNOSIS — C50212 Malignant neoplasm of upper-inner quadrant of left female breast: Secondary | ICD-10-CM | POA: Insufficient documentation

## 2020-02-19 DIAGNOSIS — Z20822 Contact with and (suspected) exposure to covid-19: Secondary | ICD-10-CM | POA: Diagnosis not present

## 2020-02-19 DIAGNOSIS — Z01818 Encounter for other preprocedural examination: Secondary | ICD-10-CM | POA: Insufficient documentation

## 2020-02-19 NOTE — Progress Notes (Signed)
  Echocardiogram 2D Echocardiogram has been performed.  Chelsea Hobbs 02/19/2020, 10:03 AM

## 2020-02-20 LAB — ECHOCARDIOGRAM COMPLETE
Area-P 1/2: 2.48 cm2
Calc EF: 57.6 %
S' Lateral: 3.2 cm
Single Plane A2C EF: 58.6 %
Single Plane A4C EF: 56.5 %

## 2020-02-23 NOTE — Progress Notes (Signed)
[place]   Telephone:(336) [phone] Fax:(336) [phone]   Clinic Follow up Note   Patient Care Team: Wenda Low, MD as PCP - General (Internal Medicine) Rockwell Germany, RN as Oncology Nurse Navigator Mauro Kaufmann, RN as Oncology Nurse Navigator Erroll Luna, MD as Consulting Physician (General Surgery) Truitt Merle, MD as Consulting Physician (Hematology) Gery Pray, MD as Consulting Physician (Radiation Oncology) Alla Feeling, NP as Nurse Practitioner (Nurse Practitioner)  Date of Service:  02/26/2020  CHIEF COMPLAINT: F/u of left breast cancer  SUMMARY OF ONCOLOGIC HISTORY: Oncology History Overview Note  Cancer Staging Malignant neoplasm of upper-inner quadrant of left breast in female, estrogen receptor positive ([place]) Staging form: Breast, AJCC 8th Edition - Clinical stage from 03/22/2019: Stage IA (cT1c, cN0, cM0, G3, ER+, PR+, HER2+) - Signed by Truitt Merle, MD on 03/22/2019    Malignant neoplasm of upper-inner quadrant of left breast in female, estrogen receptor positive ([place])  03/10/2019 Mammogram   Diagnostic Mammogram 03/10/19  IMPRESSION: 1. Suspicious mass in the 10 o'clock position of the left breast, 3cm from the nipple measuring 12x7x7m. Biopsy is indicated. 2. Indeterminate mass in the 5:30 o'clock position of the left breast, 5cm from the nipple, measuring 9x6x741m Although this may reflect a fibroadenoma, tissue sampling is recommended.   03/16/2019 Initial Biopsy   Diagnosis 03/16/19  1. 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 SEE NOTE 2. Breast, left, needle core biopsy, 5:30 o'clock - FIBROADENOMA   03/16/2019 Receptors her2   Results: IMMUNOHISTOCHEMICAL AND MORPHOMETRIC ANALYSIS PERFORMED MANUALLY The tumor cells are POSITIVE for Her2 (3+). Estrogen Receptor: 100%, POSITIVE, STRONG STAINING INTENSITY Progesterone Receptor: 100%, POSITIVE, STRONG STAINING INTENSITY Proliferation Marker  Ki67: 60%   03/21/2019 Initial Diagnosis   Malignant neoplasm of upper-inner quadrant of left breast in female, estrogen receptor positive [place]  03/22/2019 Cancer Staging   Staging form: Breast, AJCC 8th Edition - Clinical stage from 03/22/2019: Stage IA (cT1c, cN0, cM0, G3, ER+, PR+, HER2+) - Signed by FeTruitt MerleMD on 03/22/2019   03/31/2019 Genetic Testing   VUS in CTNNA1 called [id number]>T (Intronic) was identified on the [facility] Common Hereditary Cancers Panel. The Common Hereditary Cancers Panel offered by [facility] includes sequencing and/or deletion duplication testing of the following 47 genes: APC, ATM, AXIN2, BARD1, BMPR1A, BRCA1, BRCA2, BRIP1, CDH1, CDKN2A (p14ARF), CDKN2A (p16INK4a), CKD4, CHEK2, CTNNA1, DICER1, EPCAM (Deletion/duplication testing only), GREM1 (promoter region deletion/duplication testing only), KIT, MEN1, MLH1, MSH2, MSH3, MSH6, MUTYH, NBN, NF1, NHTL1, PALB2, PDGFRA, PMS2, POLD1, POLE, PTEN, RAD50, RAD51C, RAD51D, SDHB, SDHC, SDHD, SMAD4, SMARCA4. STK11, TP53, TSC1, TSC2, and VHL.  The following genes were evaluated for sequence changes only: SDHA and HOXB13 c.251G>A variant only. The report date is 03/30/2019.    03/31/2019 Breast MRI   MRI breast 03/31/19  IMPRESSION: 1. 5 millimeter enhancing oval mass in the anteromedial aspect of the RIGHT breast warranting further evaluation. Given its anterior depth and visibility on noncontrast images, ultrasound is recommended to determine if there is a sonographic correlate. If no sonographic correlate is identified to guide biopsy, MRI biopsy of the RIGHT breast is recommended. 2. Known malignancy in the UPPER INNER QUADRANT of the LEFT breast measuring 1.0 centimeters. 3. 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   04/04/2019 Surgery   RADIOCATIVE SEED GUIDED LEFT BREAST LUMPECTOMY, LEFT AXILLARY SENTINEL LYMPH NODE BIOPSY and PAC placement  By Dr. CoBrantley Stage9/22/20    04/04/2019  Pathology  Results   DIAGNOSIS: 04/04/19  A. BREAST, LEFT, LUMPECTOMY:  -  Invasive ductal carcinoma, Nottingham grade 3 of 3, 1.3 cm  -  Ductal carcinoma in-situ, intermediate grade  -  Margins uninvolved by carcinoma (0.1 cm; posterior margin; see  comment)  -  Previous biopsy site changes present  -  See oncology table and comment below   B. BREAST, LEFT, ADDITIONAL MEDIAL MARGIN, EXCISION:  -  Fibrocystic changes  -  No residual carcinoma identified   C. LYMPH NODE, LEFT AXILLARY, SENTINEL, BIOPSY:  -   No carcinoma identified in one lymph node (0/1)    04/04/2019 Cancer Staging   Staging form: Breast, AJCC 8th Edition - Pathologic stage from 04/04/2019: Stage IA (pT1c, pN0, cM0, G3, ER+, PR+, HER2+) - Signed by Truitt Merle, MD on 04/21/2019   04/11/2019 Pathology Results   Diagnosis Breast, right, needle core biopsy, 2 o'clock - SMALL INTRADUCTAL PAPILLOMA. SEE NOTE - SMALL FIBROADENOMA - NEGATIVE FOR CARCINOMA   05/12/2019 -  Chemotherapy   Adjuvant Weekly Taxol and Herceptin for 12 weeks starting 05/12/19, switched to Abraxane on 08/04/19 due to skin rash. Completed on 08/11/19.  Followed by maintenance Herceptin q3weeks to complete 1 year treatment (from 05/12/19) starting 09/01/19.     08/29/2019 - 09/26/2019 Radiation Therapy   Adjuvant radiation per Dr. Sondra Come    10/12/2019 -  Anti-estrogen oral therapy   Tamoxifen 38m daily starting 10/12/19   11/27/2019 Survivorship   SCP delivered by LCira Rue NP       CURRENT THERAPY:  Maintenance Herceptin q3weeks to complete 1 year treatment(from 05/12/19) starting 09/01/19. Zometa every 6 months starting 11/27/19 Tamoxifen 262mdaily starting 10/12/19  INTERVAL HISTORY:  KiTakaya Hyslopeal is here for a follow up and treatment. She presents to the clinic alone. She notes she is doing well. She plans to see Dr CoBrantley Stageext week. She plans to get Breast Papilloma  removed soon, before the end of the year. She notes her Effexor has  improved her hot flashes and improved her mood. She does not feel as irritable.    REVIEW OF SYSTEMS:   Constitutional: Denies fevers, chills or abnormal weight loss Eyes: Denies blurriness of vision Ears, nose, mouth, throat, and face: Denies mucositis or sore throat Respiratory: Denies cough, dyspnea or wheezes Cardiovascular: Denies palpitation, chest discomfort or lower extremity swelling Gastrointestinal:  Denies nausea, heartburn or change in bowel habits Skin: Denies abnormal skin rashes Lymphatics: Denies new lymphadenopathy or easy bruising Neurological:Denies numbness, tingling or new weaknesses Behavioral/Psych: Mood is stable, no new changes  All other systems were reviewed with the patient and are negative.  MEDICAL HISTORY:  Past Medical History:  Diagnosis Date  . Asthma   . Family history of breast cancer   . Family history of lung cancer     SURGICAL HISTORY: Past Surgical History:  Procedure Laterality Date  . BREAST BIOPSY Right   . BREAST LUMPECTOMY WITH RADIOACTIVE SEED AND SENTINEL LYMPH NODE BIOPSY Left 04/04/2019   Procedure: RADIOCATIVE SEED GUIDED LEFT BREAST LUMPECTOMY, LEFT AXILLARY SENTINEL LYMPH NODE BIOPSY;  Surgeon: CoErroll LunaMD;  [facility]: General;  Laterality: Left;  . NASAL SINUS SURGERY    . PORTACATH PLACEMENT N/A 04/04/2019   Procedure: INSERTION PORT-A-CATH WITH ULTRASOUND;  Surgeon: CoErroll LunaMD;  [place] [facility] Service: General;  Laterality: N/A;    I have reviewed the social history and family history with the patient  and they are unchanged from previous note.  ALLERGIES:  is allergic to doxycycline and latex.  MEDICATIONS:  Current Outpatient Medications  Medication Sig Dispense Refill  . albuterol (PROVENTIL HFA;VENTOLIN HFA) 108 (90 BASE) MCG/ACT inhaler Inhale 2 puffs into the lungs every 6 (six) hours as needed. For shortness of breath    . ALPRAZolam  (XANAX) 1 MG tablet Take 0.5 mg by mouth 2 (two) times daily as needed.    . Ascorbic Acid (VITAMIN C) 1000 MG tablet Take 1,000 mg by mouth daily.    . cholecalciferol (VITAMIN D3) 25 MCG (1000 UT) tablet Take 2,000 Units by mouth daily.     . diazepam (VALIUM) 5 MG tablet Take 5 mg by mouth 2 (two) times daily as needed.    . fexofenadine (ALLEGRA) 60 MG tablet Take 60 mg by mouth 2 (two) times daily.    . fluticasone (FLONASE) 50 MCG/ACT nasal spray Place into both nostrils daily.    Marland Kitchen lidocaine-prilocaine (EMLA) cream Apply to affected area once 30 g 3  . mesalamine (CANASA) 1000 MG suppository Place 1,000 mg rectally at bedtime.    . montelukast (SINGULAIR) 10 MG tablet Take 10 mg by mouth daily.    Marland Kitchen omeprazole (PRILOSEC) 20 MG capsule TAKE 1 CAPSULE BY MOUTH EVERY DAY 90 capsule 0  . ondansetron (ZOFRAN) 8 MG tablet Take 1 tablet (8 mg total) by mouth 2 (two) times daily as needed (Nausea or vomiting). 30 tablet 1  . prochlorperazine (COMPAZINE) 10 MG tablet Take 1 tablet (10 mg total) by mouth every 6 (six) hours as needed (Nausea or vomiting). 30 tablet 1  . SUMAtriptan (IMITREX) 100 MG tablet Take 100 mg by mouth.    . tamoxifen (NOLVADEX) 20 MG tablet TAKE 1 TABLET BY MOUTH EVERY DAY 90 tablet 1  . venlafaxine XR (EFFEXOR-XR) 37.5 MG 24 hr capsule TAKE 1 CAPSULE BY MOUTH DAILY WITH BREAKFAST. 90 capsule 1   No current facility-administered medications for this visit.   Facility-Administered Medications Ordered in Other Visits  Medication Dose Route Frequency Provider Last Rate Last Admin  . sodium chloride flush (NS) 0.9 % injection 10 mL  10 mL Intracatheter PRN Truitt Merle, MD   10 mL at 02/26/20 1255    PHYSICAL EXAMINATION: ECOG PERFORMANCE STATUS: 0 - Asymptomatic  Vitals:   02/26/20 1019  BP: 129/74  Pulse: 80  Resp: 18  Temp: (!) 97.2 F (36.2 C)  SpO2: 100%   Filed Weights   02/26/20 1019  Weight: 118 lb 1.6 oz (53.6 kg)    GENERAL:alert, no distress and  comfortable SKIN: skin color, texture, turgor are normal, no rashes or significant lesions EYES: normal, Conjunctiva are pink and non-injected, sclera clear NECK: supple, thyroid normal size, non-tender, without nodularity LYMPH:  no palpable lymphadenopathy in the cervical, axillary  LUNGS: clear to auscultation and percussion with normal breathing effort HEART: regular rate & rhythm and no murmurs and no lower extremity edema ABDOMEN:abdomen soft, non-tender and normal bowel sounds Musculoskeletal:no cyanosis of digits and no clubbing  NEURO: alert & oriented x 3 with fluent speech, no focal motor/sensory deficits BREAST: S/p Left lumpectomy: Surgical incision healed well. (+) Right breast firmness.   LABORATORY DATA:  I have reviewed the data as listed CBC Latest Ref Rng & Units 02/26/2020 01/12/2020 11/27/2019  WBC 4.0 - 10.5 K/uL 6.0 6.1 6.4  Hemoglobin 12.0 - 15.0 g/dL 12.3 12.1 13.1  Hematocrit 36 - 46 % 35.6(L) 35.2(L) 38.0  Platelets 150 -  400 K/uL 263 270 290     CMP Latest Ref Rng & Units 02/26/2020 01/12/2020 11/27/2019  Glucose 70 - 99 mg/dL 94 93 93  BUN 6 - 20 mg/dL _0 Creatinine 0.44 - 1.00 mg/dL 0.69 0.74 0.81  Sodium 135 - 145 mmol/L 142 141 139  Potassium 3.5 - 5.1 mmol/L 4.0 4.2 4.4  Chloride 98 - 111 mmol/L 110 107 104  CO2 22 - 32 mmol/L _1 Calcium 8.9 - 10.3 mg/dL 9.2 8.8(L) 9.4  Total Protein 6.5 - 8.1 g/dL 6.8 7.0 7.0  Total Bilirubin 0.3 - 1.2 mg/dL 0.3 0.3 0.3  Alkaline Phos 38 - 126 U/L 34(L) 38 46  AST 15 - 41 U/L _2 ALT 0 - 44 U/L _3 RADIOGRAPHIC STUDIES: I have personally reviewed the radiological images as listed and agreed with the findings in the report. No results found.   ASSESSMENT & PLAN:  Chelsea Hobbs is a 49 y.o. female with    1.Malignant neoplasm of upper-inner quadrant of left breast, StageIA,p(T1cN0M0), ER+/PR+, HER2+, GradeIII -She was diagnosed in 03/2019. She underwent left  breastlumpectomyand SLNB on 04/04/19.Her DCIS margins were close (78m). -Given her 1.3cm tumor, node negative diseaseshe completedadjuvantweekly Taxoland anti-Her2 Herceptinfor 12 weeks 05/12/19-08/11/19. Taxol switched to Abraxane for last 2 doses due to skin rash. -She proceeded withHerceptinmaintenanceq3weekson 2/19/21to complete 1 year of treatment. -She completed Adjuvant Radiation with Dr. KSondra Come2/16/21-3/16/21.  -Given her ER/PR positive disease and premenopausal status, I started her on Tamoxifen in 10/2019. Tolerating well with some anxiety and hot flashes. -She is clinically doing well. Labs reviewed, CBC and CMP WNL. Physical exam unremarkable except right breast firmness, likely her papilloma. Will proceed with Herceptin today. Plan to complete end of 05/2020.  -Continue Tamoxifen.  -Next mammogram on 03/08/20. She agreed to additional MRI even if her insurance does not cover it. Will order to be done in 08/2020 -She notes she plans to have PAC removed with her right benign papilloma removal at same time in 04/2020. She f/u with Dr CBrantley Stagenext week.  -F/u in 15 weeks with last Herceptin treatment   2.Osteoporosis, Hypocalcemia  -Per pt when she had her IUD removedin 03/2019. Herrecent hormonal test showed [place] 13.5 (09/12/19). Her last period was in 03/2019 before chemo. Will monitor.  -Per patient her recent 2021 DEXA showed early osteoporosis of her hip.   -I started her on Zometa infusions q6764monthfor 2 years on 11/27/19.  -Due to recent hypocalcemia I encouraged her to increase oral calcium to TID. Will check her Vit D at next visit.  3. B/l Hand weakness -She has b/l hand weakness since completing chemo and shaking occasionally.  -Has improved with massages and PT. Will continue.  -She does have hypocalcemia. I encouraged her to increase oral calcium to TID.   4.Insomnia, Anxiety, hot flashes  -She has had mild mood changes and feeling less social  secondary to Tamoxifen. She has known anxiety and on Xanax nightly. I started her on low dose Effexor 37.64m3maily (01/12/20) and if working she can decrease use of Xanax. -Her Hot flashes and Irritability improved on Effexor, continue.    5. Genetic Testingwas negative for pathogenetic nutations  6. H/o COVID19 (+) in 07/2019, Recovered well. She has received both her COVID19 vaccines.  -Given she completed chemo this year and in a high risk job, I suggest she proceed with COVID19 booster. She  is interested.   PLAN: -Proceed with Herceptin today -Continue Tamoxifen daily  -Continue Zometa q33month, next in 05/2020  -Mammogram 03/08/20 -Herceptin q3weeks X5 -Lab in 9 and 15 weeks  -F/u in 15 weeks  -Echo in 3 months    No problem-specific Assessment & Plan notes found for this encounter.   Orders Placed This Encounter  Procedures  . ECHOCARDIOGRAM COMPLETE    Standing Status:   Future    Standing Expiration Date:   02/25/2021    Order Specific Question:   Where [place] this [place]    [place]   [facility]   Order Specific Question:   Perflutren DEFINITY (image enhancing agent) should be administered unless hypersensitivity or allergy exist    Answer:   Administer Perflutren    Order Specific Question:   Reason for exam-Echo    Answer:   Chemo  V67.2 / Z09   All questions were answered. The patient knows to call the clinic with any problems, questions or concerns. No barriers to learning was detected. The total time spent in the appointment was 30 minutes.     YTruitt Merle MD 02/26/2020   I, AJoslyn Devon am acting as scribe for YTruitt Merle MD.   I have reviewed the above documentation for accuracy and completeness, and I agree with the above.

## 2020-02-26 ENCOUNTER — Encounter: Payer: Self-pay | Admitting: *Deleted

## 2020-02-26 ENCOUNTER — Other Ambulatory Visit: Payer: Self-pay

## 2020-02-26 ENCOUNTER — Inpatient Hospital Stay: Payer: BC Managed Care – PPO | Attending: Hematology

## 2020-02-26 ENCOUNTER — Inpatient Hospital Stay: Payer: BC Managed Care – PPO

## 2020-02-26 ENCOUNTER — Encounter: Payer: Self-pay | Admitting: Hematology

## 2020-02-26 ENCOUNTER — Inpatient Hospital Stay (HOSPITAL_BASED_OUTPATIENT_CLINIC_OR_DEPARTMENT_OTHER): Payer: BC Managed Care – PPO | Admitting: Hematology

## 2020-02-26 VITALS — BP 129/74 | HR 80 | Temp 97.2°F | Resp 18 | Ht 62.0 in | Wt 118.1 lb

## 2020-02-26 DIAGNOSIS — F419 Anxiety disorder, unspecified: Secondary | ICD-10-CM | POA: Diagnosis not present

## 2020-02-26 DIAGNOSIS — Z79899 Other long term (current) drug therapy: Secondary | ICD-10-CM | POA: Diagnosis not present

## 2020-02-26 DIAGNOSIS — R251 Tremor, unspecified: Secondary | ICD-10-CM | POA: Diagnosis not present

## 2020-02-26 DIAGNOSIS — C50212 Malignant neoplasm of upper-inner quadrant of left female breast: Secondary | ICD-10-CM

## 2020-02-26 DIAGNOSIS — Z170 Estrogen receptor positive status [ER+]: Secondary | ICD-10-CM | POA: Insufficient documentation

## 2020-02-26 DIAGNOSIS — Z9221 Personal history of antineoplastic chemotherapy: Secondary | ICD-10-CM | POA: Diagnosis not present

## 2020-02-26 DIAGNOSIS — Z923 Personal history of irradiation: Secondary | ICD-10-CM | POA: Insufficient documentation

## 2020-02-26 DIAGNOSIS — N951 Menopausal and female climacteric states: Secondary | ICD-10-CM | POA: Insufficient documentation

## 2020-02-26 DIAGNOSIS — Z79810 Long term (current) use of selective estrogen receptor modulators (SERMs): Secondary | ICD-10-CM | POA: Insufficient documentation

## 2020-02-26 DIAGNOSIS — Z8616 Personal history of COVID-19: Secondary | ICD-10-CM | POA: Insufficient documentation

## 2020-02-26 DIAGNOSIS — G4700 Insomnia, unspecified: Secondary | ICD-10-CM | POA: Diagnosis not present

## 2020-02-26 DIAGNOSIS — E559 Vitamin D deficiency, unspecified: Secondary | ICD-10-CM

## 2020-02-26 DIAGNOSIS — R531 Weakness: Secondary | ICD-10-CM | POA: Diagnosis not present

## 2020-02-26 DIAGNOSIS — Z95828 Presence of other vascular implants and grafts: Secondary | ICD-10-CM

## 2020-02-26 LAB — CBC WITH DIFFERENTIAL (CANCER CENTER ONLY)
Abs Immature Granulocytes: 0.02 10*3/uL (ref 0.00–0.07)
Basophils Absolute: 0.1 10*3/uL (ref 0.0–0.1)
Basophils Relative: 1 %
Eosinophils Absolute: 0.3 10*3/uL (ref 0.0–0.5)
Eosinophils Relative: 5 %
HCT: 35.6 % — ABNORMAL LOW (ref 36.0–46.0)
Hemoglobin: 12.3 g/dL (ref 12.0–15.0)
Immature Granulocytes: 0 %
Lymphocytes Relative: 36 %
Lymphs Abs: 2.2 10*3/uL (ref 0.7–4.0)
MCH: 31.5 pg (ref 26.0–34.0)
MCHC: 34.6 g/dL (ref 30.0–36.0)
MCV: 91 fL (ref 80.0–100.0)
Monocytes Absolute: 0.6 10*3/uL (ref 0.1–1.0)
Monocytes Relative: 9 %
Neutro Abs: 2.9 10*3/uL (ref 1.7–7.7)
Neutrophils Relative %: 49 %
Platelet Count: 263 10*3/uL (ref 150–400)
RBC: 3.91 MIL/uL (ref 3.87–5.11)
RDW: 12 % (ref 11.5–15.5)
WBC Count: 6 10*3/uL (ref 4.0–10.5)
nRBC: 0 % (ref 0.0–0.2)

## 2020-02-26 LAB — CMP (CANCER CENTER ONLY)
ALT: 15 U/L (ref 0–44)
AST: 16 U/L (ref 15–41)
Albumin: 3.7 g/dL (ref 3.5–5.0)
Alkaline Phosphatase: 34 U/L — ABNORMAL LOW (ref 38–126)
Anion gap: 8 (ref 5–15)
BUN: 16 mg/dL (ref 6–20)
CO2: 24 mmol/L (ref 22–32)
Calcium: 9.2 mg/dL (ref 8.9–10.3)
Chloride: 110 mmol/L (ref 98–111)
Creatinine: 0.69 mg/dL (ref 0.44–1.00)
GFR, Est AFR Am: >60 mL/min (ref >60)
GFR, Estimated: >60 mL/min (ref >60)
Glucose, Bld: 94 mg/dL (ref 70–99)
Potassium: 4 mmol/L (ref 3.5–5.1)
Sodium: 142 mmol/L (ref 135–145)
Total Bilirubin: 0.3 mg/dL (ref 0.3–1.2)
Total Protein: 6.8 g/dL (ref 6.5–8.1)

## 2020-02-26 LAB — VITAMIN D 25 HYDROXY (VIT D DEFICIENCY, FRACTURES): Vit D, 25-Hydroxy: 95.67 ng/mL (ref 30–100)

## 2020-02-26 MED ORDER — DIPHENHYDRAMINE HCL 25 MG PO CAPS
50.0000 mg | ORAL_CAPSULE | Freq: Once | ORAL | Status: AC
  Administered 2020-02-26: 50 mg via ORAL

## 2020-02-26 MED ORDER — DIPHENHYDRAMINE HCL 25 MG PO CAPS
ORAL_CAPSULE | ORAL | Status: AC
  Filled 2020-02-26: qty 2

## 2020-02-26 MED ORDER — SODIUM CHLORIDE 0.9% FLUSH
10.0000 mL | INTRAVENOUS | Status: DC | PRN
  Administered 2020-02-26: 10 mL
  Filled 2020-02-26: qty 10

## 2020-02-26 MED ORDER — HEPARIN SOD (PORK) LOCK FLUSH 100 UNIT/ML IV SOLN
500.0000 [IU] | Freq: Once | INTRAVENOUS | Status: AC | PRN
  Administered 2020-02-26: 500 [IU]
  Filled 2020-02-26: qty 5

## 2020-02-26 MED ORDER — ACETAMINOPHEN 325 MG PO TABS
ORAL_TABLET | ORAL | Status: AC
  Filled 2020-02-26: qty 2

## 2020-02-26 MED ORDER — SODIUM CHLORIDE 0.9% FLUSH
10.0000 mL | Freq: Once | INTRAVENOUS | Status: AC | PRN
  Administered 2020-02-26: 10 mL
  Filled 2020-02-26: qty 10

## 2020-02-26 MED ORDER — SODIUM CHLORIDE 0.9 % IV SOLN
Freq: Once | INTRAVENOUS | Status: AC
  Filled 2020-02-26: qty 250

## 2020-02-26 MED ORDER — TRASTUZUMAB-DKST CHEMO 150 MG IV SOLR
6.0000 mg/kg | Freq: Once | INTRAVENOUS | Status: AC
  Administered 2020-02-26: 336 mg via INTRAVENOUS
  Filled 2020-02-26: qty 16

## 2020-02-26 MED ORDER — ALTEPLASE 2 MG IJ SOLR
2.0000 mg | Freq: Once | INTRAMUSCULAR | Status: DC | PRN
  Filled 2020-02-26: qty 2

## 2020-02-26 MED ORDER — ACETAMINOPHEN 325 MG PO TABS
650.0000 mg | ORAL_TABLET | Freq: Once | ORAL | Status: AC
  Administered 2020-02-26: 650 mg via ORAL

## 2020-02-26 MED ORDER — HEPARIN SOD (PORK) LOCK FLUSH 100 UNIT/ML IV SOLN
500.0000 [IU] | Freq: Once | INTRAVENOUS | Status: DC | PRN
  Filled 2020-02-26: qty 5

## 2020-02-26 MED ORDER — SODIUM CHLORIDE 0.9% FLUSH
10.0000 mL | INTRAVENOUS | Status: DC | PRN
  Filled 2020-02-26: qty 10

## 2020-02-26 NOTE — Patient Instructions (Signed)
[facility] Discharge Instructions for Patients Receiving Chemotherapy  Today you received the following chemotherapy agents trastuzumab.  To help prevent nausea and vomiting after your treatment, we encourage you to take your nausea medication as directed.    If you develop nausea and vomiting that is not controlled by your nausea medication, call the clinic.   BELOW ARE SYMPTOMS THAT SHOULD BE REPORTED IMMEDIATELY:  *FEVER GREATER THAN 100.5 F  *CHILLS WITH OR WITHOUT FEVER  NAUSEA AND VOMITING THAT IS NOT CONTROLLED WITH YOUR NAUSEA MEDICATION  *UNUSUAL SHORTNESS OF BREATH  *UNUSUAL BRUISING OR BLEEDING  TENDERNESS IN MOUTH AND THROAT WITH OR WITHOUT PRESENCE OF ULCERS  *URINARY PROBLEMS  *BOWEL PROBLEMS  UNUSUAL RASH Items with * indicate a potential emergency and should be followed up as soon as possible.  Feel free to call the clinic should you have any questions or concerns. The clinic phone number is [phone].  Please show the CHEMO ALERT CARD at check-in to the Emergency Department and triage nurse.   

## 2020-03-04 ENCOUNTER — Ambulatory Visit: Payer: Self-pay | Admitting: Surgery

## 2020-03-04 DIAGNOSIS — D369 Benign neoplasm, unspecified site: Secondary | ICD-10-CM

## 2020-03-04 DIAGNOSIS — C50912 Malignant neoplasm of unspecified site of left female breast: Secondary | ICD-10-CM | POA: Diagnosis not present

## 2020-03-04 DIAGNOSIS — D241 Benign neoplasm of right breast: Secondary | ICD-10-CM

## 2020-03-04 DIAGNOSIS — D249 Benign neoplasm of unspecified breast: Secondary | ICD-10-CM | POA: Diagnosis not present

## 2020-03-04 DIAGNOSIS — Z95828 Presence of other vascular implants and grafts: Secondary | ICD-10-CM | POA: Diagnosis not present

## 2020-03-04 NOTE — H&P (Signed)
Chelsea Hobbs Appointment: 03/04/2020 10:40 AM Location: [facility] Patient #: [id number] DOB: 02-Mar-1971 Married / Language: Chelsea Hobbs / Race: White Female  History of Present Illness Chelsea Hobbs A. Saim Almanza MD; 03/04/2020 12:46 PM) Patient words: Patient returns for follow-up of her left breast stage I cancer. She has completed radiation therapy and chemotherapy. She will continue with Herceptin until January 2022 she is doing well but has a papilloma in the right breast that was detected prior to chemotherapy that he needs to be removed and she desires her port to be removed prior to her deductible resetting. She will use a peripheral IV to finish out the Herceptin she states. Otherwise she is doing well.           Malignant neoplasm of upper-inner quadrant of left breast, Stage IA, p(T1cN0M0), ER+/PR+, HER2+, Grade III -She was diagnosed in 03/2019. She underwent left breast lumpectomy and SLNB on 04/04/19. Her DCIS margins were close (5m). -Given her 1.3cm tumor, node negative disease she completed adjuvant weekly Taxol and anti-Her2 Herceptin for 12 weeks 05/12/19-08/11/19. Taxol switched to Abraxane for last 2 doses due to skin rash. -She proceeded with Herceptin maintenance q3weeks on 09/01/19 to complete 1 year of treatment. -She completed Adjuvant Radiation with Dr. KSondra Come2/16/21-3/16/21. -Given her ER/PR positive disease and premenopausal status, I started her on Tamoxifen in 10/2019. Tolerating well with some anxiety and hot flashes.  The patient is a 49year old female.   Allergies (Chelsea Hobbs 03/04/2020 10:55 AM) Latex Exam Gloves *MEDICAL DEVICES AND SUPPLIES* Dermatitis. Doxycycline Hyclate *TETRACYCLINES* Diarrhea. Allergies Reconciled  Medication History (Chelsea Hobbs 03/04/2020 11:00 AM) Omeprazole (20MG Capsule DR, Oral) Active. Venlafaxine HCl ER (37.5MG Capsule ER 24HR, Oral) Active. Albuterol (90MCG/ACT Aerosol Soln, Inhalation)  Active. Allegra Allergy (180MG Tablet, Oral) Active. Flonase (50MCG/DOSE Inhaler, Nasal) Active. Probiotic + Omega-3 (Oral) Active. Singulair (10MG Tablet, Oral) Active. Tamoxifen Citrate (Oral) Specific strength unknown - Active. ALPRAZolam (1MG Tablet, Oral) Active. diazePAM (5MG Tablet, Oral) Active. Medications Reconciled    Vitals (Chelsea Hobbs; 03/04/2020 11:00 AM) 03/04/2020 11:00 AM Weight: 115.25 lb Height: 62in Body Surface Area: 1.51 m Body Mass Index: 21.08 kg/m  Temp.: 98.90F  Pulse: 75 (Regular)  P.OX: 99% (Room air) BP: 124/72(Sitting, Left Arm, Standard)        Physical Exam (Khamari Sheehan A. Shajuan Musso MD; 03/04/2020 12:47 PM)  General Mental Status-Alert. General Appearance-Consistent with stated age. Hydration-Well hydrated. Voice-Normal.  Breast Note: Left breast scar is intact. Right-sided Port-A-Cath intact. No masses bilaterally.  Neurologic Neurologic evaluation reveals -alert and oriented x 3 with no impairment of recent or remote memory. Mental Status-Normal.  Lymphatic Head & Neck  General Head & Neck Lymphatics: Bilateral - Description - Normal. Axillary  General Axillary Region: Bilateral - Description - Normal. Tenderness - Non Tender.    Assessment & Plan (Shandell Jallow A. Blessed Cotham MD; 03/04/2020 12:47 PM)  PAPILLOMA OF BREAST (D24.9) Impression: Patient will require right breast lumpectomy after she completes chemotherapy. She had no exertional reconstruction for plastic surgery after meeting with plastic surgeon   PORT-A-CATH IN PLACE [phone] Impression: wants port out  Current Plans Pt Education - [facility] Free Text Education/Instructions: discussed with patient and provided information. Pt Education - [facility] General Post-op HCI You are being scheduled for surgery- Our schedulers will call you.  You should hear from our office's scheduling department within 5 working days about the location, date, and  time of surgery. We try to make accommodations for patient's preferences in scheduling surgery, but  sometimes the OR schedule or the surgeon's schedule prevents [place] from making those accommodations.  If you have not heard from our office [phone]) in 5 working days, call the office and ask for your surgeon's nurse.  If you have other questions about your diagnosis, plan, or surgery, call the office and ask for your surgeon's nurse.  I recommended surgery to remove the catheter. I explained the technique of removal with use of local anesthesia & possible need for more aggressive sedation/anesthesia for patient comfort.  Risks such as bleeding, infection, and other risks were discussed. Post-operative dressing/incision care was discussed. I noted a good likelihood this will help address the problem. We will work to minimize complications. Questions were answered. The patient expresses understanding & wishes to proceed with surgery.   BREAST CANCER, LEFT (C50.912) Impression: Total time 20 minutes for face-to-face time, examination, documentation, reviewing medical records and test, and the EHR

## 2020-03-15 ENCOUNTER — Ambulatory Visit
Admission: RE | Admit: 2020-03-15 | Discharge: 2020-03-15 | Disposition: A | Discharge status: Home / Self Care | Payer: BC Managed Care – PPO | Source: Ambulatory Visit | Attending: Nurse Practitioner | Admitting: Nurse Practitioner

## 2020-03-15 ENCOUNTER — Other Ambulatory Visit: Payer: Self-pay

## 2020-03-15 DIAGNOSIS — R922 Inconclusive mammogram: Secondary | ICD-10-CM | POA: Diagnosis not present

## 2020-03-15 DIAGNOSIS — Z170 Estrogen receptor positive status [ER+]: Secondary | ICD-10-CM

## 2020-03-15 DIAGNOSIS — C50212 Malignant neoplasm of upper-inner quadrant of left female breast: Secondary | ICD-10-CM

## 2020-03-15 HISTORY — DX: Personal history of irradiation: Z92.3

## 2020-03-15 HISTORY — DX: Personal history of antineoplastic chemotherapy: Z92.21

## 2020-03-19 ENCOUNTER — Encounter: Payer: Self-pay | Admitting: *Deleted

## 2020-03-19 ENCOUNTER — Other Ambulatory Visit: Payer: Self-pay

## 2020-03-19 ENCOUNTER — Inpatient Hospital Stay: Payer: BC Managed Care – PPO

## 2020-03-19 ENCOUNTER — Inpatient Hospital Stay: Payer: BC Managed Care – PPO | Attending: Hematology

## 2020-03-19 VITALS — BP 126/72 | HR 75 | Temp 98.5°F | Resp 16

## 2020-03-19 DIAGNOSIS — C50212 Malignant neoplasm of upper-inner quadrant of left female breast: Secondary | ICD-10-CM

## 2020-03-19 DIAGNOSIS — Z79810 Long term (current) use of selective estrogen receptor modulators (SERMs): Secondary | ICD-10-CM | POA: Diagnosis not present

## 2020-03-19 DIAGNOSIS — Z9221 Personal history of antineoplastic chemotherapy: Secondary | ICD-10-CM | POA: Diagnosis not present

## 2020-03-19 DIAGNOSIS — Z923 Personal history of irradiation: Secondary | ICD-10-CM | POA: Diagnosis not present

## 2020-03-19 DIAGNOSIS — Z170 Estrogen receptor positive status [ER+]: Secondary | ICD-10-CM | POA: Diagnosis not present

## 2020-03-19 DIAGNOSIS — Z5112 Encounter for antineoplastic immunotherapy: Secondary | ICD-10-CM | POA: Diagnosis not present

## 2020-03-19 DIAGNOSIS — Z95828 Presence of other vascular implants and grafts: Secondary | ICD-10-CM

## 2020-03-19 LAB — CBC WITH DIFFERENTIAL (CANCER CENTER ONLY)
Abs Immature Granulocytes: 0.02 10*3/uL (ref 0.00–0.07)
Basophils Absolute: 0.1 10*3/uL (ref 0.0–0.1)
Basophils Relative: 1 %
Eosinophils Absolute: 0.2 10*3/uL (ref 0.0–0.5)
Eosinophils Relative: 2 %
HCT: 33.9 % — ABNORMAL LOW (ref 36.0–46.0)
Hemoglobin: 11.9 g/dL — ABNORMAL LOW (ref 12.0–15.0)
Immature Granulocytes: 0 %
Lymphocytes Relative: 31 %
Lymphs Abs: 2.5 10*3/uL (ref 0.7–4.0)
MCH: 31.3 pg (ref 26.0–34.0)
MCHC: 35.1 g/dL (ref 30.0–36.0)
MCV: 89.2 fL (ref 80.0–100.0)
Monocytes Absolute: 0.6 10*3/uL (ref 0.1–1.0)
Monocytes Relative: 7 %
Neutro Abs: 4.7 10*3/uL (ref 1.7–7.7)
Neutrophils Relative %: 59 %
Platelet Count: 264 10*3/uL (ref 150–400)
RBC: 3.8 MIL/uL — ABNORMAL LOW (ref 3.87–5.11)
RDW: 11.9 % (ref 11.5–15.5)
WBC Count: 8 10*3/uL (ref 4.0–10.5)
nRBC: 0 % (ref 0.0–0.2)

## 2020-03-19 LAB — CMP (CANCER CENTER ONLY)
ALT: 19 U/L (ref 0–44)
AST: 18 U/L (ref 15–41)
Albumin: 3.9 g/dL (ref 3.5–5.0)
Alkaline Phosphatase: 34 U/L — ABNORMAL LOW (ref 38–126)
Anion gap: 7 (ref 5–15)
BUN: 18 mg/dL (ref 6–20)
CO2: 26 mmol/L (ref 22–32)
Calcium: 8.8 mg/dL — ABNORMAL LOW (ref 8.9–10.3)
Chloride: 105 mmol/L (ref 98–111)
Creatinine: 0.7 mg/dL (ref 0.44–1.00)
GFR, Est AFR Am: >60 mL/min (ref >60)
GFR, Estimated: >60 mL/min (ref >60)
Glucose, Bld: 90 mg/dL (ref 70–99)
Potassium: 4 mmol/L (ref 3.5–5.1)
Sodium: 138 mmol/L (ref 135–145)
Total Bilirubin: 0.4 mg/dL (ref 0.3–1.2)
Total Protein: 7 g/dL (ref 6.5–8.1)

## 2020-03-19 MED ORDER — SODIUM CHLORIDE 0.9 % IV SOLN
Freq: Once | INTRAVENOUS | Status: AC
  Filled 2020-03-19: qty 250

## 2020-03-19 MED ORDER — DIPHENHYDRAMINE HCL 25 MG PO CAPS
50.0000 mg | ORAL_CAPSULE | Freq: Once | ORAL | Status: AC
  Administered 2020-03-19: 50 mg via ORAL

## 2020-03-19 MED ORDER — HEPARIN SOD (PORK) LOCK FLUSH 100 UNIT/ML IV SOLN
500.0000 [IU] | Freq: Once | INTRAVENOUS | Status: AC | PRN
  Administered 2020-03-19: 500 [IU]
  Filled 2020-03-19: qty 5

## 2020-03-19 MED ORDER — DIPHENHYDRAMINE HCL 25 MG PO CAPS
ORAL_CAPSULE | ORAL | Status: AC
  Filled 2020-03-19: qty 2

## 2020-03-19 MED ORDER — ACETAMINOPHEN 325 MG PO TABS
650.0000 mg | ORAL_TABLET | Freq: Once | ORAL | Status: AC
  Administered 2020-03-19: 650 mg via ORAL

## 2020-03-19 MED ORDER — ACETAMINOPHEN 325 MG PO TABS
ORAL_TABLET | ORAL | Status: AC
  Filled 2020-03-19: qty 2

## 2020-03-19 MED ORDER — SODIUM CHLORIDE 0.9% FLUSH
10.0000 mL | INTRAVENOUS | Status: DC | PRN
  Administered 2020-03-19: 10 mL
  Filled 2020-03-19: qty 10

## 2020-03-19 MED ORDER — TRASTUZUMAB-DKST CHEMO 150 MG IV SOLR
6.0000 mg/kg | Freq: Once | INTRAVENOUS | Status: AC
  Administered 2020-03-19: 336 mg via INTRAVENOUS
  Filled 2020-03-19: qty 16

## 2020-03-19 NOTE — Patient Instructions (Signed)
[facility] Discharge Instructions for Patients Receiving Chemotherapy  Today you received the following chemotherapy agents trastuzumab.  To help prevent nausea and vomiting after your treatment, we encourage you to take your nausea medication as directed.    If you develop nausea and vomiting that is not controlled by your nausea medication, call the clinic.   BELOW ARE SYMPTOMS THAT SHOULD BE REPORTED IMMEDIATELY:  *FEVER GREATER THAN 100.5 F  *CHILLS WITH OR WITHOUT FEVER  NAUSEA AND VOMITING THAT IS NOT CONTROLLED WITH YOUR NAUSEA MEDICATION  *UNUSUAL SHORTNESS OF BREATH  *UNUSUAL BRUISING OR BLEEDING  TENDERNESS IN MOUTH AND THROAT WITH OR WITHOUT PRESENCE OF ULCERS  *URINARY PROBLEMS  *BOWEL PROBLEMS  UNUSUAL RASH Items with * indicate a potential emergency and should be followed up as soon as possible.  Feel free to call the clinic should you have any questions or concerns. The clinic phone number is [phone].  Please show the CHEMO ALERT CARD at check-in to the Emergency Department and triage nurse.   

## 2020-03-26 ENCOUNTER — Other Ambulatory Visit: Payer: Self-pay | Admitting: Surgery

## 2020-03-26 DIAGNOSIS — D369 Benign neoplasm, unspecified site: Secondary | ICD-10-CM

## 2020-04-08 ENCOUNTER — Inpatient Hospital Stay: Payer: BC Managed Care – PPO

## 2020-04-08 ENCOUNTER — Other Ambulatory Visit: Payer: Self-pay

## 2020-04-08 ENCOUNTER — Other Ambulatory Visit: Payer: Self-pay | Admitting: Hematology

## 2020-04-08 VITALS — BP 116/79 | HR 85 | Temp 98.6°F | Resp 16

## 2020-04-08 DIAGNOSIS — Z79810 Long term (current) use of selective estrogen receptor modulators (SERMs): Secondary | ICD-10-CM | POA: Diagnosis not present

## 2020-04-08 DIAGNOSIS — Z170 Estrogen receptor positive status [ER+]: Secondary | ICD-10-CM

## 2020-04-08 DIAGNOSIS — C50212 Malignant neoplasm of upper-inner quadrant of left female breast: Secondary | ICD-10-CM | POA: Diagnosis not present

## 2020-04-08 DIAGNOSIS — Z5112 Encounter for antineoplastic immunotherapy: Secondary | ICD-10-CM | POA: Diagnosis not present

## 2020-04-08 DIAGNOSIS — Z9221 Personal history of antineoplastic chemotherapy: Secondary | ICD-10-CM | POA: Diagnosis not present

## 2020-04-08 DIAGNOSIS — Z923 Personal history of irradiation: Secondary | ICD-10-CM | POA: Diagnosis not present

## 2020-04-08 MED ORDER — SODIUM CHLORIDE 0.9% FLUSH
10.0000 mL | INTRAVENOUS | Status: DC | PRN
  Administered 2020-04-08: 10 mL
  Filled 2020-04-08: qty 10

## 2020-04-08 MED ORDER — ACETAMINOPHEN 325 MG PO TABS
ORAL_TABLET | ORAL | Status: AC
  Filled 2020-04-08: qty 2

## 2020-04-08 MED ORDER — DIPHENHYDRAMINE HCL 25 MG PO CAPS
ORAL_CAPSULE | ORAL | Status: AC
  Filled 2020-04-08: qty 2

## 2020-04-08 MED ORDER — HEPARIN SOD (PORK) LOCK FLUSH 100 UNIT/ML IV SOLN
500.0000 [IU] | Freq: Once | INTRAVENOUS | Status: AC | PRN
  Administered 2020-04-08: 500 [IU]
  Filled 2020-04-08: qty 5

## 2020-04-08 MED ORDER — ACETAMINOPHEN 325 MG PO TABS
650.0000 mg | ORAL_TABLET | Freq: Once | ORAL | Status: AC
  Administered 2020-04-08: 650 mg via ORAL

## 2020-04-08 MED ORDER — DIPHENHYDRAMINE HCL 25 MG PO CAPS
50.0000 mg | ORAL_CAPSULE | Freq: Once | ORAL | Status: AC
  Administered 2020-04-08: 50 mg via ORAL

## 2020-04-08 MED ORDER — SODIUM CHLORIDE 0.9 % IV SOLN
Freq: Once | INTRAVENOUS | Status: AC
  Filled 2020-04-08: qty 250

## 2020-04-08 MED ORDER — TAMOXIFEN CITRATE 20 MG PO TABS: 20.0000 mg | ORAL_TABLET | Freq: Every day | ORAL | 1 refills | Status: DC

## 2020-04-08 MED ORDER — TRASTUZUMAB-DKST CHEMO 150 MG IV SOLR
6.0000 mg/kg | Freq: Once | INTRAVENOUS | Status: AC
  Administered 2020-04-08: 336 mg via INTRAVENOUS
  Filled 2020-04-08: qty 16

## 2020-04-08 NOTE — Progress Notes (Signed)
Chelsea Hobbs called stating she just filled her tamoxifen and has lost it.  She is requesting refills.

## 2020-04-08 NOTE — Patient Instructions (Signed)
[facility] Discharge Instructions for Patients Receiving Chemotherapy  Today you received the following chemotherapy agents trastuzumab.  To help prevent nausea and vomiting after your treatment, we encourage you to take your nausea medication as directed.    If you develop nausea and vomiting that is not controlled by your nausea medication, call the clinic.   BELOW ARE SYMPTOMS THAT SHOULD BE REPORTED IMMEDIATELY:  *FEVER GREATER THAN 100.5 F  *CHILLS WITH OR WITHOUT FEVER  NAUSEA AND VOMITING THAT IS NOT CONTROLLED WITH YOUR NAUSEA MEDICATION  *UNUSUAL SHORTNESS OF BREATH  *UNUSUAL BRUISING OR BLEEDING  TENDERNESS IN MOUTH AND THROAT WITH OR WITHOUT PRESENCE OF ULCERS  *URINARY PROBLEMS  *BOWEL PROBLEMS  UNUSUAL RASH Items with * indicate a potential emergency and should be followed up as soon as possible.  Feel free to call the clinic should you have any questions or concerns. The clinic phone number is [phone].  Please show the CHEMO ALERT CARD at check-in to the Emergency Department and triage nurse.   

## 2020-04-10 ENCOUNTER — Encounter (HOSPITAL_BASED_OUTPATIENT_CLINIC_OR_DEPARTMENT_OTHER): Payer: Self-pay | Admitting: Surgery

## 2020-04-10 ENCOUNTER — Other Ambulatory Visit: Payer: Self-pay

## 2020-04-10 DIAGNOSIS — Z20822 Contact with and (suspected) exposure to covid-19: Secondary | ICD-10-CM | POA: Diagnosis not present

## 2020-04-13 ENCOUNTER — Other Ambulatory Visit (HOSPITAL_COMMUNITY)
Admission: RE | Admit: 2020-04-13 | Discharge: 2020-04-13 | Disposition: A | Discharge status: Home / Self Care | Payer: BC Managed Care – PPO | Source: Ambulatory Visit | Attending: Surgery | Admitting: Surgery

## 2020-04-13 DIAGNOSIS — Z01812 Encounter for preprocedural laboratory examination: Secondary | ICD-10-CM | POA: Insufficient documentation

## 2020-04-13 DIAGNOSIS — Z20822 Contact with and (suspected) exposure to covid-19: Secondary | ICD-10-CM | POA: Insufficient documentation

## 2020-04-13 LAB — SARS CORONAVIRUS 2 (TAT 6-24 HRS): SARS Coronavirus 2: NEGATIVE

## 2020-04-15 DIAGNOSIS — K5150 Left sided colitis without complications: Secondary | ICD-10-CM | POA: Diagnosis not present

## 2020-04-15 DIAGNOSIS — K648 Other hemorrhoids: Secondary | ICD-10-CM | POA: Diagnosis not present

## 2020-04-15 DIAGNOSIS — K5289 Other specified noninfective gastroenteritis and colitis: Secondary | ICD-10-CM | POA: Diagnosis not present

## 2020-04-16 ENCOUNTER — Other Ambulatory Visit: Payer: Self-pay

## 2020-04-16 ENCOUNTER — Ambulatory Visit
Admission: RE | Admit: 2020-04-16 | Discharge: 2020-04-16 | Disposition: A | Discharge status: Home / Self Care | Payer: BC Managed Care – PPO | Source: Ambulatory Visit | Attending: Surgery | Admitting: Surgery

## 2020-04-16 DIAGNOSIS — R928 Other abnormal and inconclusive findings on diagnostic imaging of breast: Secondary | ICD-10-CM | POA: Diagnosis not present

## 2020-04-16 NOTE — Progress Notes (Signed)

## 2020-04-17 ENCOUNTER — Encounter (HOSPITAL_BASED_OUTPATIENT_CLINIC_OR_DEPARTMENT_OTHER)
Admission: RE | Disposition: A | Discharge status: Home / Self Care | Payer: Self-pay | Source: Home / Self Care | Attending: Surgery

## 2020-04-17 ENCOUNTER — Ambulatory Visit (HOSPITAL_BASED_OUTPATIENT_CLINIC_OR_DEPARTMENT_OTHER)
Admission: RE | Admit: 2020-04-17 | Discharge: 2020-04-17 | Disposition: A | Discharge status: Home / Self Care | Payer: BC Managed Care – PPO | Attending: Surgery | Admitting: Surgery

## 2020-04-17 ENCOUNTER — Ambulatory Visit (HOSPITAL_BASED_OUTPATIENT_CLINIC_OR_DEPARTMENT_OTHER): Payer: BC Managed Care – PPO | Admitting: Anesthesiology

## 2020-04-17 ENCOUNTER — Encounter (HOSPITAL_BASED_OUTPATIENT_CLINIC_OR_DEPARTMENT_OTHER): Payer: Self-pay | Admitting: Surgery

## 2020-04-17 ENCOUNTER — Ambulatory Visit
Admission: RE | Admit: 2020-04-17 | Discharge: 2020-04-17 | Disposition: A | Discharge status: Home / Self Care | Payer: BC Managed Care – PPO | Source: Ambulatory Visit | Attending: Surgery | Admitting: Surgery

## 2020-04-17 ENCOUNTER — Other Ambulatory Visit: Payer: Self-pay

## 2020-04-17 DIAGNOSIS — Z91040 Latex allergy status: Secondary | ICD-10-CM | POA: Diagnosis not present

## 2020-04-17 DIAGNOSIS — Z170 Estrogen receptor positive status [ER+]: Secondary | ICD-10-CM | POA: Diagnosis not present

## 2020-04-17 DIAGNOSIS — Z452 Encounter for adjustment and management of vascular access device: Secondary | ICD-10-CM | POA: Insufficient documentation

## 2020-04-17 DIAGNOSIS — F419 Anxiety disorder, unspecified: Secondary | ICD-10-CM | POA: Diagnosis not present

## 2020-04-17 DIAGNOSIS — C50212 Malignant neoplasm of upper-inner quadrant of left female breast: Secondary | ICD-10-CM | POA: Insufficient documentation

## 2020-04-17 DIAGNOSIS — D241 Benign neoplasm of right breast: Secondary | ICD-10-CM

## 2020-04-17 DIAGNOSIS — Z881 Allergy status to other antibiotic agents status: Secondary | ICD-10-CM | POA: Diagnosis not present

## 2020-04-17 DIAGNOSIS — Z923 Personal history of irradiation: Secondary | ICD-10-CM | POA: Diagnosis not present

## 2020-04-17 DIAGNOSIS — J45909 Unspecified asthma, uncomplicated: Secondary | ICD-10-CM | POA: Insufficient documentation

## 2020-04-17 DIAGNOSIS — Z9221 Personal history of antineoplastic chemotherapy: Secondary | ICD-10-CM | POA: Diagnosis not present

## 2020-04-17 DIAGNOSIS — N6081 Other benign mammary dysplasias of right breast: Secondary | ICD-10-CM | POA: Diagnosis not present

## 2020-04-17 DIAGNOSIS — Z79810 Long term (current) use of selective estrogen receptor modulators (SERMs): Secondary | ICD-10-CM | POA: Insufficient documentation

## 2020-04-17 DIAGNOSIS — N6011 Diffuse cystic mastopathy of right breast: Secondary | ICD-10-CM | POA: Diagnosis not present

## 2020-04-17 DIAGNOSIS — Z79899 Other long term (current) drug therapy: Secondary | ICD-10-CM | POA: Insufficient documentation

## 2020-04-17 DIAGNOSIS — D369 Benign neoplasm, unspecified site: Secondary | ICD-10-CM

## 2020-04-17 HISTORY — PX: BREAST LUMPECTOMY WITH RADIOACTIVE SEED LOCALIZATION: SHX6424

## 2020-04-17 HISTORY — PX: PORT-A-CATH REMOVAL: SHX5289

## 2020-04-17 LAB — POCT PREGNANCY, URINE: Preg Test, Ur: NEGATIVE

## 2020-04-17 SURGERY — BREAST LUMPECTOMY WITH RADIOACTIVE SEED LOCALIZATION
Anesthesia: General | Site: Chest | Laterality: Right

## 2020-04-17 MED ORDER — LIDOCAINE 2% (20 MG/ML) 5 ML SYRINGE
INTRAMUSCULAR | Status: DC | PRN
  Administered 2020-04-17: 60 mg via INTRAVENOUS

## 2020-04-17 MED ORDER — BUPIVACAINE HCL (PF) 0.25 % IJ SOLN
INTRAMUSCULAR | Status: DC | PRN
  Administered 2020-04-17: 20 mL

## 2020-04-17 MED ORDER — ACETAMINOPHEN 500 MG PO TABS
ORAL_TABLET | ORAL | Status: AC
  Filled 2020-04-17: qty 2

## 2020-04-17 MED ORDER — OXYCODONE HCL 5 MG PO TABS: 5.0000 mg | ORAL_TABLET | Freq: Once | ORAL | Status: DC | PRN

## 2020-04-17 MED ORDER — GABAPENTIN 300 MG PO CAPS
ORAL_CAPSULE | ORAL | Status: AC
  Filled 2020-04-17: qty 1

## 2020-04-17 MED ORDER — PROPOFOL 10 MG/ML IV BOLUS
INTRAVENOUS | Status: AC
  Filled 2020-04-17: qty 20

## 2020-04-17 MED ORDER — HYDROCODONE-ACETAMINOPHEN 5-325 MG PO TABS: 1.0000 | ORAL_TABLET | Freq: Four times a day (QID) | ORAL | 0 refills | Status: DC | PRN

## 2020-04-17 MED ORDER — FENTANYL CITRATE (PF) 100 MCG/2ML IJ SOLN
INTRAMUSCULAR | Status: AC
  Filled 2020-04-17: qty 2

## 2020-04-17 MED ORDER — ACETAMINOPHEN 500 MG PO TABS
1000.0000 mg | ORAL_TABLET | ORAL | Status: AC
  Administered 2020-04-17: 1000 mg via ORAL

## 2020-04-17 MED ORDER — CHLORHEXIDINE GLUCONATE CLOTH 2 % EX PADS: 6.0000 | MEDICATED_PAD | Freq: Once | CUTANEOUS | Status: DC

## 2020-04-17 MED ORDER — PROMETHAZINE HCL 25 MG/ML IJ SOLN: 6.2500 mg | INTRAMUSCULAR | Status: DC | PRN

## 2020-04-17 MED ORDER — CEFAZOLIN SODIUM-DEXTROSE 2-4 GM/100ML-% IV SOLN
2.0000 g | INTRAVENOUS | Status: AC
  Administered 2020-04-17: 2 g via INTRAVENOUS

## 2020-04-17 MED ORDER — MIDAZOLAM HCL 2 MG/2ML IJ SOLN
INTRAMUSCULAR | Status: AC
  Filled 2020-04-17: qty 2

## 2020-04-17 MED ORDER — CEFAZOLIN SODIUM-DEXTROSE 2-4 GM/100ML-% IV SOLN
INTRAVENOUS | Status: AC
  Filled 2020-04-17: qty 100

## 2020-04-17 MED ORDER — PROPOFOL 10 MG/ML IV BOLUS
INTRAVENOUS | Status: DC | PRN
  Administered 2020-04-17: 130 mg via INTRAVENOUS

## 2020-04-17 MED ORDER — CELECOXIB 200 MG PO CAPS
ORAL_CAPSULE | ORAL | Status: AC
  Filled 2020-04-17: qty 1

## 2020-04-17 MED ORDER — DEXAMETHASONE SODIUM PHOSPHATE 4 MG/ML IJ SOLN
INTRAMUSCULAR | Status: DC | PRN
  Administered 2020-04-17: 4 mg via INTRAVENOUS

## 2020-04-17 MED ORDER — DEXAMETHASONE SODIUM PHOSPHATE 10 MG/ML IJ SOLN
INTRAMUSCULAR | Status: AC
  Filled 2020-04-17: qty 1

## 2020-04-17 MED ORDER — FENTANYL CITRATE (PF) 100 MCG/2ML IJ SOLN
INTRAMUSCULAR | Status: DC | PRN
  Administered 2020-04-17: 50 ug via INTRAVENOUS

## 2020-04-17 MED ORDER — OXYCODONE HCL 5 MG/5ML PO SOLN: 5.0000 mg | Freq: Once | ORAL | Status: DC | PRN

## 2020-04-17 MED ORDER — ONDANSETRON HCL 4 MG/2ML IJ SOLN
INTRAMUSCULAR | Status: AC
  Filled 2020-04-17: qty 2

## 2020-04-17 MED ORDER — CELECOXIB 200 MG PO CAPS
200.0000 mg | ORAL_CAPSULE | ORAL | Status: AC
  Administered 2020-04-17: 200 mg via ORAL

## 2020-04-17 MED ORDER — FENTANYL CITRATE (PF) 100 MCG/2ML IJ SOLN: 25.0000 ug | INTRAMUSCULAR | Status: DC | PRN

## 2020-04-17 MED ORDER — IBUPROFEN 800 MG PO TABS: 800.0000 mg | ORAL_TABLET | Freq: Three times a day (TID) | ORAL | 0 refills | Status: DC | PRN

## 2020-04-17 MED ORDER — LACTATED RINGERS IV SOLN: INTRAVENOUS | Status: DC

## 2020-04-17 MED ORDER — GABAPENTIN 300 MG PO CAPS
300.0000 mg | ORAL_CAPSULE | ORAL | Status: AC
  Administered 2020-04-17: 300 mg via ORAL

## 2020-04-17 MED ORDER — LIDOCAINE 2% (20 MG/ML) 5 ML SYRINGE
INTRAMUSCULAR | Status: AC
  Filled 2020-04-17: qty 5

## 2020-04-17 MED ORDER — MIDAZOLAM HCL 5 MG/5ML IJ SOLN
INTRAMUSCULAR | Status: DC | PRN
  Administered 2020-04-17: 2 mg via INTRAVENOUS

## 2020-04-17 SURGICAL SUPPLY — 57 items
ADH SKN CLS APL DERMABOND .7 (GAUZE/BANDAGES/DRESSINGS) ×2
APL PRP STRL LF DISP 70% ISPRP (MISCELLANEOUS) ×2
APL SKNCLS STERI-STRIP NONHPOA (GAUZE/BANDAGES/DRESSINGS)
APPLIER CLIP 9.375 MED OPEN (MISCELLANEOUS)
APR CLP MED 9.3 20 MLT OPN (MISCELLANEOUS)
BENZOIN TINCTURE PRP APPL 2/3 (GAUZE/BANDAGES/DRESSINGS) IMPLANT
BINDER BREAST LRG (GAUZE/BANDAGES/DRESSINGS) IMPLANT
BINDER BREAST MEDIUM (GAUZE/BANDAGES/DRESSINGS) ×3 IMPLANT
BINDER BREAST XLRG (GAUZE/BANDAGES/DRESSINGS) IMPLANT
BINDER BREAST XXLRG (GAUZE/BANDAGES/DRESSINGS) IMPLANT
BLADE SURG 15 STRL LF DISP TIS (BLADE) ×2 IMPLANT
BLADE SURG 15 STRL SS (BLADE) ×3
CANISTER SUC SOCK COL 7IN (MISCELLANEOUS) IMPLANT
CANISTER SUCT 1200ML W/VALVE (MISCELLANEOUS) IMPLANT
CHLORAPREP W/TINT 26 (MISCELLANEOUS) ×3 IMPLANT
CLIP APPLIE 9.375 MED OPEN (MISCELLANEOUS) IMPLANT
COVER BACK TABLE 60X90IN (DRAPES) ×3 IMPLANT
COVER MAYO STAND STRL (DRAPES) ×3 IMPLANT
COVER PROBE W GEL 5X96 (DRAPES) ×3 IMPLANT
COVER WAND RF STERILE (DRAPES) IMPLANT
DECANTER SPIKE VIAL GLASS SM (MISCELLANEOUS) ×3 IMPLANT
DERMABOND ADVANCED (GAUZE/BANDAGES/DRESSINGS) ×1
DERMABOND ADVANCED .7 DNX12 (GAUZE/BANDAGES/DRESSINGS) ×2 IMPLANT
DRAPE LAPAROSCOPIC ABDOMINAL (DRAPES) IMPLANT
DRAPE LAPAROTOMY 100X72 PEDS (DRAPES) ×3 IMPLANT
DRAPE UTILITY XL STRL (DRAPES) ×3 IMPLANT
ELECT COATED BLADE 2.86 ST (ELECTRODE) ×3 IMPLANT
ELECT REM PT RETURN 9FT ADLT (ELECTROSURGICAL) ×3
ELECTRODE REM PT RTRN 9FT ADLT (ELECTROSURGICAL) ×2 IMPLANT
GLOVE BIOGEL PI IND STRL 8 (GLOVE) ×2 IMPLANT
GLOVE BIOGEL PI INDICATOR 8 (GLOVE) ×1
GLOVE ECLIPSE 8.0 STRL XLNG CF (GLOVE) IMPLANT
GLOVE SURG SYN 7.0 (GLOVE) ×3 IMPLANT
GLOVE SURG SYN 8.0 (GLOVE) ×3 IMPLANT
GOWN STRL REUS W/ TWL LRG LVL3 (GOWN DISPOSABLE) ×2 IMPLANT
GOWN STRL REUS W/ TWL XL LVL3 (GOWN DISPOSABLE) ×2 IMPLANT
GOWN STRL REUS W/TWL LRG LVL3 (GOWN DISPOSABLE) ×3
GOWN STRL REUS W/TWL XL LVL3 (GOWN DISPOSABLE) ×3
HEMOSTAT ARISTA ABSORB 3G PWDR (HEMOSTASIS) IMPLANT
HEMOSTAT SNOW SURGICEL 2X4 (HEMOSTASIS) IMPLANT
KIT MARKER MARGIN INK (KITS) ×3 IMPLANT
NEEDLE HYPO 25X1 1.5 SAFETY (NEEDLE) ×3 IMPLANT
NS IRRIG 1000ML POUR BTL (IV SOLUTION) IMPLANT
PACK BASIN DAY SURGERY FS (CUSTOM PROCEDURE TRAY) ×3 IMPLANT
PENCIL SMOKE EVACUATOR (MISCELLANEOUS) ×3 IMPLANT
SLEEVE SCD COMPRESS KNEE MED (MISCELLANEOUS) ×3 IMPLANT
SPONGE LAP 4X18 RFD (DISPOSABLE) ×3 IMPLANT
STRIP CLOSURE SKIN 1/2X4 (GAUZE/BANDAGES/DRESSINGS) IMPLANT
SUT MNCRL AB 4-0 PS2 18 (SUTURE) ×6 IMPLANT
SUT MON AB 4-0 PC3 18 (SUTURE) ×3 IMPLANT
SUT SILK 2 0 SH (SUTURE) IMPLANT
SUT VICRYL 3-0 CR8 SH (SUTURE) ×6 IMPLANT
SYR CONTROL 10ML LL (SYRINGE) ×3 IMPLANT
TOWEL GREEN STERILE FF (TOWEL DISPOSABLE) ×3 IMPLANT
TRAY FAXITRON CT DISP (TRAY / TRAY PROCEDURE) ×3 IMPLANT
TUBE CONNECTING 20X1/4 (TUBING) ×3 IMPLANT
YANKAUER SUCT BULB TIP NO VENT (SUCTIONS) ×3 IMPLANT

## 2020-04-17 NOTE — Interval H&P Note (Signed)
History and Physical Interval Note:  04/17/2020 10:47 AM  Chelsea Hobbs  has presented today for surgery, with the diagnosis of breast ca.  The various methods of treatment have been discussed with the patient and family. After consideration of risks, benefits and other options for treatment, the patient has consented to  Procedure(s): RIGHT BREAST LUMPECTOMY WITH RADIOACTIVE SEED LOCALIZATION (Right) REMOVAL PORT-A-CATH (N/A) as a surgical intervention.  The patient's history has been reviewed, patient examined, no change in status, stable for surgery.  I have reviewed the patient's chart and labs.  Questions were answered to the patient's satisfaction.     Turner Daniels MD

## 2020-04-17 NOTE — Anesthesia Preprocedure Evaluation (Addendum)
Anesthesia Evaluation  Patient identified by MRN, date of birth, ID band Patient awake    Reviewed: Allergy & Precautions, NPO status , Patient's Chart, lab work & pertinent test results  History of Anesthesia Complications Negative for: history of anesthetic complications  Airway Mallampati: II  TM Distance: >3 FB Neck ROM: Full    Dental  (+) Dental Advisory Given, Teeth Intact   Pulmonary asthma ,    Pulmonary exam normal        Cardiovascular negative cardio ROS Normal cardiovascular exam     Neuro/Psych negative neurological ROS  negative psych ROS   GI/Hepatic negative GI ROS, Neg liver ROS,   Endo/Other  negative endocrine ROS  Renal/GU negative Renal ROS     Musculoskeletal negative musculoskeletal ROS (+)   Abdominal   Peds  Hematology negative hematology ROS (+)   Anesthesia Other Findings Covid test negative   Reproductive/Obstetrics  Breast cancer                            Anesthesia Physical Anesthesia Plan  ASA: II  Anesthesia Plan: General   Post-op Pain Management:    Induction: Intravenous  PONV Risk Score and Plan: 3 and Treatment may vary due to age or medical condition, Ondansetron, Dexamethasone and Midazolam  Airway Management Planned: LMA  Additional Equipment: None  Intra-op Plan:   Post-operative Plan: Extubation in OR  Informed Consent: I have reviewed the patients History and Physical, chart, labs and discussed the procedure including the risks, benefits and alternatives for the proposed anesthesia with the patient or authorized representative who has indicated his/her understanding and acceptance.     Dental advisory given  Plan Discussed with: CRNA and Anesthesiologist  Anesthesia Plan Comments:        Anesthesia Quick Evaluation

## 2020-04-17 NOTE — Op Note (Addendum)
Preoperative diagnosis: right  breast papilloma  Postoperative diagnosis: Same   Procedure: right  breast seed localized lumpectomy port removal   Surgeon: Erroll Luna M.D.  Anesthesia: Gen. With 0.25% Sensorcaine local  EBL: 20 cc  Specimen: right  breast tissue with clip and radioactive seed in the specimen. Verified with neoprobe and radiographic image showing both seed and clip in specimen  Indications for procedure: The patient presents for right  breast excisional lumpectomy after core biopsy showed papilloma. Discussed the rationale for considering excision. Small risk of malignancy associated with papilloma lesion after core biopsy. Discussed observation. Discussed wire localization. Patient desired excision of right  breast papilloma.The procedure has been discussed with the patient. Alternatives to surgery have been discussed with the patient.  Risks of surgery include bleeding,  Infection,  Seroma formation, death,  and the need for further surgery.   The patient understands and wishes to proceed.   Description of procedure: Patient underwent seed placement as an outpatient. Patient presents today for right  breast seed localized lumpectomy. Patient and holding area. Questions are answered and neoprobe used to verify seed location. Patient taken back to the operating room and placed upon the OR table. After induction of general anesthesia, left breast prepped and draped in a sterile fashion. Timeout was done to verify proper sizing procedure. Neoprobe used and hot spot identified and right  breast upper-outer quadrant. This was marked with pen. Curvilinear incision made right  Periareolar breast. Dissection used with the help of a neoprobe around the tissue where the seed and clip were located. Tissue removed in its entirety with gross margins.  Neoprobe used and seed  within specimen. Radiographs taken which show clip and seed in the  specimen.hemostasis achieved and cavity closed  with 3-0 Vicryl and 4-0 Monocryl. Dermabond applied. Incision made over right subclavian port site.  Port identified and suture cut holding it in place. Port removed and tract closed with 3 0 vicryl. 4-0 monocryl.  Dermabond applied.   All final counts found to be correct. Specimen transported to pathology. Patient awoke extubated taken to recovery in satisfactory condition.

## 2020-04-17 NOTE — H&P (Signed)
Chelsea Hobbs  Location: [facility] Patient #: [id number] DOB: 1970-12-08 Married / Language: English / Race: White Female  History of Present Illness  PM) Patient words: Patient returns for follow-up of her left breast stage I cancer. She has completed radiation therapy and chemotherapy. She will continue with Herceptin until January 2022 she is doing well but has a papilloma in the right breast that was detected prior to chemotherapy that he needs to be removed and she desires her port to be removed prior to her deductible resetting. She will use a peripheral IV to finish out the Herceptin she states. Otherwise she is doing well.           Malignant neoplasm of upper-inner quadrant of left breast, Stage IA, p(T1cN0M0), ER+/PR+, HER2+, Grade III -She was diagnosed in 03/2019. She underwent left breast lumpectomy and SLNB on 04/04/19. Her DCIS margins were close (62m). -Given her 1.3cm tumor, node negative disease she completed adjuvant weekly Taxol and anti-Her2 Herceptin for 12 weeks 05/12/19-08/11/19. Taxol switched to Abraxane for last 2 doses due to skin rash. -She proceeded with Herceptin maintenance q3weeks on 09/01/19 to complete 1 year of treatment. -She completed Adjuvant Radiation with Dr. KSondra Come2/16/21-3/16/21. -Given her ER/PR positive disease and premenopausal status, I started her on Tamoxifen in 10/2019. Tolerating well with some anxiety and hot flashes.  The patient is a 49year old female.   Allergies Latex Exam Gloves *MEDICAL DEVICES AND SUPPLIES* Dermatitis. Doxycycline Hyclate *TETRACYCLINES* Diarrhea. Allergies Reconciled  Medication History ([name] 03/04/2020 11:00 AM) Omeprazole (20MG Capsule DR, Oral) Active. Venlafaxine HCl ER (37.5MG Capsule ER 24HR, Oral) Active. Albuterol (90MCG/ACT Aerosol Soln, Inhalation) Active. Allegra Allergy (180MG Tablet, Oral) Active. Flonase (50MCG/DOSE Inhaler, Nasal)  Active. Probiotic + Omega-3 (Oral) Active. Singulair (10MG Tablet, Oral) Active. Tamoxifen Citrate (Oral) Specific strength unknown - Active. ALPRAZolam (1MG Tablet, Oral) Active. diazePAM (5MG Tablet, Oral) Active. Medications Reconciled    Vitals  03/04/2020 11:00 AM Weight: 115.25 lb Height: 62in Body Surface Area: 1.51 m Body Mass Index: 21.08 kg/m  Temp.: 98.80F  Pulse: 75 (Regular)  P.OX: 99% (Room air) BP: 124/72(Sitting, Left Arm, Standard)        Physical Exam   General Mental Status-Alert. General Appearance-Consistent with stated age. Hydration-Well hydrated. Voice-Normal.  Breast Note: Left breast scar is intact. Right-sided Port-A-Cath intact. No masses bilaterally.  Neurologic Neurologic evaluation reveals -alert and oriented x 3 with no impairment of recent or remote memory. Mental Status-Normal.  Lymphatic Head & Neck  General Head & Neck Lymphatics: Bilateral - Description - Normal. Axillary  General Axillary Region: Bilateral - Description - Normal. Tenderness - Non Tender.    Assessment & Plan (Esaul Dorwart A. Kouper Spinella MD; 03/04/2020 12:47 PM)  PAPILLOMA OF BREAST (D24.9) Impression: Patient will require right breast lumpectomy after she completes chemotherapy. She had no exertional reconstruction for plastic surgery after meeting with plastic surgeon   PORT-A-CATH IN PLACE ([phone] Impression: wants port out  Current Plans Pt Education - [facility] Free Text Education/Instructions: discussed with patient and provided information. Pt Education - [facility] General Post-op HCI You are being scheduled for surgery- Our schedulers will call you.  You should hear from our office's scheduling department within 5 working days about the location, date, and time of surgery. We try to make accommodations for patient's preferences in scheduling surgery, but sometimes the OR schedule or the surgeon's schedule  prevents [place] making those accommodations.  If you have not heard from our office [phone] in  5 working days, call the office and ask for your surgeon's nurse.  If you have other questions about your diagnosis, plan, or surgery, call the office and ask for your surgeon's nurse.  I recommended surgery to remove the catheter. I explained the technique of removal with use of local anesthesia & possible need for more aggressive sedation/anesthesia for patient comfort.  Risks such as bleeding, infection, and other risks were discussed. Post-operative dressing/incision care was discussed. I noted a good likelihood this will help address the problem. We will work to minimize complications. Questions were answered. The patient expresses understanding & wishes to proceed with surgery.   BREAST CANCER, LEFT (C50.912) Impression: Total time 20 minutes for face-to-face time, examination, documentation, reviewing medical records and test, and the EHR

## 2020-04-17 NOTE — Anesthesia Procedure Notes (Signed)
Procedure Name: LMA Insertion Date/Time: 04/17/2020 11:05 AM Performed by: Maryella Shivers, CRNA Pre-anesthesia Checklist: Patient identified, Emergency Drugs available, Suction available and Patient being monitored Patient Re-evaluated:Patient Re-evaluated prior to induction Oxygen Delivery Method: Circle system utilized Preoxygenation: Pre-oxygenation with 100% oxygen Induction Type: IV induction Ventilation: Mask ventilation without difficulty LMA: LMA inserted LMA Size: 4.0 Number of attempts: 1 Placement Confirmation: positive ETCO2 and breath sounds checked- equal and bilateral Tube secured with: Tape Dental Injury: Teeth and Oropharynx as per pre-operative assessment

## 2020-04-17 NOTE — Discharge Instructions (Signed)
[facility] Office Phone Number [phone]  BREAST BIOPSY/ PARTIAL MASTECTOMY: POST OP INSTRUCTIONS  Always review your discharge instruction sheet given to you by the facility where your surgery was performed.  IF YOU HAVE DISABILITY OR FAMILY LEAVE FORMS, YOU MUST BRING THEM TO THE OFFICE FOR PROCESSING.  DO NOT GIVE THEM TO YOUR DOCTOR.  1. A prescription for pain medication may be given to you upon discharge.  Take your pain medication as prescribed, if needed.  If narcotic pain medicine is not needed, then you may take acetaminophen (Tylenol) or ibuprofen (Advil) as needed. No Tylenol or Ibuprofen until 4:00pm if needed. 2. Take your usually prescribed medications unless otherwise directed 3. If you need a refill on your pain medication, please contact your pharmacy.  They will contact our office to request authorization.  Prescriptions will not be filled after 5pm or on week-ends. 4. You should eat very light the first 24 hours after surgery, such as soup, crackers, pudding, etc.  Resume your normal diet the day after surgery. 5. Most patients will experience some swelling and bruising in the breast.  Ice packs and a good support bra will help.  Swelling and bruising can take several days to resolve.  6. It is common to experience some constipation if taking pain medication after surgery.  Increasing fluid intake and taking a stool softener will usually help or prevent this problem from occurring.  A mild laxative (Milk of Magnesia or Miralax) should be taken according to package directions if there are no bowel movements after 48 hours. 7. Unless discharge instructions indicate otherwise, you may remove your bandages 24-48 hours after surgery, and you may shower at that time.  You may have steri-strips (small skin tapes) in place directly over the incision.  These strips should be left on the skin for 7-10 days.  If your surgeon used skin glue on the incision, you may shower  in 24 hours.  The glue will flake off over the next 2-3 weeks.  Any sutures or staples will be removed at the office during your follow-up visit. 8. ACTIVITIES:  You may resume regular daily activities (gradually increasing) beginning the next day.  Wearing a good support bra or sports bra minimizes pain and swelling.  You may have sexual intercourse when it is comfortable. a. You may drive when you no longer are taking prescription pain medication, you can comfortably wear a seatbelt, and you can safely maneuver your car and apply brakes. b. RETURN TO WORK:  ______________________________________________________________________________________ 9. You should see your doctor in the office for a follow-up appointment approximately two weeks after your surgery.  Your doctor's nurse will typically make your follow-up appointment when she calls you with your pathology report.  Expect your pathology report 2-3 business days after your surgery.  You may call to check if you do not hear from [place] after three days. 10. OTHER INSTRUCTIONS: _______________________________________________________________________________________________ _____________________________________________________________________________________________________________________________________ _____________________________________________________________________________________________________________________________________ _____________________________________________________________________________________________________________________________________  WHEN TO CALL YOUR DOCTOR: 1. Fever over 101.0 2. Nausea and/or vomiting. 3. Extreme swelling or bruising. 4. Continued bleeding from incision. 5. Increased pain, redness, or drainage from the incision.  The clinic staff is available to answer your questions during regular business hours.  Please don't hesitate to call and ask to speak to one of the nurses for clinical concerns.  If you  have a medical emergency, go to the nearest emergency room or call 911.  A surgeon from [facility] is always on call at the hospital.  For further questions, please visit [email or web address]    Post Anesthesia Home Care Instructions  Activity: Get plenty of rest for the remainder of the day. A responsible individual must stay with you for 24 hours following the procedure.  For the next 24 hours, DO NOT: -Drive a car -Paediatric nurse -Drink alcoholic beverages -Take any medication unless instructed by your physician -Make any legal decisions or sign important papers.  Meals: Start with liquid foods such as gelatin or soup. Progress to regular foods as tolerated. Avoid greasy, spicy, heavy foods. If nausea and/or vomiting occur, drink only clear liquids until the nausea and/or vomiting subsides. Call your physician if vomiting continues.  Special Instructions/Symptoms: Your throat may feel dry or sore from the anesthesia or the breathing tube placed in your throat during surgery. If this causes discomfort, gargle with warm salt water. The discomfort should disappear within 24 hours.  If you had a scopolamine patch placed behind your ear for the management of post- operative nausea and/or vomiting:  1. The medication in the patch is effective for 72 hours, after which it should be removed.  Wrap patch in a tissue and discard in the trash. Wash hands thoroughly with soap and water. 2. You may remove the patch earlier than 72 hours if you experience unpleasant side effects which may include dry mouth, dizziness or visual disturbances. 3. Avoid touching the patch. Wash your hands with soap and water after contact with the patch.

## 2020-04-17 NOTE — Transfer of Care (Signed)
Immediate Anesthesia Transfer of Care Note  Patient: Chelsea Hobbs  Procedure(s) Performed: RIGHT BREAST LUMPECTOMY WITH RADIOACTIVE SEED LOCALIZATION (Right Breast) REMOVAL PORT-A-CATH (Right Chest)  Patient Location: PACU  Anesthesia Type:General  Level of Consciousness: awake, alert  and oriented  Airway & Oxygen Therapy: Patient Spontanous Breathing and Patient connected to face mask oxygen  Post-op Assessment: Report given to RN and Post -op Vital signs reviewed and stable  Post vital signs: Reviewed and stable  Last Vitals:  Vitals Value Taken Time  BP 119/75 04/17/20 1200  Temp    Pulse 66 04/17/20 1200  Resp 16 04/17/20 1200  SpO2 100 % 04/17/20 1200  Vitals shown include unvalidated device data.  Last Pain:  Vitals:   04/17/20 0937  TempSrc: Oral  PainSc: 0-No pain         Complications: No complications documented.

## 2020-04-17 NOTE — Anesthesia Postprocedure Evaluation (Signed)
Anesthesia Post Note  Patient: Chelsea Hobbs  Procedure(s) Performed: RIGHT BREAST LUMPECTOMY WITH RADIOACTIVE SEED LOCALIZATION (Right Breast) REMOVAL PORT-A-CATH (Right Chest)     Patient location during evaluation: PACU Anesthesia Type: General Level of consciousness: awake and alert Pain management: pain level controlled Vital Signs Assessment: post-procedure vital signs reviewed and stable Respiratory status: spontaneous breathing, nonlabored ventilation and respiratory function stable Cardiovascular status: blood pressure returned to baseline and stable Postop Assessment: no apparent nausea or vomiting Anesthetic complications: no   No complications documented.  Last Vitals:  Vitals:   04/17/20 1245 04/17/20 1246  BP: 117/73   Pulse: 65 65  Resp: 16 14  Temp:    SpO2: 100% 100%    Last Pain:  Vitals:   04/17/20 1245  TempSrc:   PainSc: 0-No pain                 Audry Pili

## 2020-04-18 ENCOUNTER — Encounter (HOSPITAL_BASED_OUTPATIENT_CLINIC_OR_DEPARTMENT_OTHER): Payer: Self-pay | Admitting: Surgery

## 2020-04-18 LAB — SURGICAL PATHOLOGY

## 2020-04-29 ENCOUNTER — Inpatient Hospital Stay: Payer: BC Managed Care – PPO | Attending: Hematology

## 2020-04-29 ENCOUNTER — Encounter: Payer: Self-pay | Admitting: Hematology

## 2020-04-29 ENCOUNTER — Inpatient Hospital Stay (HOSPITAL_BASED_OUTPATIENT_CLINIC_OR_DEPARTMENT_OTHER): Payer: BC Managed Care – PPO | Admitting: Hematology

## 2020-04-29 ENCOUNTER — Other Ambulatory Visit: Payer: Self-pay

## 2020-04-29 VITALS — BP 120/78 | HR 86 | Temp 98.6°F | Resp 18

## 2020-04-29 DIAGNOSIS — Z6821 Body mass index (BMI) 21.0-21.9, adult: Secondary | ICD-10-CM | POA: Diagnosis not present

## 2020-04-29 DIAGNOSIS — D649 Anemia, unspecified: Secondary | ICD-10-CM | POA: Diagnosis not present

## 2020-04-29 DIAGNOSIS — C50212 Malignant neoplasm of upper-inner quadrant of left female breast: Secondary | ICD-10-CM

## 2020-04-29 DIAGNOSIS — Z9221 Personal history of antineoplastic chemotherapy: Secondary | ICD-10-CM | POA: Insufficient documentation

## 2020-04-29 DIAGNOSIS — Z170 Estrogen receptor positive status [ER+]: Secondary | ICD-10-CM | POA: Diagnosis not present

## 2020-04-29 DIAGNOSIS — R21 Rash and other nonspecific skin eruption: Secondary | ICD-10-CM

## 2020-04-29 DIAGNOSIS — Z79810 Long term (current) use of selective estrogen receptor modulators (SERMs): Secondary | ICD-10-CM | POA: Diagnosis not present

## 2020-04-29 DIAGNOSIS — Z01419 Encounter for gynecological examination (general) (routine) without abnormal findings: Secondary | ICD-10-CM | POA: Diagnosis not present

## 2020-04-29 DIAGNOSIS — Z5112 Encounter for antineoplastic immunotherapy: Secondary | ICD-10-CM | POA: Insufficient documentation

## 2020-04-29 DIAGNOSIS — Z923 Personal history of irradiation: Secondary | ICD-10-CM | POA: Diagnosis not present

## 2020-04-29 DIAGNOSIS — C50919 Malignant neoplasm of unspecified site of unspecified female breast: Secondary | ICD-10-CM | POA: Diagnosis not present

## 2020-04-29 DIAGNOSIS — M810 Age-related osteoporosis without current pathological fracture: Secondary | ICD-10-CM | POA: Diagnosis not present

## 2020-04-29 MED ORDER — SODIUM CHLORIDE 0.9 % IV SOLN
Freq: Once | INTRAVENOUS | Status: AC
  Filled 2020-04-29: qty 250

## 2020-04-29 MED ORDER — PREDNISONE 5 MG PO TABS: 5.0000 mg | ORAL_TABLET | Freq: Every day | ORAL | 0 refills | Status: DC

## 2020-04-29 MED ORDER — DIPHENHYDRAMINE HCL 25 MG PO CAPS
50.0000 mg | ORAL_CAPSULE | Freq: Once | ORAL | Status: AC
  Administered 2020-04-29: 50 mg via ORAL

## 2020-04-29 MED ORDER — TRASTUZUMAB-DKST CHEMO 150 MG IV SOLR
6.0000 mg/kg | Freq: Once | INTRAVENOUS | Status: AC
  Administered 2020-04-29: 336 mg via INTRAVENOUS
  Filled 2020-04-29: qty 16

## 2020-04-29 MED ORDER — ACETAMINOPHEN 325 MG PO TABS
ORAL_TABLET | ORAL | Status: AC
  Filled 2020-04-29: qty 2

## 2020-04-29 MED ORDER — DOXYCYCLINE HYCLATE 100 MG PO TABS: 100.0000 mg | ORAL_TABLET | Freq: Two times a day (BID) | ORAL | 0 refills | Status: DC

## 2020-04-29 MED ORDER — DIPHENHYDRAMINE HCL 25 MG PO CAPS
ORAL_CAPSULE | ORAL | Status: AC
  Filled 2020-04-29: qty 2

## 2020-04-29 MED ORDER — ACETAMINOPHEN 325 MG PO TABS
650.0000 mg | ORAL_TABLET | Freq: Once | ORAL | Status: AC
  Administered 2020-04-29: 650 mg via ORAL

## 2020-04-29 NOTE — Progress Notes (Signed)
[place]   [phone] [phone]   Clinic Follow up Note   Patient Care Team: Wenda Low, MD as PCP - General (Internal Medicine) Rockwell Germany, RN as Oncology Nurse Navigator Mauro Kaufmann, RN as Oncology Nurse Navigator Erroll Luna, MD as Consulting Physician (General Surgery) Truitt Merle, MD as Consulting Physician (Hematology) Gery Pray, MD as Consulting Physician (Radiation Oncology) Alla Feeling, NP as Nurse Practitioner (Nurse Practitioner)  Date of Service:  04/29/2020  CHIEF COMPLAINT: Skin Rash  SUMMARY OF ONCOLOGIC HISTORY: Oncology History Overview Note  Cancer Staging Malignant neoplasm of upper-inner quadrant of left breast in female, estrogen receptor positive ([place]) Staging form: Breast, AJCC 8th Edition - Clinical stage from 03/22/2019: Stage IA (cT1c, cN0, cM0, G3, ER+, PR+, HER2+) - Signed by Truitt Merle, MD on 03/22/2019    Malignant neoplasm of upper-inner quadrant of left breast in female, estrogen receptor positive ([place])  03/10/2019 Mammogram   Diagnostic Mammogram 03/10/19  IMPRESSION: 1. Suspicious mass in the 10 o'clock position of the left breast, 3cm from the nipple measuring 12x7x90m. Biopsy is indicated. 2. Indeterminate mass in the 5:30 o'clock position of the left breast, 5cm from the nipple, measuring 9x6x774m Although this may reflect a fibroadenoma, tissue sampling is recommended.   03/16/2019 Initial Biopsy   Diagnosis 03/16/19  1. 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 SEE NOTE 2. Breast, left, needle core biopsy, 5:30 o'clock - FIBROADENOMA   03/16/2019 Receptors her2   Results: IMMUNOHISTOCHEMICAL AND MORPHOMETRIC ANALYSIS PERFORMED MANUALLY The tumor cells are POSITIVE for Her2 (3+). Estrogen Receptor: 100%, POSITIVE, STRONG STAINING INTENSITY Progesterone Receptor: 100%, POSITIVE, STRONG STAINING INTENSITY Proliferation Marker Ki67: 60%     03/21/2019 Initial Diagnosis   Malignant neoplasm of upper-inner quadrant of left breast in female, estrogen receptor positive ([place]  03/22/2019 Cancer Staging   Staging form: Breast, AJCC 8th Edition - Clinical stage from 03/22/2019: Stage IA (cT1c, cN0, cM0, G3, ER+, PR+, HER2+) - Signed by FeTruitt MerleMD on 03/22/2019   03/31/2019 Genetic Testing   VUS in CTNNA1 called [id number] (Intronic) was identified on the [employer] Common Hereditary Cancers Panel. The Common Hereditary Cancers Panel offered by [facility] includes sequencing and/or deletion duplication testing of the following 47 genes: APC, ATM, AXIN2, BARD1, BMPR1A, BRCA1, BRCA2, BRIP1, CDH1, CDKN2A (p14ARF), CDKN2A (p16INK4a), CKD4, CHEK2, CTNNA1, DICER1, EPCAM (Deletion/duplication testing only), GREM1 (promoter region deletion/duplication testing only), KIT, MEN1, MLH1, MSH2, MSH3, MSH6, MUTYH, NBN, NF1, NHTL1, PALB2, PDGFRA, PMS2, POLD1, POLE, PTEN, RAD50, RAD51C, RAD51D, SDHB, SDHC, SDHD, SMAD4, SMARCA4. STK11, TP53, TSC1, TSC2, and VHL.  The following genes were evaluated for sequence changes only: SDHA and HOXB13 c.251G>A variant only. The report date is 03/30/2019.    03/31/2019 Breast MRI   MRI breast 03/31/19  IMPRESSION: 1. 5 millimeter enhancing oval mass in the anteromedial aspect of the RIGHT breast warranting further evaluation. Given its anterior depth and visibility on noncontrast images, ultrasound is recommended to determine if there is a sonographic correlate. If no sonographic correlate is identified to guide biopsy, MRI biopsy of the RIGHT breast is recommended. 2. Known malignancy in the UPPER INNER QUADRANT of the LEFT breast measuring 1.0 centimeters. 3. 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   04/04/2019 Surgery   RADIOCATIVE SEED GUIDED LEFT BREAST LUMPECTOMY, LEFT AXILLARY SENTINEL LYMPH NODE BIOPSY and Chelsea Hobbs placement  By Dr. CoBrantley Stage9/22/20    04/04/2019 Pathology Results  DIAGNOSIS: 04/04/19  A. BREAST, LEFT, LUMPECTOMY:  -  Invasive ductal carcinoma, Nottingham grade 3 of 3, 1.3 cm  -  Ductal carcinoma in-situ, intermediate grade  -  Margins uninvolved by carcinoma (0.1 cm; posterior margin; see  comment)  -  Previous biopsy site changes present  -  See oncology table and comment below   B. BREAST, LEFT, ADDITIONAL MEDIAL MARGIN, EXCISION:  -  Fibrocystic changes  -  No residual carcinoma identified   C. LYMPH NODE, LEFT AXILLARY, SENTINEL, BIOPSY:  -   No carcinoma identified in one lymph node (0/1)    04/04/2019 Cancer Staging   Staging form: Breast, AJCC 8th Edition - Pathologic stage from 04/04/2019: Stage IA (pT1c, pN0, cM0, G3, ER+, PR+, HER2+) - Signed by Truitt Merle, MD on 04/21/2019   04/11/2019 Pathology Results   Diagnosis Breast, right, needle core biopsy, 2 o'clock - SMALL INTRADUCTAL PAPILLOMA. SEE NOTE - SMALL FIBROADENOMA - NEGATIVE FOR CARCINOMA   05/12/2019 -  Chemotherapy   Adjuvant Weekly Taxol and Herceptin for 12 weeks starting 05/12/19, switched to Abraxane on 08/04/19 due to skin rash. Completed on 08/11/19.  Followed by maintenance Herceptin q3weeks to complete 1 year treatment (from 05/12/19) starting 09/01/19.     08/29/2019 - 09/26/2019 Radiation Therapy   Adjuvant radiation per Dr. Sondra Come    10/12/2019 -  Anti-estrogen oral therapy   Tamoxifen 33m daily starting 10/12/19   11/27/2019 Survivorship   SCP delivered by LCira Rue NP       CURRENT THERAPY:  Maintenance Herceptin q3weeks to complete 1 year treatment(from 05/12/19) starting 09/01/19. Zometa every 6 months starting5/17/21 Tamoxifen 242mdaily starting 10/12/19  INTERVAL HISTORY:  Chelsea Hobbs is here for skin rash. She notes her Chelsea Hobbs was removed 2 weeks ago. [facility] and [facility] nipple. She denies pain from this but has significant itching. She tried neosporin, hydrocortisone, benadryl cream, aloe vera and Vaseline. She also has been  taking benadryl orally.    REVIEW OF SYSTEMS:   Constitutional: Denies fevers, chills or abnormal weight loss Eyes: Denies blurriness of vision Ears, nose, mouth, throat, and face: Denies mucositis or sore throat Respiratory: Denies cough, dyspnea or wheezes Cardiovascular: Denies palpitation, chest discomfort or lower extremity swelling Gastrointestinal:  Denies nausea, heartburn or change in bowel habits Skin: (+)  [facility] right [facility] incision Lymphatics: Denies new lymphadenopathy or easy bruising Neurological:Denies numbness, tingling or new weaknesses Behavioral/Psych: Mood is stable, no new changes  All other systems were reviewed with the patient and are negative.  MEDICAL HISTORY:  Past Medical History:  Diagnosis Date  . Asthma   . Family history of breast cancer   . Family history of lung cancer   . Personal history of chemotherapy   . Personal history of radiation therapy     SURGICAL HISTORY: Past Surgical History:  Procedure Laterality Date  . BREAST BIOPSY Right   . BREAST LUMPECTOMY Left 04/04/2019  . BREAST LUMPECTOMY WITH RADIOACTIVE SEED AND SENTINEL LYMPH NODE BIOPSY Left 04/04/2019   Procedure: RADIOCATIVE SEED GUIDED LEFT BREAST LUMPECTOMY, LEFT AXILLARY SENTINEL LYMPH NODE BIOPSY;  Surgeon: CoErroll LunaMD;  [place]: [place];  Laterality: Left;  . BREAST LUMPECTOMY WITH RADIOACTIVE SEED LOCALIZATION Right 04/17/2020   Procedure: RIGHT BREAST LUMPECTOMY WITH RADIOACTIVE SEED LOCALIZATION;  Surgeon: CoErroll LunaMD;  [place]: [place];  Laterality: Right;  . NASAL  SINUS SURGERY    . PORT-A-CATH REMOVAL Right 04/17/2020   Procedure: REMOVAL PORT-A-CATH;  Surgeon: Erroll Luna, MD;  Location: [place];  Service: General;  Laterality: Right;  . PORTACATH PLACEMENT N/A 04/04/2019   Procedure: INSERTION PORT-A-CATH WITH ULTRASOUND;  Surgeon:  Erroll Luna, MD;  Location: [place];  Service: General;  Laterality: N/A;    I have reviewed the social history and family history with the patient and they are unchanged from previous note.  ALLERGIES:  is allergic to doxycycline and latex.  MEDICATIONS:  Current Outpatient Medications  Medication Sig Dispense Refill  . albuterol (PROVENTIL HFA;VENTOLIN HFA) 108 (90 BASE) MCG/ACT inhaler Inhale 2 puffs into the lungs every 6 (six) hours as needed. For shortness of breath    . ALPRAZolam (XANAX) 1 MG tablet Take 0.5 mg by mouth 2 (two) times daily as needed.    . Ascorbic Acid (VITAMIN C) 1000 MG tablet Take 1,000 mg by mouth daily.    . cholecalciferol (VITAMIN D3) 25 MCG (1000 UT) tablet Take 2,000 Units by mouth daily.     Marland Kitchen doxycycline (VIBRA-TABS) 100 MG tablet Take 1 tablet (100 mg total) by mouth 2 (two) times daily. 14 tablet 0  . fexofenadine (ALLEGRA) 60 MG tablet Take 60 mg by mouth 2 (two) times daily.    . fluticasone (FLONASE) 50 MCG/ACT nasal spray Place into both nostrils daily.    Marland Kitchen HYDROcodone-acetaminophen (NORCO/VICODIN) 5-325 MG tablet Take 1 tablet by mouth every 6 (six) hours as needed for moderate pain. 15 tablet 0  . ibuprofen (ADVIL) 800 MG tablet Take 1 tablet (800 mg total) by mouth every 8 (eight) hours as needed. 30 tablet 0  . lidocaine-prilocaine (EMLA) cream Apply to affected area once 30 g 3  . mesalamine (CANASA) 1000 MG suppository Place 1,000 mg rectally at bedtime.    Marland Kitchen omeprazole (PRILOSEC) 20 MG capsule TAKE 1 CAPSULE BY MOUTH EVERY DAY 90 capsule 0  . ondansetron (ZOFRAN) 8 MG tablet Take 1 tablet (8 mg total) by mouth 2 (two) times daily as needed (Nausea or vomiting). 30 tablet 1  . predniSONE (DELTASONE) 5 MG tablet Take 1 tablet (5 mg total) by mouth daily with breakfast. Take 4 tabs for first day, then 3 tabs on day 2, 2 tabs on day 3 and 1 tab on day 4 then stop 10 tablet 0  . SUMAtriptan (IMITREX) 100 MG tablet Take 100 mg  by mouth.    . tamoxifen (NOLVADEX) 20 MG tablet Take 1 tablet (20 mg total) by mouth daily. 90 tablet 1  . venlafaxine XR (EFFEXOR-XR) 37.5 MG 24 hr capsule TAKE 1 CAPSULE BY MOUTH DAILY WITH BREAKFAST. 90 capsule 1   No current facility-administered medications for this visit.    PHYSICAL EXAMINATION: ECOG PERFORMANCE STATUS: 1 - Symptomatic but completely ambulatory  There were no vitals filed for this visit. There were no vitals filed for this visit.  GENERAL:alert, no distress and comfortable SKIN: skin color, texture, turgor are normal (+) Skin rash at prior [facility] site at upper right chest as pictured below EYES: normal, Conjunctiva are pink and non-injected, sclera clear  NECK: supple, thyroid normal size, non-tender, without nodularity LYMPH:  no palpable lymphadenopathy in the cervical, axillary  LUNGS: clear to auscultation and percussion with normal breathing effort HEART: regular rate & rhythm and no murmurs and no lower extremity edema ABDOMEN:abdomen soft, non-tender and normal bowel sounds Musculoskeletal:no cyanosis of digits and no clubbing  NEURO:  alert & oriented x 3 with fluent speech, no focal motor/sensory deficits BREAST: S/p right lumpectomy: Surgical incision healed well (+) Right nipple incision slightly open with moderate skin erythema as pictured below           LABORATORY DATA:  I have reviewed the data as listed CBC Latest Ref Rng & Units 03/19/2020 02/26/2020 01/12/2020  WBC 4.0 - 10.5 K/uL 8.0 6.0 6.1  Hemoglobin 12.0 - 15.0 g/dL 11.9(L) 12.3 12.1  Hematocrit 36 - 46 % 33.9(L) 35.6(L) 35.2(L)  Platelets 150 - 400 K/uL 264 263 270     CMP Latest Ref Rng & Units 03/19/2020 02/26/2020 01/12/2020  Glucose 70 - 99 mg/dL 90 94 93  BUN 6 - 20 mg/dL _0 Creatinine 0.44 - 1.00 mg/dL 0.70 0.69 0.74  Sodium 135 - 145 mmol/L 138 142 141  Potassium 3.5 - 5.1 mmol/L 4.0 4.0 4.2  Chloride 98 - 111 mmol/L 105 110 107  CO2 22 - 32 mmol/L _1 Calcium  8.9 - 10.3 mg/dL 8.8(L) 9.2 8.8(L)  Total Protein 6.5 - 8.1 g/dL 7.0 6.8 7.0  Total Bilirubin 0.3 - 1.2 mg/dL 0.4 0.3 0.3  Alkaline Phos 38 - 126 U/L 34(L) 34(L) 38  AST 15 - 41 U/L _2 ALT 0 - 44 U/L _3 RADIOGRAPHIC STUDIES: I have personally reviewed the radiological images as listed and agreed with the findings in the report. No results found.   ASSESSMENT & PLAN:  Chelsea Hobbs is a 49 y.o. female with    1. Skin Rash at port side and right breast, allergy vs cellulitis  -She underwent right lumpectomy for fibroadenoma and port removal on April 17, 2020  -She noticed diffuse skin rash at prior [facility] site and surgical incision at right breast last week with severe pruritis. -She also has mild opening of right nipple incision after right breast lumpectomy with skin erythema. This is concerning for mild cellulitis  -Both rashes pictured above. This started 3 days ago with itching. Topical hydrocortisone, benadryl did not help.  -I will call in doxycycline for 7 days and tapering dose of prednisone for 4 days -I will copy note to Dr Brantley Stage to get his input   2. Malignant neoplasm of upper-inner quadrant of left breast, StageIA,p(T1cN0M0), ER+/PR+, HER2+, GradeIII -she is currently on maintenance trastuzumab, plan to complete in 3 weeks -Continue tamoxifen  PLAN:  -I called in Doxycycline for 7 days and tapering prednisone for 4 days today  -We will copy Dr. Brantley Stage    No problem-specific Assessment & Plan notes found for this encounter.   No orders of the defined types were placed in this encounter.  All questions were answered. The patient knows to call the clinic with any problems, questions or concerns. No barriers to learning was detected. The total time spent in the appointment was 20 minutes.     Truitt Merle, MD 04/29/2020   I, Joslyn Devon, am acting as scribe for Truitt Merle, MD.   I have reviewed the above documentation for accuracy and  completeness, and I agree with the above.

## 2020-04-29 NOTE — Patient Instructions (Signed)
[facility] Discharge Instructions for Patients Receiving Chemotherapy  Today you received the following chemotherapy agents trastuzumab.  To help prevent nausea and vomiting after your treatment, we encourage you to take your nausea medication as directed.    If you develop nausea and vomiting that is not controlled by your nausea medication, call the clinic.   BELOW ARE SYMPTOMS THAT SHOULD BE REPORTED IMMEDIATELY:  *FEVER GREATER THAN 100.5 F  *CHILLS WITH OR WITHOUT FEVER  NAUSEA AND VOMITING THAT IS NOT CONTROLLED WITH YOUR NAUSEA MEDICATION  *UNUSUAL SHORTNESS OF BREATH  *UNUSUAL BRUISING OR BLEEDING  TENDERNESS IN MOUTH AND THROAT WITH OR WITHOUT PRESENCE OF ULCERS  *URINARY PROBLEMS  *BOWEL PROBLEMS  UNUSUAL RASH Items with * indicate a potential emergency and should be followed up as soon as possible.  Feel free to call the clinic should you have any questions or concerns. The clinic phone number is [phone].  Please show the CHEMO ALERT CARD at check-in to the Emergency Department and triage nurse.   

## 2020-05-16 DIAGNOSIS — L209 Atopic dermatitis, unspecified: Secondary | ICD-10-CM | POA: Diagnosis not present

## 2020-05-17 ENCOUNTER — Other Ambulatory Visit: Payer: Self-pay

## 2020-05-17 DIAGNOSIS — C50212 Malignant neoplasm of upper-inner quadrant of left female breast: Secondary | ICD-10-CM

## 2020-05-17 DIAGNOSIS — Z170 Estrogen receptor positive status [ER+]: Secondary | ICD-10-CM

## 2020-05-17 NOTE — Progress Notes (Signed)
[place]   [phone] Fax:([phone]   Clinic Follow up Note   Patient Care Team: Wenda Low, MD as PCP - General (Internal Medicine) Rockwell Germany, RN as Oncology Nurse Navigator Mauro Kaufmann, RN as Oncology Nurse Navigator Erroll Luna, MD as Consulting Physician (General Surgery) Truitt Merle, MD as Consulting Physician (Hematology) Gery Pray, MD as Consulting Physician (Radiation Oncology) Alla Feeling, NP as Nurse Practitioner (Nurse Practitioner)  Date of Service:  05/20/2020  CHIEF COMPLAINT: F/u of left breast cancer  SUMMARY OF ONCOLOGIC HISTORY: Oncology History Overview Note  Cancer Staging Malignant neoplasm of upper-inner quadrant of left breast in female, estrogen receptor positive ([place]) Staging form: Breast, AJCC 8th Edition - Clinical stage from 03/22/2019: Stage IA (cT1c, cN0, cM0, G3, ER+, PR+, HER2+) - Signed by Truitt Merle, MD on 03/22/2019    Malignant neoplasm of upper-inner quadrant of left breast in female, estrogen receptor positive ([place])  03/10/2019 Mammogram   Diagnostic Mammogram 03/10/19  IMPRESSION: 1. Suspicious mass in the 10 o'clock position of the left breast, 3cm from the nipple measuring 12x7x27m. Biopsy is indicated. 2. Indeterminate mass in the 5:30 o'clock position of the left breast, 5cm from the nipple, measuring 9x6x7108m Although this may reflect a fibroadenoma, tissue sampling is recommended.   03/16/2019 Initial Biopsy   Diagnosis 03/16/19  1. 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 SEE NOTE 2. Breast, left, needle core biopsy, 5:30 o'clock - FIBROADENOMA   03/16/2019 Receptors her2   Results: IMMUNOHISTOCHEMICAL AND MORPHOMETRIC ANALYSIS PERFORMED MANUALLY The tumor cells are POSITIVE for Her2 (3+). Estrogen Receptor: 100%, POSITIVE, STRONG STAINING INTENSITY Progesterone Receptor: 100%, POSITIVE, STRONG STAINING INTENSITY Proliferation Marker  Ki67: 60%   03/21/2019 Initial Diagnosis   Malignant neoplasm of upper-inner quadrant of left breast in female, estrogen receptor positive ([place]  03/22/2019 Cancer Staging   Staging form: Breast, AJCC 8th Edition - Clinical stage from 03/22/2019: Stage IA (cT1c, cN0, cM0, G3, ER+, PR+, HER2+) - Signed by FeTruitt MerleMD on 03/22/2019   03/31/2019 Genetic Testing   VUS in CTNNA1 called c.[id number]>T (Intronic) was identified on the [employer] Common Hereditary Cancers Panel. The Common Hereditary Cancers Panel offered by [facility] includes sequencing and/or deletion duplication testing of the following 47 genes: APC, ATM, AXIN2, BARD1, BMPR1A, BRCA1, BRCA2, BRIP1, CDH1, CDKN2A (p14ARF), CDKN2A (p16INK4a), CKD4, CHEK2, CTNNA1, DICER1, EPCAM (Deletion/duplication testing only), GREM1 (promoter region deletion/duplication testing only), KIT, MEN1, MLH1, MSH2, MSH3, MSH6, MUTYH, NBN, NF1, NHTL1, PALB2, PDGFRA, PMS2, POLD1, POLE, PTEN, RAD50, RAD51C, RAD51D, SDHB, SDHC, SDHD, SMAD4, SMARCA4. STK11, TP53, TSC1, TSC2, and VHL.  The following genes were evaluated for sequence changes only: SDHA and HOXB13 c.251G>A variant only. The report date is 03/30/2019.    03/31/2019 Breast MRI   MRI breast 03/31/19  IMPRESSION: 1. 5 millimeter enhancing oval mass in the anteromedial aspect of the RIGHT breast warranting further evaluation. Given its anterior depth and visibility on noncontrast images, ultrasound is recommended to determine if there is a sonographic correlate. If no sonographic correlate is identified to guide biopsy, MRI biopsy of the RIGHT breast is recommended. 2. Known malignancy in the UPPER INNER QUADRANT of the LEFT breast measuring 1.0 centimeters. 3. 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   04/04/2019 Surgery   RADIOCATIVE SEED GUIDED LEFT BREAST LUMPECTOMY, LEFT AXILLARY SENTINEL LYMPH NODE BIOPSY and PAC placement  By Dr. CoBrantley Stage9/22/20    04/04/2019  Pathology  Results   DIAGNOSIS: 04/04/19  A. BREAST, LEFT, LUMPECTOMY:  -  Invasive ductal carcinoma, Nottingham grade 3 of 3, 1.3 cm  -  Ductal carcinoma in-situ, intermediate grade  -  Margins uninvolved by carcinoma (0.1 cm; posterior margin; see  comment)  -  Previous biopsy site changes present  -  See oncology table and comment below   B. BREAST, LEFT, ADDITIONAL MEDIAL MARGIN, EXCISION:  -  Fibrocystic changes  -  No residual carcinoma identified   C. LYMPH NODE, LEFT AXILLARY, SENTINEL, BIOPSY:  -   No carcinoma identified in one lymph node (0/1)    04/04/2019 Cancer Staging   Staging form: Breast, AJCC 8th Edition - Pathologic stage from 04/04/2019: Stage IA (pT1c, pN0, cM0, G3, ER+, PR+, HER2+) - Signed by Truitt Merle, MD on 04/21/2019   04/11/2019 Pathology Results   Diagnosis Breast, right, needle core biopsy, 2 o'clock - SMALL INTRADUCTAL PAPILLOMA. SEE NOTE - SMALL FIBROADENOMA - NEGATIVE FOR CARCINOMA   05/12/2019 - 06/10/2020 Chemotherapy   Adjuvant Weekly Taxol and Herceptin for 12 weeks starting 05/12/19, switched to Abraxane on 08/04/19 due to skin rash. Completed on 08/11/19.  Followed by maintenance Herceptin q3weeks to complete 1 year treatment (from 05/12/19) starting 09/01/19-06/10/20   08/29/2019 - 09/26/2019 Radiation Therapy   Adjuvant radiation per Dr. Sondra Come    10/12/2019 -  Anti-estrogen oral therapy   Tamoxifen 33m daily starting 10/12/19   11/27/2019 Survivorship   SCP delivered by LCira Rue NP    03/15/2020 Mammogram   IMPRESSION: 1. Expected surgical changes at the lumpectomy site in the central posterior left breast.   2. No suspicious changes at the site of the biopsied papilloma in the lower inner retroareolar right breast (ribbon clip). Surgical excision is planned for this lesion.   3.  No evidence of malignancy in the bilateral breasts.   04/17/2020 Surgery   RIGHT BREAST LUMPECTOMY WITH RADIOACTIVE SEED LOCALIZATION and PAC removal by Dr  CBrantley Stage  FINAL MICROSCOPIC DIAGNOSIS:   A. BREAST, RIGHT, LUMPECTOMY:  - Biopsy clip.  Fibroadenoma.  Fibroadenomatoid change.  Fibrocystic  change.        CURRENT THERAPY:  Maintenance Herceptin q3weeks to complete 1 year treatment (from 05/12/19) starting 09/01/19-06/10/20 Zometa every 6 months starting5/17/21 Tamoxifen 260mdaily starting 10/12/19   INTERVAL HISTORY:  KiRussie Gulledgeeal is here for a follow up and treatment. She presents to the clinic alone. She notes on Tamoxifen she has been having hot flashes. She notes this hit her quick and does not last long or effect her sleep. Overall manageable. She notes mild tremors in her hands since chemo. She notes movement there is no tremors but this can worsen when still or anxious. She notes having a colonoscopy in Fall 2021 which had good results.    REVIEW OF SYSTEMS:   Constitutional: Denies fevers, chills or abnormal weight loss Eyes: Denies blurriness of vision Ears, nose, mouth, throat, and face: Denies mucositis or sore throat Respiratory: Denies cough, dyspnea or wheezes Cardiovascular: Denies palpitation, chest discomfort or lower extremity swelling Gastrointestinal:  Denies nausea, heartburn or change in bowel habits Skin: Denies abnormal skin rashes Lymphatics: Denies new lymphadenopathy or easy bruising Neurological:Denies numbness, tingling or new weaknesses Behavioral/Psych: Mood is stable, no new changes  All other systems were reviewed with the patient and are negative.  MEDICAL HISTORY:  Past Medical History:  Diagnosis Date  . Asthma   . Family history of breast cancer   . Family  history of lung cancer   . Personal history of chemotherapy   . Personal history of radiation therapy     SURGICAL HISTORY: Past Surgical History:  Procedure Laterality Date  . BREAST BIOPSY Right   . BREAST LUMPECTOMY Left 04/04/2019  . BREAST LUMPECTOMY WITH RADIOACTIVE SEED AND SENTINEL LYMPH NODE BIOPSY Left 04/04/2019    Procedure: RADIOCATIVE SEED GUIDED LEFT BREAST LUMPECTOMY, LEFT AXILLARY SENTINEL LYMPH NODE BIOPSY;  Surgeon: Erroll Luna, MD;  Location: [place];  Service: General;  Laterality: Left;  . BREAST LUMPECTOMY WITH RADIOACTIVE SEED LOCALIZATION Right 04/17/2020   Procedure: RIGHT BREAST LUMPECTOMY WITH RADIOACTIVE SEED LOCALIZATION;  Surgeon: Erroll Luna, MD;  Location: [place];  Service: General;  Laterality: Right;  . NASAL SINUS SURGERY    . PORT-A-CATH REMOVAL Right 04/17/2020   Procedure: REMOVAL PORT-A-CATH;  Surgeon: Erroll Luna, MD;  Location: [place];  Service: General;  Laterality: Right;  . PORTACATH PLACEMENT N/A 04/04/2019   Procedure: INSERTION PORT-A-CATH WITH ULTRASOUND;  Surgeon: Erroll Luna, MD;  Location: [place];  Service: General;  Laterality: N/A;    I have reviewed the social history and family history with the patient and they are unchanged from previous note.  ALLERGIES:  is allergic to doxycycline and latex.  MEDICATIONS:  Current Outpatient Medications  Medication Sig Dispense Refill  . albuterol (PROVENTIL HFA;VENTOLIN HFA) 108 (90 BASE) MCG/ACT inhaler Inhale 2 puffs into the lungs every 6 (six) hours as needed. For shortness of breath    . ALPRAZolam (XANAX) 1 MG tablet Take 0.5 mg by mouth 2 (two) times daily as needed.    . Ascorbic Acid (VITAMIN C) 1000 MG tablet Take 1,000 mg by mouth daily.    . cholecalciferol (VITAMIN D3) 25 MCG (1000 UT) tablet Take 2,000 Units by mouth daily.     Marland Kitchen doxycycline (VIBRA-TABS) 100 MG tablet Take 1 tablet (100 mg total) by mouth 2 (two) times daily. 14 tablet 0  . fexofenadine (ALLEGRA) 60 MG tablet Take 60 mg by mouth 2 (two) times daily.    . fluticasone (FLONASE) 50 MCG/ACT nasal spray Place into both nostrils daily.    Marland Kitchen gabapentin (NEURONTIN) 100 MG capsule Take 1 capsule (100 mg total) by mouth at bedtime. Take 1 cap at night for first  week, then increase to 2 caps on second week, and 3 caps on third week if needed. 60 capsule 0  . HYDROcodone-acetaminophen (NORCO/VICODIN) 5-325 MG tablet Take 1 tablet by mouth every 6 (six) hours as needed for moderate pain. 15 tablet 0  . ibuprofen (ADVIL) 800 MG tablet Take 1 tablet (800 mg total) by mouth every 8 (eight) hours as needed. 30 tablet 0  . lidocaine-prilocaine (EMLA) cream Apply to affected area once 30 g 3  . mesalamine (CANASA) 1000 MG suppository Place 1,000 mg rectally at bedtime.    Marland Kitchen omeprazole (PRILOSEC) 20 MG capsule TAKE 1 CAPSULE BY MOUTH EVERY DAY 90 capsule 0  . ondansetron (ZOFRAN) 8 MG tablet Take 1 tablet (8 mg total) by mouth 2 (two) times daily as needed (Nausea or vomiting). 30 tablet 1  . predniSONE (DELTASONE) 5 MG tablet Take 1 tablet (5 mg total) by mouth daily with breakfast. Take 4 tabs for first day, then 3 tabs on day 2, 2 tabs on day 3 and 1 tab on day 4 then stop 10 tablet 0  . SUMAtriptan (IMITREX) 100 MG tablet Take 100 mg by mouth.    Marland Kitchen  tamoxifen (NOLVADEX) 20 MG tablet Take 1 tablet (20 mg total) by mouth daily. 90 tablet 1  . venlafaxine XR (EFFEXOR-XR) 37.5 MG 24 hr capsule TAKE 1 CAPSULE BY MOUTH DAILY WITH BREAKFAST. 90 capsule 1   No current facility-administered medications for this visit.   Facility-Administered Medications Ordered in Other Visits  Medication Dose Route Frequency Provider Last Rate Last Admin  . trastuzumab-dkst (OGIVRI) 336 mg in sodium chloride 0.9 % 250 mL chemo infusion  6 mg/kg (Treatment Plan Recorded) Intravenous Once Truitt Merle, MD        PHYSICAL EXAMINATION: ECOG PERFORMANCE STATUS: 1 - Symptomatic but completely ambulatory  Vitals:   05/20/20 0923  BP: 118/81  Pulse: 89  Resp: 18  Temp: (!) 97 F (36.1 C)  SpO2: 100%   Filed Weights   05/20/20 0923  Weight: 121 lb 9.6 oz (55.2 kg)    GENERAL:alert, no distress and comfortable SKIN: skin color, texture, turgor are normal, no rashes or significant  lesions EYES: normal, Conjunctiva are pink and non-injected, sclera clear  NECK: supple, thyroid normal size, non-tender, without nodularity LYMPH:  no palpable lymphadenopathy in the cervical, axillary  LUNGS: clear to auscultation and percussion with normal breathing effort HEART: regular rate & rhythm and no murmurs and no lower extremity edema ABDOMEN:abdomen soft, non-tender and normal bowel sounds Musculoskeletal:no cyanosis of digits and no clubbing  NEURO: alert & oriented x 3 with fluent speech (+) improved sensory deficits  LABORATORY DATA:  I have reviewed the data as listed CBC Latest Ref Rng & Units 05/20/2020 03/19/2020 02/26/2020  WBC 4.0 - 10.5 K/uL 5.8 8.0 6.0  Hemoglobin 12.0 - 15.0 g/dL 12.0 11.9(L) 12.3  Hematocrit 36 - 46 % 35.1(L) 33.9(L) 35.6(L)  Platelets 150 - 400 K/uL 279 264 263     CMP Latest Ref Rng & Units 05/20/2020 03/19/2020 02/26/2020  Glucose 70 - 99 mg/dL 96 90 94  BUN 6 - 20 mg/dL '19 18 16  ' Creatinine 0.44 - 1.00 mg/dL 0.77 0.70 0.69  Sodium 135 - 145 mmol/L 139 138 142  Potassium 3.5 - 5.1 mmol/L 3.6 4.0 4.0  Chloride 98 - 111 mmol/L 105 105 110  CO2 22 - 32 mmol/L '27 26 24  ' Calcium 8.9 - 10.3 mg/dL 8.9 8.8(L) 9.2  Total Protein 6.5 - 8.1 g/dL 6.8 7.0 6.8  Total Bilirubin 0.3 - 1.2 mg/dL 0.5 0.4 0.3  Alkaline Phos 38 - 126 U/L 32(L) 34(L) 34(L)  AST 15 - 41 U/L '21 18 16  ' ALT 0 - 44 U/L '24 19 15      ' RADIOGRAPHIC STUDIES: I have personally reviewed the radiological images as listed and agreed with the findings in the report. No results found.   ASSESSMENT & PLAN:  Chelsea Hobbs is a 49 y.o. female with    1.Malignant neoplasm of upper-inner quadrant of left breast, StageIA,p(T1cN0M0), ER+/PR+, HER2+, GradeIII -She was diagnosed in 03/2019. She underwent left breastlumpectomyand SLNB on 04/04/19.Her DCIS margins were close (58m). -She completed adjuvant Taxol and Herceptin and adjuvant Radiation. She is currently on maintenance  Herceptin q3weeks. Plan to complete 1 year treatment on 06/10/20.  -Given her ER/PR positive disease and premenopausal status, I started her on Tamoxifen in 10/2019.Tolerating well with some anxiety and hot flashes. -She underwent right  Breast lumpectomy to remove her benign fibroadenoma on 04/17/20.  -She is clinically doing well. Labs reviewed, CBC and CMP WNL. Overall adequate to proceed with Herceptin (OGIVRI) today. Plan to complete on 06/10/20.  -  F/u in 3 months    2.Osteoporosis, Hypocalcemia -Per pt when she had her IUD removedin 03/2019. Herrecent hormonal test showed [place] level was 13.5 (09/12/19). Her last period was in 03/2019 before chemo. Will monitor.  -Per patient her recent2021DEXA showed early osteoporosis of her hip. -I started her on Zometa infusions q32month for 2 years on 11/27/19. -Due to recent hypocalcemia I encouraged her to increase oral calcium to TID.   3. B/l Hand weakness/Tremors -She has b/l hand weakness since completing chemo and shaking occasionally.  -Has improved with massages and PT, but persists in her hands.  -Her sensory deficits has improved on exam today (05/20/20). I will check her TSH level on next labs.  -Given hot flashes and her tremors, I will call in Gabapentin (05/20/20). I also recommend B12 OTC.   4.Insomnia, Anxiety, hot flashes  -She has known anxiety and on Xanax nightly. Given mood changes on Tamoxifen, I started her on low dose Effexor37.554mdaily(01/12/20) and if working she can decrease use of Xanax. -Her Hot flashes and Irritability improved on Effexor, continue. Mood stable and hot flashes manageable.   5. Genetic Testingwas negative for pathogenetic nutations  6. H/o COVID19 (+) in 07/2019, Recovered well. She has received both her COVID19 vaccines.  -Given she completed chemo this year and in a high risk job, I suggest she proceed with COVID19 booster. She is interested.   PLAN: -I called in Gabapentin 10021moday,  she will increase dose as needed  -Continue Tamoxifen  -Proceed with Zometa today and continue every 6 months  -Lab reviewed and adequate to proceed with trastuzumab today  -Lab and final trastuzumab on 06/10/20  -Lab and f/u in 3 months    No problem-specific Assessment & Plan notes found for this encounter.   Orders Placed This Encounter  Procedures  . TSH    Standing Status:   Future    Standing Expiration Date:   05/20/2021   All questions were answered. The patient knows to call the clinic with any problems, questions or concerns. No barriers to learning was detected. The total time spent in the appointment was 30 minutes.     YanTruitt MerleD 05/20/2020   I, AmoJoslyn Devonm acting as scribe for YanTruitt MerleD.   I have reviewed the above documentation for accuracy and completeness, and I agree with the above.

## 2020-05-20 ENCOUNTER — Encounter: Payer: Self-pay | Admitting: Hematology

## 2020-05-20 ENCOUNTER — Encounter: Payer: Self-pay | Admitting: *Deleted

## 2020-05-20 ENCOUNTER — Other Ambulatory Visit: Payer: Self-pay

## 2020-05-20 ENCOUNTER — Inpatient Hospital Stay: Payer: BC Managed Care – PPO

## 2020-05-20 ENCOUNTER — Inpatient Hospital Stay: Payer: BC Managed Care – PPO | Attending: Hematology | Admitting: Hematology

## 2020-05-20 VITALS — BP 118/81 | HR 89 | Temp 97.0°F | Resp 18 | Ht 62.0 in | Wt 121.6 lb

## 2020-05-20 DIAGNOSIS — C170 Malignant neoplasm of duodenum: Secondary | ICD-10-CM | POA: Diagnosis not present

## 2020-05-20 DIAGNOSIS — Z923 Personal history of irradiation: Secondary | ICD-10-CM | POA: Diagnosis not present

## 2020-05-20 DIAGNOSIS — R251 Tremor, unspecified: Secondary | ICD-10-CM

## 2020-05-20 DIAGNOSIS — Z9221 Personal history of antineoplastic chemotherapy: Secondary | ICD-10-CM | POA: Diagnosis not present

## 2020-05-20 DIAGNOSIS — Z79810 Long term (current) use of selective estrogen receptor modulators (SERMs): Secondary | ICD-10-CM | POA: Diagnosis not present

## 2020-05-20 DIAGNOSIS — C50212 Malignant neoplasm of upper-inner quadrant of left female breast: Secondary | ICD-10-CM

## 2020-05-20 DIAGNOSIS — Z95828 Presence of other vascular implants and grafts: Secondary | ICD-10-CM

## 2020-05-20 DIAGNOSIS — M810 Age-related osteoporosis without current pathological fracture: Secondary | ICD-10-CM | POA: Insufficient documentation

## 2020-05-20 DIAGNOSIS — Z170 Estrogen receptor positive status [ER+]: Secondary | ICD-10-CM

## 2020-05-20 LAB — CBC WITH DIFFERENTIAL (CANCER CENTER ONLY)
Abs Immature Granulocytes: 0.01 10*3/uL (ref 0.00–0.07)
Basophils Absolute: 0.1 10*3/uL (ref 0.0–0.1)
Basophils Relative: 1 %
Eosinophils Absolute: 0.3 10*3/uL (ref 0.0–0.5)
Eosinophils Relative: 6 %
HCT: 35.1 % — ABNORMAL LOW (ref 36.0–46.0)
Hemoglobin: 12 g/dL (ref 12.0–15.0)
Immature Granulocytes: 0 %
Lymphocytes Relative: 36 %
Lymphs Abs: 2.1 10*3/uL (ref 0.7–4.0)
MCH: 31.2 pg (ref 26.0–34.0)
MCHC: 34.2 g/dL (ref 30.0–36.0)
MCV: 91.2 fL (ref 80.0–100.0)
Monocytes Absolute: 0.4 10*3/uL (ref 0.1–1.0)
Monocytes Relative: 7 %
Neutro Abs: 2.9 10*3/uL (ref 1.7–7.7)
Neutrophils Relative %: 50 %
Platelet Count: 279 10*3/uL (ref 150–400)
RBC: 3.85 MIL/uL — ABNORMAL LOW (ref 3.87–5.11)
RDW: 12.1 % (ref 11.5–15.5)
WBC Count: 5.8 10*3/uL (ref 4.0–10.5)
nRBC: 0 % (ref 0.0–0.2)

## 2020-05-20 LAB — CMP (CANCER CENTER ONLY)
ALT: 24 U/L (ref 0–44)
AST: 21 U/L (ref 15–41)
Albumin: 3.9 g/dL (ref 3.5–5.0)
Alkaline Phosphatase: 32 U/L — ABNORMAL LOW (ref 38–126)
Anion gap: 7 (ref 5–15)
BUN: 19 mg/dL (ref 6–20)
CO2: 27 mmol/L (ref 22–32)
Calcium: 8.9 mg/dL (ref 8.9–10.3)
Chloride: 105 mmol/L (ref 98–111)
Creatinine: 0.77 mg/dL (ref 0.44–1.00)
GFR, Estimated: >60 mL/min (ref >60)
Glucose, Bld: 96 mg/dL (ref 70–99)
Potassium: 3.6 mmol/L (ref 3.5–5.1)
Sodium: 139 mmol/L (ref 135–145)
Total Bilirubin: 0.5 mg/dL (ref 0.3–1.2)
Total Protein: 6.8 g/dL (ref 6.5–8.1)

## 2020-05-20 MED ORDER — TRASTUZUMAB-DKST CHEMO 150 MG IV SOLR
6.0000 mg/kg | Freq: Once | INTRAVENOUS | Status: AC
  Administered 2020-05-20: 336 mg via INTRAVENOUS
  Filled 2020-05-20: qty 16

## 2020-05-20 MED ORDER — ACETAMINOPHEN 325 MG PO TABS
650.0000 mg | ORAL_TABLET | Freq: Once | ORAL | Status: AC
  Administered 2020-05-20: 650 mg via ORAL

## 2020-05-20 MED ORDER — DIPHENHYDRAMINE HCL 25 MG PO CAPS
ORAL_CAPSULE | ORAL | Status: AC
  Filled 2020-05-20: qty 2

## 2020-05-20 MED ORDER — SODIUM CHLORIDE 0.9 % IV SOLN
Freq: Once | INTRAVENOUS | Status: AC
  Filled 2020-05-20: qty 250

## 2020-05-20 MED ORDER — GABAPENTIN 100 MG PO CAPS: 100.0000 mg | ORAL_CAPSULE | Freq: Every day | ORAL | 0 refills | Status: DC

## 2020-05-20 MED ORDER — ACETAMINOPHEN 325 MG PO TABS
ORAL_TABLET | ORAL | Status: AC
  Filled 2020-05-20: qty 2

## 2020-05-20 MED ORDER — ZOLEDRONIC ACID 4 MG/100ML IV SOLN
4.0000 mg | Freq: Once | INTRAVENOUS | Status: AC
  Administered 2020-05-20: 4 mg via INTRAVENOUS

## 2020-05-20 MED ORDER — ZOLEDRONIC ACID 4 MG/100ML IV SOLN
INTRAVENOUS | Status: AC
  Filled 2020-05-20: qty 100

## 2020-05-20 MED ORDER — DIPHENHYDRAMINE HCL 25 MG PO CAPS
50.0000 mg | ORAL_CAPSULE | Freq: Once | ORAL | Status: AC
  Administered 2020-05-20: 50 mg via ORAL

## 2020-05-20 NOTE — Patient Instructions (Signed)
[facility] Discharge Instructions for Patients Receiving Chemotherapy  Today you received the following chemotherapy agents Trastuzumab   To help prevent nausea and vomiting after your treatment, we encourage you to take your nausea medication as directed at home.    If you develop nausea and vomiting that is not controlled by your nausea medication, call the clinic.   BELOW ARE SYMPTOMS THAT SHOULD BE REPORTED IMMEDIATELY:  *FEVER GREATER THAN 100.5 F  *CHILLS WITH OR WITHOUT FEVER  NAUSEA AND VOMITING THAT IS NOT CONTROLLED WITH YOUR NAUSEA MEDICATION  *UNUSUAL SHORTNESS OF BREATH  *UNUSUAL BRUISING OR BLEEDING  TENDERNESS IN MOUTH AND THROAT WITH OR WITHOUT PRESENCE OF ULCERS  *URINARY PROBLEMS  *BOWEL PROBLEMS  UNUSUAL RASH Items with * indicate a potential emergency and should be followed up as soon as possible.  Feel free to call the clinic should you have any questions or concerns. The clinic phone number is [phone].  Please show the [place] at check-in to the Emergency Department and triage nurse.

## 2020-05-21 ENCOUNTER — Telehealth: Payer: Self-pay | Admitting: Hematology

## 2020-05-21 NOTE — Telephone Encounter (Signed)
Scheduled per 11/8 los. Unable to reach pt. Left voicemail with appt times and dates.

## 2020-05-27 ENCOUNTER — Ambulatory Visit (HOSPITAL_COMMUNITY)
Admission: RE | Admit: 2020-05-27 | Discharge: 2020-05-27 | Disposition: A | Discharge status: Home / Self Care | Payer: BC Managed Care – PPO | Source: Ambulatory Visit | Attending: Hematology | Admitting: Hematology

## 2020-05-27 ENCOUNTER — Other Ambulatory Visit: Payer: Self-pay

## 2020-05-27 DIAGNOSIS — Z0189 Encounter for other specified special examinations: Secondary | ICD-10-CM | POA: Diagnosis not present

## 2020-05-27 DIAGNOSIS — Z170 Estrogen receptor positive status [ER+]: Secondary | ICD-10-CM | POA: Diagnosis not present

## 2020-05-27 DIAGNOSIS — C50212 Malignant neoplasm of upper-inner quadrant of left female breast: Secondary | ICD-10-CM | POA: Diagnosis not present

## 2020-05-27 DIAGNOSIS — I071 Rheumatic tricuspid insufficiency: Secondary | ICD-10-CM | POA: Diagnosis not present

## 2020-05-27 NOTE — Progress Notes (Signed)
  Echocardiogram 2D Echocardiogram has been performed.  Chelsea Hobbs 05/27/2020, 10:50 AM

## 2020-05-28 LAB — ECHOCARDIOGRAM COMPLETE
Area-P 1/2: 4.49 cm2
S' Lateral: 2.9 cm

## 2020-06-03 ENCOUNTER — Other Ambulatory Visit: Payer: Self-pay | Admitting: Hematology

## 2020-06-10 ENCOUNTER — Inpatient Hospital Stay: Payer: BC Managed Care – PPO

## 2020-06-10 ENCOUNTER — Encounter: Payer: Self-pay | Admitting: *Deleted

## 2020-06-10 ENCOUNTER — Other Ambulatory Visit: Payer: BC Managed Care – PPO

## 2020-06-10 ENCOUNTER — Other Ambulatory Visit: Payer: Self-pay

## 2020-06-10 ENCOUNTER — Ambulatory Visit: Payer: BC Managed Care – PPO | Admitting: Hematology

## 2020-06-10 VITALS — BP 117/72 | HR 77 | Temp 98.5°F | Resp 20 | Wt 123.8 lb

## 2020-06-10 DIAGNOSIS — C170 Malignant neoplasm of duodenum: Secondary | ICD-10-CM | POA: Diagnosis not present

## 2020-06-10 DIAGNOSIS — C50212 Malignant neoplasm of upper-inner quadrant of left female breast: Secondary | ICD-10-CM | POA: Diagnosis not present

## 2020-06-10 DIAGNOSIS — R251 Tremor, unspecified: Secondary | ICD-10-CM

## 2020-06-10 DIAGNOSIS — Z79810 Long term (current) use of selective estrogen receptor modulators (SERMs): Secondary | ICD-10-CM | POA: Diagnosis not present

## 2020-06-10 DIAGNOSIS — Z9221 Personal history of antineoplastic chemotherapy: Secondary | ICD-10-CM | POA: Diagnosis not present

## 2020-06-10 DIAGNOSIS — M810 Age-related osteoporosis without current pathological fracture: Secondary | ICD-10-CM | POA: Diagnosis not present

## 2020-06-10 DIAGNOSIS — Z170 Estrogen receptor positive status [ER+]: Secondary | ICD-10-CM

## 2020-06-10 DIAGNOSIS — Z923 Personal history of irradiation: Secondary | ICD-10-CM | POA: Diagnosis not present

## 2020-06-10 LAB — CBC WITH DIFFERENTIAL (CANCER CENTER ONLY)
Abs Immature Granulocytes: 0.01 10*3/uL (ref 0.00–0.07)
Basophils Absolute: 0.1 10*3/uL (ref 0.0–0.1)
Basophils Relative: 1 %
Eosinophils Absolute: 0.2 10*3/uL (ref 0.0–0.5)
Eosinophils Relative: 4 %
HCT: 35.4 % — ABNORMAL LOW (ref 36.0–46.0)
Hemoglobin: 11.9 g/dL — ABNORMAL LOW (ref 12.0–15.0)
Immature Granulocytes: 0 %
Lymphocytes Relative: 39 %
Lymphs Abs: 2.4 10*3/uL (ref 0.7–4.0)
MCH: 31.3 pg (ref 26.0–34.0)
MCHC: 33.6 g/dL (ref 30.0–36.0)
MCV: 93.2 fL (ref 80.0–100.0)
Monocytes Absolute: 0.5 10*3/uL (ref 0.1–1.0)
Monocytes Relative: 9 %
Neutro Abs: 2.9 10*3/uL (ref 1.7–7.7)
Neutrophils Relative %: 47 %
Platelet Count: 273 10*3/uL (ref 150–400)
RBC: 3.8 MIL/uL — ABNORMAL LOW (ref 3.87–5.11)
RDW: 12.9 % (ref 11.5–15.5)
WBC Count: 6.1 10*3/uL (ref 4.0–10.5)
nRBC: 0 % (ref 0.0–0.2)

## 2020-06-10 LAB — CMP (CANCER CENTER ONLY)
ALT: 14 U/L (ref 0–44)
AST: 16 U/L (ref 15–41)
Albumin: 3.8 g/dL (ref 3.5–5.0)
Alkaline Phosphatase: 32 U/L — ABNORMAL LOW (ref 38–126)
Anion gap: 8 (ref 5–15)
BUN: 16 mg/dL (ref 6–20)
CO2: 26 mmol/L (ref 22–32)
Calcium: 9.3 mg/dL (ref 8.9–10.3)
Chloride: 109 mmol/L (ref 98–111)
Creatinine: 0.72 mg/dL (ref 0.44–1.00)
GFR, Estimated: >60 mL/min (ref >60)
Glucose, Bld: 85 mg/dL (ref 70–99)
Potassium: 3.7 mmol/L (ref 3.5–5.1)
Sodium: 143 mmol/L (ref 135–145)
Total Bilirubin: 0.3 mg/dL (ref 0.3–1.2)
Total Protein: 6.7 g/dL (ref 6.5–8.1)

## 2020-06-10 LAB — TSH: TSH: 0.79 u[IU]/mL (ref 0.308–3.960)

## 2020-06-10 MED ORDER — ACETAMINOPHEN 325 MG PO TABS
650.0000 mg | ORAL_TABLET | Freq: Once | ORAL | Status: AC
  Administered 2020-06-10: 650 mg via ORAL

## 2020-06-10 MED ORDER — DIPHENHYDRAMINE HCL 25 MG PO CAPS
ORAL_CAPSULE | ORAL | Status: AC
  Filled 2020-06-10: qty 2

## 2020-06-10 MED ORDER — SODIUM CHLORIDE 0.9 % IV SOLN
Freq: Once | INTRAVENOUS | Status: AC
  Filled 2020-06-10: qty 250

## 2020-06-10 MED ORDER — DIPHENHYDRAMINE HCL 25 MG PO CAPS
50.0000 mg | ORAL_CAPSULE | Freq: Once | ORAL | Status: AC
  Administered 2020-06-10: 50 mg via ORAL

## 2020-06-10 MED ORDER — ACETAMINOPHEN 325 MG PO TABS
ORAL_TABLET | ORAL | Status: AC
  Filled 2020-06-10: qty 2

## 2020-06-10 MED ORDER — TRASTUZUMAB-DKST CHEMO 150 MG IV SOLR
6.0000 mg/kg | Freq: Once | INTRAVENOUS | Status: AC
  Administered 2020-06-10: 336 mg via INTRAVENOUS
  Filled 2020-06-10: qty 16

## 2020-06-10 NOTE — Progress Notes (Signed)
Pt. stable for discharge. Left via ambulation, no respiratory distress noted. 

## 2020-06-10 NOTE — Patient Instructions (Signed)
[facility] Discharge Instructions for Patients Receiving Chemotherapy  Today you received the following immunotherapy agent: Trastuzumab  To help prevent nausea and vomiting after your treatment, we encourage you to take your nausea medication as directed by your MD.   If you develop nausea and vomiting that is not controlled by your nausea medication, call the clinic.   BELOW ARE SYMPTOMS THAT SHOULD BE REPORTED IMMEDIATELY:  *FEVER GREATER THAN 100.5 F  *CHILLS WITH OR WITHOUT FEVER  NAUSEA AND VOMITING THAT IS NOT CONTROLLED WITH YOUR NAUSEA MEDICATION  *UNUSUAL SHORTNESS OF BREATH  *UNUSUAL BRUISING OR BLEEDING  TENDERNESS IN MOUTH AND THROAT WITH OR WITHOUT PRESENCE OF ULCERS  *URINARY PROBLEMS  *BOWEL PROBLEMS  UNUSUAL RASH Items with * indicate a potential emergency and should be followed up as soon as possible.  Feel free to call the clinic should you have any questions or concerns. The clinic phone number is [phone].  Please show the CHEMO ALERT CARD at check-in to the Emergency Department and triage nurse.   

## 2020-06-17 ENCOUNTER — Other Ambulatory Visit: Payer: Self-pay | Admitting: Hematology

## 2020-06-17 DIAGNOSIS — G620 Drug-induced polyneuropathy: Secondary | ICD-10-CM

## 2020-06-17 DIAGNOSIS — T451X5A Adverse effect of antineoplastic and immunosuppressive drugs, initial encounter: Secondary | ICD-10-CM

## 2020-06-17 NOTE — Telephone Encounter (Signed)
I spoke with Chelsea Hobbs she is taking gabapentin 200mg -300mg  nightly.

## 2020-07-22 ENCOUNTER — Telehealth: Payer: Self-pay | Admitting: Hematology

## 2020-07-22 NOTE — Telephone Encounter (Signed)
Called pt per 1/7 sch msg to reschedule appt - no answer and vmail full / unable to leave message.

## 2020-08-16 NOTE — Progress Notes (Signed)
Chelsea Hobbs   [phone] [phone]   Clinic Follow up Note   Patient Care Team: Wenda Low, MD as PCP - General (Internal Medicine) Rockwell Germany, RN as Oncology Nurse Navigator Mauro Kaufmann, RN as Oncology Nurse Navigator Erroll Luna, MD as Consulting Physician (General Surgery) Truitt Merle, MD as Consulting Physician (Hematology) Gery Pray, MD as Consulting Physician (Radiation Oncology) Alla Feeling, NP as Nurse Practitioner (Nurse Practitioner)  Date of Service:  08/19/2020  CHIEF COMPLAINT: F/u of left breast cancer  SUMMARY OF ONCOLOGIC HISTORY: Oncology History Overview Note  Cancer Staging Malignant neoplasm of upper-inner quadrant of left breast in female, estrogen receptor positive ([place]) Staging form: Breast, AJCC 8th Edition - Clinical stage from 03/22/2019: Stage IA (cT1c, cN0, cM0, G3, ER+, PR+, HER2+) - Signed by Truitt Merle, MD on 03/22/2019    Malignant neoplasm of upper-inner quadrant of left breast in female, estrogen receptor positive ([place])  03/10/2019 Mammogram   Diagnostic Mammogram 03/10/19  IMPRESSION: 1. Suspicious mass in the 10 o'clock position of the left breast, 3cm from the nipple measuring 12x7x11m. Biopsy is indicated. 2. Indeterminate mass in the 5:30 o'clock position of the left breast, 5cm from the nipple, measuring 9x6x773m Although this may reflect a fibroadenoma, tissue sampling is recommended.   03/16/2019 Initial Biopsy   Diagnosis 03/16/19  1. 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 SEE NOTE 2. Breast, left, needle core biopsy, 5:30 o'clock - FIBROADENOMA   03/16/2019 Receptors her2   Results: IMMUNOHISTOCHEMICAL AND MORPHOMETRIC ANALYSIS PERFORMED MANUALLY The tumor cells are POSITIVE for Her2 (3+). Estrogen Receptor: 100%, POSITIVE, STRONG STAINING INTENSITY Progesterone Receptor: 100%, POSITIVE, STRONG STAINING INTENSITY Proliferation Marker  Ki67: 60%   03/21/2019 Initial Diagnosis   Malignant neoplasm of upper-inner quadrant of left breast in female, estrogen receptor positive ([facility]  03/22/2019 Cancer Staging   Staging form: Breast, AJCC 8th Edition - Clinical stage from 03/22/2019: Stage IA (cT1c, cN0, cM0, G3, ER+, PR+, HER2+) - Signed by FeTruitt MerleMD on 03/22/2019   03/31/2019 Genetic Testing   VUS in CTNNA1 called [id number]>T (Intronic) was identified on the [facility] Common Hereditary Cancers Panel. The Common Hereditary Cancers Panel offered by [facility] includes sequencing and/or deletion duplication testing of the following 47 genes: APC, ATM, AXIN2, BARD1, BMPR1A, BRCA1, BRCA2, BRIP1, CDH1, CDKN2A (p14ARF), CDKN2A (p16INK4a), CKD4, CHEK2, CTNNA1, DICER1, EPCAM (Deletion/duplication testing only), GREM1 (promoter region deletion/duplication testing only), KIT, MEN1, MLH1, MSH2, MSH3, MSH6, MUTYH, NBN, NF1, NHTL1, PALB2, PDGFRA, PMS2, POLD1, POLE, PTEN, RAD50, RAD51C, RAD51D, SDHB, SDHC, SDHD, SMAD4, SMARCA4. STK11, TP53, TSC1, TSC2, and VHL.  The following genes were evaluated for sequence changes only: SDHA and HOXB13 c.251G>A variant only. The report date is 03/30/2019.    03/31/2019 Breast MRI   MRI breast 03/31/19  IMPRESSION: 1. 5 millimeter enhancing oval mass in the anteromedial aspect of the RIGHT breast warranting further evaluation. Given its anterior depth and visibility on noncontrast images, ultrasound is recommended to determine if there is a sonographic correlate. If no sonographic correlate is identified to guide biopsy, MRI biopsy of the RIGHT breast is recommended. 2. Known malignancy in the UPPER INNER QUADRANT of the LEFT breast measuring 1.0 centimeters. 3. 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   04/04/2019 Surgery   RADIOCATIVE SEED GUIDED LEFT BREAST LUMPECTOMY, LEFT AXILLARY SENTINEL LYMPH NODE BIOPSY and PAC placement  By Dr. CoBrantley Stage9/22/20    04/04/2019  Pathology  Results   DIAGNOSIS: 04/04/19  A. BREAST, LEFT, LUMPECTOMY:  -  Invasive ductal carcinoma, Nottingham grade 3 of 3, 1.3 cm  -  Ductal carcinoma in-situ, intermediate grade  -  Margins uninvolved by carcinoma (0.1 cm; posterior margin; see  comment)  -  Previous biopsy site changes present  -  See oncology table and comment below   B. BREAST, LEFT, ADDITIONAL MEDIAL MARGIN, EXCISION:  -  Fibrocystic changes  -  No residual carcinoma identified   C. LYMPH NODE, LEFT AXILLARY, SENTINEL, BIOPSY:  -   No carcinoma identified in one lymph node (0/1)    04/04/2019 Cancer Staging   Staging form: Breast, AJCC 8th Edition - Pathologic stage from 04/04/2019: Stage IA (pT1c, pN0, cM0, G3, ER+, PR+, HER2+) - Signed by Truitt Merle, MD on 04/21/2019   04/11/2019 Pathology Results   Diagnosis Breast, right, needle core biopsy, 2 o'clock - SMALL INTRADUCTAL PAPILLOMA. SEE NOTE - SMALL FIBROADENOMA - NEGATIVE FOR CARCINOMA   05/12/2019 - 06/10/2020 Chemotherapy   Adjuvant Weekly Taxol and Herceptin for 12 weeks starting 05/12/19, switched to Abraxane on 08/04/19 due to skin rash. Completed on 08/11/19.  Followed by maintenance Herceptin q3weeks to complete 1 year treatment (from 05/12/19) starting 09/01/19-06/10/20   08/29/2019 - 09/26/2019 Radiation Therapy   Adjuvant radiation per Dr. Sondra Come    10/12/2019 -  Anti-estrogen oral therapy   Tamoxifen 81m daily starting 10/12/19   11/27/2019 Survivorship   SCP delivered by LCira Rue NP    03/15/2020 Mammogram   IMPRESSION: 1. Expected surgical changes at the lumpectomy site in the central posterior left breast.   2. No suspicious changes at the site of the biopsied papilloma in the lower inner retroareolar right breast (ribbon clip). Surgical excision is planned for this lesion.   3.  No evidence of malignancy in the bilateral breasts.   04/17/2020 Surgery   RIGHT BREAST LUMPECTOMY WITH RADIOACTIVE SEED LOCALIZATION and PAC removal by Dr  CBrantley Stage  FINAL MICROSCOPIC DIAGNOSIS:   A. BREAST, RIGHT, LUMPECTOMY:  - Biopsy clip.  Fibroadenoma.  Fibroadenomatoid change.  Fibrocystic  change.        CURRENT THERAPY:  Zometa every 6 months starting5/17/21 Tamoxifen 232mdaily starting 10/12/19. Start Zoladex injection monthly next week.   INTERVAL HISTORY:  Chelsea Portoeal is here for a follow up of left breast cancer. She presents to the clinic alone. She notes she is doing well. She notes her energy has improved and now 80% back to baseline. She is still able to work. She notes her periods are back.  She notes her Gabapentin has helped her neuropathy and shaking. She feels her muscles are not very strong which impacted her fine motor skills while working. She went to PT before for her legs at the time. She denies tingling or pain in her hands. She is willing to stop Gabapentin. I reviewed her medication list with her. She is on Effexor at night and Xanax about 1 a week if needed.   REVIEW OF SYSTEMS:   Constitutional: Denies fevers, chills or abnormal weight loss Eyes: Denies blurriness of vision Ears, nose, mouth, throat, and face: Denies mucositis or sore throat Respiratory: Denies cough, dyspnea or wheezes Cardiovascular: Denies palpitation, chest discomfort or lower extremity swelling Gastrointestinal:  Denies nausea, heartburn or change in bowel habits Skin: Denies abnormal skin rashes MSK: (+) Muscle weakness in hands Lymphatics: Denies new lymphadenopathy or easy bruising Neurological:Denies numbness, tingling or new weaknesses Behavioral/Psych: Mood is  stable, no new changes  All other systems were reviewed with the patient and are negative.  MEDICAL HISTORY:  Past Medical History:  Diagnosis Date  . Asthma   . Family history of breast cancer   . Family history of lung cancer   . Personal history of chemotherapy   . Personal history of radiation therapy     SURGICAL HISTORY: Past Surgical History:   Procedure Laterality Date  . BREAST BIOPSY Right   . BREAST LUMPECTOMY Left 04/04/2019  . BREAST LUMPECTOMY WITH RADIOACTIVE SEED AND SENTINEL LYMPH NODE BIOPSY Left 04/04/2019   Procedure: RADIOCATIVE SEED GUIDED LEFT BREAST LUMPECTOMY, LEFT AXILLARY SENTINEL LYMPH NODE BIOPSY;  Surgeon: Erroll Luna, MD;  Location: [place];  Service: General;  Laterality: Left;  . BREAST LUMPECTOMY WITH RADIOACTIVE SEED LOCALIZATION Right 04/17/2020   Procedure: RIGHT BREAST LUMPECTOMY WITH RADIOACTIVE SEED LOCALIZATION;  Surgeon: Erroll Luna, MD;  Location: [place];  Service: General;  Laterality: Right;  . NASAL SINUS SURGERY    . PORT-A-CATH REMOVAL Right 04/17/2020   Procedure: REMOVAL PORT-A-CATH;  Surgeon: Erroll Luna, MD;  Location: [facility];  Service: General;  Laterality: Right;  . PORTACATH PLACEMENT N/A 04/04/2019   Procedure: INSERTION PORT-A-CATH WITH ULTRASOUND;  Surgeon: Erroll Luna, MD;  Location: [place];  Service: General;  Laterality: N/A;    I have reviewed the social history and family history with the patient and they are unchanged from previous note.  ALLERGIES:  is allergic to doxycycline and latex.  MEDICATIONS:  Current Outpatient Medications  Medication Sig Dispense Refill  . albuterol (PROVENTIL HFA;VENTOLIN HFA) 108 (90 BASE) MCG/ACT inhaler Inhale 2 puffs into the lungs every 6 (six) hours as needed. For shortness of breath    . ALPRAZolam (XANAX) 1 MG tablet Take 0.5 mg by mouth 2 (two) times daily as needed.    . Ascorbic Acid (VITAMIN C) 1000 MG tablet Take 1,000 mg by mouth daily.    . cholecalciferol (VITAMIN D3) 25 MCG (1000 UT) tablet Take 2,000 Units by mouth daily.     Marland Kitchen doxycycline (VIBRA-TABS) 100 MG tablet Take 1 tablet (100 mg total) by mouth 2 (two) times daily. 14 tablet 0  . fexofenadine (ALLEGRA) 60 MG tablet Take 60 mg by mouth 2 (two) times daily.    . fluticasone (FLONASE)  50 MCG/ACT nasal spray Place into both nostrils daily.    . SUMAtriptan (IMITREX) 100 MG tablet Take 100 mg by mouth.    . tamoxifen (NOLVADEX) 20 MG tablet Take 1 tablet (20 mg total) by mouth daily. 90 tablet 1  . venlafaxine XR (EFFEXOR-XR) 37.5 MG 24 hr capsule TAKE 1 CAPSULE BY MOUTH DAILY WITH BREAKFAST. 90 capsule 1   No current facility-administered medications for this visit.    PHYSICAL EXAMINATION: ECOG PERFORMANCE STATUS: 0 - Asymptomatic  Vitals:   08/19/20 0942  BP: 137/76  Pulse: 89  Resp: 15  Temp: 97.9 F (36.6 C)  SpO2: 100%   Filed Weights   08/19/20 0942  Weight: 129 lb 4.8 oz (58.7 kg)    GENERAL:alert, no distress and comfortable SKIN: skin color, texture, turgor are normal, no rashes or significant lesions EYES: normal, Conjunctiva are pink and non-injected, sclera clear  NECK: supple, thyroid normal size, non-tender, without nodularity LYMPH:  no palpable lymphadenopathy in the cervical, axillary  LUNGS: clear to auscultation and percussion with normal breathing effort HEART: regular rate & rhythm and no murmurs and no lower  extremity edema ABDOMEN:abdomen soft, non-tender and normal bowel sounds Musculoskeletal:no cyanosis of digits and no clubbing  NEURO: alert & oriented x 3 with fluent speech, no focal motor/sensory deficits BREAST: s/p b/l lumpectomy: Surgical incisions healed well with minimal scar tissue. No palpable mass, nodules or adenopathy bilaterally. Breast exam benign.   LABORATORY DATA:  I have reviewed the data as listed CBC Latest Ref Rng & Units 08/19/2020 06/10/2020 05/20/2020  WBC 4.0 - 10.5 K/uL 6.3 6.1 5.8  Hemoglobin 12.0 - 15.0 g/dL 13.3 11.9(L) 12.0  Hematocrit 36.0 - 46.0 % 38.8 35.4(L) 35.1(L)  Platelets 150 - 400 K/uL 300 273 279     CMP Latest Ref Rng & Units 08/19/2020 06/10/2020 05/20/2020  Glucose 70 - 99 mg/dL 86 85 96  BUN 6 - 20 mg/dL '13 16 19  ' Creatinine 0.44 - 1.00 mg/dL 0.77 0.72 0.77  Sodium 135 - 145 mmol/L  140 143 139  Potassium 3.5 - 5.1 mmol/L 4.6 3.7 3.6  Chloride 98 - 111 mmol/L 104 109 105  CO2 22 - 32 mmol/L '28 26 27  ' Calcium 8.9 - 10.3 mg/dL 9.1 9.3 8.9  Total Protein 6.5 - 8.1 g/dL 7.2 6.7 6.8  Total Bilirubin 0.3 - 1.2 mg/dL 0.4 0.3 0.5  Alkaline Phos 38 - 126 U/L 33(L) 32(L) 32(L)  AST 15 - 41 U/L '20 16 21  ' ALT 0 - 44 U/L '17 14 24      ' RADIOGRAPHIC STUDIES: I have personally reviewed the radiological images as listed and agreed with the findings in the report. No results found.   ASSESSMENT & PLAN:  Chelsea Hobbs is a 50 y.o. female with    1.Malignant neoplasm of upper-inner quadrant of left breast, StageIA,p(T1cN0M0), ER+/PR+, HER2+, GradeIII -She was diagnosed in 03/2019. She underwent left breastlumpectomyand SLNB on 04/04/19.Her DCIS margins were close (69m). -She completed adjuvant Taxol/Herceptin including maintenance Herceptin and adjuvant Radiation.  -Given her ER/PR positive disease and premenopausal status, I started her on Tamoxifen in 10/2019.Tolerating well withsomeanxiety and hot flashes. -She underwent right  Breast lumpectomy to remove her benign fibroadenoma on 04/17/20.  -She notes her periods have returned as she is premenopausal. I discussed waiting for her natural menopause in the next few years. I also discussed the option of ovarian suppression with Zoladex injections and switching to AI. We reviewed her benefit of ovarian suppression and it's side effects. I discussed if she cannot tolerate AI, I would switch her back to Tamoxifen and stop ovarian suppression. She is interested in trying Injections with AI. Plan to start soon.  -She is clinically doing well. Lab reviewed, her CBC and CMP are within normal limits. Her physical exam was unremarkable. There is no clinical concern for recurrence. -Continue surveillance. Start MRI breast in 09/2020 and Mammogram in 03/2021.  -Continue Tamoxifen until we switch her to AI.  -F/u in 5 weeks with second  injection  2.Osteoporosis, Hypocalcemia -Per pt when she had her IUD removedin 03/2019. Herrecent hormonal test showed FLampasaslevel was 13.5 (09/12/19). Her last period was in 03/2019 before chemo. Will monitor.  -Per patient her recent2021DEXA showed early osteoporosis of her hip. -I started her on Zometa infusions q627monthfor 2 years on 11/27/19. -Due to recenthypocalcemia I encouraged her to increase oral calcium to TID.  -Will monitor on AI. We reviewed negative impact on her bone density from AI   3. B/l Hand weakness/Tremors -She has b/l hand weakness since completing chemo and shaking occasionally.  -Has improved with massages  and PT, but persists in her hands.  -Gabapentin TID has helped as well. Given she has no more tingling, she can come off Gabapentin. She is willing to try.  -She still feels her strength in her fingers is low which is impacting her dexterity at work. I discussed returning to PT to help. She is agreeable.   4.Insomnia, Anxiety, hot flashes  -She has known anxiety and on Xanax nightly. Given mood changes on Tamoxifen, Istarted her onlow dose Effexor37.76m daily(01/12/20)and if working she can decrease use of Xanax. -Her Hot flashes and Irritability improved on Effexor, continue. Mood stable and hot flashes manageable.   5. Genetic Testingwas negative for pathogenetic nutations  6. H/o COVID19 (+) in 07/2019, Recovered well.She has received both her COVID19 vaccines.  -Given she completed chemo this year and in a high risk job, I suggest she proceed with COVID19 booster. She is interested.  PLAN: -Send PT referral for hand weakness  -Continue Tamoxifen  -Zoladex injection in 1 week -Lab, f/u and Zoladex in 5 weeks, plan to switch Tamoxifen to anastrozole next time    No problem-specific Assessment & Plan notes found for this encounter.   No orders of the defined types were placed in this encounter.  All questions were answered. The  patient knows to call the clinic with any problems, questions or concerns. No barriers to learning was detected. The total time spent in the appointment was 30 minutes.     YTruitt Merle MD 08/19/2020   I, AJoslyn Devon am acting as scribe for YTruitt Merle MD.   I have reviewed the above documentation for accuracy and completeness, and I agree with the above.

## 2020-08-19 ENCOUNTER — Telehealth: Payer: Self-pay | Admitting: Hematology

## 2020-08-19 ENCOUNTER — Other Ambulatory Visit: Payer: Self-pay

## 2020-08-19 ENCOUNTER — Inpatient Hospital Stay (HOSPITAL_BASED_OUTPATIENT_CLINIC_OR_DEPARTMENT_OTHER): Payer: BC Managed Care – PPO | Admitting: Hematology

## 2020-08-19 ENCOUNTER — Inpatient Hospital Stay: Payer: BC Managed Care – PPO | Attending: Hematology

## 2020-08-19 ENCOUNTER — Encounter: Payer: Self-pay | Admitting: Hematology

## 2020-08-19 VITALS — BP 137/76 | HR 89 | Temp 97.9°F | Resp 15 | Ht 62.0 in | Wt 129.3 lb

## 2020-08-19 DIAGNOSIS — C50212 Malignant neoplasm of upper-inner quadrant of left female breast: Secondary | ICD-10-CM

## 2020-08-19 DIAGNOSIS — Z170 Estrogen receptor positive status [ER+]: Secondary | ICD-10-CM | POA: Insufficient documentation

## 2020-08-19 DIAGNOSIS — Z923 Personal history of irradiation: Secondary | ICD-10-CM | POA: Insufficient documentation

## 2020-08-19 DIAGNOSIS — Z79810 Long term (current) use of selective estrogen receptor modulators (SERMs): Secondary | ICD-10-CM | POA: Insufficient documentation

## 2020-08-19 DIAGNOSIS — Z79899 Other long term (current) drug therapy: Secondary | ICD-10-CM | POA: Diagnosis not present

## 2020-08-19 DIAGNOSIS — Z8616 Personal history of COVID-19: Secondary | ICD-10-CM | POA: Diagnosis not present

## 2020-08-19 DIAGNOSIS — M810 Age-related osteoporosis without current pathological fracture: Secondary | ICD-10-CM | POA: Diagnosis not present

## 2020-08-19 DIAGNOSIS — E559 Vitamin D deficiency, unspecified: Secondary | ICD-10-CM

## 2020-08-19 LAB — CMP (CANCER CENTER ONLY)
ALT: 17 U/L (ref 0–44)
AST: 20 U/L (ref 15–41)
Albumin: 4.1 g/dL (ref 3.5–5.0)
Alkaline Phosphatase: 33 U/L — ABNORMAL LOW (ref 38–126)
Anion gap: 8 (ref 5–15)
BUN: 13 mg/dL (ref 6–20)
CO2: 28 mmol/L (ref 22–32)
Calcium: 9.1 mg/dL (ref 8.9–10.3)
Chloride: 104 mmol/L (ref 98–111)
Creatinine: 0.77 mg/dL (ref 0.44–1.00)
GFR, Estimated: >60 mL/min (ref >60)
Glucose, Bld: 86 mg/dL (ref 70–99)
Potassium: 4.6 mmol/L (ref 3.5–5.1)
Sodium: 140 mmol/L (ref 135–145)
Total Bilirubin: 0.4 mg/dL (ref 0.3–1.2)
Total Protein: 7.2 g/dL (ref 6.5–8.1)

## 2020-08-19 LAB — CBC WITH DIFFERENTIAL (CANCER CENTER ONLY)
Abs Immature Granulocytes: 0.01 10*3/uL (ref 0.00–0.07)
Basophils Absolute: 0.1 10*3/uL (ref 0.0–0.1)
Basophils Relative: 1 %
Eosinophils Absolute: 0.5 10*3/uL (ref 0.0–0.5)
Eosinophils Relative: 7 %
HCT: 38.8 % (ref 36.0–46.0)
Hemoglobin: 13.3 g/dL (ref 12.0–15.0)
Immature Granulocytes: 0 %
Lymphocytes Relative: 44 %
Lymphs Abs: 2.7 10*3/uL (ref 0.7–4.0)
MCH: 31.6 pg (ref 26.0–34.0)
MCHC: 34.3 g/dL (ref 30.0–36.0)
MCV: 92.2 fL (ref 80.0–100.0)
Monocytes Absolute: 0.5 10*3/uL (ref 0.1–1.0)
Monocytes Relative: 8 %
Neutro Abs: 2.5 10*3/uL (ref 1.7–7.7)
Neutrophils Relative %: 40 %
Platelet Count: 300 10*3/uL (ref 150–400)
RBC: 4.21 MIL/uL (ref 3.87–5.11)
RDW: 12.3 % (ref 11.5–15.5)
WBC Count: 6.3 10*3/uL (ref 4.0–10.5)
nRBC: 0 % (ref 0.0–0.2)

## 2020-08-19 LAB — VITAMIN D 25 HYDROXY (VIT D DEFICIENCY, FRACTURES): Vit D, 25-Hydroxy: 103.59 ng/mL — ABNORMAL HIGH (ref 30–100)

## 2020-08-19 MED ORDER — ACETAMINOPHEN 325 MG PO TABS
ORAL_TABLET | ORAL | Status: AC
  Filled 2020-08-19: qty 2

## 2020-08-19 MED ORDER — ONDANSETRON HCL 8 MG PO TABS
ORAL_TABLET | ORAL | Status: AC
  Filled 2020-08-19: qty 1

## 2020-08-19 MED ORDER — DIPHENHYDRAMINE HCL 25 MG PO CAPS
ORAL_CAPSULE | ORAL | Status: AC
  Filled 2020-08-19: qty 2

## 2020-08-19 NOTE — Telephone Encounter (Signed)
Scheduled follow-up appointments per 2/7 los. Patient is aware. °

## 2020-08-20 ENCOUNTER — Encounter: Payer: Self-pay | Admitting: Hematology

## 2020-08-21 ENCOUNTER — Ambulatory Visit: Payer: BC Managed Care – PPO | Admitting: Hematology

## 2020-08-21 ENCOUNTER — Other Ambulatory Visit: Payer: BC Managed Care – PPO

## 2020-08-26 ENCOUNTER — Other Ambulatory Visit: Payer: Self-pay

## 2020-08-26 ENCOUNTER — Inpatient Hospital Stay: Payer: BC Managed Care – PPO

## 2020-08-26 VITALS — BP 129/74 | HR 75 | Resp 18

## 2020-08-26 DIAGNOSIS — C50212 Malignant neoplasm of upper-inner quadrant of left female breast: Secondary | ICD-10-CM | POA: Diagnosis not present

## 2020-08-26 DIAGNOSIS — Z95828 Presence of other vascular implants and grafts: Secondary | ICD-10-CM

## 2020-08-26 DIAGNOSIS — M810 Age-related osteoporosis without current pathological fracture: Secondary | ICD-10-CM | POA: Diagnosis not present

## 2020-08-26 DIAGNOSIS — Z170 Estrogen receptor positive status [ER+]: Secondary | ICD-10-CM | POA: Diagnosis not present

## 2020-08-26 DIAGNOSIS — Z8616 Personal history of COVID-19: Secondary | ICD-10-CM | POA: Diagnosis not present

## 2020-08-26 DIAGNOSIS — Z923 Personal history of irradiation: Secondary | ICD-10-CM | POA: Diagnosis not present

## 2020-08-26 DIAGNOSIS — Z79810 Long term (current) use of selective estrogen receptor modulators (SERMs): Secondary | ICD-10-CM | POA: Diagnosis not present

## 2020-08-26 DIAGNOSIS — Z79899 Other long term (current) drug therapy: Secondary | ICD-10-CM | POA: Diagnosis not present

## 2020-08-26 MED ORDER — GOSERELIN ACETATE 3.6 MG ~~LOC~~ IMPL
3.6000 mg | DRUG_IMPLANT | Freq: Once | SUBCUTANEOUS | Status: AC
  Administered 2020-08-26: 3.6 mg via SUBCUTANEOUS

## 2020-08-26 MED ORDER — GOSERELIN ACETATE 3.6 MG ~~LOC~~ IMPL
DRUG_IMPLANT | SUBCUTANEOUS | Status: AC
  Filled 2020-08-26: qty 3.6

## 2020-08-26 NOTE — Patient Instructions (Signed)

## 2020-09-20 NOTE — Progress Notes (Signed)
[place]   [phone] [phone]   Clinic Follow up Note   Patient Care Team: Wenda Low, MD as PCP - General (Internal Medicine) Rockwell Germany, RN as Oncology Nurse Navigator Mauro Kaufmann, RN as Oncology Nurse Navigator Erroll Luna, MD as Consulting Physician (General Surgery) Truitt Merle, MD as Consulting Physician (Hematology) Gery Pray, MD as Consulting Physician (Radiation Oncology) Alla Feeling, NP as Nurse Practitioner (Nurse Practitioner)  Date of Service:  09/23/2020  CHIEF COMPLAINT: F/u of left breast cancer  SUMMARY OF ONCOLOGIC HISTORY: Oncology History Overview Note  Cancer Staging Malignant neoplasm of upper-inner quadrant of left breast in female, estrogen receptor positive ([place]) Staging form: Breast, AJCC 8th Edition - Clinical stage from 03/22/2019: Stage IA (cT1c, cN0, cM0, G3, ER+, PR+, HER2+) - Signed by Truitt Merle, MD on 03/22/2019    Malignant neoplasm of upper-inner quadrant of left breast in female, estrogen receptor positive ([place])  03/10/2019 Mammogram   Diagnostic Mammogram 03/10/19  IMPRESSION: 1. Suspicious mass in the 10 o'clock position of the left breast, 3cm from the nipple measuring 12x7x27m. Biopsy is indicated. 2. Indeterminate mass in the 5:30 o'clock position of the left breast, 5cm from the nipple, measuring 9x6x781m Although this may reflect a fibroadenoma, tissue sampling is recommended.   03/16/2019 Initial Biopsy   Diagnosis 03/16/19  1. 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 SEE NOTE 2. Breast, left, needle core biopsy, 5:30 o'clock - FIBROADENOMA   03/16/2019 Receptors her2   Results: IMMUNOHISTOCHEMICAL AND MORPHOMETRIC ANALYSIS PERFORMED MANUALLY The tumor cells are POSITIVE for Her2 (3+). Estrogen Receptor: 100%, POSITIVE, STRONG STAINING INTENSITY Progesterone Receptor: 100%, POSITIVE, STRONG STAINING INTENSITY Proliferation Marker  Ki67: 60%   03/21/2019 Initial Diagnosis   Malignant neoplasm of upper-inner quadrant of left breast in female, estrogen receptor positive ([place]  03/22/2019 Cancer Staging   Staging form: Breast, AJCC 8th Edition - Clinical stage from 03/22/2019: Stage IA (cT1c, cN0, cM0, G3, ER+, PR+, HER2+) - Signed by FeTruitt MerleMD on 03/22/2019   03/31/2019 Genetic Testing   VUS in CTNNA1 called [id number]>T (Intronic) was identified on the [facility] Common Hereditary Cancers Panel. The Common Hereditary Cancers Panel offered by [facility] includes sequencing and/or deletion duplication testing of the following 47 genes: APC, ATM, AXIN2, BARD1, BMPR1A, BRCA1, BRCA2, BRIP1, CDH1, CDKN2A (p14ARF), CDKN2A (p16INK4a), CKD4, CHEK2, CTNNA1, DICER1, EPCAM (Deletion/duplication testing only), GREM1 (promoter region deletion/duplication testing only), KIT, MEN1, MLH1, MSH2, MSH3, MSH6, MUTYH, NBN, NF1, NHTL1, PALB2, PDGFRA, PMS2, POLD1, POLE, PTEN, RAD50, RAD51C, RAD51D, SDHB, SDHC, SDHD, SMAD4, SMARCA4. STK11, TP53, TSC1, TSC2, and VHL.  The following genes were evaluated for sequence changes only: SDHA and HOXB13 c.251G>A variant only. The report date is 03/30/2019.    03/31/2019 Breast MRI   MRI breast 03/31/19  IMPRESSION: 1. 5 millimeter enhancing oval mass in the anteromedial aspect of the RIGHT breast warranting further evaluation. Given its anterior depth and visibility on noncontrast images, ultrasound is recommended to determine if there is a sonographic correlate. If no sonographic correlate is identified to guide biopsy, MRI biopsy of the RIGHT breast is recommended. 2. Known malignancy in the UPPER INNER QUADRANT of the LEFT breast measuring 1.0 centimeters. 3. 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   04/04/2019 Surgery   RADIOCATIVE SEED GUIDED LEFT BREAST LUMPECTOMY, LEFT AXILLARY SENTINEL LYMPH NODE BIOPSY and PAC placement  By Dr. CoBrantley Stage9/22/20    04/04/2019  Pathology  Results   DIAGNOSIS: 04/04/19  A. BREAST, LEFT, LUMPECTOMY:  -  Invasive ductal carcinoma, Nottingham grade 3 of 3, 1.3 cm  -  Ductal carcinoma in-situ, intermediate grade  -  Margins uninvolved by carcinoma (0.1 cm; posterior margin; see  comment)  -  Previous biopsy site changes present  -  See oncology table and comment below   B. BREAST, LEFT, ADDITIONAL MEDIAL MARGIN, EXCISION:  -  Fibrocystic changes  -  No residual carcinoma identified   C. LYMPH NODE, LEFT AXILLARY, SENTINEL, BIOPSY:  -   No carcinoma identified in one lymph node (0/1)    04/04/2019 Cancer Staging   Staging form: Breast, AJCC 8th Edition - Pathologic stage from 04/04/2019: Stage IA (pT1c, pN0, cM0, G3, ER+, PR+, HER2+) - Signed by Truitt Merle, MD on 04/21/2019   04/11/2019 Pathology Results   Diagnosis Breast, right, needle core biopsy, 2 o'clock - SMALL INTRADUCTAL PAPILLOMA. SEE NOTE - SMALL FIBROADENOMA - NEGATIVE FOR CARCINOMA   05/12/2019 - 06/10/2020 Chemotherapy   Adjuvant Weekly Taxol and Herceptin for 12 weeks starting 05/12/19, switched to Abraxane on 08/04/19 due to skin rash. Completed on 08/11/19.  Followed by maintenance Herceptin q3weeks to complete 1 year treatment (from 05/12/19) starting 09/01/19-06/10/20   08/29/2019 - 09/26/2019 Radiation Therapy   Adjuvant radiation per Dr. Sondra Come    10/12/2019 -  Anti-estrogen oral therapy   Tamoxifen 44m daily starting 10/12/19   11/27/2019 Survivorship   SCP delivered by LCira Rue NP    03/15/2020 Mammogram   IMPRESSION: 1. Expected surgical changes at the lumpectomy site in the central posterior left breast.   2. No suspicious changes at the site of the biopsied papilloma in the lower inner retroareolar right breast (ribbon clip). Surgical excision is planned for this lesion.   3.  No evidence of malignancy in the bilateral breasts.   04/17/2020 Surgery   RIGHT BREAST LUMPECTOMY WITH RADIOACTIVE SEED LOCALIZATION and PAC removal by Dr  CBrantley Stage  FINAL MICROSCOPIC DIAGNOSIS:   A. BREAST, RIGHT, LUMPECTOMY:  - Biopsy clip.  Fibroadenoma.  Fibroadenomatoid change.  Fibrocystic  change.        CURRENT THERAPY:  Zometa every 6 months starting5/17/21 Tamoxifen 245mdaily starting 10/12/19. Start Zoladex injection 08/26/20. Switched to Anastrozole in 09/2020  INTERVAL HISTORY:  KiCrescent Gothameal is here for a follow up of left breast cancer. She presents to the clinic alone.  She started Zoladex injection a month ago, and tolerated the first injection very well.  Her mild hot flashes are manageable.  No other noticeable side effects.  Mild neuropathy is also stable, no other new complaints.  All other systems were reviewed with the patient and are negative.  MEDICAL HISTORY:  Past Medical History:  Diagnosis Date  . Asthma   . Family history of breast cancer   . Family history of lung cancer   . Personal history of chemotherapy   . Personal history of radiation therapy     SURGICAL HISTORY: Past Surgical History:  Procedure Laterality Date  . BREAST BIOPSY Right   . BREAST LUMPECTOMY Left 04/04/2019  . BREAST LUMPECTOMY WITH RADIOACTIVE SEED AND SENTINEL LYMPH NODE BIOPSY Left 04/04/2019   Procedure: RADIOCATIVE SEED GUIDED LEFT BREAST LUMPECTOMY, LEFT AXILLARY SENTINEL LYMPH NODE BIOPSY;  Surgeon: CoErroll LunaMD;  [facility] Service: General;  Laterality: Left;  . BREAST LUMPECTOMY WITH RADIOACTIVE SEED LOCALIZATION Right 04/17/2020   Procedure: RIGHT BREAST LUMPECTOMY WITH RADIOACTIVE SEED  LOCALIZATION;  Surgeon: Erroll Luna, MD;  Location: [place];  Service: General;  Laterality: Right;  . NASAL SINUS SURGERY    . PORT-A-CATH REMOVAL Right 04/17/2020   Procedure: REMOVAL PORT-A-CATH;  Surgeon: Erroll Luna, MD;  Location: [place];  Service: General;  Laterality: Right;  . PORTACATH PLACEMENT N/A 04/04/2019   Procedure: INSERTION PORT-A-CATH  WITH ULTRASOUND;  Surgeon: Erroll Luna, MD;  Location: [place];  Service: General;  Laterality: N/A;    I have reviewed the social history and family history with the patient and they are unchanged from previous note.  ALLERGIES:  is allergic to doxycycline and latex.  MEDICATIONS:  Current Outpatient Medications  Medication Sig Dispense Refill  . anastrozole (ARIMIDEX) 1 MG tablet Take 1 tablet (1 mg total) by mouth daily. 30 tablet 3  . albuterol (PROVENTIL HFA;VENTOLIN HFA) 108 (90 BASE) MCG/ACT inhaler Inhale 2 puffs into the lungs every 6 (six) hours as needed. For shortness of breath    . ALPRAZolam (XANAX) 1 MG tablet Take 0.5 mg by mouth 2 (two) times daily as needed.    . Ascorbic Acid (VITAMIN C) 1000 MG tablet Take 1,000 mg by mouth daily.    . cholecalciferol (VITAMIN D3) 25 MCG (1000 UT) tablet Take 2,000 Units by mouth daily.     Marland Kitchen doxycycline (VIBRA-TABS) 100 MG tablet Take 1 tablet (100 mg total) by mouth 2 (two) times daily. 14 tablet 0  . fexofenadine (ALLEGRA) 60 MG tablet Take 60 mg by mouth 2 (two) times daily.    . fluticasone (FLONASE) 50 MCG/ACT nasal spray Place into both nostrils daily.    . SUMAtriptan (IMITREX) 100 MG tablet Take 100 mg by mouth.    . tamoxifen (NOLVADEX) 20 MG tablet Take 1 tablet (20 mg total) by mouth daily. 90 tablet 1  . venlafaxine XR (EFFEXOR-XR) 37.5 MG 24 hr capsule TAKE 1 CAPSULE BY MOUTH DAILY WITH BREAKFAST. 90 capsule 1   No current facility-administered medications for this visit.    PHYSICAL EXAMINATION: ECOG PERFORMANCE STATUS: 0 - Asymptomatic  Vitals:   09/23/20 0757  BP: 115/88  Pulse: 73  Resp: 18  Temp: 97.8 F (36.6 C)  SpO2: 100%   Filed Weights   09/23/20 0757  Weight: 130 lb 14.4 oz (59.4 kg)    GENERAL:alert, no distress and comfortable SKIN: skin color, texture, turgor are normal, no rashes or significant lesions EYES: normal, Conjunctiva are pink and non-injected, sclera  clear Musculoskeletal:no cyanosis of digits and no clubbing  NEURO: alert & oriented x 3 with fluent speech, no focal motor/sensory deficits  LABORATORY DATA:  I have reviewed the data as listed CBC Latest Ref Rng & Units 09/23/2020 08/19/2020 06/10/2020  WBC 4.0 - 10.5 K/uL 7.3 6.3 6.1  Hemoglobin 12.0 - 15.0 g/dL 13.3 13.3 11.9(L)  Hematocrit 36.0 - 46.0 % 39.4 38.8 35.4(L)  Platelets 150 - 400 K/uL 283 300 273     CMP Latest Ref Rng & Units 09/23/2020 08/19/2020 06/10/2020  Glucose 70 - 99 mg/dL 103(H) 86 85  BUN 6 - 20 mg/dL '15 13 16  ' Creatinine 0.44 - 1.00 mg/dL 0.77 0.77 0.72  Sodium 135 - 145 mmol/L 141 140 143  Potassium 3.5 - 5.1 mmol/L 4.4 4.6 3.7  Chloride 98 - 111 mmol/L 105 104 109  CO2 22 - 32 mmol/L '28 28 26  ' Calcium 8.9 - 10.3 mg/dL 8.9 9.1 9.3  Total Protein 6.5 - 8.1 g/dL 7.1 7.2  6.7  Total Bilirubin 0.3 - 1.2 mg/dL 0.4 0.4 0.3  Alkaline Phos 38 - 126 U/L 31(L) 33(L) 32(L)  AST 15 - 41 U/L '27 20 16  ' ALT 0 - 44 U/L '25 17 14      ' RADIOGRAPHIC STUDIES: I have personally reviewed the radiological images as listed and agreed with the findings in the report. No results found.   ASSESSMENT & PLAN:  Chelsea Hobbs is a 50 y.o. female with   1.Malignant neoplasm of upper-inner quadrant of left breast, StageIA,p(T1cN0M0), ER+/PR+, HER2+, GradeIII -She was diagnosed in 03/2019. She underwent left breastlumpectomyand SLNB on 04/04/19.Her DCIS margins were close (33m). -She completedadjuvant Taxol/Herceptin including maintenance Herceptin and adjuvant Radiation.  -Given her ER/PR positive disease and premenopausal status, I started her on Tamoxifen in 10/2019.Tolerating well withsomeanxiety and hot flashes. -She underwent right Breast lumpectomy to remove her benign fibroadenoma on 04/17/20.  -Given she is perimenopausal, I started her on Zoladex injections on 08/26/20.  She tolerated first injection well.  I will switch her Tamoxifen to Anastrozole.  I reviewed  the potential side effects, especially joint stiffness and pain, impact on her bone density etc.  She agrees to proceed. -given her stage I disease, I think the benefit of adjuvant neratinib is very small, and I do not recommend it.  -Continue surveillance. MRI breast in the next month and Mammogram in 03/2021.  -f/u in 3 months   2.Osteoporosis, Hypocalcemia -Per pt when she had her IUD removedin 03/2019. Herrecent hormonal test showed FCarrizozolevel was 13.5 (09/12/19). Her last period was in 03/2019 before chemo. Will monitor.  -Per patient her recent2021DEXA showed early osteoporosis of her hip. -I started her on Zometa infusions q630monthfor 2 years on 11/27/19.next due in May 2022 -Due to recenthypocalcemia I encouraged her to increase oral calcium to TID.  -Will monitor on AI with DEXA every 2 years.   3. B/l Hand weakness/Tremors -She has b/l hand weakness since completing chemo and shaking occasionally.  -Has improved with massages and PT, but persists in her hands.  -Gabapentin TID has helped as well. Given she has no more tingling, she can come off Gabapentin. She is willing to try.  -She still feels her strength in her fingers is low which is impacting her dexterity at work.  -continue exercise   4.Insomnia, Anxiety, hot flashes  -She has known anxiety and on Xanax nightly. Given mood changes on Tamoxifen,Istarted her onlow dose Effexor37.19m419maily(01/12/20)and if working she can decrease use of Xanax. -Mood stable and hot flashes manageable.  5. Genetic Testingwas negative for pathogenetic nutations  6. H/o COVID19 (+) in 07/2019, Recovered well.She has received both her COVID19 vaccines. Booster was recommended.   PLAN: -I called in anastrozole today, she will stop tamoxifen and switch to anastrozole -Lab reviewed, will proceed with Zoladex injection today and continue every 4 weeks -lab and Zometa infusion in 8 weeks -Follow-up in 3 months -breast MRI in the  next month    No problem-specific Assessment & Plan notes found for this encounter.   Orders Placed This Encounter  Procedures  . MR BREAST BILATERAL W WO CONTRAST INC CAD    Standing Status:   Future    Standing Expiration Date:   09/23/2021    Order Specific Question:   If indicated for the ordered procedure, I authorize the administration of contrast media per Radiology protocol    Answer:   Yes    Order Specific Question:   What is the  patient's sedation requirement?    Answer:   No Sedation    Order Specific Question:   Does the patient have a pacemaker or implanted devices?    Answer:   No    Order Specific Question:   Radiology Contrast Protocol - do NOT remove file path    Answer:   [email or web address]    Order Specific Question:   Preferred imaging location?    Answer:   [facility] (table limit-550lbs)   All questions were answered. The patient knows to call the clinic with any problems, questions or concerns. No barriers to learning was detected. The total time spent in the appointment was 30 minutes.     Truitt Merle, MD 09/23/2020   I, Joslyn Devon, am acting as scribe for Truitt Merle, MD.   I have reviewed the above documentation for accuracy and completeness, and I agree with the above.

## 2020-09-23 ENCOUNTER — Inpatient Hospital Stay (HOSPITAL_BASED_OUTPATIENT_CLINIC_OR_DEPARTMENT_OTHER): Payer: BC Managed Care – PPO | Admitting: Hematology

## 2020-09-23 ENCOUNTER — Other Ambulatory Visit: Payer: Self-pay

## 2020-09-23 ENCOUNTER — Inpatient Hospital Stay: Payer: BC Managed Care – PPO

## 2020-09-23 ENCOUNTER — Encounter: Payer: Self-pay | Admitting: Hematology

## 2020-09-23 ENCOUNTER — Inpatient Hospital Stay: Payer: BC Managed Care – PPO | Attending: Hematology

## 2020-09-23 VITALS — BP 115/88 | HR 73 | Temp 97.8°F | Resp 18 | Ht 62.0 in | Wt 130.9 lb

## 2020-09-23 DIAGNOSIS — Z170 Estrogen receptor positive status [ER+]: Secondary | ICD-10-CM | POA: Diagnosis not present

## 2020-09-23 DIAGNOSIS — Z5111 Encounter for antineoplastic chemotherapy: Secondary | ICD-10-CM | POA: Insufficient documentation

## 2020-09-23 DIAGNOSIS — Z79899 Other long term (current) drug therapy: Secondary | ICD-10-CM | POA: Diagnosis not present

## 2020-09-23 DIAGNOSIS — C50212 Malignant neoplasm of upper-inner quadrant of left female breast: Secondary | ICD-10-CM

## 2020-09-23 DIAGNOSIS — Z95828 Presence of other vascular implants and grafts: Secondary | ICD-10-CM

## 2020-09-23 LAB — CMP (CANCER CENTER ONLY)
ALT: 25 U/L (ref 0–44)
AST: 27 U/L (ref 15–41)
Albumin: 4.1 g/dL (ref 3.5–5.0)
Alkaline Phosphatase: 31 U/L — ABNORMAL LOW (ref 38–126)
Anion gap: 8 (ref 5–15)
BUN: 15 mg/dL (ref 6–20)
CO2: 28 mmol/L (ref 22–32)
Calcium: 8.9 mg/dL (ref 8.9–10.3)
Chloride: 105 mmol/L (ref 98–111)
Creatinine: 0.77 mg/dL (ref 0.44–1.00)
GFR, Estimated: >60 mL/min (ref >60)
Glucose, Bld: 103 mg/dL — ABNORMAL HIGH (ref 70–99)
Potassium: 4.4 mmol/L (ref 3.5–5.1)
Sodium: 141 mmol/L (ref 135–145)
Total Bilirubin: 0.4 mg/dL (ref 0.3–1.2)
Total Protein: 7.1 g/dL (ref 6.5–8.1)

## 2020-09-23 LAB — CBC WITH DIFFERENTIAL (CANCER CENTER ONLY)
Abs Immature Granulocytes: 0.02 10*3/uL (ref 0.00–0.07)
Basophils Absolute: 0.1 10*3/uL (ref 0.0–0.1)
Basophils Relative: 1 %
Eosinophils Absolute: 0.5 10*3/uL (ref 0.0–0.5)
Eosinophils Relative: 7 %
HCT: 39.4 % (ref 36.0–46.0)
Hemoglobin: 13.3 g/dL (ref 12.0–15.0)
Immature Granulocytes: 0 %
Lymphocytes Relative: 48 %
Lymphs Abs: 3.5 10*3/uL (ref 0.7–4.0)
MCH: 31 pg (ref 26.0–34.0)
MCHC: 33.8 g/dL (ref 30.0–36.0)
MCV: 91.8 fL (ref 80.0–100.0)
Monocytes Absolute: 0.7 10*3/uL (ref 0.1–1.0)
Monocytes Relative: 10 %
Neutro Abs: 2.5 10*3/uL (ref 1.7–7.7)
Neutrophils Relative %: 34 %
Platelet Count: 283 10*3/uL (ref 150–400)
RBC: 4.29 MIL/uL (ref 3.87–5.11)
RDW: 12 % (ref 11.5–15.5)
WBC Count: 7.3 10*3/uL (ref 4.0–10.5)
nRBC: 0 % (ref 0.0–0.2)

## 2020-09-23 MED ORDER — GOSERELIN ACETATE 3.6 MG ~~LOC~~ IMPL
DRUG_IMPLANT | SUBCUTANEOUS | Status: AC
  Filled 2020-09-23: qty 3.6

## 2020-09-23 MED ORDER — GOSERELIN ACETATE 3.6 MG ~~LOC~~ IMPL
3.6000 mg | DRUG_IMPLANT | Freq: Once | SUBCUTANEOUS | Status: AC
  Administered 2020-09-23: 3.6 mg via SUBCUTANEOUS

## 2020-09-23 MED ORDER — ANASTROZOLE 1 MG PO TABS: 1.0000 mg | ORAL_TABLET | Freq: Every day | ORAL | 3 refills | Status: DC

## 2020-09-23 NOTE — Patient Instructions (Signed)

## 2020-09-24 ENCOUNTER — Telehealth: Payer: Self-pay | Admitting: Hematology

## 2020-09-24 NOTE — Telephone Encounter (Signed)
Left message with follow-up appointments per 3/14 los. Gave option to call back to reschedule if needed. 

## 2020-10-11 DIAGNOSIS — Z853 Personal history of malignant neoplasm of breast: Secondary | ICD-10-CM

## 2020-10-11 HISTORY — DX: Personal history of malignant neoplasm of breast: Z85.3

## 2020-10-18 ENCOUNTER — Ambulatory Visit
Admission: RE | Admit: 2020-10-18 | Discharge: 2020-10-18 | Disposition: A | Discharge status: Home / Self Care | Payer: BC Managed Care – PPO | Source: Ambulatory Visit | Attending: Hematology | Admitting: Hematology

## 2020-10-18 ENCOUNTER — Other Ambulatory Visit: Payer: Self-pay

## 2020-10-18 DIAGNOSIS — N6489 Other specified disorders of breast: Secondary | ICD-10-CM | POA: Diagnosis not present

## 2020-10-18 DIAGNOSIS — C50212 Malignant neoplasm of upper-inner quadrant of left female breast: Secondary | ICD-10-CM

## 2020-10-18 MED ORDER — GADOBUTROL 1 MMOL/ML IV SOLN
6.0000 mL | Freq: Once | INTRAVENOUS | Status: AC | PRN
  Administered 2020-10-18: 6 mL via INTRAVENOUS

## 2020-10-21 ENCOUNTER — Encounter: Payer: Self-pay | Admitting: Hematology

## 2020-10-21 ENCOUNTER — Other Ambulatory Visit: Payer: Self-pay

## 2020-10-21 ENCOUNTER — Inpatient Hospital Stay: Payer: BC Managed Care – PPO | Attending: Hematology

## 2020-10-21 VITALS — BP 110/79 | HR 82 | Temp 98.6°F

## 2020-10-21 DIAGNOSIS — Z170 Estrogen receptor positive status [ER+]: Secondary | ICD-10-CM | POA: Insufficient documentation

## 2020-10-21 DIAGNOSIS — Z79899 Other long term (current) drug therapy: Secondary | ICD-10-CM | POA: Diagnosis not present

## 2020-10-21 DIAGNOSIS — Z5111 Encounter for antineoplastic chemotherapy: Secondary | ICD-10-CM | POA: Insufficient documentation

## 2020-10-21 DIAGNOSIS — Z95828 Presence of other vascular implants and grafts: Secondary | ICD-10-CM

## 2020-10-21 DIAGNOSIS — C50212 Malignant neoplasm of upper-inner quadrant of left female breast: Secondary | ICD-10-CM | POA: Diagnosis not present

## 2020-10-21 MED ORDER — GOSERELIN ACETATE 3.6 MG ~~LOC~~ IMPL
3.6000 mg | DRUG_IMPLANT | Freq: Once | SUBCUTANEOUS | Status: AC
  Administered 2020-10-21: 3.6 mg via SUBCUTANEOUS

## 2020-10-21 MED ORDER — GOSERELIN ACETATE 3.6 MG ~~LOC~~ IMPL
DRUG_IMPLANT | SUBCUTANEOUS | Status: AC
  Filled 2020-10-21: qty 3.6

## 2020-10-21 NOTE — Patient Instructions (Signed)

## 2020-10-22 ENCOUNTER — Telehealth: Payer: Self-pay | Admitting: Nurse Practitioner

## 2020-10-22 ENCOUNTER — Other Ambulatory Visit: Payer: Self-pay | Admitting: Hematology

## 2020-10-22 DIAGNOSIS — R9389 Abnormal findings on diagnostic imaging of other specified body structures: Secondary | ICD-10-CM

## 2020-10-22 NOTE — Telephone Encounter (Signed)
I called and reviewed MRI results with Chelsea Hobbs which shows indeterminate 2x3 cm non mass enhancement in the right lower breast. She has had papilloma and right lumpectomy in the past in addition to her left breast cancer treatment. She is understandably anxious but agreeable to proceed with MRI guided biopsy. We discussed possible findings. I will call her with results. She voiced thoughts that she may want to consider risk reducing b/l mastectomy in the future given multiple biopsies and the current stress level is affecting her quality of life. We will follow her path and will inform her surgical team of her thoughts. She appreciated the call.   [name], NP

## 2020-10-24 ENCOUNTER — Encounter: Payer: Self-pay | Admitting: Hematology

## 2020-10-25 ENCOUNTER — Other Ambulatory Visit: Payer: Self-pay | Admitting: Hematology

## 2020-10-25 MED ORDER — DIPHENHYDRAMINE HCL 50 MG PO TABS: 50.0000 mg | ORAL_TABLET | Freq: Once | ORAL | 0 refills | Status: AC

## 2020-10-25 MED ORDER — PREDNISONE 50 MG PO TABS: ORAL_TABLET | ORAL | 0 refills | Status: DC

## 2020-10-25 MED ORDER — LORAZEPAM 1 MG PO TABS: 1.0000 mg | ORAL_TABLET | Freq: Three times a day (TID) | ORAL | 0 refills | Status: DC

## 2020-10-30 NOTE — Progress Notes (Signed)
[place]   [phone] [phone]   Clinic Follow up Note   Patient Care Team: Wenda Low, MD as PCP - General (Internal Medicine) Rockwell Germany, RN as Oncology Nurse Navigator Mauro Kaufmann, RN as Oncology Nurse Navigator Erroll Luna, MD as Consulting Physician (General Surgery) Truitt Merle, MD as Consulting Physician (Hematology) Gery Pray, MD as Consulting Physician (Radiation Oncology) Alla Feeling, NP as Nurse Practitioner (Nurse Practitioner)   I connected with Chelsea Hobbs on 11/04/2020 at  1:00 PM EDT by telephone visit and verified that I am speaking with the correct person using two identifiers.  I discussed the limitations, risks, security and privacy concerns of performing an evaluation and management service by telephone and the availability of in person appointments. I also discussed with the patient that there may be a patient responsible charge related to this service. The patient expressed understanding and agreed to proceed.   Other persons participating in the visit and their role in the encounter:  None   Patient's location:  In her car in parking lot  Provider's location:  My office   CHIEF COMPLAINT: F/u of left breast cancer  SUMMARY OF ONCOLOGIC HISTORY: Oncology History Overview Note  Cancer Staging Malignant neoplasm of upper-inner quadrant of left breast in female, estrogen receptor positive ([place]) Staging form: Breast, AJCC 8th Edition - Clinical stage from 03/22/2019: Stage IA (cT1c, cN0, cM0, G3, ER+, PR+, HER2+) - Signed by Truitt Merle, MD on 03/22/2019    Malignant neoplasm of upper-inner quadrant of left breast in female, estrogen receptor positive ([place])  03/10/2019 Mammogram   Diagnostic Mammogram 03/10/19  IMPRESSION: 1. Suspicious mass in the 10 o'clock position of the left breast, 3cm from the nipple measuring 12x7x51m. Biopsy is indicated. 2. Indeterminate mass in the 5:30 o'clock position  of the left breast, 5cm from the nipple, measuring 9x6x760m Although this may reflect a fibroadenoma, tissue sampling is recommended.   03/16/2019 Initial Biopsy   Diagnosis 03/16/19  1. 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 SEE NOTE 2. Breast, left, needle core biopsy, 5:30 o'clock - FIBROADENOMA   03/16/2019 Receptors her2   Results: IMMUNOHISTOCHEMICAL AND MORPHOMETRIC ANALYSIS PERFORMED MANUALLY The tumor cells are POSITIVE for Her2 (3+). Estrogen Receptor: 100%, POSITIVE, STRONG STAINING INTENSITY Progesterone Receptor: 100%, POSITIVE, STRONG STAINING INTENSITY Proliferation Marker Ki67: 60%   03/21/2019 Initial Diagnosis   Malignant neoplasm of upper-inner quadrant of left breast in [place], [place] receptor positive [place]  03/22/2019 Cancer Staging   Staging form: Breast, AJCC 8th Edition - Clinical stage from 03/22/2019: Stage IA (cT1c, cN0, cM0, G3, ER+, PR+, HER2+) - Signed by FeTruitt MerleMD on 03/22/2019   03/31/2019 Genetic Testing   VUS in CTNNA1 called [id number]>T (Intronic) was identified on the [facility] Common Hereditary Cancers Panel. The Common Hereditary Cancers Panel offered by [facility] includes sequencing and/or deletion duplication testing of the following 47 genes: APC, ATM, AXIN2, BARD1, BMPR1A, BRCA1, BRCA2, BRIP1, CDH1, CDKN2A (p14ARF), CDKN2A (p16INK4a), CKD4, CHEK2, CTNNA1, DICER1, EPCAM (Deletion/duplication testing only), GREM1 (promoter region deletion/duplication testing only), KIT, MEN1, MLH1, MSH2, MSH3, MSH6, MUTYH, NBN, NF1, NHTL1, PALB2, PDGFRA, PMS2, POLD1, POLE, PTEN, RAD50, RAD51C, RAD51D, SDHB, SDHC, SDHD, SMAD4, SMARCA4. STK11, TP53, TSC1, TSC2, and VHL.  The following genes were evaluated for sequence changes only: SDHA and HOXB13 c.251G>A variant only. The report date is 03/30/2019.    03/31/2019 Breast MRI   MRI breast 03/31/19  IMPRESSION: 1. 5  millimeter enhancing oval mass in the anteromedial aspect of the RIGHT  breast warranting further evaluation. Given its anterior depth and visibility on noncontrast images, ultrasound is recommended to determine if there is a sonographic correlate. If no sonographic correlate is identified to guide biopsy, MRI biopsy of the RIGHT breast is recommended. 2. Known malignancy in the UPPER INNER QUADRANT of the LEFT breast measuring 1.0 centimeters. 3. 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   04/04/2019 Surgery   RADIOCATIVE SEED GUIDED LEFT BREAST LUMPECTOMY, LEFT AXILLARY SENTINEL LYMPH NODE BIOPSY and PAC placement  By Dr. Brantley Stage  04/04/19    04/04/2019 Pathology Results   DIAGNOSIS: 04/04/19  A. BREAST, LEFT, LUMPECTOMY:  -  Invasive ductal carcinoma, Nottingham grade 3 of 3, 1.3 cm  -  Ductal carcinoma in-situ, intermediate grade  -  Margins uninvolved by carcinoma (0.1 cm; posterior margin; see  comment)  -  Previous biopsy site changes present  -  See oncology table and comment below   B. BREAST, LEFT, ADDITIONAL MEDIAL MARGIN, EXCISION:  -  Fibrocystic changes  -  No residual carcinoma identified   C. LYMPH NODE, LEFT AXILLARY, SENTINEL, BIOPSY:  -   No carcinoma identified in one lymph node (0/1)    04/04/2019 Cancer Staging   Staging form: Breast, AJCC 8th Edition - Pathologic stage from 04/04/2019: Stage IA (pT1c, pN0, cM0, G3, ER+, PR+, HER2+) - Signed by Truitt Merle, MD on 04/21/2019   04/11/2019 Pathology Results   Diagnosis Breast, right, needle core biopsy, 2 o'clock - SMALL INTRADUCTAL PAPILLOMA. SEE NOTE - SMALL FIBROADENOMA - NEGATIVE FOR CARCINOMA   05/12/2019 - 06/10/2020 Chemotherapy   Adjuvant Weekly Taxol and Herceptin for 12 weeks starting 05/12/19, switched to Abraxane on 08/04/19 due to skin rash. Completed on 08/11/19.  Followed by maintenance Herceptin q3weeks to complete 1 year treatment (from 05/12/19) starting 09/01/19-06/10/20   08/29/2019 - 09/26/2019 Radiation Therapy   Adjuvant  radiation per Dr. Sondra Come    10/12/2019 -  Anti-estrogen oral therapy   Tamoxifen 20mg  daily starting 10/12/19   11/27/2019 Survivorship   SCP delivered by [name], NP    03/15/2020 Mammogram   IMPRESSION: 1. Expected surgical changes at the lumpectomy site in the central posterior left breast.   2. No suspicious changes at the site of the biopsied papilloma in the lower inner retroareolar right breast (ribbon clip). Surgical excision is planned for this lesion.   3.  No evidence of malignancy in the bilateral breasts.   04/17/2020 Surgery   RIGHT BREAST LUMPECTOMY WITH RADIOACTIVE SEED LOCALIZATION and PAC removal by Dr Brantley Stage   FINAL MICROSCOPIC DIAGNOSIS:   A. BREAST, RIGHT, LUMPECTOMY:  - Biopsy clip.  Fibroadenoma.  Fibroadenomatoid change.  Fibrocystic  change.        CURRENT THERAPY:  Zometa every 6 months starting5/17/21 Tamoxifen 20mg  daily starting 10/12/19. Start Zoladex injection 08/26/20. Switched to Anastrozole in 09/2020  INTERVAL HISTORY:  [employer] is scheduled for a phone visit to discuss her recent breast biopsy results.  She was found to have abnormal non-mass-like enhancement within the lower right breast on recent screening MRI, and a biopsy was done last week.  She tolerated procedure well, she is quite anxious about her cancer treatment.  She is clinically doing well, tolerating anastrozole well, with mild hot flashes, no other noticeable side effects.  Mild neuropathy is stable.  No other new complaints.  All other systems were reviewed with the patient and  are negative.  MEDICAL HISTORY:  Past Medical History:  Diagnosis Date  . Asthma   . Family history of breast cancer   . Family history of lung cancer   . Personal history of chemotherapy   . Personal history of radiation therapy     SURGICAL HISTORY: Past Surgical History:  Procedure Laterality Date  . BREAST BIOPSY Right   . BREAST LUMPECTOMY Left 04/04/2019  . BREAST LUMPECTOMY  WITH RADIOACTIVE SEED AND SENTINEL LYMPH NODE BIOPSY Left 04/04/2019   Procedure: RADIOCATIVE SEED GUIDED LEFT BREAST LUMPECTOMY, LEFT AXILLARY SENTINEL LYMPH NODE BIOPSY;  Surgeon: Erroll Luna, MD;  Location: [facility];  Service: General;  Laterality: Left;  . BREAST LUMPECTOMY WITH RADIOACTIVE SEED LOCALIZATION Right 04/17/2020   Procedure: RIGHT BREAST LUMPECTOMY WITH RADIOACTIVE SEED LOCALIZATION;  Surgeon: Erroll Luna, MD;  Location: [facility];  Service: General;  Laterality: Right;  . NASAL SINUS SURGERY    . PORT-A-CATH REMOVAL Right 04/17/2020   Procedure: REMOVAL PORT-A-CATH;  Surgeon: Erroll Luna, MD;  Location: [facility];  Service: General;  Laterality: Right;  . PORTACATH PLACEMENT N/A 04/04/2019   Procedure: INSERTION PORT-A-CATH WITH ULTRASOUND;  Surgeon: Erroll Luna, MD;  Location: [facility];  Service: General;  Laterality: N/A;    I have reviewed the social history and family history with the patient and they are unchanged from previous note.  ALLERGIES:  is allergic to gadolinium derivatives, doxycycline, and latex.  MEDICATIONS:  Current Outpatient Medications  Medication Sig Dispense Refill  . albuterol (PROVENTIL HFA;VENTOLIN HFA) 108 (90 BASE) MCG/ACT inhaler Inhale 2 puffs into the lungs every 6 (six) hours as needed. For shortness of breath    . ALPRAZolam (XANAX) 1 MG tablet Take 0.5 mg by mouth 2 (two) times daily as needed.    Marland Kitchen anastrozole (ARIMIDEX) 1 MG tablet Take 1 tablet (1 mg total) by mouth daily. 30 tablet 3  . Ascorbic Acid (VITAMIN C) 1000 MG tablet Take 1,000 mg by mouth daily.    . cholecalciferol (VITAMIN D3) 25 MCG (1000 UT) tablet Take 2,000 Units by mouth daily.     Marland Kitchen doxycycline (VIBRA-TABS) 100 MG tablet Take 1 tablet (100 mg total) by mouth 2 (two) times daily. 14 tablet 0  . fexofenadine (ALLEGRA) 60 MG tablet Take 60 mg by mouth 2 (two) times daily.    . fluticasone  (FLONASE) 50 MCG/ACT nasal spray Place into both nostrils daily.    Marland Kitchen LORazepam (ATIVAN) 1 MG tablet Take 1 tablet (1 mg total) by mouth every 8 (eight) hours. 2 tablet 0  . predniSONE (DELTASONE) 50 MG tablet Take 1 tab 13hr, 7hr and 1 hr before MRI 3 tablet 0  . SUMAtriptan (IMITREX) 100 MG tablet Take 100 mg by mouth.    . tamoxifen (NOLVADEX) 20 MG tablet Take 1 tablet (20 mg total) by mouth daily. 90 tablet 1  . venlafaxine XR (EFFEXOR-XR) 37.5 MG 24 hr capsule TAKE 1 CAPSULE BY MOUTH DAILY WITH BREAKFAST. 90 capsule 1   No current facility-administered medications for this visit.    PHYSICAL EXAMINATION: ECOG PERFORMANCE STATUS: 1 - Symptomatic but completely ambulatory  No vitals taken today, Exam not performed today   LABORATORY DATA:  I have reviewed the data as listed CBC Latest Ref Rng & Units 09/23/2020 08/19/2020 06/10/2020  WBC 4.0 - 10.5 K/uL 7.3 6.3 6.1  Hemoglobin 12.0 - 15.0 g/dL 13.3 13.3 11.9(L)  Hematocrit 36.0 - 46.0 % 39.4 38.8 35.4(L)  Platelets 150 - 400 K/uL 283 300 273     CMP Latest Ref Rng & Units 09/23/2020 08/19/2020 06/10/2020  Glucose 70 - 99 mg/dL 103(H) 86 85  BUN 6 - 20 mg/dL _0 Creatinine 0.44 - 1.00 mg/dL 0.77 0.77 0.72  Sodium 135 - 145 mmol/L 141 140 143  Potassium 3.5 - 5.1 mmol/L 4.4 4.6 3.7  Chloride 98 - 111 mmol/L 105 104 109  CO2 22 - 32 mmol/L _1 Calcium 8.9 - 10.3 mg/dL 8.9 9.1 9.3  Total Protein 6.5 - 8.1 g/dL 7.1 7.2 6.7  Total Bilirubin 0.3 - 1.2 mg/dL 0.4 0.4 0.3  Alkaline Phos 38 - 126 U/L 31(L) 33(L) 32(L)  AST 15 - 41 U/L _2 ALT 0 - 44 U/L _3 RADIOGRAPHIC STUDIES: I have personally reviewed the radiological images as listed and agreed with the findings in the report. No results found.   ASSESSMENT & PLAN:  Chelsea Hobbs is a 50 y.o. female with   1.right breast DCIS, ER/PER pending  -She was found to have a 2 x 3 cm area of non-mass-like enhancement in the lower right breast by  screening MRI -I discussed her biopsy results, which showed low-grade DCIS and atypical lobular hyperplasia.  ER/PR still pending. -I recommend surgical resection, which is a standard care.  She is likely a candidate for lumpectomy. -Due to her previous multiple surgery for left breast cancer, and the need of intensive screening for breast cancer in the future after lumpectomy, she is strongly favor bilateral mastectomy.  Given her young age, underlying anxiety issue with cancer screening, I think that is a reasonable decision.  I supported her. Her genetic was negative  -She is scheduled to see Dr. Brantley Stage next Monday to discuss surgery. -I discussed the limitation of biopsy, and small possibility of invasive cancer on her surgical sample.  I will see her after surgery.   2. Malignant neoplasm of upper-inner quadrant of left breast, StageIA,p(T1cN0M0), ER+/PR+, HER2+, GradeIII -She was diagnosed in 03/2019. She underwent left breastlumpectomyand SLNB on 04/04/19.Her DCIS margins were close (61m). -She completedadjuvant Taxol/Herceptinincluding maintenance Herceptinand adjuvant Radiation.  -Given her ER/PR positive disease and premenopausal status, I started her on Tamoxifen in 10/2019.Tolerating well withsomeanxiety and hot flashes. -She underwent right Breast lumpectomy to remove her benign fibroadenoma on 04/17/20. -I have switched her tamoxifen to Zoladex injection and and anastrozole, she is tolerating well -She plans to have BSO in near future, she will discuss with her GYN \   PLAN: -I reviewed her recent right breast biopsy results -She is scheduled to see her surgeon Dr. CBrantley Stagenext Monday, to discuss right breast surgery.  She strongly prefer bilateral mastectomy -I will see her back in June as scheduled   No problem-specific Assessment & Plan notes found for this encounter.   No orders of the defined types were placed in this encounter.  I discussed the assessment  and treatment plan with the patient. The patient was provided an opportunity to ask questions and all were answered. The patient agreed with the plan and demonstrated an understanding of the instructions.  The patient was advised to call back or seek an in-person evaluation if the symptoms worsen or if the condition fails to improve as anticipated.  The total time spent in the appointment was 22 minutes.    YTruitt Merle MD 11/04/2020   I, AJoslyn Devon am acting as scribe  for Truitt Merle, MD.   I have reviewed the above documentation for accuracy and completeness, and I agree with the above.

## 2020-10-31 ENCOUNTER — Ambulatory Visit
Admission: RE | Admit: 2020-10-31 | Discharge: 2020-10-31 | Disposition: A | Discharge status: Home / Self Care | Payer: BC Managed Care – PPO | Source: Ambulatory Visit | Attending: Hematology | Admitting: Hematology

## 2020-10-31 ENCOUNTER — Other Ambulatory Visit: Payer: Self-pay

## 2020-10-31 ENCOUNTER — Other Ambulatory Visit: Payer: Self-pay | Admitting: Diagnostic Radiology

## 2020-10-31 DIAGNOSIS — R9389 Abnormal findings on diagnostic imaging of other specified body structures: Secondary | ICD-10-CM

## 2020-10-31 DIAGNOSIS — D0511 Intraductal carcinoma in situ of right breast: Secondary | ICD-10-CM | POA: Diagnosis not present

## 2020-10-31 DIAGNOSIS — R928 Other abnormal and inconclusive findings on diagnostic imaging of breast: Secondary | ICD-10-CM | POA: Diagnosis not present

## 2020-10-31 MED ORDER — GADOBUTROL 1 MMOL/ML IV SOLN
5.0000 mL | Freq: Once | INTRAVENOUS | Status: AC | PRN
  Administered 2020-10-31: 5 mL via INTRAVENOUS

## 2020-11-04 ENCOUNTER — Encounter: Payer: Self-pay | Admitting: Hematology

## 2020-11-04 ENCOUNTER — Inpatient Hospital Stay (HOSPITAL_BASED_OUTPATIENT_CLINIC_OR_DEPARTMENT_OTHER): Payer: BC Managed Care – PPO | Admitting: Hematology

## 2020-11-04 DIAGNOSIS — Z170 Estrogen receptor positive status [ER+]: Secondary | ICD-10-CM

## 2020-11-04 DIAGNOSIS — C50212 Malignant neoplasm of upper-inner quadrant of left female breast: Secondary | ICD-10-CM | POA: Diagnosis not present

## 2020-11-11 ENCOUNTER — Ambulatory Visit: Payer: Self-pay | Admitting: Surgery

## 2020-11-11 DIAGNOSIS — D0512 Intraductal carcinoma in situ of left breast: Secondary | ICD-10-CM

## 2020-11-11 DIAGNOSIS — D0511 Intraductal carcinoma in situ of right breast: Secondary | ICD-10-CM | POA: Diagnosis not present

## 2020-11-11 DIAGNOSIS — C50912 Malignant neoplasm of unspecified site of left female breast: Secondary | ICD-10-CM | POA: Diagnosis not present

## 2020-11-11 NOTE — H&P (Signed)
Chelsea Hobbs Appointment: 11/11/2020 2:20 PM Location: [facility] Patient #: [id number] DOB: 10/23/1970 Married / Language: Cleophus Hobbs / Race: White Female  History of Present Illness [name] A. Jaydon Soroka MD; 11/11/2020 4:08 PM) Patient words: Patient returns for follow-up of a new mammographic finding of calcifications in her right breast. Patient has a history of left breast cancer treated in 2020 with left breast lumpectomy with lymphatic mapping. She also had radiation chemotherapy for this. She has had 2 other biopsies between her initial breast cancer surgery now. Magnetic resonance imaging was done for screening purposes which showed 2 x 3 cm area lower right breast of non-mass like enhancement. Right breast lower outer quadrant consistent with low -grade DCIS per core biopsy. Area is about a centimeter maximal diameter.     Diagnosis Breast, right, needle core biopsy, inferior - DUCTAL CARCINOMA IN SITU, SEE COMMENT. 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         CLINICAL DATA: 50 year old female for screening breast MRI with high lifetime risk of developing future breast cancer. History of LEFT breast cancer and lumpectomy in 2020. History of RIGHT breast papilloma with excision in 2021. History of remote benign LOWER RIGHT breast biopsy (COIL biopsy clip) in 2008.  LABS: None performed today  EXAM: BILATERAL BREAST MRI WITH AND WITHOUT CONTRAST  TECHNIQUE: Multiplanar, multisequence MR images of both breasts were obtained prior to and following the intravenous administration of 6 ml of Gadavist  Three-dimensional MR images were rendered by post-processing of the original MR data on an independent workstation. The three-dimensional MR images were interpreted, and findings are reported in the following complete MRI report for this study. Three dimensional images were evaluated at the independent  interpreting workstation using the DynaCAD thin client.  COMPARISON: 03/31/2019 breast MR, and 03/15/2020 and prior mammograms  FINDINGS: Breast composition: c. Heterogeneous fibroglandular tissue.  Background parenchymal enhancement: Mild  Right breast: A 2 x 3 cm area of persistent non masslike enhancement within the LOWER RIGHT breast is identified (series 12: Image 102). Biopsy clip artifact (COIL clip on mammograms) from remote LOWER RIGHT breast biopsy in 2008 lies anterior to this non masslike enhancement.  Surgical changes within the RIGHT breast are noted.  Left breast: No suspicious mass or abnormal enhancement. Lumpectomy changes are identified.  Lymph nodes: No abnormal appearing lymph nodes.  Ancillary findings: None.  IMPRESSION: 1. Indeterminate 2 x 3 cm LOWER RIGHT breast non masslike enhancement. Tissue sampling is recommended. 2. No abnormal appearing lymph nodes. 3. No MR evidence of LEFT breast malignancy. Surgical changes within both breasts.  RECOMMENDATION: MR guided RIGHT breast biopsy.  BI-RADS CATEGORY 4: Suspicious.   Electronically Signed By: Margarette Canada M.D. On: 10/21/2020 10:36.  The patient is a 50 year old female.   Vitals Arville Go Fox CMA; 11/11/2020 2:12 PM) 11/11/2020 2:11 PM Weight: 130.5 lb Height: 62in Body Surface Area: 1.59 m Body Mass Index: 23.87 kg/m  Temp.: 97.80F  Pulse: 110 (Regular)  Resp.: 18 (Unlabored)  P.OX: 98% (Room air) BP: 124/68(Sitting, Left Arm, Standard)        Physical Exam (Danette Weinfeld A. [name] MD; 11/11/2020 4:08 PM)  General Mental Status-Alert. General Appearance-Consistent with stated age. Hydration-Well hydrated. Voice-Normal.  Chest and Lung Exam Chest and lung exam reveals -quiet, even and easy respiratory effort with no use of accessory muscles and on auscultation, normal breath sounds, no adventitious sounds and normal vocal resonance. Inspection Chest Wall -  Normal. Back - normal.  Breast Note: Left breast shows postsurgical and postradiation changes. Right breast shows bruising from core biopsy. Masses in either breast No Discharge. Bilaterally  Cardiovascular Cardiovascular examination reveals -on palpation PMI is normal in location and amplitude, no palpable S3 or S4. Normal cardiac borders., normal heart sounds, regular rate and rhythm with no murmurs, carotid auscultation reveals no bruits and normal pedal pulses bilaterally.  Neurologic Neurologic evaluation reveals -alert and oriented x 3 with no impairment of recent or remote memory. Mental Status-Normal.    Assessment & Plan ([name] A. Jameer Storie MD; 11/11/2020 4:09 PM)  BREAST CANCER, LEFT (C50.912) Impression: No evidence of recurrent disease  Current Plans You are being scheduled for surgery- Our schedulers will call you.  You should hear from our office's scheduling department within 5 working days about the location, date, and time of surgery. We try to make accommodations for patient's preferences in scheduling surgery, but sometimes the OR schedule or the surgeon's schedule prevents [place] from making those accommodations.  If you have not heard from our office [phone]) in 5 working days, call the office and ask for your surgeon's nurse.  If you have other questions about your diagnosis, plan, or surgery, call the office and ask for your surgeon's nurse.  Pt Education - [facility] Breast Cancer Information Given - [facility] "Breast Journey" Package Pt Education - [facility] Mastectomy HCI Pt Education - ABC (After Breast Cancer) Class Info: discussed with patient and provided information.  BREAST NEOPLASM, TIS (DCIS), RIGHT (D05.11) Impression: Patient has chosen bilateral nipple sparing mastectomy due to history and multiple breast biopsies. She is seeing plastic surgery tomorrow. Recommend right axillary sentinel mapping. Discussed the procedure great length with her.  Discussed potential complications of bleeding, infection, use of implants, lymphedema risk on the right, and potential other treatment options or procedures necessary depending on how the skin and nipple heal. Discussed potential for nipple loss and insensate nipple. Discussed treatment options for breast cancer to include breast conservation vs mastectomy with reconstruction. Pt has decided on mastectomy. Risk include bleeding, infection, flap necrosis, pain, numbness, recurrence, hematoma, other surgery needs. Pt understands and agrees to proceed. Risk of sentinel lymph node mapping include bleeding, infection, lymphedema, shoulder pain. stiffness, dye allergy. cosmetic deformity , blood clots, death, need for more surgery. Pt agres to proceed.

## 2020-11-12 DIAGNOSIS — Z853 Personal history of malignant neoplasm of breast: Secondary | ICD-10-CM | POA: Diagnosis not present

## 2020-11-12 DIAGNOSIS — Z923 Personal history of irradiation: Secondary | ICD-10-CM | POA: Diagnosis not present

## 2020-11-12 DIAGNOSIS — D0511 Intraductal carcinoma in situ of right breast: Secondary | ICD-10-CM | POA: Diagnosis not present

## 2020-11-18 ENCOUNTER — Other Ambulatory Visit: Payer: Self-pay

## 2020-11-18 ENCOUNTER — Other Ambulatory Visit: Payer: Self-pay | Admitting: Hematology

## 2020-11-18 ENCOUNTER — Inpatient Hospital Stay: Payer: BC Managed Care – PPO | Attending: Hematology

## 2020-11-18 ENCOUNTER — Encounter: Payer: Self-pay | Admitting: Hematology

## 2020-11-18 ENCOUNTER — Inpatient Hospital Stay: Payer: BC Managed Care – PPO

## 2020-11-18 VITALS — BP 114/74 | HR 91 | Temp 97.6°F | Resp 18

## 2020-11-18 DIAGNOSIS — Z5111 Encounter for antineoplastic chemotherapy: Secondary | ICD-10-CM | POA: Diagnosis not present

## 2020-11-18 DIAGNOSIS — C50919 Malignant neoplasm of unspecified site of unspecified female breast: Secondary | ICD-10-CM | POA: Diagnosis not present

## 2020-11-18 DIAGNOSIS — Z170 Estrogen receptor positive status [ER+]: Secondary | ICD-10-CM | POA: Insufficient documentation

## 2020-11-18 DIAGNOSIS — Z79899 Other long term (current) drug therapy: Secondary | ICD-10-CM | POA: Insufficient documentation

## 2020-11-18 DIAGNOSIS — Z95828 Presence of other vascular implants and grafts: Secondary | ICD-10-CM

## 2020-11-18 DIAGNOSIS — T451X5A Adverse effect of antineoplastic and immunosuppressive drugs, initial encounter: Secondary | ICD-10-CM

## 2020-11-18 DIAGNOSIS — G620 Drug-induced polyneuropathy: Secondary | ICD-10-CM

## 2020-11-18 DIAGNOSIS — C50212 Malignant neoplasm of upper-inner quadrant of left female breast: Secondary | ICD-10-CM | POA: Insufficient documentation

## 2020-11-18 LAB — CMP (CANCER CENTER ONLY)
ALT: 27 U/L (ref 0–44)
AST: 28 U/L (ref 15–41)
Albumin: 3.7 g/dL (ref 3.5–5.0)
Alkaline Phosphatase: 46 U/L (ref 38–126)
Anion gap: 10 (ref 5–15)
BUN: 13 mg/dL (ref 6–20)
CO2: 27 mmol/L (ref 22–32)
Calcium: 8.7 mg/dL — ABNORMAL LOW (ref 8.9–10.3)
Chloride: 108 mmol/L (ref 98–111)
Creatinine: 0.67 mg/dL (ref 0.44–1.00)
GFR, Estimated: >60 mL/min (ref >60)
Glucose, Bld: 84 mg/dL (ref 70–99)
Potassium: 4.1 mmol/L (ref 3.5–5.1)
Sodium: 145 mmol/L (ref 135–145)
Total Bilirubin: 0.2 mg/dL — ABNORMAL LOW (ref 0.3–1.2)
Total Protein: 6.7 g/dL (ref 6.5–8.1)

## 2020-11-18 LAB — CBC WITH DIFFERENTIAL (CANCER CENTER ONLY)
Abs Immature Granulocytes: 0.01 10*3/uL (ref 0.00–0.07)
Basophils Absolute: 0.1 10*3/uL (ref 0.0–0.1)
Basophils Relative: 1 %
Eosinophils Absolute: 0.3 10*3/uL (ref 0.0–0.5)
Eosinophils Relative: 6 %
HCT: 38.1 % (ref 36.0–46.0)
Hemoglobin: 13.1 g/dL (ref 12.0–15.0)
Immature Granulocytes: 0 %
Lymphocytes Relative: 41 %
Lymphs Abs: 2 10*3/uL (ref 0.7–4.0)
MCH: 31 pg (ref 26.0–34.0)
MCHC: 34.4 g/dL (ref 30.0–36.0)
MCV: 90.3 fL (ref 80.0–100.0)
Monocytes Absolute: 0.7 10*3/uL (ref 0.1–1.0)
Monocytes Relative: 14 %
Neutro Abs: 1.9 10*3/uL (ref 1.7–7.7)
Neutrophils Relative %: 38 %
Platelet Count: 268 10*3/uL (ref 150–400)
RBC: 4.22 MIL/uL (ref 3.87–5.11)
RDW: 12.2 % (ref 11.5–15.5)
WBC Count: 5 10*3/uL (ref 4.0–10.5)
nRBC: 0 % (ref 0.0–0.2)

## 2020-11-18 MED ORDER — HEPARIN SOD (PORK) LOCK FLUSH 100 UNIT/ML IV SOLN
500.0000 [IU] | Freq: Once | INTRAVENOUS | Status: DC | PRN
  Filled 2020-11-18: qty 5

## 2020-11-18 MED ORDER — ZOLEDRONIC ACID 4 MG/100ML IV SOLN
4.0000 mg | Freq: Once | INTRAVENOUS | Status: AC
  Administered 2020-11-18: 4 mg via INTRAVENOUS

## 2020-11-18 MED ORDER — GABAPENTIN 100 MG PO CAPS
200.0000 mg | ORAL_CAPSULE | Freq: Every day | ORAL | 6 refills | Status: DC
Start: 2020-11-18 — End: 2021-08-11

## 2020-11-18 MED ORDER — GOSERELIN ACETATE 3.6 MG ~~LOC~~ IMPL
3.6000 mg | DRUG_IMPLANT | Freq: Once | SUBCUTANEOUS | Status: AC
  Administered 2020-11-18: 3.6 mg via SUBCUTANEOUS

## 2020-11-18 MED ORDER — SODIUM CHLORIDE 0.9% FLUSH
10.0000 mL | INTRAVENOUS | Status: DC | PRN
  Filled 2020-11-18: qty 10

## 2020-11-18 MED ORDER — SODIUM CHLORIDE 0.9 % IV SOLN
Freq: Once | INTRAVENOUS | Status: AC
  Filled 2020-11-18: qty 250

## 2020-11-18 MED ORDER — GOSERELIN ACETATE 3.6 MG ~~LOC~~ IMPL
DRUG_IMPLANT | SUBCUTANEOUS | Status: AC
  Filled 2020-11-18: qty 3.6

## 2020-11-18 MED ORDER — ALTEPLASE 2 MG IJ SOLR
2.0000 mg | Freq: Once | INTRAMUSCULAR | Status: DC | PRN
  Filled 2020-11-18: qty 2

## 2020-11-18 NOTE — Patient Instructions (Signed)
[facility]  Discharge Instructions: Thank you for choosing [facility] to provide your oncology and hematology care.   If you have a lab appointment with the [facility], please go directly to the [facility] and check in at the registration area.   Wear comfortable clothing and clothing appropriate for easy access to any Portacath or PICC line.   We strive to give you quality time with your provider. You may need to reschedule your appointment if you arrive late (15 or more minutes).  Arriving late affects you and other patients whose appointments are after yours.  Also, if you miss three or more appointments without notifying the office, you may be dismissed from the clinic at the provider's discretion.      For prescription refill requests, have your pharmacy contact our office and allow 72 hours for refills to be completed.    Today you received the following: zometa/zoladex     To help prevent nausea and vomiting after your treatment, we encourage you to take your nausea medication as directed.  BELOW ARE SYMPTOMS THAT SHOULD BE REPORTED IMMEDIATELY: . *FEVER GREATER THAN 100.4 F (38 C) OR HIGHER . *CHILLS OR SWEATING . *NAUSEA AND VOMITING THAT IS NOT CONTROLLED WITH YOUR NAUSEA MEDICATION . *UNUSUAL SHORTNESS OF BREATH . *UNUSUAL BRUISING OR BLEEDING . *URINARY PROBLEMS (pain or burning when urinating, or frequent urination) . *BOWEL PROBLEMS (unusual diarrhea, constipation, pain near the anus) . TENDERNESS IN MOUTH AND THROAT WITH OR WITHOUT PRESENCE OF ULCERS (sore throat, sores in mouth, or a toothache) . UNUSUAL RASH, SWELLING OR PAIN  . UNUSUAL VAGINAL DISCHARGE OR ITCHING   Items with * indicate a potential emergency and should be followed up as soon as possible or go to the Emergency Department if any problems should occur.  Please show the CHEMOTHERAPY ALERT CARD or IMMUNOTHERAPY ALERT CARD at check-in to the Emergency  Department and triage nurse.  Should you have questions after your visit or need to cancel or reschedule your appointment, please contact [facility]  Dept: [phone]  and follow the prompts.  Office hours are 8:00 a.m. to 4:30 p.m. Monday - Friday. Please note that voicemails left after 4:00 p.m. may not be returned until the following business day.  We are closed weekends and major holidays. You have access to a nurse at all times for urgent questions. Please call the main number to the clinic Dept: [phone] and follow the prompts.   For any non-urgent questions, you may also contact your provider using MyChart. We now offer e-Visits for anyone 74 and older to request care online for non-urgent symptoms. For details visit [email or web address].   Also download the MyChart app! Go to the app store, search "MyChart", open the app, select [facility], and log in with your MyChart username and password.  Due to Covid, a mask is required upon entering the hospital/clinic. If you do not have a mask, one will be given to you upon arrival. For doctor visits, patients may have 1 support person aged 70 or older with them. For treatment visits, patients cannot have anyone with them due to current Covid guidelines and our immunocompromised population.

## 2020-11-18 NOTE — Progress Notes (Signed)
Chelsea Hobbs states she is taking gabapentin 200mg  every night for the tingling in her fingers.

## 2020-11-18 NOTE — Progress Notes (Signed)
Received call from patient regarding financial assistance.   Introduced myself as Arboriculturist and discussed the one-time [employer] grant to assist with personal expenses while going through treatment. Based on verbal income guidelines, her household income is over the allowed amount.  Discussed ded/OOp for insurance. She states her insurance renewed in December and she has not met OOP. Advised I would check to see if there is any available assistance for copay for her specific treatment drugs and follow up. She was very Patent attorney.  She had several questions regarding her account balance and how to address these and assistance programs. We talked about [employer] which she states she accidentally enrolled in when trying to inquire about them. Advised her to reach out to Patient Accounting(billing) to discuss further at [phone] and select option for Hospital billing. We also discussed the hardship settlement but her account does not meet the balance requirement.  She has my card for any additional financial questions or concerns.

## 2020-11-21 ENCOUNTER — Telehealth: Payer: Self-pay | Admitting: General Practice

## 2020-11-21 NOTE — Telephone Encounter (Signed)
[place] CSW Progress Notes  Request from Stefanie Libel, Estate manager/land agent, to call patient to discuss any available resources for help w medical bills.  Called patient, left generic VM, awaiting return call.  She can be given information on [facility] and [facility] [place], both of which offer financial assistance based on income qualifications.  Edwyna Shell, LCSW Clinical Social Worker Phone:  [phone]

## 2020-11-22 ENCOUNTER — Telehealth: Payer: Self-pay | Admitting: General Practice

## 2020-11-22 ENCOUNTER — Encounter: Payer: Self-pay | Admitting: General Practice

## 2020-11-22 NOTE — Progress Notes (Signed)
[place] CSW Progress Notes  Call to patient at request of [place].  She does not qualify for [employer], has had 3 years of medical bills that have been financially stressful.  Discussed options of [employer] [facility] [place], [employer] and [employer].  All these are income based - emailed patient information on each of them and encouraged her to determine if she is eligible.  She will let us know if she is interested in pursuing any of these.  Chelsea Shell, LCSW Clinical Social Worker Phone:  [phone]

## 2020-11-22 NOTE — Telephone Encounter (Signed)
[place] CSW Progress Notes  Second call to patient per request from [place].  Per [employer] [facility], patient wants information on resources for financial distress.  Left VM w my contact information and encouragement to return the call.  Edwyna Shell, LCSW Clinical Social Worker Phone:  [phone]

## 2020-11-25 ENCOUNTER — Telehealth: Payer: Self-pay | Admitting: Hematology

## 2020-11-25 NOTE — Telephone Encounter (Signed)
R/s appts per 5/16 sch msg. Called pt, no answer. Left msg with updated appts date and times.

## 2020-11-29 ENCOUNTER — Telehealth: Payer: Self-pay | Admitting: Hematology

## 2020-11-29 DIAGNOSIS — D0511 Intraductal carcinoma in situ of right breast: Secondary | ICD-10-CM | POA: Diagnosis not present

## 2020-11-29 DIAGNOSIS — Z853 Personal history of malignant neoplasm of breast: Secondary | ICD-10-CM | POA: Diagnosis not present

## 2020-11-29 DIAGNOSIS — Z923 Personal history of irradiation: Secondary | ICD-10-CM | POA: Diagnosis not present

## 2020-11-29 NOTE — H&P (Signed)
  Subjective:     Patient ID: Chelsea Hobbs is a 50 y.o. female.  HPI  Here for follow up discussion breast reconstruction prior to planned bilateral mastectomies.  Screening MMG fall 2020 showed possible left breast mass. MMG/US with mass left breast 10 o'clock, 3cmfn measuring 12x7x36mm. Mass in the left 5:30 o'clock, 5cmfn, 9x6x52mm., suspect fibroadenoma. 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 MRI showed 0.5 cm mass in the RIGHT breast. Known malignancy in the left UIQ  1.0 cm, biopsy proven fibroadenoma in the left LOQ. 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 0/1 SLN. RIGHT breast biopsy with intraductal papilloma, fibroadenoma (s/p excision at time of port removal 04/2020). Completed adjuvant chemotherapy Jan 2021. Completed adjuvant RT LEFT chest 09/16/2019.  Screening MRI 10/2020 demonstrated 2 x 3 cm area of NME within the RIGHT lower breast.  Biopsy clip artifact from remote breast biopsy in 2008 lies anterior to NME.  No abnormal appearing LN. 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 Unable to complete prognostic panel.  Patient has elected to proceed with bilateral mastectomies.  Mother and PGM with breast ca. Genetics with VUS CTNNA1. On anastrozole.  Prior to lumpectomy 34 B.   PMH includes colitis and uses mesalamine suppositories.  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 Lives with husband.   Review of Systems     Objective:   Physical Exam  Cardiovascular: Normal rate, regular rhythm and normal heart sounds.  Pulmonary/Chest: Effort normal and breath sounds normal.  Abdominal:  Small volume for reconstruction  Neurological: She is alert.    Breasts: No ptosis bilateral, right < left volume Bilateral periareolar scar No masses SN to nipple R 22 L 24 cm BW R 17 L 19 cm CW 12 cm Nipple to IMF R  6 L 8 cm    Assessment:     Left breast ca UIQ ER+ s/p lumpectomy, SLN Adjuvant chemotherapy, RT Right breast DCIS    Plan:     Plan bilateral NSM with tissue expander reconstruction, acellular dermis to right chest, left latissimus dorsi flap to left chest.   Given her history radiation, reviewed this will significantly increase her risks of reconstruction including wound healing problems and contracture. Autologous tissue would reduce these risks back to baseline.Discussed in setting implant based reconstruction, recommend latissimus flap with expander over left. Discussed donor site incision, function latissimus, drains.  Over right chest, plan acellular dermis. Discussed use of acellular dermis in reconstruction, cadaveric source, incorporation over several weeks, risk that if has seroma or infection can act as additional nidus for infection if not incorporated.Reviewed this is an off label use of acellular dermis. Plan prepectoral placement.  Additional risks including but not limited to bleeding infection seroma hematoma blood clots in legs or lungs, poor scarring, need for additional surgery, damage to adjacent structures reviewed. Reviewed reconstrucion asensate and not stimulate. Reviewed risks mastectomy flap necrosis requiring additional surgery.  Rx for [place] given. Drain teaching completed. Rx for oxycodone, Robaxin, and Bactrim given.  Discussed risk COVID infectionthrough this elective surgery. Patient will receive COVID testing prior to surgery. Discussed even if patient receivesa negative test result, the tests in some cases may fail to detect the virus or patient maycontract COVID after the test.COVID 19 infectionbefore/during/aftersurgery may result in lead to a higher chance of complication and death.

## 2020-11-29 NOTE — Telephone Encounter (Signed)
R/s appt per 5/20 sch msg. Pt aware.  

## 2020-12-13 ENCOUNTER — Other Ambulatory Visit: Payer: Self-pay | Admitting: Hematology

## 2020-12-16 ENCOUNTER — Ambulatory Visit: Payer: BC Managed Care – PPO

## 2020-12-16 ENCOUNTER — Ambulatory Visit: Payer: BC Managed Care – PPO | Admitting: Hematology

## 2020-12-18 ENCOUNTER — Telehealth: Payer: Self-pay | Admitting: General Practice

## 2020-12-18 NOTE — Telephone Encounter (Signed)
[place] CSW Progress Notes  Call to patient to follow up on financial aid applications sent to her - asked her to let me know if she would like to pursue any of these options when she is here at [facility] on Friday 6/10.  Edwyna Shell, LCSW Clinical Social Worker Phone:  [phone]

## 2020-12-19 ENCOUNTER — Other Ambulatory Visit: Payer: Self-pay | Admitting: Hematology

## 2020-12-19 NOTE — Progress Notes (Signed)
Surgical Instructions    Your procedure is scheduled on Thursday June 16th.  Report to [facility] Main Entrance "A" at 5:30 A.M., then check in with the Admitting office.  Call this number if you have problems the morning of surgery:  [phone]   If you have any questions prior to your surgery date call [phone]: Open Monday-Friday 8am-4pm    Remember:  Do not eat after midnight the night before your surgery  You may drink clear liquids until 4:30am the morning of your surgery.   Clear liquids allowed are: Water, Non-Citrus Juices (without pulp), Carbonated Beverages, Clear Tea, Black Coffee Only, and Gatorade   Take these medicines the morning of surgery with A SIP OF WATER   anastrozole (ARIMIDEX) 1 MG tablet  montelukast (SINGULAIR) 10 MG tablet  sulfamethoxazole-trimethoprim (BACTRIM DS) 800-160 MG tablet  tamoxifen (NOLVADEX) 20 MG tablet  venlafaxine XR (EFFEXOR-XR) 37.5 MG 24 hr capsule  IF NEEDED  albuterol (PROVENTIL HFA;VENTOLIN HFA) 108 (90 BASE) MCG/ACT inhaler  ALPRAZolam (XANAX) 0.5 MG tablet   fexofenadine (ALLEGRA) 180 MG tablet  fluticasone (FLONASE) 50 MCG/ACT nasal spray  methocarbamol (ROBAXIN) 500 MG tablet  oxyCODONE (OXY IR/ROXICODONE) 5 MG immediate release tablet   As of today, STOP taking any Aspirin (unless otherwise instructed by your surgeon) Aleve, Naproxen, Ibuprofen, Motrin, Advil, Goody's, BC's, all herbal medications, fish oil, and all vitamins.          Do not wear jewelry or makeup Do not wear lotions, powders, perfumes, or deodorant. Do not shave 48 hours prior to surgery.   Do not bring valuables to the hospital. DO Not wear nail polish, gel polish, artificial nails, or any other type of covering on  natural nails including finger and toenails. If patients have artificial nails, gel coating, etc. that need to be removed by a nail salon please have this removed prior to surgery or surgery may need to be canceled/delayed if the  surgeon/ anesthesia feels like the patient is unable to be adequately monitored.             [facility] is not responsible for any belongings or valuables.  Do NOT Smoke (Tobacco/Vaping) or drink Alcohol 24 hours prior to your procedure If you use a CPAP at night, you may bring all equipment for your overnight stay.   Contacts, glasses, dentures or bridgework may not be worn into surgery, please bring cases for these belongings   For patients admitted to the hospital, discharge time will be determined by your treatment team.   Patients discharged the day of surgery will not be allowed to drive home, and someone needs to stay with them for 24 hours.  ONLY 1 SUPPORT PERSON MAY BE PRESENT WHILE YOU ARE IN SURGERY. IF YOU ARE TO BE ADMITTED ONCE YOU ARE IN YOUR ROOM YOU WILL BE ALLOWED TWO (2) VISITORS.  Minor children may have two parents present. Special consideration for safety and communication needs will be reviewed on a case by case basis.  Special instructions:    Oral Hygiene is also important to reduce your risk of infection.  Remember - BRUSH YOUR TEETH THE MORNING OF SURGERY WITH YOUR REGULAR TOOTHPASTE   [facility]- Preparing For Surgery  Before surgery, you can play an important role. Because skin is not sterile, your skin needs to be as free of germs as possible. You can reduce the number of germs on your skin by washing with CHG (chlorahexidine gluconate) Soap before surgery.  CHG is  an antiseptic cleaner which kills germs and bonds with the skin to continue killing germs even after washing.     Please do not use if you have an allergy to CHG or antibacterial soaps. If your skin becomes reddened/irritated stop using the CHG.  Do not shave (including legs and underarms) for at least 48 hours prior to first CHG shower. It is OK to shave your face.  Please follow these instructions carefully.     Shower the NIGHT BEFORE SURGERY and the MORNING OF SURGERY with CHG Soap.   If  you chose to wash your hair, wash your hair first as usual with your normal shampoo. After you shampoo, rinse your hair and body thoroughly to remove the shampoo.  Then [employer] and genitals (private parts) with your normal soap and rinse thoroughly to remove soap.  After that Use CHG Soap as you would any other liquid soap. You can apply CHG directly to the skin and wash gently with a scrungie or a clean washcloth.   Apply the CHG Soap to your body ONLY FROM THE NECK DOWN.  Do not use on open wounds or open sores. Avoid contact with your eyes, ears, mouth and genitals (private parts). Wash Face and genitals (private parts)  with your normal soap.   Wash thoroughly, paying special attention to the area where your surgery will be performed.  Thoroughly rinse your body with warm water from the neck down.  DO NOT shower/wash with your normal soap after using and rinsing off the CHG Soap.  Pat yourself dry with a CLEAN TOWEL.  Wear CLEAN PAJAMAS to bed the night before surgery  Place CLEAN SHEETS on your bed the night before your surgery  DO NOT SLEEP WITH PETS.   Day of Surgery:  Take a shower with CHG soap. Wear Clean/Comfortable clothing the morning of surgery Do not apply any deodorants/lotions.   Remember to brush your teeth WITH YOUR REGULAR TOOTHPASTE.   Please read over the following fact sheets that you were given.

## 2020-12-20 ENCOUNTER — Ambulatory Visit: Payer: Self-pay | Admitting: Surgery

## 2020-12-20 ENCOUNTER — Encounter (HOSPITAL_COMMUNITY)
Admission: RE | Admit: 2020-12-20 | Discharge: 2020-12-20 | Disposition: A | Discharge status: Home / Self Care | Payer: BC Managed Care – PPO | Source: Ambulatory Visit | Attending: Surgery | Admitting: Surgery

## 2020-12-20 ENCOUNTER — Inpatient Hospital Stay: Payer: BC Managed Care – PPO | Attending: Hematology

## 2020-12-20 ENCOUNTER — Other Ambulatory Visit: Payer: Self-pay

## 2020-12-20 ENCOUNTER — Encounter (HOSPITAL_COMMUNITY): Payer: Self-pay | Admitting: Surgery

## 2020-12-20 DIAGNOSIS — C50212 Malignant neoplasm of upper-inner quadrant of left female breast: Secondary | ICD-10-CM | POA: Insufficient documentation

## 2020-12-20 DIAGNOSIS — Z79899 Other long term (current) drug therapy: Secondary | ICD-10-CM | POA: Diagnosis not present

## 2020-12-20 DIAGNOSIS — Z170 Estrogen receptor positive status [ER+]: Secondary | ICD-10-CM | POA: Diagnosis not present

## 2020-12-20 DIAGNOSIS — Z95828 Presence of other vascular implants and grafts: Secondary | ICD-10-CM

## 2020-12-20 DIAGNOSIS — Z01818 Encounter for other preprocedural examination: Secondary | ICD-10-CM | POA: Insufficient documentation

## 2020-12-20 LAB — CBC WITH DIFFERENTIAL/PLATELET
Abs Immature Granulocytes: 0.02 10*3/uL (ref 0.00–0.07)
Basophils Absolute: 0.1 10*3/uL (ref 0.0–0.1)
Basophils Relative: 1 %
Eosinophils Absolute: 0.3 10*3/uL (ref 0.0–0.5)
Eosinophils Relative: 5 %
HCT: 39.5 % (ref 36.0–46.0)
Hemoglobin: 13.1 g/dL (ref 12.0–15.0)
Immature Granulocytes: 0 %
Lymphocytes Relative: 38 %
Lymphs Abs: 2.6 10*3/uL (ref 0.7–4.0)
MCH: 31 pg (ref 26.0–34.0)
MCHC: 33.2 g/dL (ref 30.0–36.0)
MCV: 93.4 fL (ref 80.0–100.0)
Monocytes Absolute: 0.6 10*3/uL (ref 0.1–1.0)
Monocytes Relative: 9 %
Neutro Abs: 3.2 10*3/uL (ref 1.7–7.7)
Neutrophils Relative %: 47 %
Platelets: 346 10*3/uL (ref 150–400)
RBC: 4.23 MIL/uL (ref 3.87–5.11)
RDW: 12.2 % (ref 11.5–15.5)
WBC: 6.9 10*3/uL (ref 4.0–10.5)
nRBC: 0 % (ref 0.0–0.2)

## 2020-12-20 LAB — COMPREHENSIVE METABOLIC PANEL
ALT: 50 U/L — ABNORMAL HIGH (ref 0–44)
AST: 34 U/L (ref 15–41)
Albumin: 4.1 g/dL (ref 3.5–5.0)
Alkaline Phosphatase: 52 U/L (ref 38–126)
Anion gap: 7 (ref 5–15)
BUN: 12 mg/dL (ref 6–20)
CO2: 27 mmol/L (ref 22–32)
Calcium: 9.6 mg/dL (ref 8.9–10.3)
Chloride: 105 mmol/L (ref 98–111)
Creatinine, Ser: 0.57 mg/dL (ref 0.44–1.00)
GFR, Estimated: >60 mL/min (ref >60)
Glucose, Bld: 90 mg/dL (ref 70–99)
Potassium: 3.9 mmol/L (ref 3.5–5.1)
Sodium: 139 mmol/L (ref 135–145)
Total Bilirubin: 0.8 mg/dL (ref 0.3–1.2)
Total Protein: 7.2 g/dL (ref 6.5–8.1)

## 2020-12-20 MED ORDER — GOSERELIN ACETATE 3.6 MG ~~LOC~~ IMPL
3.6000 mg | DRUG_IMPLANT | Freq: Once | SUBCUTANEOUS | Status: AC
Start: 2020-12-20 — End: 2020-12-20
  Administered 2020-12-20: 3.6 mg via SUBCUTANEOUS

## 2020-12-20 MED ORDER — GOSERELIN ACETATE 3.6 MG ~~LOC~~ IMPL
DRUG_IMPLANT | SUBCUTANEOUS | Status: AC
  Filled 2020-12-20: qty 3.6

## 2020-12-20 NOTE — Progress Notes (Signed)
PCP - Dr. Wenda Low  Chest x-ray - Not indicated EKG - Not indicated Stress Test - Denies ECHO - 05/27/20  Sleep Study - Denies  DM - Denies  ERAS Protcol - Yes  COVID TEST- 12/23/20  Anesthesia review: No  Patient denies shortness of breath, fever, cough and chest pain at PAT appointment   All instructions explained to the patient, with a verbal understanding of the material. Patient agrees to go over the instructions while at home for a better understanding. Patient also instructed to wear a mask in public after being tested for COVID-19. The opportunity to ask questions was provided.

## 2020-12-23 ENCOUNTER — Other Ambulatory Visit (HOSPITAL_COMMUNITY)
Admission: RE | Admit: 2020-12-23 | Discharge: 2020-12-23 | Disposition: A | Discharge status: Home / Self Care | Payer: BC Managed Care – PPO | Source: Ambulatory Visit | Attending: Surgery | Admitting: Surgery

## 2020-12-23 DIAGNOSIS — C50911 Malignant neoplasm of unspecified site of right female breast: Secondary | ICD-10-CM | POA: Diagnosis not present

## 2020-12-23 DIAGNOSIS — Z170 Estrogen receptor positive status [ER+]: Secondary | ICD-10-CM | POA: Diagnosis not present

## 2020-12-23 DIAGNOSIS — J449 Chronic obstructive pulmonary disease, unspecified: Secondary | ICD-10-CM | POA: Diagnosis not present

## 2020-12-23 DIAGNOSIS — D0512 Intraductal carcinoma in situ of left breast: Secondary | ICD-10-CM | POA: Diagnosis not present

## 2020-12-23 DIAGNOSIS — C50912 Malignant neoplasm of unspecified site of left female breast: Secondary | ICD-10-CM | POA: Diagnosis not present

## 2020-12-23 DIAGNOSIS — C50212 Malignant neoplasm of upper-inner quadrant of left female breast: Secondary | ICD-10-CM | POA: Diagnosis not present

## 2020-12-23 DIAGNOSIS — Z853 Personal history of malignant neoplasm of breast: Secondary | ICD-10-CM | POA: Diagnosis not present

## 2020-12-23 DIAGNOSIS — Z20822 Contact with and (suspected) exposure to covid-19: Secondary | ICD-10-CM | POA: Diagnosis not present

## 2020-12-23 DIAGNOSIS — N6021 Fibroadenosis of right breast: Secondary | ICD-10-CM | POA: Diagnosis not present

## 2020-12-23 DIAGNOSIS — N6011 Diffuse cystic mastopathy of right breast: Secondary | ICD-10-CM | POA: Diagnosis not present

## 2020-12-23 DIAGNOSIS — G8918 Other acute postprocedural pain: Secondary | ICD-10-CM | POA: Diagnosis not present

## 2020-12-23 DIAGNOSIS — D241 Benign neoplasm of right breast: Secondary | ICD-10-CM | POA: Diagnosis not present

## 2020-12-23 DIAGNOSIS — Z01812 Encounter for preprocedural laboratory examination: Secondary | ICD-10-CM | POA: Insufficient documentation

## 2020-12-23 DIAGNOSIS — N6012 Diffuse cystic mastopathy of left breast: Secondary | ICD-10-CM | POA: Diagnosis not present

## 2020-12-23 DIAGNOSIS — D0511 Intraductal carcinoma in situ of right breast: Secondary | ICD-10-CM | POA: Diagnosis not present

## 2020-12-23 DIAGNOSIS — Z803 Family history of malignant neoplasm of breast: Secondary | ICD-10-CM | POA: Diagnosis not present

## 2020-12-23 LAB — SARS CORONAVIRUS 2 (TAT 6-24 HRS): SARS Coronavirus 2: NEGATIVE

## 2020-12-25 ENCOUNTER — Encounter (HOSPITAL_COMMUNITY): Payer: Self-pay | Admitting: Surgery

## 2020-12-25 NOTE — Anesthesia Preprocedure Evaluation (Addendum)
Anesthesia Evaluation  Patient identified by MRN, date of birth, ID band Patient awake    Reviewed: Allergy & Precautions, NPO status , Patient's Chart, lab work & pertinent test results  History of Anesthesia Complications Negative for: history of anesthetic complications  Airway Mallampati: II  TM Distance: >3 FB Neck ROM: Full    Dental  (+) Teeth Intact, Dental Advisory Given   Pulmonary COPD,  COPD inhaler,  12/23/2020 SARS coronavirus NEG   breath sounds clear to auscultation       Cardiovascular (-) anginanegative cardio ROS   Rhythm:Regular Rate:Normal  05/2020 ECHO: EF 55-60%, normal LVF, Grade 1 DD, no significant valvular abnormalities   Neuro/Psych negative neurological ROS     GI/Hepatic negative GI ROS, Neg liver ROS,   Endo/Other  negative endocrine ROS  Renal/GU negative Renal ROS     Musculoskeletal   Abdominal   Peds  Hematology negative hematology ROS (+)   Anesthesia Other Findings Breast cancer  Reproductive/Obstetrics                            Anesthesia Physical Anesthesia Plan  ASA: 3  Anesthesia Plan: General   Post-op Pain Management: GA combined w/ Regional for post-op pain   Induction: Intravenous  PONV Risk Score and Plan: 3 and Ondansetron, Dexamethasone and Scopolamine patch - Pre-op  Airway Management Planned: Oral ETT  Additional Equipment: None  Intra-op Plan:   Post-operative Plan: Extubation in OR  Informed Consent: I have reviewed the patients History and Physical, chart, labs and discussed the procedure including the risks, benefits and alternatives for the proposed anesthesia with the patient or authorized representative who has indicated his/her understanding and acceptance.     Dental advisory given  Plan Discussed with: CRNA and Surgeon  Anesthesia Plan Comments: (Plan routine monitors, GA with pectoralis blocks for post op  analgesia)       Anesthesia Quick Evaluation

## 2020-12-26 ENCOUNTER — Encounter (HOSPITAL_COMMUNITY): Payer: Self-pay | Admitting: Surgery

## 2020-12-26 ENCOUNTER — Inpatient Hospital Stay (HOSPITAL_COMMUNITY)
Admission: RE | Admit: 2020-12-26 | Discharge: 2020-12-27 | DRG: 583 | Disposition: A | Discharge status: Home / Self Care | Payer: BC Managed Care – PPO | Source: Ambulatory Visit | Attending: Plastic Surgery | Admitting: Plastic Surgery

## 2020-12-26 ENCOUNTER — Encounter (HOSPITAL_COMMUNITY)
Admission: RE | Disposition: A | Discharge status: Home / Self Care | Payer: Self-pay | Source: Ambulatory Visit | Attending: Plastic Surgery

## 2020-12-26 ENCOUNTER — Encounter (HOSPITAL_COMMUNITY)
Admission: RE | Admit: 2020-12-26 | Discharge: 2020-12-26 | Disposition: A | Discharge status: Home / Self Care | Payer: BC Managed Care – PPO | Source: Ambulatory Visit | Attending: Surgery | Admitting: Surgery

## 2020-12-26 ENCOUNTER — Ambulatory Visit (HOSPITAL_COMMUNITY): Payer: BC Managed Care – PPO

## 2020-12-26 ENCOUNTER — Inpatient Hospital Stay (HOSPITAL_COMMUNITY): Payer: BC Managed Care – PPO | Admitting: Anesthesiology

## 2020-12-26 ENCOUNTER — Other Ambulatory Visit: Payer: Self-pay

## 2020-12-26 DIAGNOSIS — C50912 Malignant neoplasm of unspecified site of left female breast: Secondary | ICD-10-CM | POA: Diagnosis not present

## 2020-12-26 DIAGNOSIS — Z803 Family history of malignant neoplasm of breast: Secondary | ICD-10-CM

## 2020-12-26 DIAGNOSIS — D241 Benign neoplasm of right breast: Secondary | ICD-10-CM | POA: Diagnosis not present

## 2020-12-26 DIAGNOSIS — D0512 Intraductal carcinoma in situ of left breast: Secondary | ICD-10-CM

## 2020-12-26 DIAGNOSIS — Z853 Personal history of malignant neoplasm of breast: Secondary | ICD-10-CM

## 2020-12-26 DIAGNOSIS — Z20822 Contact with and (suspected) exposure to covid-19: Secondary | ICD-10-CM | POA: Diagnosis present

## 2020-12-26 DIAGNOSIS — J449 Chronic obstructive pulmonary disease, unspecified: Secondary | ICD-10-CM | POA: Diagnosis present

## 2020-12-26 DIAGNOSIS — D0511 Intraductal carcinoma in situ of right breast: Secondary | ICD-10-CM | POA: Diagnosis not present

## 2020-12-26 HISTORY — PX: NIPPLE SPARING MASTECTOMY: SHX6537

## 2020-12-26 HISTORY — PX: LATISSIMUS FLAP TO BREAST: SHX5357

## 2020-12-26 HISTORY — PX: SENTINEL NODE BIOPSY: SHX6608

## 2020-12-26 HISTORY — PX: BREAST RECONSTRUCTION WITH PLACEMENT OF TISSUE EXPANDER AND ALLODERM: SHX6805

## 2020-12-26 LAB — POCT PREGNANCY, URINE: Preg Test, Ur: NEGATIVE

## 2020-12-26 SURGERY — MASTECTOMY, NIPPLE SPARING
Anesthesia: General | Site: Chest | Laterality: Right

## 2020-12-26 MED ORDER — SCOPOLAMINE 1 MG/3DAYS TD PT72
1.0000 | MEDICATED_PATCH | TRANSDERMAL | Status: DC
  Administered 2020-12-26: 1.5 mg via TRANSDERMAL
  Filled 2020-12-26: qty 1

## 2020-12-26 MED ORDER — CHLORHEXIDINE GLUCONATE 0.12 % MT SOLN
OROMUCOSAL | Status: AC
  Administered 2020-12-26: 15 mL via OROMUCOSAL
  Filled 2020-12-26: qty 15

## 2020-12-26 MED ORDER — CEFAZOLIN SODIUM-DEXTROSE 2-4 GM/100ML-% IV SOLN
2.0000 g | INTRAVENOUS | Status: AC
  Administered 2020-12-26 (×2): 2 g via INTRAVENOUS
  Filled 2020-12-26: qty 100

## 2020-12-26 MED ORDER — OXYCODONE HCL 5 MG PO TABS: 5.0000 mg | ORAL_TABLET | Freq: Once | ORAL | Status: DC | PRN

## 2020-12-26 MED ORDER — ACETAMINOPHEN 500 MG PO TABS
1000.0000 mg | ORAL_TABLET | Freq: Once | ORAL | Status: AC
  Administered 2020-12-26: 1000 mg via ORAL
  Filled 2020-12-26: qty 2

## 2020-12-26 MED ORDER — SODIUM CHLORIDE 0.9 % IV SOLN
Freq: Once | INTRAVENOUS | Status: DC
  Filled 2020-12-26: qty 10

## 2020-12-26 MED ORDER — PROPOFOL 10 MG/ML IV BOLUS
INTRAVENOUS | Status: DC | PRN
  Administered 2020-12-26: 150 mg via INTRAVENOUS
  Administered 2020-12-26: 40 mg via INTRAVENOUS

## 2020-12-26 MED ORDER — PHENYLEPHRINE HCL-NACL 10-0.9 MG/250ML-% IV SOLN
INTRAVENOUS | Status: DC | PRN
  Administered 2020-12-26: 25 ug/min via INTRAVENOUS

## 2020-12-26 MED ORDER — VENLAFAXINE HCL ER 37.5 MG PO CP24
37.5000 mg | ORAL_CAPSULE | Freq: Every day | ORAL | Status: DC
  Administered 2020-12-27: 37.5 mg via ORAL
  Filled 2020-12-26: qty 1

## 2020-12-26 MED ORDER — ENOXAPARIN SODIUM 40 MG/0.4ML IJ SOSY
40.0000 mg | PREFILLED_SYRINGE | INTRAMUSCULAR | Status: DC
  Administered 2020-12-27: 40 mg via SUBCUTANEOUS
  Filled 2020-12-26: qty 0.4

## 2020-12-26 MED ORDER — MESALAMINE 1.2 G PO TBEC
1.2000 g | DELAYED_RELEASE_TABLET | Freq: Every day | ORAL | Status: DC
  Administered 2020-12-26: 1.2 g via ORAL
  Filled 2020-12-26: qty 1

## 2020-12-26 MED ORDER — SODIUM CHLORIDE 0.9 % IV SOLN
INTRAVENOUS | Status: DC | PRN
  Administered 2020-12-26: 500 mL

## 2020-12-26 MED ORDER — HEPARIN SODIUM (PORCINE) 5000 UNIT/ML IJ SOLN
5000.0000 [IU] | INTRAMUSCULAR | Status: AC
  Administered 2020-12-26: 5000 [IU] via SUBCUTANEOUS
  Filled 2020-12-26: qty 1

## 2020-12-26 MED ORDER — LIDOCAINE 2% (20 MG/ML) 5 ML SYRINGE
INTRAMUSCULAR | Status: DC | PRN
  Administered 2020-12-26: 20 mg via INTRAVENOUS

## 2020-12-26 MED ORDER — PROPOFOL 500 MG/50ML IV EMUL
INTRAVENOUS | Status: DC | PRN
  Administered 2020-12-26: 20 ug/kg/min via INTRAVENOUS

## 2020-12-26 MED ORDER — CHLORHEXIDINE GLUCONATE 0.12 % MT SOLN: 15.0000 mL | Freq: Once | OROMUCOSAL | Status: AC

## 2020-12-26 MED ORDER — CEFAZOLIN SODIUM-DEXTROSE 1-4 GM/50ML-% IV SOLN
1.0000 g | Freq: Three times a day (TID) | INTRAVENOUS | Status: DC
  Administered 2020-12-26 – 2020-12-27 (×2): 1 g via INTRAVENOUS
  Filled 2020-12-26 (×3): qty 50

## 2020-12-26 MED ORDER — SUMATRIPTAN SUCCINATE 100 MG PO TABS
100.0000 mg | ORAL_TABLET | ORAL | Status: DC | PRN
  Filled 2020-12-26: qty 1

## 2020-12-26 MED ORDER — MIDAZOLAM HCL 2 MG/2ML IJ SOLN
INTRAMUSCULAR | Status: AC
  Filled 2020-12-26: qty 2

## 2020-12-26 MED ORDER — ALBUTEROL SULFATE HFA 108 (90 BASE) MCG/ACT IN AERS
2.0000 | INHALATION_SPRAY | Freq: Four times a day (QID) | RESPIRATORY_TRACT | Status: DC | PRN
  Filled 2020-12-26: qty 6.7

## 2020-12-26 MED ORDER — ROCURONIUM BROMIDE 10 MG/ML (PF) SYRINGE
PREFILLED_SYRINGE | INTRAVENOUS | Status: DC | PRN
  Administered 2020-12-26: 40 mg via INTRAVENOUS
  Administered 2020-12-26: 10 mg via INTRAVENOUS
  Administered 2020-12-26: 40 mg via INTRAVENOUS
  Administered 2020-12-26: 15 mg via INTRAVENOUS
  Administered 2020-12-26: 60 mg via INTRAVENOUS

## 2020-12-26 MED ORDER — CEFAZOLIN SODIUM 1 G IJ SOLR
INTRAMUSCULAR | Status: AC
  Filled 2020-12-26: qty 20

## 2020-12-26 MED ORDER — MEPERIDINE HCL 25 MG/ML IJ SOLN: 6.2500 mg | INTRAMUSCULAR | Status: DC | PRN

## 2020-12-26 MED ORDER — ONDANSETRON HCL 4 MG/2ML IJ SOLN
INTRAMUSCULAR | Status: DC | PRN
  Administered 2020-12-26: 4 mg via INTRAVENOUS

## 2020-12-26 MED ORDER — ALPRAZOLAM 0.25 MG PO TABS: 0.2500 mg | ORAL_TABLET | Freq: Two times a day (BID) | ORAL | Status: DC | PRN

## 2020-12-26 MED ORDER — MIDAZOLAM HCL 2 MG/2ML IJ SOLN
INTRAMUSCULAR | Status: DC | PRN
  Administered 2020-12-26: 2 mg via INTRAVENOUS
  Administered 2020-12-26 (×2): 1 mg via INTRAVENOUS

## 2020-12-26 MED ORDER — FENTANYL CITRATE (PF) 250 MCG/5ML IJ SOLN
INTRAMUSCULAR | Status: AC
  Filled 2020-12-26: qty 5

## 2020-12-26 MED ORDER — KETOROLAC TROMETHAMINE 30 MG/ML IJ SOLN
30.0000 mg | Freq: Three times a day (TID) | INTRAMUSCULAR | Status: AC
  Administered 2020-12-26 – 2020-12-27 (×3): 30 mg via INTRAVENOUS
  Filled 2020-12-26 (×3): qty 1

## 2020-12-26 MED ORDER — HYDROMORPHONE HCL 1 MG/ML IJ SOLN
INTRAMUSCULAR | Status: AC
  Filled 2020-12-26: qty 1

## 2020-12-26 MED ORDER — HYDROMORPHONE HCL 1 MG/ML IJ SOLN: 0.5000 mg | INTRAMUSCULAR | Status: DC | PRN

## 2020-12-26 MED ORDER — OXYCODONE HCL 5 MG PO TABS
5.0000 mg | ORAL_TABLET | ORAL | Status: DC | PRN
  Administered 2020-12-26 – 2020-12-27 (×4): 10 mg via ORAL
  Filled 2020-12-26 (×4): qty 2

## 2020-12-26 MED ORDER — DEXAMETHASONE SODIUM PHOSPHATE 10 MG/ML IJ SOLN
INTRAMUSCULAR | Status: DC | PRN
  Administered 2020-12-26: 5 mg via INTRAVENOUS

## 2020-12-26 MED ORDER — FENTANYL CITRATE (PF) 250 MCG/5ML IJ SOLN
INTRAMUSCULAR | Status: DC | PRN
  Administered 2020-12-26: 50 ug via INTRAVENOUS
  Administered 2020-12-26: 100 ug via INTRAVENOUS
  Administered 2020-12-26 (×2): 50 ug via INTRAVENOUS
  Administered 2020-12-26: 150 ug via INTRAVENOUS

## 2020-12-26 MED ORDER — BUPIVACAINE-EPINEPHRINE (PF) 0.25% -1:200000 IJ SOLN
INTRAMUSCULAR | Status: DC | PRN
  Administered 2020-12-26 (×2): 25 mL

## 2020-12-26 MED ORDER — CHLORHEXIDINE GLUCONATE CLOTH 2 % EX PADS
6.0000 | MEDICATED_PAD | Freq: Once | CUTANEOUS | Status: DC
Start: 2020-12-26 — End: 2020-12-26

## 2020-12-26 MED ORDER — FLUTICASONE PROPIONATE 50 MCG/ACT NA SUSP
1.0000 | Freq: Every day | NASAL | Status: DC | PRN
  Filled 2020-12-26: qty 16

## 2020-12-26 MED ORDER — MIDAZOLAM HCL 2 MG/2ML IJ SOLN
0.5000 mg | Freq: Once | INTRAMUSCULAR | Status: DC | PRN
Start: 2020-12-26 — End: 2020-12-26

## 2020-12-26 MED ORDER — LACTATED RINGERS IV SOLN: INTRAVENOUS | Status: DC

## 2020-12-26 MED ORDER — KCL IN DEXTROSE-NACL 20-5-0.45 MEQ/L-%-% IV SOLN
INTRAVENOUS | Status: DC
  Filled 2020-12-26: qty 1000

## 2020-12-26 MED ORDER — ORAL CARE MOUTH RINSE: 15.0000 mL | Freq: Once | OROMUCOSAL | Status: AC

## 2020-12-26 MED ORDER — ROCURONIUM BROMIDE 10 MG/ML (PF) SYRINGE
PREFILLED_SYRINGE | INTRAVENOUS | Status: AC
  Filled 2020-12-26: qty 10

## 2020-12-26 MED ORDER — ONDANSETRON HCL 4 MG/2ML IJ SOLN: 4.0000 mg | Freq: Four times a day (QID) | INTRAMUSCULAR | Status: DC | PRN

## 2020-12-26 MED ORDER — PROMETHAZINE HCL 25 MG/ML IJ SOLN: 6.2500 mg | INTRAMUSCULAR | Status: DC | PRN

## 2020-12-26 MED ORDER — SUGAMMADEX SODIUM 200 MG/2ML IV SOLN
INTRAVENOUS | Status: DC | PRN
  Administered 2020-12-26: 100 mg via INTRAVENOUS

## 2020-12-26 MED ORDER — TECHNETIUM TC 99M TILMANOCEPT KIT
1.0000 | PACK | Freq: Once | INTRAVENOUS | Status: AC | PRN
  Administered 2020-12-26: 1 via INTRADERMAL

## 2020-12-26 MED ORDER — 0.9 % SODIUM CHLORIDE (POUR BTL) OPTIME
TOPICAL | Status: DC | PRN
  Administered 2020-12-26: 1000 mL

## 2020-12-26 MED ORDER — OXYCODONE HCL 5 MG/5ML PO SOLN: 5.0000 mg | Freq: Once | ORAL | Status: DC | PRN

## 2020-12-26 MED ORDER — METHOCARBAMOL 500 MG PO TABS
500.0000 mg | ORAL_TABLET | Freq: Four times a day (QID) | ORAL | Status: DC | PRN
  Administered 2020-12-26 – 2020-12-27 (×3): 500 mg via ORAL
  Filled 2020-12-26 (×3): qty 1

## 2020-12-26 MED ORDER — PROPOFOL 10 MG/ML IV BOLUS
INTRAVENOUS | Status: AC
  Filled 2020-12-26: qty 40

## 2020-12-26 MED ORDER — HYDROMORPHONE HCL 1 MG/ML IJ SOLN
0.2500 mg | INTRAMUSCULAR | Status: DC | PRN
  Administered 2020-12-26 (×3): 0.25 mg via INTRAVENOUS

## 2020-12-26 MED ORDER — ONDANSETRON 4 MG PO TBDP: 4.0000 mg | ORAL_TABLET | Freq: Four times a day (QID) | ORAL | Status: DC | PRN

## 2020-12-26 MED ORDER — GABAPENTIN 100 MG PO CAPS
200.0000 mg | ORAL_CAPSULE | Freq: Every day | ORAL | Status: DC
  Administered 2020-12-26: 200 mg via ORAL
  Filled 2020-12-26: qty 2

## 2020-12-26 SURGICAL SUPPLY — 96 items
ALLOGRAFT PERF 16X20 1.6+/-0.4 (Tissue) ×1 IMPLANT
APPLIER CLIP 9.375 MED OPEN (MISCELLANEOUS) ×10
BAG DECANTER FOR FLEXI CONT (MISCELLANEOUS) ×5 IMPLANT
BINDER BREAST LRG (GAUZE/BANDAGES/DRESSINGS) ×1 IMPLANT
BINDER BREAST XLRG (GAUZE/BANDAGES/DRESSINGS) IMPLANT
BLADE SURG 10 STRL SS (BLADE) ×5 IMPLANT
BLADE SURG 15 STRL LF DISP TIS (BLADE) IMPLANT
BLADE SURG 15 STRL SS (BLADE)
BNDG COHESIVE 4X5 TAN STRL (GAUZE/BANDAGES/DRESSINGS) IMPLANT
CANISTER SUCT 3000ML PPV (MISCELLANEOUS) ×10 IMPLANT
CHLORAPREP W/TINT 26 (MISCELLANEOUS) ×10 IMPLANT
CLIP APPLIE 9.375 MED OPEN (MISCELLANEOUS) ×8 IMPLANT
COVER MAYO STAND STRL (DRAPES) ×5 IMPLANT
COVER SURGICAL LIGHT HANDLE (MISCELLANEOUS) ×10 IMPLANT
COVER WAND RF STERILE (DRAPES) ×10 IMPLANT
DERMABOND ADVANCED (GAUZE/BANDAGES/DRESSINGS) ×3
DERMABOND ADVANCED .7 DNX12 (GAUZE/BANDAGES/DRESSINGS) ×12 IMPLANT
DRAIN CHANNEL 15F RND FF W/TCR (WOUND CARE) ×2 IMPLANT
DRAIN CHANNEL 19F RND (DRAIN) ×6 IMPLANT
DRAPE HALF SHEET 40X57 (DRAPES) ×10 IMPLANT
DRAPE INCISE 23X17 IOBAN STRL (DRAPES)
DRAPE INCISE 23X17 STRL (DRAPES) IMPLANT
DRAPE INCISE IOBAN 23X17 STRL (DRAPES) IMPLANT
DRAPE INCISE IOBAN 85X60 (DRAPES) ×5 IMPLANT
DRAPE LAPAROSCOPIC ABDOMINAL (DRAPES) ×5 IMPLANT
DRAPE ORTHO SPLIT 77X108 STRL (DRAPES) ×20
DRAPE SURG ORHT 6 SPLT 77X108 (DRAPES) ×16 IMPLANT
DRAPE UTILITY XL STRL (DRAPES) ×6 IMPLANT
DRAPE WARM FLUID 44X44 (DRAPES) ×5 IMPLANT
DRSG PAD ABDOMINAL 8X10 ST (GAUZE/BANDAGES/DRESSINGS) ×12 IMPLANT
DRSG TEGADERM 2-3/8X2-3/4 SM (GAUZE/BANDAGES/DRESSINGS) ×3 IMPLANT
DRSG TEGADERM 4X4.75 (GAUZE/BANDAGES/DRESSINGS) ×19 IMPLANT
ELECT BLADE 4.0 EZ CLEAN MEGAD (MISCELLANEOUS) ×5
ELECT BLADE 6.5 EXT (BLADE) ×5 IMPLANT
ELECT CAUTERY BLADE 6.4 (BLADE) ×5 IMPLANT
ELECT COATED BLADE 2.86 ST (ELECTRODE) ×5 IMPLANT
ELECT REM PT RETURN 9FT ADLT (ELECTROSURGICAL) ×10
ELECTRODE BLDE 4.0 EZ CLN MEGD (MISCELLANEOUS) ×4 IMPLANT
ELECTRODE REM PT RTRN 9FT ADLT (ELECTROSURGICAL) ×8 IMPLANT
EVACUATOR SILICONE 100CC (DRAIN) ×15 IMPLANT
EXPANDER TISSUE FV FOURTE 400 (Prosthesis & Implant Plastic) IMPLANT
EXPANDER TISSUE MX FOURTE 400 (Prosthesis & Implant Plastic) IMPLANT
GAUZE SPONGE 4X4 12PLY STRL (GAUZE/BANDAGES/DRESSINGS) ×5 IMPLANT
GAUZE SPONGE 4X4 12PLY STRL LF (GAUZE/BANDAGES/DRESSINGS) IMPLANT
GAUZE XEROFORM 5X9 LF (GAUZE/BANDAGES/DRESSINGS) IMPLANT
GLOVE BIO SURGEON STRL SZ 6 (GLOVE) ×15 IMPLANT
GLOVE BIO SURGEON STRL SZ8 (GLOVE) ×5 IMPLANT
GLOVE SRG 8 PF TXTR STRL LF DI (GLOVE) ×4 IMPLANT
GLOVE SURG UNDER POLY LF SZ8 (GLOVE) ×5
GOWN STRL REUS W/ TWL LRG LVL3 (GOWN DISPOSABLE) ×16 IMPLANT
GOWN STRL REUS W/ TWL XL LVL3 (GOWN DISPOSABLE) ×4 IMPLANT
GOWN STRL REUS W/TWL LRG LVL3 (GOWN DISPOSABLE) ×20
GOWN STRL REUS W/TWL XL LVL3 (GOWN DISPOSABLE) ×5
KIT BASIN OR (CUSTOM PROCEDURE TRAY) ×10 IMPLANT
KIT MARKER MARGIN INK (KITS) ×1 IMPLANT
KIT TURNOVER KIT B (KITS) ×10 IMPLANT
MARKER SKIN DUAL TIP RULER LAB (MISCELLANEOUS) ×5 IMPLANT
NDL HYPO 25GX1X1/2 BEV (NEEDLE) ×4 IMPLANT
NEEDLE HYPO 25GX1X1/2 BEV (NEEDLE) ×5 IMPLANT
NS IRRIG 1000ML POUR BTL (IV SOLUTION) ×15 IMPLANT
PACK GENERAL/GYN (CUSTOM PROCEDURE TRAY) ×10 IMPLANT
PAD ARMBOARD 7.5X6 YLW CONV (MISCELLANEOUS) ×20 IMPLANT
PENCIL BUTTON HOLSTER BLD 10FT (ELECTRODE) IMPLANT
PENCIL SMOKE EVACUATOR (MISCELLANEOUS) ×5 IMPLANT
PIN SAFETY STERILE (MISCELLANEOUS) ×5 IMPLANT
SET ASEPTIC TRANSFER (MISCELLANEOUS) ×5 IMPLANT
SET COLLECT BLD 21X3/4 12 (NEEDLE) IMPLANT
SOL PREP POV-IOD 4OZ 10% (MISCELLANEOUS) ×5 IMPLANT
SPECIMEN JAR X LARGE (MISCELLANEOUS) ×5 IMPLANT
SPONGE LAP 18X18 RF (DISPOSABLE) ×1 IMPLANT
STAPLER VISISTAT 35W (STAPLE) ×5 IMPLANT
STOCKINETTE IMPERVIOUS 9X36 MD (GAUZE/BANDAGES/DRESSINGS) IMPLANT
STRIP CLOSURE SKIN 1/2X4 (GAUZE/BANDAGES/DRESSINGS) ×10 IMPLANT
SUT CHROMIC 4 0 PS 2 18 (SUTURE) ×3 IMPLANT
SUT ETHILON 2 0 FS 18 (SUTURE) ×11 IMPLANT
SUT ETHILON 3 0 FSL (SUTURE) ×4 IMPLANT
SUT MNCRL AB 4-0 PS2 18 (SUTURE) ×18 IMPLANT
SUT PDS AB 2-0 CT2 27 (SUTURE) ×11 IMPLANT
SUT VIC AB 0 CT2 27 (SUTURE) ×2 IMPLANT
SUT VIC AB 3-0 SH 18 (SUTURE) ×7 IMPLANT
SUT VIC AB 3-0 SH 27 (SUTURE) ×10
SUT VIC AB 3-0 SH 27X BRD (SUTURE) ×8 IMPLANT
SUT VICRYL 4-0 PS2 18IN ABS (SUTURE) IMPLANT
SUT VLOC 180 0 24IN GS25 (SUTURE) ×2 IMPLANT
SYR 50ML SLIP (SYRINGE) IMPLANT
SYR BULB IRRIG 60ML STRL (SYRINGE) ×5 IMPLANT
SYR CONTROL 10ML LL (SYRINGE) ×5 IMPLANT
TISSUE EXPNDR FV FOURTE 400 (Prosthesis & Implant Plastic) ×5 IMPLANT
TISSUE EXPNDR MX FOURTE 400 (Prosthesis & Implant Plastic) ×5 IMPLANT
TOWEL GREEN STERILE (TOWEL DISPOSABLE) ×10 IMPLANT
TOWEL GREEN STERILE FF (TOWEL DISPOSABLE) ×10 IMPLANT
TRAY FOL W/BAG SLVR 16FR STRL (SET/KITS/TRAYS/PACK) IMPLANT
TRAY FOLEY MTR SLVR 14FR STAT (SET/KITS/TRAYS/PACK) ×5 IMPLANT
TRAY FOLEY MTR SLVR 16FR STAT (SET/KITS/TRAYS/PACK) IMPLANT
TRAY FOLEY W/BAG SLVR 16FR LF (SET/KITS/TRAYS/PACK) ×5
TUBE CONNECTING 12X1/4 (SUCTIONS) ×5 IMPLANT

## 2020-12-26 NOTE — Op Note (Signed)
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 Erroll Luna, MD  Assistant: Carmell Austria MD   Anesthesia: General with lateral pectoral blocks  Specimen: Bilateral breast to pathology and 2 right axillary sentinel lymph nodes  IV fluids: Per anesthesia record  EBL: 80 cc  Indications for procedure: The patient is a 50 year old female with a history of left breast cancer.  She has had multiple breast biopsies.  She underwent a recent biopsy of right breast which showed DCIS.  After lengthy discussion she opted for risk reducing surgery to treat her DCIS as well as her history of multiple breast biopsies of breast cancer.  Risks and benefits of surgery discussed.  We discussed long-term issues such as survival and how this would not have a major impact on that but would reduce her risk.  She was seen by plastic surgery and opted to undergo bilateral simple mastectomies.  She will need a latissimus flap and left and implant on the right.  She had previous radiation on the left.  She opted out of nipple preservation after lengthy discussion research.The surgical and non surgical options have been discussed with the patient.  Risks of surgery include bleeding,  Infection,  Flap necrosis,  Tissue loss,  Chronic pain, death, Numbness,  And the need for additional procedures.  Reconstruction options also have been discussed with the patient as well.  The patient agrees to proceed.    Description of procedure: The patient was met in the holding area.  Questions were answered.  She underwent pectoral block bilaterally.  She underwent injection of her right breast with technetium sulfur colloid for mapping purposes.  Once questions were answered she was taken back to the operating.  She is placed supine upon the OR table.  After  induction of general esthesia, both breasts were prepped and draped in a sterile fashion timeout performed.  Proper patient, site and procedure were verified as well as appropriate antibiotic dosing.  Neoprobe was used to identify the hotspot in the right axilla.  A 3 cm incision was made.  Dissection was carried down and there were 2 hot level 1 sentinel nodes that were identified and removed.  These were the did not deep lymph node basin.  Background counts approached baseline.  Wound was irrigated and found to be hemostatic.  Was then closed with 3-0 Vicryl and 4-0 Monocryl.  The right mastectomy was then done.  A triangular incision was made around the nipple areolar complex.  Skin flaps were then raised to the midline, clavicle superiorly and inferior mammary fold inferiorly.  Once the skin flaps were raised the breast was dissected off the pectoralis muscle in a medial to lateral fashion.  Specimen was passed off the field.  Skin flaps are viable and there is no evidence of any significant bleeding.  Any small bleeding vessels were controlled with cautery.  Left side was then done.  With a similar triangular incision around the nipple areolar complex skin flaps were raised to the midline, inframammary fold, laterally and then to the clavicle.  The breast was then dissected off the chest wall in a medial lateral fashion removing all tissue.  Hemostasis was achieved with cautery.  At this point time once hemostasis was achieved plastic surgery took over the case to complete the reconstruction.  Please see their note for any additional details.  At this point all counts were correct.  EBL was 80 cc.  Skin flaps are viable.

## 2020-12-26 NOTE — Interval H&P Note (Signed)
History and Physical Interval Note:  12/26/2020 7:17 AM  Chelsea Hobbs  has presented today for surgery, with the diagnosis of RIGHT DCIS AND HIGH RISK LEFT BREAST CANCER.   Pt consented for bilateral skin sparring mastectomy and right sentinel lymph node mapping with reconstruction The various methods of treatment have been discussed with the patient and family. After consideration of risks, benefits and other options for treatment, the patient has consented to   as a surgical intervention.  The patient's history has been reviewed, patient examined, no change in status, stable for surgery.  I have reviewed the patient's chart and labs.  Questions were answered to the patient's satisfaction.     Turner Daniels MD

## 2020-12-26 NOTE — Anesthesia Procedure Notes (Signed)
Anesthesia Regional Block: Pectoralis block   Pre-Anesthetic Checklist: , timeout performed,  Correct Patient, Correct Site, Correct Laterality,  Correct Procedure, Correct Position, site marked,  Risks and benefits discussed,  Surgical consent,  Pre-op evaluation,  At surgeon's request and post-op pain management  Laterality: Left  Prep: chloraprep       Needles:  Injection technique: Single-shot  Needle Type: Echogenic Needle     Needle Length: 9cm  Needle Gauge: 21     Additional Needles:   Procedures:,,,, ultrasound used (permanent image in chart),,    Narrative:  Start time: 12/26/2020 7:02 AM End time: 12/26/2020 7:08 AM Injection made incrementally with aspirations every 5 mL.  Performed by: Personally  Anesthesiologist: Annye Asa, MD  Additional Notes: Pt identified in Holding room.  Monitors applied. Working IV access confirmed. Sterile prep L clavicle and pec.  #21ga Arrow Block needle between pec minor and serratus, between ribs 4,5 with US guidance.  25cc 0.25% Bupivacaine with 1:200k epi injected incrementally after negative test dose.  Patient asymptomatic, VSS, no heme aspirated, tolerated well.   Jenita Seashore, MD

## 2020-12-26 NOTE — Plan of Care (Signed)
  Problem: Education: Goal: Knowledge of General Education information will improve Description: Including pain rating scale, medication(s)/side effects and non-pharmacologic comfort measures Outcome: Progressing   Problem: Activity: Goal: Risk for activity intolerance will decrease Outcome: Progressing   Problem: Nutrition: Goal: Adequate nutrition will be maintained Outcome: Progressing   Problem: Elimination: Goal: Will not experience complications related to urinary retention Outcome: Progressing   Problem: Pain Managment: Goal: General experience of comfort will improve Outcome: Progressing   

## 2020-12-26 NOTE — Op Note (Signed)
Operative Note   DATE OF OPERATION: 6.16.22  LOCATION: [facility]-inpatient  SURGICAL DIVISION: Plastic Surgery  PREOPERATIVE DIAGNOSES:  1. Right breast DCIS 2. History left breast cancer 3. History therapeutic radiation  POSTOPERATIVE DIAGNOSES:  same  PROCEDURE:  1. Bilateral breast reconstruction with tissue expanders 2. Acellular dermis (Alloderm) to right chest 3. Latissimus dorsi flap to left chest  SURGEON: Irene Limbo MD MBA  ASSISTANT: Gentry Fitz RNFA  ANESTHESIA:  General.   EBL: 75 ml for entire procedure  COMPLICATIONS: None immediate.   INDICATIONS FOR PROCEDURE:  The patient, Chelsea Hobbs, is a 50 y.o. female born on Oct 24, 1970, is here for immediate expander based reconstruction following bilateral skin sparing mastectomies. Patient has a history of left chest radiation and plan left latissimus dorsi flap to chest.   FINDINGS: Natrelle tissue expanders placed bilateral. RIGHT 133S FV-12 T 400 ml tissue expander placed initial fill volume 180 ml air. SN [id number] LEFT 133S MX-12-400 ml tissue expander placed initial fill volume 50 ml air SN [id number]  DESCRIPTION OF PROCEDURE:  The patient was marked with the patient in the preoperative area to mark sternal notch, chest midline, anterior axillary lines and inframammary folds. Patient was marked for skin reduction mastectomy with with most superior portion nipple areola marked on breast meridian over left breast. Vertical limbs marked by breast displacement and set at 8 cm length. SQ heparin administered. The patient was taken to the operating room. SCDs were placed and IV antibiotics were given. Foley catheter placed. The patient's operative site was prepped and draped in a sterile fashion. A time out was performed and all information was confirmed to be correct. In supine position, the lateral limbs for resection marked over left breast and area over lower pole preserved as inferiorly based dermal pedicle. Skin de  epithelialized in this area. Over right breast, vertical elliptical incision designed for mastectomy. I assisted in mastectomies in retraction and exposure. Following completion of mastectomies, reconstruction began on right side.   The cavity was irrigated with solution containing Ancef, Betadine, and gentamicin. Hemostasis was ensured. A 19 Fr drain was placed in subcutaneous position laterally and a 15 Fr drain placed along inframammary fold. Each secured to skin with 2-0 nylon. The tissue expander was prepared on back table prior in insertion. The expander was filled with air to 180 ml. Perforated acellular dermis was  draped over anterior surface expander. The ADM was then secured to itself over posterior surface of expander with 4-0 chromic. Redundant folds acellular dermis excised so that the ADM lay flat without folds over air filled expander. The expander was secured to medial insertion pectoralis with a 0 vicryl. The superior and lateral tabs also secured to pectoralis muscle with 0-vicryl. The ADM was secured to pectoralis muscle and chest wall along inferior border at inframammary fold. Laterally the mastectomy flap over posterior axillary line was advanced anteriorly and the subcutaneous tissue and superficial fascia was secured to pectoralis muscle and acellular dermis with 0-vicryl. Skin closure completed with 3-0 vicryl in fascial layer and 4-0 vicryl in dermis. Skin closure completed with 4-0 monocryl subcuticular and tissue adhesive.   Left chest incision stapled closed. The patient was placed into right lateral position. Patient prepped and draped. Time out performed. Elliptical skin paddle designed left back and incision made around skin paddle. Skin and superficial fascia elevated off surface of latissimus muscle and subcutaneous tunnel dissected to axilla joining anterior breast cavity. Anterior border of latissimus identified and elevated. Muscle divided  inferiorly at superior iliac spine.  Submuscular dissection completed toward midline back and toward origin. Flap rotated into anterior chest cavity. Back irrigated and hemostasis ensured. 15 Fr drain placed and secured with 2-0 nylon. 2-0 PDS used to placed quilting sutures from elevated skin flaps to chest wall. Incision closed with 0 V lock suture in superficial fascia and dermis. Skin closure completed with 4-0 monocryl subcuticular and tissue glue applied.   Patient repositioned into supine position. Patient re prepped and draped. Additional time out performed.    Thoracodorsal nerve was identified and divided. Left breast cavity was irrigated with solution containing Ancef, gentamicin, and Betadine. Hemostasis was ensured. The muscle was oriented and sutured to pectoralis muscle and chest wall with 2-0 PDS suture in figure of eight fashion. A 19 Fr drain was placed in breast cavity and secured to skin with 2-0 nylon. The tissue expander was prepared and placed beneath latissimus muscle. The inferior latissimus was secured to superficial fascia and chest wall with 2-0 PDS interrupted. Skin paddle of latissimus flap discarded. The inferiorly based dermal pedicle was redraped superiorly and secured to latissimus with interrupted 2-0 PDS. Skin closure completed with 3-0 vicryl in fascial layer and 4-0 vicryl in dermis. Skin closure completed with 4-0 monocryl subcuticular and tissue adhesive. Left axillary incision closed with 3-0 vicryl in superficial fascia, and skin closure completed with 4-0 monocryl subcuticular, and tissue adhesive applied. The expander port was identified and accessed, total 50 ml air infiltrated.   Tegaderms applied to bilateral chest. Dry dressing and breast binder applied. The patient was allowed to wake from anesthesia, extubated and taken to the recovery room in satisfactory condition.   SPECIMENS: additional left breast tissue  DRAINS: 15 and 13 Fr JP in right breast reconstruction, 19 Fr JP in left breast  reconstruction, 15 Fr JP in left back

## 2020-12-26 NOTE — Interval H&P Note (Signed)
History and Physical Interval Note:  12/26/2020 6:50 AM  Chelsea Hobbs  has presented today for surgery, with the diagnosis of RIGHT DCIS AND HIGH RISK LEFT BREAST CANCER.  The various methods of treatment have been discussed with the patient and family. After consideration of risks, benefits and other options for treatment, the patient has consented to  bilateral breast reconstruction with tissue expanders acellular dermis to right chest and left latissimus dorsi flap to left chest as a surgical intervention.  The patient's history has been reviewed, patient examined, no change in status, stable for surgery.  I have reviewed the patient's chart and labs.  Questions were answered to the patient's satisfaction.    Patient has elected for resection nipple areola complexes as part of mastectomies. Reviewed scars anticipated.  Arnoldo Hooker Amyla Heffner

## 2020-12-26 NOTE — Anesthesia Procedure Notes (Signed)
Procedure Name: Intubation Date/Time: 12/26/2020 7:42 AM Performed by: Rande Brunt, CRNA Pre-anesthesia Checklist: Patient identified, Emergency Drugs available, Suction available and Patient being monitored Patient Re-evaluated:Patient Re-evaluated prior to induction Oxygen Delivery Method: Circle System Utilized Preoxygenation: Pre-oxygenation with 100% oxygen Induction Type: IV induction Ventilation: Mask ventilation without difficulty Laryngoscope Size: Mac and 3 Grade View: Grade I Tube type: Oral Tube size: 7.0 mm Number of attempts: 1 Airway Equipment and Method: Stylet Placement Confirmation: ETT inserted through vocal cords under direct vision, positive ETCO2 and breath sounds checked- equal and bilateral Secured at: 21 cm Tube secured with: Tape Dental Injury: Teeth and Oropharynx as per pre-operative assessment

## 2020-12-26 NOTE — Anesthesia Postprocedure Evaluation (Signed)
Anesthesia Post Note  Patient: Chelsea Hobbs  Procedure(s) Performed: BILATERAL SKIN SPARING MASTECTOMIES (Bilateral: Breast) RIGHT AXILLARY SENTINEL LYMPH NODE BIOPSY (Right: Breast) RIGHT BREAST RECONSTRUCTION WITH PLACEMENT OF TISSUE EXPANDER AND ALLODERM (Right: Chest) LEFT BREAST RECONSTRUCTION WITH LATISSIMUS FLAP AND PLACEMENT OF TISSUE EXPANDER (Left: Chest)     Patient location during evaluation: PACU Anesthesia Type: General Level of consciousness: patient cooperative, oriented and sedated Pain management: pain level controlled Vital Signs Assessment: post-procedure vital signs reviewed and stable Respiratory status: spontaneous breathing, nonlabored ventilation and respiratory function stable Cardiovascular status: blood pressure returned to baseline and stable Postop Assessment: no apparent nausea or vomiting Anesthetic complications: no   No notable events documented.  Last Vitals:  Vitals:   12/26/20 1455 12/26/20 1510  BP: 92/63 102/73  Pulse: 65 81  Resp: 13 17  Temp: 36.4 C   SpO2: 98% 98%    Last Pain:  Vitals:   12/26/20 1355  TempSrc:   PainSc: 5                  Sarya Linenberger,E. Cova Knieriem

## 2020-12-26 NOTE — Anesthesia Procedure Notes (Signed)
Anesthesia Regional Block: Pectoralis block   Pre-Anesthetic Checklist: , timeout performed,  Correct Patient, Correct Site, Correct Laterality,  Correct Procedure, Correct Position, site marked,  Risks and benefits discussed,  Surgical consent,  Pre-op evaluation,  At surgeon's request and post-op pain management  Laterality: Right  Prep: chloraprep       Needles:  Injection technique: Single-shot  Needle Type: Echogenic Needle     Needle Length: 9cm  Needle Gauge: 21     Additional Needles:   Procedures:,,,, ultrasound used (permanent image in chart),,    Narrative:  Start time: 12/26/2020 7:09 AM End time: 12/26/2020 7:11 AM Injection made incrementally with aspirations every 5 mL.  Performed by: Personally  Anesthesiologist: Annye Asa, MD  Additional Notes: Pt identified in Holding room.  Monitors applied. Working IV access confirmed. Sterile prep R clavicle and pec.  #21ga Arrow block needle between pec minor and serratus, between ribs 4,5 with US guidance.  25cc 0.5% Bupivacaine with 1:200k epi injected incrementally after negative test dose.  Patient asymptomatic, VSS, no heme aspirated, tolerated well.   Jenita Seashore, MD

## 2020-12-26 NOTE — Transfer of Care (Signed)
Immediate Anesthesia Transfer of Care Note  Patient: Chelsea Hobbs  Procedure(s) Performed: BILATERAL SKIN SPARING MASTECTOMIES (Bilateral: Breast) RIGHT AXILLARY SENTINEL LYMPH NODE BIOPSY (Right: Breast) RIGHT BREAST RECONSTRUCTION WITH PLACEMENT OF TISSUE EXPANDER AND ALLODERM (Right: Chest) LEFT BREAST RECONSTRUCTION WITH LATISSIMUS FLAP AND PLACEMENT OF TISSUE EXPANDER (Left: Chest)  Patient Location: PACU  Anesthesia Type:General  And regional  Level of Consciousness: awake, alert , drowsy and patient cooperative  Airway & Oxygen Therapy: Patient Spontanous Breathing  Post-op Assessment: Report given to RN, Post -op Vital signs reviewed and stable and Patient moving all extremities  Post vital signs: Reviewed and stable  Last Vitals:  Vitals Value Taken Time  BP 115/80 12/26/20 1355  Temp    Pulse 93 12/26/20 1358  Resp 19 12/26/20 1358  SpO2 100 % 12/26/20 1358  Vitals shown include unvalidated device data.  Last Pain:  Vitals:   12/26/20 0622  TempSrc:   PainSc: 0-No pain         Complications: No notable events documented.

## 2020-12-26 NOTE — H&P (Signed)
Chelsea Hobbs  Location: [facility] Surgery Patient #: [id number] DOB: December 31, 1970 Married / Language: English / Race: White Female   History of Present Illness (Patient words: Patient returns for follow-up of a new mammographic finding of calcifications in her right breast.  Patient has a history of left breast cancer treated in 2020 with left breast lumpectomy with lymphatic mapping.  She also had radiation chemotherapy for this.  She has had 2 other biopsies between her initial breast cancer surgery now.  Magnetic resonance imaging was done for screening purposes which showed 2 x  3 cm area lower right breast of non-mass like enhancement.  Right breast lower outer quadrant consistent with low -grade DCIS per core biopsy.  Area is about a centimeter maximal diameter.         Diagnosis Breast, right, needle core biopsy, inferior - DUCTAL CARCINOMA IN SITU, SEE COMMENT. 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                 CLINICAL DATA:  50 year old female for screening breast MRI with high lifetime risk of developing future breast cancer. History of LEFT breast cancer and lumpectomy in 2020. History of RIGHT breast papilloma with excision in 2021. History of remote benign LOWER RIGHT breast biopsy (COIL biopsy clip) in 2008.   LABS:  None performed today   EXAM: BILATERAL BREAST MRI WITH AND WITHOUT CONTRAST   TECHNIQUE: Multiplanar, multisequence MR images of both breasts were obtained prior to and following the intravenous administration of 6 ml of Gadavist   Three-dimensional MR images were rendered by post-processing of the original MR data on an independent workstation. The three-dimensional MR images were interpreted, and findings are reported in the following complete MRI report for this study. Three dimensional images were evaluated at the independent interpreting workstation using the DynaCAD thin  client.   COMPARISON:  03/31/2019 breast MR, and 03/15/2020 and prior mammograms   FINDINGS: Breast composition: c. Heterogeneous fibroglandular tissue.   Background parenchymal enhancement: Mild   Right breast: A 2 x 3 cm area of persistent non masslike enhancement within the LOWER RIGHT breast is identified (series 12: Image 102). Biopsy clip artifact (COIL clip on mammograms) from remote LOWER RIGHT breast biopsy in 2008 lies anterior to this non masslike enhancement.   Surgical changes within the RIGHT breast are noted.   Left breast: No suspicious mass or abnormal enhancement. Lumpectomy changes are identified.   Lymph nodes: No abnormal appearing lymph nodes.   Ancillary findings:  None.   IMPRESSION: 1. Indeterminate 2 x 3 cm LOWER RIGHT breast non masslike enhancement. Tissue sampling is recommended. 2. No abnormal appearing lymph nodes. 3. No MR evidence of LEFT breast malignancy. Surgical changes within both breasts.   RECOMMENDATION: MR guided RIGHT breast biopsy.   BI-RADS CATEGORY  4: Suspicious.     Electronically Signed  By: Margarette Canada M.D.  On: 10/21/2020 10:36.   The patient is a 50 year old female.     Vitals  Weight: 130.5 lb   Height: 62 in  Body Surface Area: 1.59 m   Body Mass Index: 23.87 kg/m   Temp.: 97.7 F    Pulse: 110 (Regular)    Resp.: 18 (Unlabored)    P.OX: 98% (Room air) BP: 124/68(Sitting, Left Arm, Standard)               Physical Exam   General Mental Status - Alert. General Appearance - Consistent with stated  age. Hydration - Well hydrated. Voice - Normal.   Chest and Lung Exam Chest and lung exam reveals  - quiet, even and easy respiratory effort with no use of accessory muscles and on auscultation, normal breath sounds, no adventitious sounds and normal vocal resonance. Inspection Chest Wall - Normal. Back - normal.   Breast Note:  Left breast shows postsurgical and postradiation changes.  Right breast  shows bruising from core biopsy.  Masses in either breast No Discharge.  Bilaterally   Cardiovascular Cardiovascular examination reveals  - on palpation PMI is normal in location and amplitude, no palpable S3 or S4. Normal cardiac borders., normal heart sounds, regular rate and rhythm with no murmurs, carotid auscultation reveals no bruits and normal pedal pulses bilaterally.   Neurologic Neurologic evaluation reveals  - alert and oriented x 3 with no impairment of recent or remote memory. Mental Status - Normal.       Assessment & Plan    BREAST CANCER, LEFT (C50.912) Impression: No evidence of recurrent disease   Current Plans You are being scheduled for surgery - Our schedulers will call you.    You should hear from our office's scheduling department within 5 working days about the location, date, and time of surgery.  We try to make accommodations for patient's preferences in scheduling surgery, but sometimes the OR schedule or the surgeon's schedule prevents [place] from making those accommodations.   If you have not heard from our office [phone]) in 5 working days, call the office and ask for your surgeon's nurse.   If you have other questions about your diagnosis, plan, or surgery, call the office and ask for your surgeon's nurse.   Pt Education - [facility] Breast Cancer Information Given - [employer] "Breast Journey" Package Pt Education - [facility] Mastectomy HCI Pt Education - [facility] (After Breast Cancer) Class Info: discussed with patient and provided information.   BREAST NEOPLASM, TIS (DCIS), RIGHT (D05.11) Impression: Patient has chosen bilateral skin sparing mastectomy  over NSM due to history and multiple breast biopsies. She has seen plastic surgery.  Recommend right axillary sentinel mapping. Discussed the procedure great length with her. Discussed potential complications of bleeding, infection, use of implants, lymphedema risk on the right, and potential other treatment options or  procedures necessary depending on how the skin heals. Discussed potential for skin loss  Discussed treatment options for breast cancer to include breast conservation vs mastectomy with reconstruction. Pt has decided on mastectomy. Risk include bleeding, infection, flap necrosis, pain, numbness, recurrence, hematoma, other surgery needs. Pt understands and agrees to proceed. Risk of sentinel lymph node mapping include bleeding, infection, lymphedema, shoulder pain. stiffness, dye allergy. cosmetic deformity , blood clots, death, need for more surgery. Pt agres to proceed.

## 2020-12-27 ENCOUNTER — Encounter (HOSPITAL_COMMUNITY): Payer: Self-pay | Admitting: Surgery

## 2020-12-27 NOTE — Plan of Care (Signed)

## 2020-12-27 NOTE — Discharge Summary (Signed)
Physician Discharge Summary  Patient ID: Chelsea Hobbs MRN: [id number] DOB/AGE: 07-27-1970 50 y.o.  Admit date: 12/26/2020 Discharge date: 12/27/2020  Admission Diagnoses: DCIS right breast history breast cancer history therapeutic radiation  Discharge Diagnoses:  same  Discharged Condition: stable  Hospital Course: Post operatively patient did well with pain controlled, ambulatory with minimal assist and tolerating diet. Instructed on bathing and drain care.  Treatments: surgery: bilateral mastectomies right sentinel node bilateral breast reconstruction with tissue expanders left latissimus dorsi flap acellular dermis to right chest 6.16.22  Discharge Exam: Blood pressure 114/69, pulse 72, temperature 98.3 F (36.8 C), temperature source Oral, resp. rate 18, height 5\' 2"  (1.575 m), weight 61.2 kg, SpO2 99 %. Incision/Wound: chest soft incisions dry intact with Tegaderms in place drains serosanguinous back flat incisions intact  Disposition: Discharge disposition: 01-Home or Self Care       Discharge Instructions     Call MD for:  redness, tenderness, or signs of infection (pain, swelling, bleeding, redness, odor or green/yellow discharge around incision site)   Complete by: As directed    Call MD for:  temperature >100.5   Complete by: As directed    Discharge instructions   Complete by: As directed    Ok to remove dressings and shower am 6.18.22. Soap and water ok, pat Tegaderms dry. Do not remove Tegaderms No creams or ointments over incisions. Do not let drains dangle in shower, attach to lanyard or similar.Strip and record drains twice daily and bring log to clinic visit.  Breast binder or soft compression bra all other times.  Ok to raise arms above shoulders for bathing and dressing.  No house yard work or exercise until cleared by MD.   Patient received all Rx preop. Recommend ibuprofen with meals to aid with pain control. Also ok to use Tylenol for pain. Recommend  miralax or Dulcolax as needed for constipation   Driving Restrictions   Complete by: As directed    No driving for 2 weeks then no driving if taking narcotics   Lifting restrictions   Complete by: As directed    No lifting > 5 lbs until cleared by MD   Resume previous diet   Complete by: As directed       Allergies as of 12/27/2020       Reactions   Gadolinium Derivatives Hives, Other (See Comments)   Pt started wheezing after contrast/ runny nose/ 1 hive on arm/ Dr Tery Sanfilippo checked pt condition/ pt has an inhaler so used her inhaler and felt better/ no medication given / pt was instructed to wait at [place] for 30 min to be reassessed by Dr.//jv    Latex Dermatitis   Tetracycline Hcl    Other reaction(s): diarrhea        Medication List     TAKE these medications    albuterol 108 (90 Base) MCG/ACT inhaler Commonly known as: VENTOLIN HFA Inhale 2 puffs into the lungs every 6 (six) hours as needed for shortness of breath.   ALPRAZolam 0.5 MG tablet Commonly known as: XANAX Take 0.25 mg by mouth 2 (two) times daily as needed for anxiety.   anastrozole 1 MG tablet Commonly known as: ARIMIDEX TAKE 1 TABLET BY MOUTH EVERY DAY   CALCIUM MAGNESIUM PO Take 1,200 mg by mouth daily.   fexofenadine 180 MG tablet Commonly known as: ALLEGRA Take 180 mg by mouth daily as needed for allergies.   fluticasone 50 MCG/ACT nasal spray Commonly known as: Ione  1 spray into both nostrils daily as needed for allergies.   gabapentin 100 MG capsule Commonly known as: NEURONTIN Take 2 capsules (200 mg total) by mouth at bedtime.   mesalamine 1.2 g EC tablet Commonly known as: LIALDA Take 1.2 g by mouth at bedtime.   methocarbamol 500 MG tablet Commonly known as: ROBAXIN Take 500 mg by mouth 3 (three) times daily as needed.   montelukast 10 MG tablet Commonly known as: SINGULAIR Take 1 tablet by mouth daily.   multivitamin with minerals tablet Take 1 tablet  by mouth daily.   Omega-3 1000 MG Caps Take 1,000 mg by mouth daily.   oxyCODONE 5 MG immediate release tablet Commonly known as: Oxy IR/ROXICODONE Take 5 mg by mouth every 4 (four) hours as needed.   sulfamethoxazole-trimethoprim 800-160 MG tablet Commonly known as: BACTRIM DS Take 1 tablet by mouth 2 (two) times daily.   SUMAtriptan 100 MG tablet Commonly known as: IMITREX Take 100 mg by mouth every 2 (two) hours as needed for migraine.   vitamin C 1000 MG tablet Take 2,000 mg by mouth daily. Lysine   Vitamin D 125 MCG (5000 UT) Caps Take 5,000 Units by mouth daily.   Zinc 25 MG Tabs Take 25 mg by mouth daily.       ASK your doctor about these medications    venlafaxine XR 37.5 MG 24 hr capsule Commonly known as: EFFEXOR-XR TAKE 1 CAPSULE BY MOUTH DAILY WITH BREAKFAST.        Follow-up Information     Cornett, [name], MD. Schedule an appointment as soon as possible for a visit in 2 week(s).   Specialty: General Surgery Contact information: [address] [place] [address] [phone]         Irene Limbo, MD Follow up in 1 week(s).   Specialty: Plastic Surgery Why: as scheduled Contact information: [address] [place] [address] [phone]                 Signed: Irene Limbo 12/27/2020, 7:20 AM

## 2020-12-31 ENCOUNTER — Telehealth: Payer: Self-pay | Admitting: Hematology

## 2020-12-31 NOTE — Telephone Encounter (Signed)
Rescheduled upcoming appointment due to provider's PAL. Patient is aware of changes. ?

## 2021-01-01 LAB — SURGICAL PATHOLOGY

## 2021-01-02 ENCOUNTER — Encounter: Payer: Self-pay | Admitting: Plastic Surgery

## 2021-01-15 ENCOUNTER — Ambulatory Visit: Payer: BC Managed Care – PPO

## 2021-01-15 ENCOUNTER — Ambulatory Visit: Payer: BC Managed Care – PPO | Admitting: Hematology

## 2021-01-15 IMAGING — MG MM BREAST LOCALIZATION CLIP
4 series · 4 of 12 positions shown · non-contrast
Comparison: Previous exam(s).

CLINICAL DATA: Post ultrasound-guided biopsy of a mass in the left
breast at the 10 o'clock position and ultrasound-guided biopsy of a
mass in the left breast at the [DATE] position.

EXAM:
DIAGNOSTIC LEFT MAMMOGRAM POST ULTRASOUND BIOPSY

[L CC synth-2D]
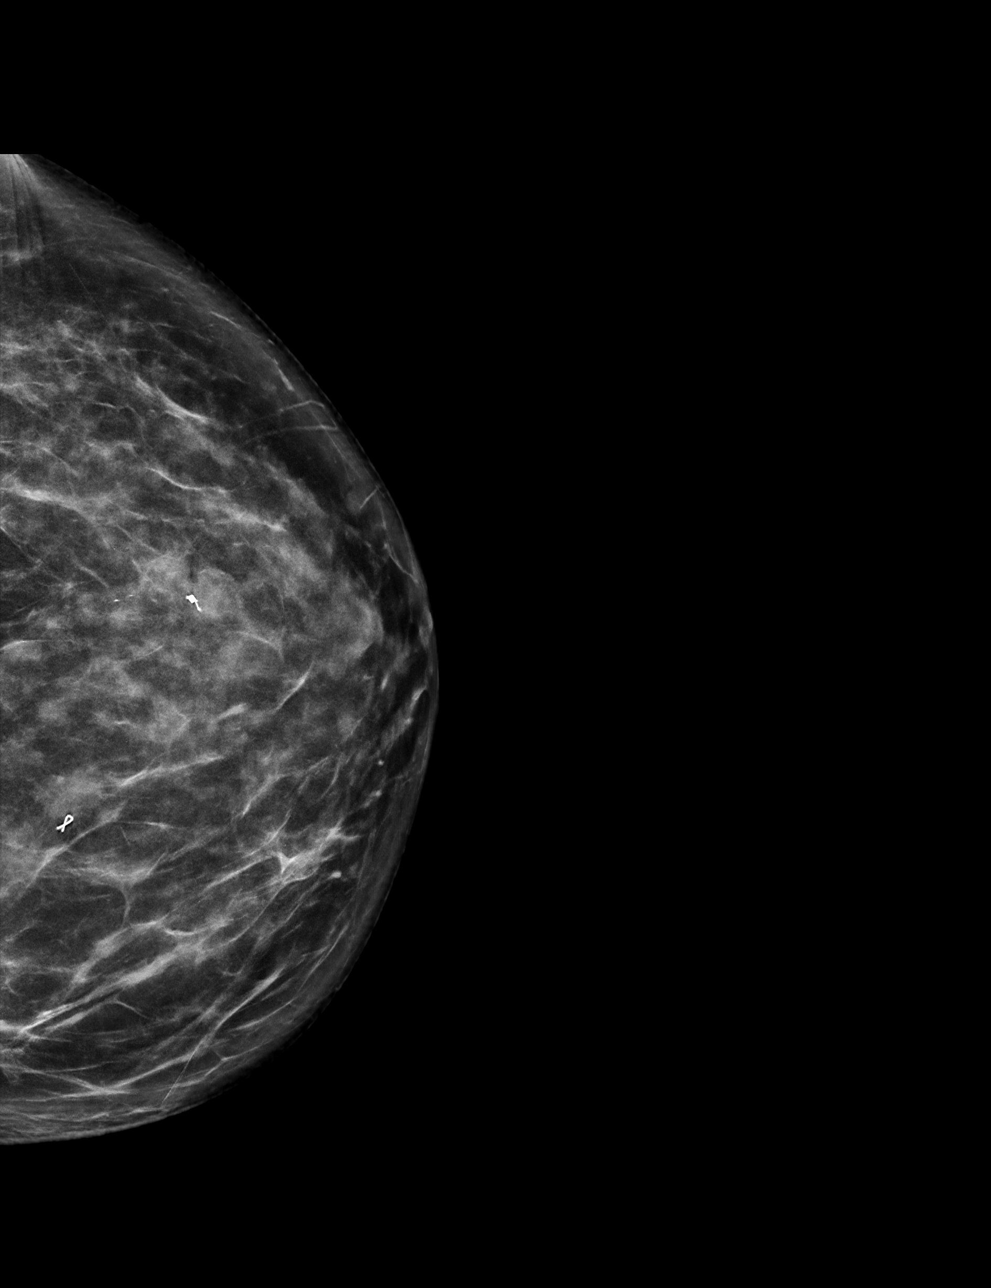

[L ML synth-2D]
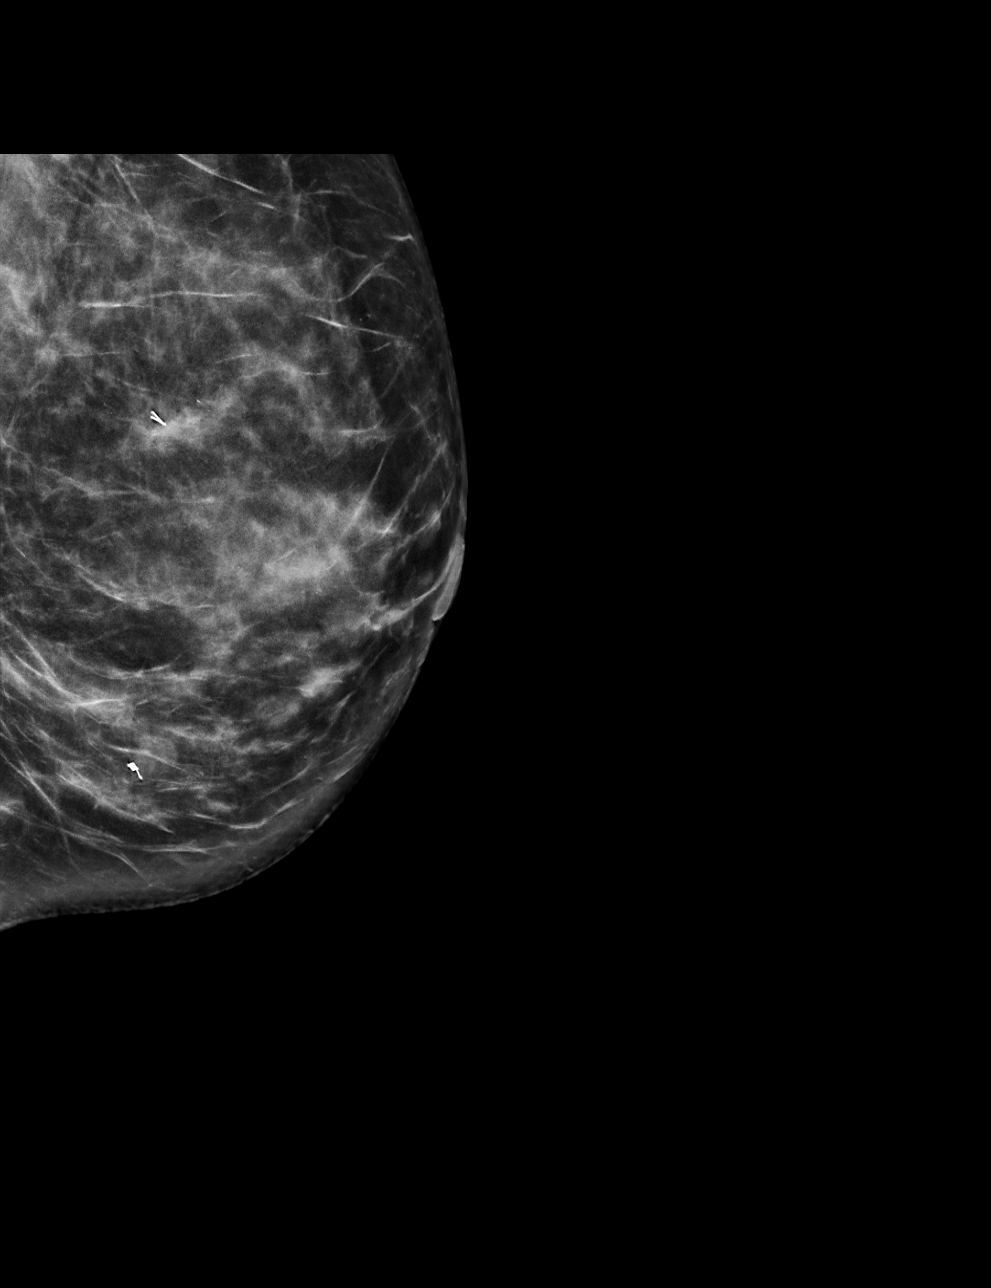

[L CC tomo · tomo slice 35/70.0]
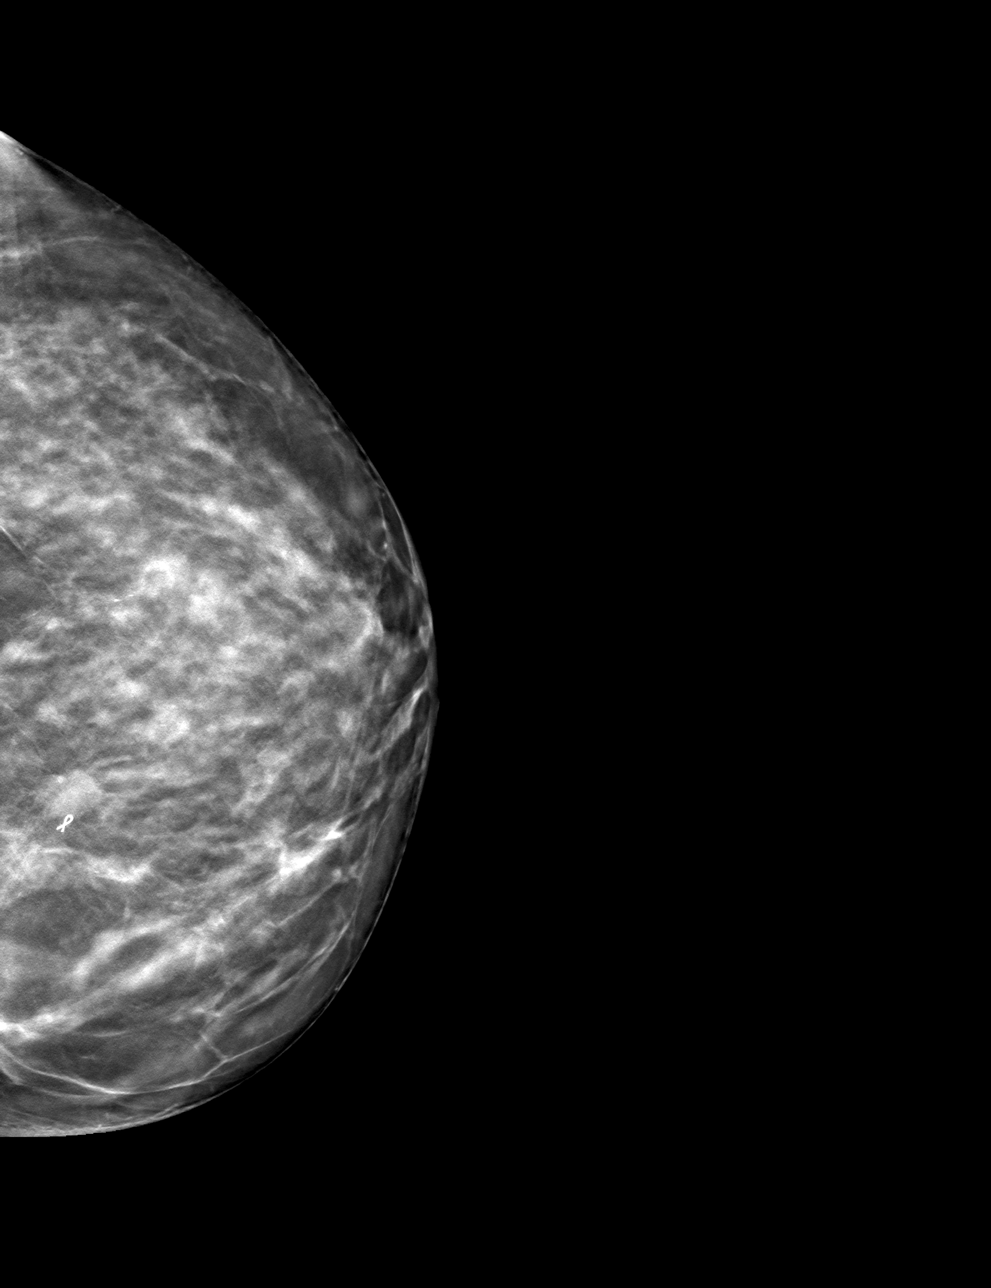

[L ML tomo · tomo slice 37/74.0]
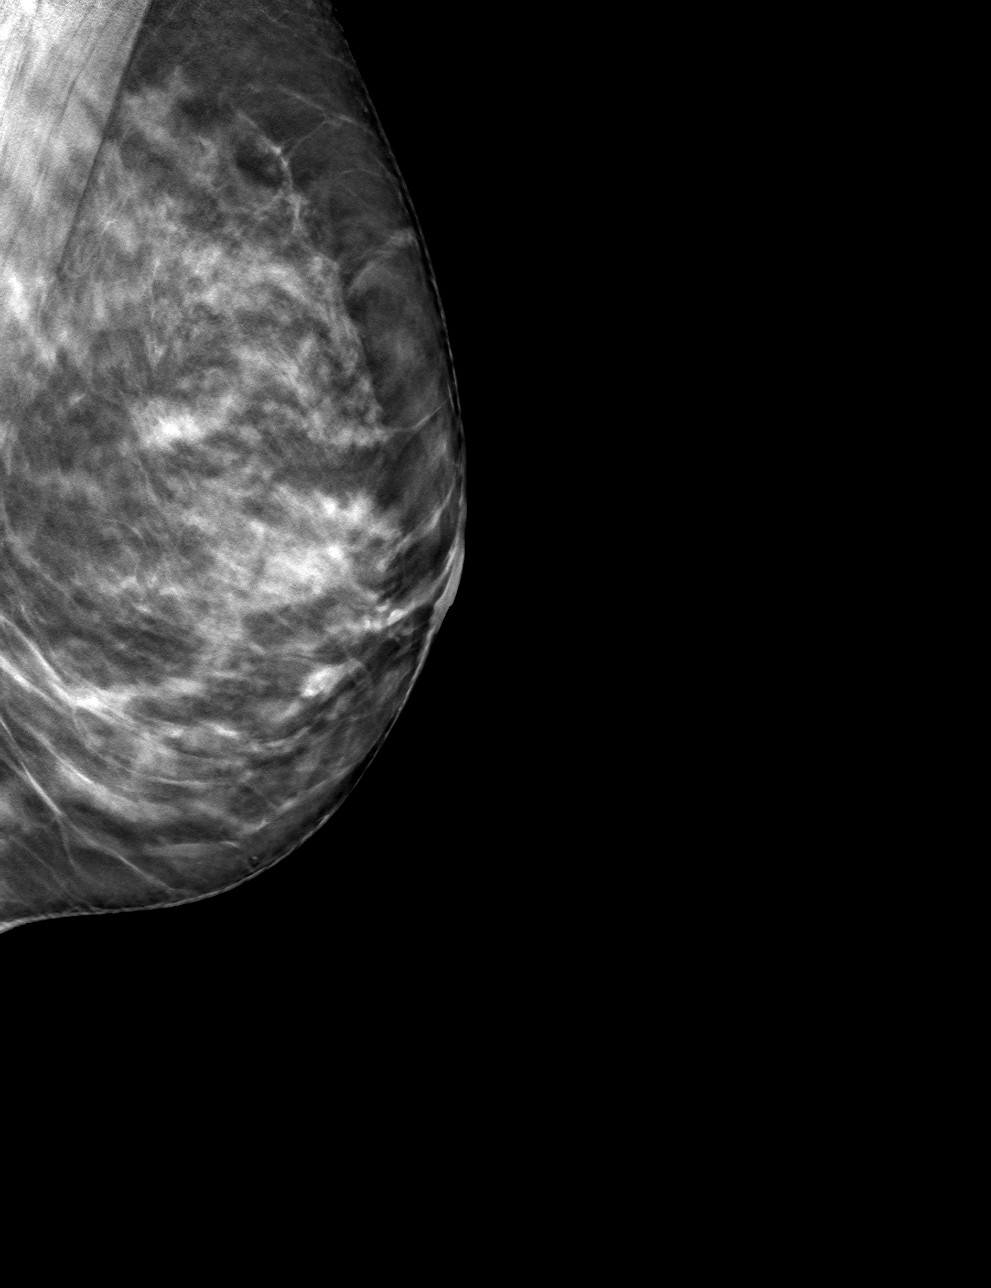

[4 of 12 positions shown; findings below may reference images not displayed]

FINDINGS: Mammographic images were obtained following ultrasound guided biopsy
of a mass in the left breast at the 10 o'clock position and
ultrasound-guided biopsy of a mass in the left breast at the [DATE]
position. A ribbon shaped biopsy marking clip is present at the site
of the biopsied mass in the left breast at the 10 o'clock position.
A coil shaped biopsy marking clip is present at the site of the
biopsied mass in the left breast at the [DATE] position.
IMPRESSION: 1. Ribbon shaped biopsy marking clip at site of biopsied mass in the
left breast at the 10 o'clock position.

2. Coil shaped biopsy marking clip at site of biopsied mass in the
left breast at the [DATE] position.

Final Assessment: Post Procedure Mammograms for Marker Placement

## 2021-01-17 ENCOUNTER — Inpatient Hospital Stay: Payer: BC Managed Care – PPO | Attending: Hematology | Admitting: Hematology

## 2021-01-17 ENCOUNTER — Other Ambulatory Visit: Payer: Self-pay

## 2021-01-17 ENCOUNTER — Inpatient Hospital Stay: Payer: BC Managed Care – PPO

## 2021-01-17 VITALS — BP 137/85 | HR 85 | Temp 98.1°F | Resp 16 | Ht 62.0 in | Wt 145.0 lb

## 2021-01-17 DIAGNOSIS — Z170 Estrogen receptor positive status [ER+]: Secondary | ICD-10-CM | POA: Diagnosis not present

## 2021-01-17 DIAGNOSIS — Z923 Personal history of irradiation: Secondary | ICD-10-CM | POA: Diagnosis not present

## 2021-01-17 DIAGNOSIS — Z9013 Acquired absence of bilateral breasts and nipples: Secondary | ICD-10-CM | POA: Insufficient documentation

## 2021-01-17 DIAGNOSIS — C50212 Malignant neoplasm of upper-inner quadrant of left female breast: Secondary | ICD-10-CM

## 2021-01-17 DIAGNOSIS — Z95828 Presence of other vascular implants and grafts: Secondary | ICD-10-CM

## 2021-01-17 DIAGNOSIS — Z9221 Personal history of antineoplastic chemotherapy: Secondary | ICD-10-CM | POA: Insufficient documentation

## 2021-01-17 DIAGNOSIS — Z79810 Long term (current) use of selective estrogen receptor modulators (SERMs): Secondary | ICD-10-CM | POA: Diagnosis not present

## 2021-01-17 DIAGNOSIS — Z79811 Long term (current) use of aromatase inhibitors: Secondary | ICD-10-CM | POA: Insufficient documentation

## 2021-01-17 DIAGNOSIS — D0511 Intraductal carcinoma in situ of right breast: Secondary | ICD-10-CM | POA: Insufficient documentation

## 2021-01-17 MED ORDER — GOSERELIN ACETATE 3.6 MG ~~LOC~~ IMPL
3.6000 mg | DRUG_IMPLANT | Freq: Once | SUBCUTANEOUS | Status: AC
  Administered 2021-01-17: 3.6 mg via SUBCUTANEOUS

## 2021-01-17 MED ORDER — GOSERELIN ACETATE 3.6 MG ~~LOC~~ IMPL
DRUG_IMPLANT | SUBCUTANEOUS | Status: AC
  Filled 2021-01-17: qty 3.6

## 2021-01-17 NOTE — Progress Notes (Signed)
[facility]   [phone] Fax:([phone]) [phone]   Clinic Follow up Note   Patient Care Team: Wenda Low, MD as PCP - General (Internal Medicine) Rockwell Germany, RN as Oncology Nurse Navigator Mauro Kaufmann, RN as Oncology Nurse Navigator Erroll Luna, MD as Consulting Physician (General Surgery) Truitt Merle, MD as Consulting Physician (Hematology) Gery Pray, MD as Consulting Physician (Radiation Oncology) Alla Feeling, NP as Nurse Practitioner (Nurse Practitioner)  Date of Service:  01/17/2021  CHIEF COMPLAINT: f/u of left breast cancer  SUMMARY OF ONCOLOGIC HISTORY: Oncology History Overview Note  Cancer Staging Malignant neoplasm of upper-inner quadrant of left breast in female, estrogen receptor positive ([place]) Staging form: Breast, AJCC 8th Edition - Clinical stage from 03/22/2019: Stage IA (cT1c, cN0, cM0, G3, ER+, PR+, HER2+) - Signed by Truitt Merle, MD on 03/22/2019 Stage prefix: Initial diagnosis Histologic grading system: 3 grade system - Pathologic stage from 04/04/2019: Stage IA (pT1c, pN0, cM0, G3, ER+, PR+, HER2+) - Signed by Truitt Merle, MD on 04/21/2019 Stage prefix: Initial diagnosis Multigene prognostic tests performed: None Histologic grading system: 3 grade system Residual tumor (R): R0 - None - Pathologic: No stage assigned - Unsigned     Malignant neoplasm of upper-inner quadrant of left breast in female, estrogen receptor positive ([place])  03/10/2019 Mammogram   Diagnostic Mammogram 03/10/19  IMPRESSION: 1. Suspicious mass in the 10 o'clock position of the left breast, 3cm from the nipple measuring 12x7x39m. Biopsy is indicated. 2. Indeterminate mass in the 5:30 o'clock position of the left breast, 5cm from the nipple, measuring 9x6x776m Although this may reflect a fibroadenoma, tissue sampling is recommended.   03/16/2019 Initial Biopsy   Diagnosis 03/16/19  1. Breast, left, needle core biopsy, 10 o'clock - INVASIVE DUCTAL  CARCINOMA, GRADE 3. SEE NOTE 2. Breast, left, needle core biopsy, 5:30 o'clock - FIBROADENOMA   03/16/2019 Receptors her2   Results: IMMUNOHISTOCHEMICAL AND MORPHOMETRIC ANALYSIS PERFORMED MANUALLY The tumor cells are POSITIVE for Her2 (3+). Estrogen Receptor: 100%, POSITIVE, STRONG STAINING INTENSITY Progesterone Receptor: 100%, POSITIVE, STRONG STAINING INTENSITY Proliferation Marker Ki67: 60%   03/21/2019 Initial Diagnosis   Malignant neoplasm of upper-inner quadrant of left breast in female, estrogen receptor positive ([facility]   03/22/2019 Cancer Staging   Staging form: Breast, AJCC 8th Edition - Clinical stage from 03/22/2019: Stage IA (cT1c, cN0, cM0, G3, ER+, PR+, HER2+) - Signed by FeTruitt MerleMD on 03/22/2019    03/31/2019 Genetic Testing   VUS in CTNNA1 called [id number] (Intronic) was identified on the [employer] Common Hereditary Cancers Panel. The Common Hereditary Cancers Panel offered by [facility] includes sequencing and/or deletion duplication testing of the following 47 genes: APC, ATM, AXIN2, BARD1, BMPR1A, BRCA1, BRCA2, BRIP1, CDH1, CDKN2A (p14ARF), CDKN2A (p16INK4a), CKD4, CHEK2, CTNNA1, DICER1, EPCAM (Deletion/duplication testing only), GREM1 (promoter region deletion/duplication testing only), KIT, MEN1, MLH1, MSH2, MSH3, MSH6, MUTYH, NBN, NF1, NHTL1, PALB2, PDGFRA, PMS2, POLD1, POLE, PTEN, RAD50, RAD51C, RAD51D, SDHB, SDHC, SDHD, SMAD4, SMARCA4. STK11, TP53, TSC1, TSC2, and VHL.  The following genes were evaluated for sequence changes only: SDHA and HOXB13 c.251G>A variant only. The report date is 03/30/2019.    03/31/2019 Breast MRI   MRI breast 03/31/19  IMPRESSION: 1. 5 millimeter enhancing oval mass in the anteromedial aspect of the RIGHT breast warranting further evaluation. Given its anterior depth and visibility on noncontrast images, ultrasound is recommended to determine if there is a sonographic correlate. If no sonographic correlate is identified to guide biopsy, MRI  biopsy of  the RIGHT breast is recommended. 2. Known malignancy in the UPPER INNER QUADRANT of the LEFT breast measuring 1.0 centimeters. 3. 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   04/04/2019 Surgery   RADIOCATIVE SEED GUIDED LEFT BREAST LUMPECTOMY, LEFT AXILLARY SENTINEL LYMPH NODE BIOPSY and PAC placement  By Dr. Brantley Stage  04/04/19    04/04/2019 Pathology Results   DIAGNOSIS: 04/04/19  A. BREAST, LEFT, LUMPECTOMY:  -  Invasive ductal carcinoma, Nottingham grade 3 of 3, 1.3 cm  -  Ductal carcinoma in-situ, intermediate grade  -  Margins uninvolved by carcinoma (0.1 cm; posterior margin; see  comment)  -  Previous biopsy site changes present  -  See oncology table and comment below   B. BREAST, LEFT, ADDITIONAL MEDIAL MARGIN, EXCISION:  -  Fibrocystic changes  -  No residual carcinoma identified   C. LYMPH NODE, LEFT AXILLARY, SENTINEL, BIOPSY:  -   No carcinoma identified in one lymph node (0/1)    04/04/2019 Cancer Staging   Staging form: Breast, AJCC 8th Edition - Pathologic stage from 04/04/2019: Stage IA (pT1c, pN0, cM0, G3, ER+, PR+, HER2+) - Signed by Truitt Merle, MD on 04/21/2019    04/11/2019 Pathology Results   Diagnosis Breast, right, needle core biopsy, 2 o'clock - SMALL INTRADUCTAL PAPILLOMA. SEE NOTE - SMALL FIBROADENOMA - NEGATIVE FOR CARCINOMA   05/12/2019 - 06/10/2020 Chemotherapy   Adjuvant Weekly Taxol and Herceptin for 12 weeks starting 05/12/19, switched to Abraxane on 08/04/19 due to skin rash. Completed on 08/11/19.  Followed by maintenance Herceptin q3weeks to complete 1 year treatment (from 05/12/19) starting 09/01/19-06/10/20   08/29/2019 - 09/26/2019 Radiation Therapy   Adjuvant radiation per Dr. Sondra Come    10/12/2019 -  Anti-estrogen oral therapy   Tamoxifen 40m daily starting 10/12/19   11/27/2019 Survivorship   SCP delivered by LCira Rue NP    03/15/2020 Mammogram   IMPRESSION: 1. Expected surgical changes at the  lumpectomy site in the central posterior left breast.   2. No suspicious changes at the site of the biopsied papilloma in the lower inner retroareolar right breast (ribbon clip). Surgical excision is planned for this lesion.   3.  No evidence of malignancy in the bilateral breasts.   04/17/2020 Surgery   RIGHT BREAST LUMPECTOMY WITH RADIOACTIVE SEED LOCALIZATION and PAC removal by Dr CBrantley Stage  FINAL MICROSCOPIC DIAGNOSIS:   A. BREAST, RIGHT, LUMPECTOMY:  - Biopsy clip.  Fibroadenoma.  Fibroadenomatoid change.  Fibrocystic  change.        CURRENT THERAPY:  Zometa every 6 months starting 11/27/19 Tamoxifen 227mdaily starting 10/12/19. Switched to Anastrozole in 09/2020 Zoladex injections 08/26/20 - 01/17/21  INTERVAL HISTORY:  Chelsea Hobbs is here for a follow up of breast cancer. She was last seen by me in the office on 09/23/20, and we had a phone visit on 11/04/20 in the interim. She presents to the clinic alone. She has bruising to her left chest wall from surgery. She underwent latissimus flap reconstruction. She notes she appreciates Dr. ThIran Planas She notes she has gained weight since her surgery due to having to be inactive. She notes joint pain from anastrozole.   All other systems were reviewed with the patient and are negative.  MEDICAL HISTORY:  Past Medical History:  Diagnosis Date   Asthma    [facility]   Left breast Stage I Ductal carcinoma   Cancer ([facility]/22[facility]20[facility]   Right breast Stage 0 DCIS  Colitis    Family history of breast cancer    Family history of lung cancer    Personal history of chemotherapy    Personal history of radiation therapy    Proctitis     SURGICAL HISTORY: Past Surgical History:  Procedure Laterality Date   BREAST BIOPSY Right    BREAST LUMPECTOMY Left 04/04/2019   BREAST LUMPECTOMY WITH RADIOACTIVE SEED AND SENTINEL LYMPH NODE BIOPSY Left 04/04/2019   Procedure: RADIOCATIVE SEED GUIDED LEFT BREAST LUMPECTOMY, LEFT  AXILLARY SENTINEL LYMPH NODE BIOPSY;  Surgeon: Erroll Luna, MD;  Location: [facility];  Service: General;  Laterality: Left;   BREAST LUMPECTOMY WITH RADIOACTIVE SEED LOCALIZATION Right 04/17/2020   Procedure: RIGHT BREAST LUMPECTOMY WITH RADIOACTIVE SEED LOCALIZATION;  Surgeon: Erroll Luna, MD;  Location: [facility];  Service: General;  Laterality: Right;   BREAST RECONSTRUCTION WITH PLACEMENT OF TISSUE EXPANDER AND ALLODERM Right 12/26/2020   Procedure: RIGHT BREAST RECONSTRUCTION WITH PLACEMENT OF TISSUE EXPANDER AND ALLODERM;  Surgeon: Irene Limbo, MD;  Location: [place] [facility];  Service: Plastics;  Laterality: Right;   COLONOSCOPY     LATISSIMUS FLAP TO BREAST Left 12/26/2020   Procedure: LEFT BREAST RECONSTRUCTION WITH LATISSIMUS FLAP AND PLACEMENT OF TISSUE EXPANDER;  Surgeon: Irene Limbo, MD;  Location: [facility];  Service: Plastics;  Laterality: Left;   NASAL SINUS SURGERY Bilateral    NIPPLE SPARING MASTECTOMY Bilateral 12/26/2020   Procedure: BILATERAL SKIN SPARING MASTECTOMIES;  Surgeon: Erroll Luna, MD;  Location: [facility];  Service: General;  Laterality: Bilateral;   PORT-A-CATH REMOVAL Right 04/17/2020   Procedure: REMOVAL PORT-A-CATH;  Surgeon: Erroll Luna, MD;  Location: [facility];  Service: General;  Laterality: Right;   PORTACATH PLACEMENT N/A 04/04/2019   Procedure: INSERTION PORT-A-CATH WITH ULTRASOUND;  Surgeon: Erroll Luna, MD;  Location: [facility];  Service: General;  Laterality: N/A;   SENTINEL NODE BIOPSY Right 12/26/2020   Procedure: RIGHT AXILLARY SENTINEL LYMPH NODE BIOPSY;  Surgeon: Erroll Luna, MD;  Location: [facility];  Service: General;  Laterality: Right;   WISDOM TOOTH EXTRACTION Bilateral     I have reviewed the social history and family history with the patient and they are unchanged from previous note.  ALLERGIES:  is allergic to gadolinium derivatives, latex, tetracycline hcl, and  wound dressing adhesive.  MEDICATIONS:  Current Outpatient Medications  Medication Sig Dispense Refill   albuterol (PROVENTIL HFA;VENTOLIN HFA) 108 (90 BASE) MCG/ACT inhaler Inhale 2 puffs into the lungs every 6 (six) hours as needed for shortness of breath.     ALPRAZolam (XANAX) 0.5 MG tablet Take 0.25 mg by mouth 2 (two) times daily as needed for anxiety.     anastrozole (ARIMIDEX) 1 MG tablet TAKE 1 TABLET BY MOUTH EVERY DAY 90 tablet 1   Ascorbic Acid (VITAMIN C) 1000 MG tablet Take 2,000 mg by mouth daily. Lysine     Calcium-Magnesium-Vitamin D (CALCIUM MAGNESIUM PO) Take 1,200 mg by mouth daily.     Cholecalciferol (VITAMIN D) 125 MCG (5000 UT) CAPS Take 5,000 Units by mouth daily.     fexofenadine (ALLEGRA) 180 MG tablet Take 180 mg by mouth daily as needed for allergies.     fluticasone (FLONASE) 50 MCG/ACT nasal spray Place 1 spray into both nostrils daily as needed for allergies.     gabapentin (NEURONTIN) 100 MG capsule Take 2 capsules (200 mg total) by mouth at bedtime. 60 capsule 6   mesalamine (LIALDA) 1.2 g EC tablet Take 1.2 g by  mouth at bedtime.     methocarbamol (ROBAXIN) 500 MG tablet Take 500 mg by mouth 3 (three) times daily as needed.     montelukast (SINGULAIR) 10 MG tablet Take 1 tablet by mouth daily.     Multiple Vitamins-Minerals (MULTIVITAMIN WITH MINERALS) tablet Take 1 tablet by mouth daily.     Omega-3 1000 MG CAPS Take 1,000 mg by mouth daily.     oxyCODONE (OXY IR/ROXICODONE) 5 MG immediate release tablet Take 5 mg by mouth every 4 (four) hours as needed.     sulfamethoxazole-trimethoprim (BACTRIM DS) 800-160 MG tablet Take 1 tablet by mouth 2 (two) times daily.     SUMAtriptan (IMITREX) 100 MG tablet Take 100 mg by mouth every 2 (two) hours as needed for migraine.     venlafaxine XR (EFFEXOR-XR) 37.5 MG 24 hr capsule TAKE 1 CAPSULE BY MOUTH DAILY WITH BREAKFAST. (Patient taking differently: Take 37.5 mg by mouth daily with breakfast.) 90 capsule 1   Zinc 25  MG TABS Take 25 mg by mouth daily.     No current facility-administered medications for this visit.    PHYSICAL EXAMINATION: ECOG PERFORMANCE STATUS: 0 - Asymptomatic  Vitals:   01/17/21 1022  BP: 137/85  Pulse: 85  Resp: 16  Temp: 98.1 F (36.7 C)  SpO2: 99%   Filed Weights   01/17/21 1022  Weight: 145 lb (65.8 kg)    GENERAL:alert, no distress and comfortable SKIN: skin color, texture, turgor are normal, no rashes or significant lesions EYES: normal, Conjunctiva are pink and non-injected, sclera clear  NECK: supple, thyroid normal size, non-tender, without nodularity LYMPH:  no palpable lymphadenopathy in the cervical, axillary  NEURO: alert & oriented x 3 with fluent speech, no focal motor/sensory deficits BREAST: mastectomy incisions healing well, bruising present over left chest wall. No palpable mass, nodules or adenopathy bilaterally.   LABORATORY DATA:  I have reviewed the data as listed CBC Latest Ref Rng & Units 12/20/2020 11/18/2020 09/23/2020  WBC 4.0 - 10.5 K/uL 6.9 5.0 7.3  Hemoglobin 12.0 - 15.0 g/dL 13.1 13.1 13.3  Hematocrit 36.0 - 46.0 % 39.5 38.1 39.4  Platelets 150 - 400 K/uL 346 268 283     CMP Latest Ref Rng & Units 12/20/2020 11/18/2020 09/23/2020  Glucose 70 - 99 mg/dL 90 84 103(H)  BUN 6 - 20 mg/dL '12 13 15  ' Creatinine 0.44 - 1.00 mg/dL 0.57 0.67 0.77  Sodium 135 - 145 mmol/L 139 145 141  Potassium 3.5 - 5.1 mmol/L 3.9 4.1 4.4  Chloride 98 - 111 mmol/L 105 108 105  CO2 22 - 32 mmol/L '27 27 28  ' Calcium 8.9 - 10.3 mg/dL 9.6 8.7(L) 8.9  Total Protein 6.5 - 8.1 g/dL 7.2 6.7 7.1  Total Bilirubin 0.3 - 1.2 mg/dL 0.8 0.2(L) 0.4  Alkaline Phos 38 - 126 U/L 52 46 31(L)  AST 15 - 41 U/L 34 28 27  ALT 0 - 44 U/L 50(H) 27 25      RADIOGRAPHIC STUDIES: I have personally reviewed the radiological images as listed and agreed with the findings in the report. No results found.   ASSESSMENT & PLAN:  ZANITA MILLMAN is a 50 y.o. female with   1. Right  breast DCIS, ER/PER+ -She was found to have a 2 x 3 cm area of non-mass-like enhancement in the lower right breast by screening MRI 10/18/20 -Biopsy on 10/31/20 showed low-grade DCIS and atypical lobular hyperplasia. Insufficient material for ER/PR testing. -Given her prior breast  cancer and breast surgeries, she opted to proceed with bilateral mastectomies on 12/26/20 under Dr. Brantley Stage. Tissue expanders were also placed at that time by Dr. Iran Planas. Pathology showed: focal DCIS, low to intermediate grade, with calcifications; lobular neoplasia. ER and PR both strongly positive. Left breast and right axillary lymph nodes were negative. I reviewed with pt  -She is on anastrozole, which she tolerates well with mild joint pains. The plan is to continue this for 5-10 years. She is in agreement. I did discuss that we can switch to exemestane if her joint pain worsens. -We reviewed surveillance. She does not need mammograms. If she notices any change, we can do ultrasound. We will plan for bone density screening in a few years and monitor on AI. -f/u in 3 months   2. Malignant neoplasm of upper-inner quadrant of left breast, Stage IA, p(T1cN0M0), ER+/PR+, HER2+, Grade III -She was diagnosed in 03/2019. She underwent left breast lumpectomy and SLNB on 04/04/19. Her DCIS margins were close (60m).  -She completed adjuvant Taxol/Herceptin including maintenance Herceptin and adjuvant Radiation. -Given her ER/PR positive disease and premenopausal status, I started her on Tamoxifen in 10/2019. Tolerating well with some anxiety and hot flashes. -She underwent right  Breast lumpectomy to remove her benign fibroadenoma on 04/17/20.  -I have switched her tamoxifen to Zoladex injection and and anastrozole, she is tolerating well -She has BSO scheduled for 02/11/21. Today will be her final Zoladex.     PLAN:  -proceed with final Zoladex injection today -continue anastrozole -labs and f/u in 3 months    No  problem-specific Assessment & Plan notes found for this encounter.   No orders of the defined types were placed in this encounter.  All questions were answered. The patient knows to call the clinic with any problems, questions or concerns. No barriers to learning was detected. The total time spent in the appointment was 30 minutes.     YTruitt Merle MD 01/18/2021   I, KWilburn Mylar am acting as scribe for YTruitt Merle MD.   I have reviewed the above documentation for accuracy and completeness, and I agree with the above.

## 2021-01-18 ENCOUNTER — Encounter: Payer: Self-pay | Admitting: Hematology

## 2021-01-21 ENCOUNTER — Telehealth: Payer: Self-pay | Admitting: Hematology

## 2021-01-21 NOTE — Telephone Encounter (Signed)
Scheduled follow-up appointment per 7/8 los. Patient is aware. 

## 2021-01-22 ENCOUNTER — Other Ambulatory Visit: Payer: Self-pay | Admitting: Hematology

## 2021-01-24 ENCOUNTER — Encounter: Payer: Self-pay | Admitting: Hematology

## 2021-01-28 ENCOUNTER — Other Ambulatory Visit: Payer: Self-pay

## 2021-01-28 ENCOUNTER — Encounter: Payer: Self-pay | Admitting: Hematology

## 2021-01-29 ENCOUNTER — Encounter: Payer: Self-pay | Admitting: Physical Therapy

## 2021-01-29 ENCOUNTER — Ambulatory Visit: Payer: BC Managed Care – PPO | Attending: Plastic Surgery | Admitting: Physical Therapy

## 2021-01-29 ENCOUNTER — Other Ambulatory Visit: Payer: Self-pay

## 2021-01-29 DIAGNOSIS — R208 Other disturbances of skin sensation: Secondary | ICD-10-CM | POA: Diagnosis not present

## 2021-01-29 DIAGNOSIS — M25612 Stiffness of left shoulder, not elsewhere classified: Secondary | ICD-10-CM | POA: Diagnosis not present

## 2021-01-29 DIAGNOSIS — R293 Abnormal posture: Secondary | ICD-10-CM | POA: Insufficient documentation

## 2021-01-29 DIAGNOSIS — Z483 Aftercare following surgery for neoplasm: Secondary | ICD-10-CM | POA: Insufficient documentation

## 2021-01-29 DIAGNOSIS — C50212 Malignant neoplasm of upper-inner quadrant of left female breast: Secondary | ICD-10-CM | POA: Diagnosis not present

## 2021-01-29 DIAGNOSIS — M6281 Muscle weakness (generalized): Secondary | ICD-10-CM

## 2021-01-29 DIAGNOSIS — Z170 Estrogen receptor positive status [ER+]: Secondary | ICD-10-CM | POA: Diagnosis not present

## 2021-01-29 NOTE — Therapy (Signed)
[place], [place], [address] Phone: [phone]   Fax:  [phone]  Physical Therapy Evaluation  Patient Details  Name: Chelsea Hobbs MRN: [id number] Date of Birth: Nov 12, 1970 Referring Provider (PT): Dr. Iran Planas   Encounter Date: 01/29/2021   PT End of Session - 01/29/21 2006     Visit Number 1    Number of Visits 17    Date for PT Re-Evaluation 04/01/21    PT Start Time 1500    PT Stop Time 1550    PT Time Calculation (min) 50 min    Activity Tolerance Patient tolerated treatment well    Behavior During Therapy [facility] for tasks assessed/performed             Past Medical History:  Diagnosis Date   Asthma    Cancer ([place]) 03/17/2019   Left breast Stage I Ductal carcinoma   Cancer ([place]) 11/01/2020   Right breast Stage 0 DCIS   Colitis    Family history of breast cancer    Family history of lung cancer    Personal history of chemotherapy    Personal history of radiation therapy    Proctitis     Past Surgical History:  Procedure Laterality Date   BREAST BIOPSY Right    BREAST LUMPECTOMY Left 04/04/2019   BREAST LUMPECTOMY WITH RADIOACTIVE SEED AND SENTINEL LYMPH NODE BIOPSY Left 04/04/2019   Procedure: RADIOCATIVE SEED GUIDED LEFT BREAST LUMPECTOMY, LEFT AXILLARY SENTINEL LYMPH NODE BIOPSY;  Surgeon: Erroll Luna, MD;  Location: [place];  Service: General;  Laterality: Left;   BREAST LUMPECTOMY WITH RADIOACTIVE SEED LOCALIZATION Right 04/17/2020   Procedure: RIGHT BREAST LUMPECTOMY WITH RADIOACTIVE SEED LOCALIZATION;  Surgeon: Erroll Luna, MD;  Location: [place];  Service: General;  Laterality: Right;   BREAST RECONSTRUCTION WITH PLACEMENT OF TISSUE EXPANDER AND ALLODERM Right 12/26/2020   Procedure: RIGHT BREAST RECONSTRUCTION WITH PLACEMENT OF TISSUE EXPANDER AND ALLODERM;  Surgeon: Irene Limbo, MD;  Location: [place];  Service: Plastics;   Laterality: Right;   COLONOSCOPY     LATISSIMUS FLAP TO BREAST Left 12/26/2020   Procedure: LEFT BREAST RECONSTRUCTION WITH LATISSIMUS FLAP AND PLACEMENT OF TISSUE EXPANDER;  Surgeon: Irene Limbo, MD;  Location: [place];  Service: Plastics;  Laterality: Left;   NASAL SINUS SURGERY Bilateral    NIPPLE SPARING MASTECTOMY Bilateral 12/26/2020   Procedure: BILATERAL SKIN SPARING MASTECTOMIES;  Surgeon: Erroll Luna, MD;  Location: [place];  Service: General;  Laterality: Bilateral;   PORT-A-CATH REMOVAL Right 04/17/2020   Procedure: REMOVAL PORT-A-CATH;  Surgeon: Erroll Luna, MD;  Location: [place];  Service: General;  Laterality: Right;   PORTACATH PLACEMENT N/A 04/04/2019   Procedure: INSERTION PORT-A-CATH WITH ULTRASOUND;  Surgeon: Erroll Luna, MD;  Location: [place];  Service: General;  Laterality: N/A;   SENTINEL NODE BIOPSY Right 12/26/2020   Procedure: RIGHT AXILLARY SENTINEL LYMPH NODE BIOPSY;  Surgeon: Erroll Luna, MD;  Location: [place];  Service: General;  Laterality: Right;   WISDOM TOOTH EXTRACTION Bilateral     There were no vitals filed for this visit.    Subjective Assessment - 01/29/21 1510     Subjective Pt states her expanders are very uncomfortable.  She feels more of a pull on the left side with pressure at anterior skin  "I just feel tight"    Pertinent History DCIS right breast pt underwent bilateral mastectomy on 12/26/2020 with 0/3 nodes on right and  latt flap on left with reentry into previous axillary node incision, bilateral expanders in place.  Plans to have ovaries and fallopian tubes removed on 02/11/2021 and back to work on 02/19/2021.  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 Patient reported she underwent a left lumpectomy and sentinel node biopsy on 04/04/2019 with 1 negative  axillary node removed with a Ki67 of 60%. 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    Currently in Pain? No/denies                [facility] PT Assessment - 01/29/21 0001       Assessment   Medical Diagnosis left breast cancer    Referring Provider (PT) Dr. Thimmappa    Onset Date/Surgical Date 12/26/20   past history of left lumpectomy 04/04/2019   Hand Dominance Right    Prior Therapy none      Precautions   Precautions Other (comment)    Precaution Comments at risk for lymphedema      Restrictions   Weight Bearing Restrictions No      Balance Screen   Has the patient fallen in the past 6 months No    Has the patient had a decrease in activity level because of a fear of falling?  No    Is the patient reluctant to leave their home because of a fear of falling?  No      Home Environment   Living Environment Private residence    Living Arrangements Spouse/significant other;Children   Husband, 22 and 24y.o. kids   Available Help at Discharge Family    Type of Home House      Prior Function   Level of Independence Independent    Vocation Part time employment    Vocation Requirements dental hygienist    Leisure pt is walking 2 miles a day and tries to do arm stretching and some little weight lifting with 2#      Cognition   Overall Cognitive Status Within Functional Limits for tasks assessed      Observation/Other Assessments   Observations well healing incisions on right breast expander. Well healed incision on left back from lat flap and healing incision on left breast expander with small open area ( about 1 cm) at distal aspect of incision that appears shallow with min serous drainage on old dressing. Pt says it did have a dry scab, but opened up and started draining again. Dr. T is aware.      Posture/Postural Control   Posture/Postural Control Postural limitations    Postural Limitations Rounded  Shoulders;Forward head      AROM   Overall AROM  --    AROM Assessment Site Shoulder    Right/Left Shoulder Right;Left    Right Shoulder Flexion 155 Degrees    Right Shoulder ABduction 165 Degrees    Left Shoulder Flexion 140 Degrees    Left Shoulder ABduction 155 Degrees      Strength   Overall Strength Deficits    Overall Strength Comments pt states she feels generally weak since surgery. She is concerned as she will be reuturning to work 3 days a week, 8 hours a day as a dental hygenist.    Right Hand Grip (lbs) --    Left Hand Grip (lbs) --      Right Hip Flexion --    Right Hip Extension --    Right Hip ABduction --    Left Hip Flexion --    Left Hip Extension --    Left Hip ADduction --    Right Knee Flexion --    Right Knee Extension --    Left Knee Flexion --    Left Knee Extension --    Right Ankle Dorsiflexion --    Left Ankle Dorsiflexion --      Standardized Balance Assessment   Standardized Balance Assessment --   30 sec sit to stand- 17 reps, slightly below average     High Level Balance   High Level Balance Comments SLS: L side: 22 sec, R side: 30 sec               LYMPHEDEMA/ONCOLOGY QUESTIONNAIRE - 01/29/21 0001       Type   Cancer Type left and right breast cander      Surgeries   Mastectomy Date 12/26/20   bialtera   Lumpectomy Date 04/04/19   left   Number Lymph Nodes Removed --   1 on left, 3 on right     Treatment   Active Chemotherapy Treatment No    Past Chemotherapy Treatment Yes    Past Radiation Treatment Yes    Date 07/14/19   approx   Body Site left upper quadrant    Current Hormone Treatment Yes    Drug Name anastrazole      What other symptoms do you have   Are you Having Heaviness or Tightness Yes    Are you having Pain Yes    Are you having pitting edema No    Is it Hard or Difficult finding clothes that fit No    Do you have infections No    Is there Decreased scar mobility --   did not test due to recent surgery    [place]mer Sign No      Right Upper Extremity Lymphedema   10 cm Proximal to Olecranon Process 28 cm    Olecranon Process 24.5 cm    10 cm Proximal to Ulnar Styloid Process 20.2 cm    Just Proximal to Ulnar Styloid Process 14.2 cm    Across Hand at [employer] 17.6 cm    At [place] of 2nd Digit 5.7 cm      Left Upper Extremity Lymphedema   10 cm Proximal to Olecranon Process 27.1 cm    Olecranon Process 23.5 cm    10 cm Proximal to Ulnar Styloid Process 19.5 cm    Just Proximal to Ulnar Styloid Process 14.1 cm    Across Hand at [employer] 17.6 cm    At [place] of 2nd Digit 5.7 cm                     Objective measurements completed on examination: See above findings.       [facility] Adult PT Treatment/Exercise - 01/29/21 0001       Exercises   Exercises Knee/Hip;Ankle;Other Exercises    Other Exercises  recommended that pt continue to do all activites with UEs below the shoulder leve and avoid stretching left arm overhead until skin heal on left breast breast. encouraged her to keep walking and to do mini squats, mini dead lfts and heel raises 5 times each 3 times a day to build general strength.  PT Education - 01/29/21 2004     Education Details continue to use arm as much as possible below shoulder level until skin on left breast is healed, then can progress with stretching. continue walking and add partial range squats and deadlift for LE strength in preparation for return to work    Person(s) Educated Patient    Methods Explanation    Comprehension Verbalized understanding;Returned demonstration              PT Short Term Goals - 01/29/21 2020       PT SHORT TERM GOAL #1   Title Pt will be independent in a home exercise program for general strength in preparation for return to work    Time 4    Period Weeks    Status New               PT Long Term Goals - 01/29/21 2021       PT LONG TERM GOAL #1   Title Pt will  have 160 of right and left shoulder aduction without pain so that she can return to normal activities    Time 8    Period Weeks    Status New      PT LONG TERM GOAL #2   Title Pt will have 155 degress of right and left shoulder flexion without pain so that she can return to normal activities    Time 8    Period Weeks    Status New      PT LONG TERM GOAL #3   Title Pt will be independent in a home exercise program for ongoing fitness    Time 8    Period Weeks      PT LONG TERM GOAL #4   Title Pt will be set up for regular SOZO screens to monitor for the occurance of lymphedema    Time 8    Period Weeks    Status New                    Plan - 01/29/21 2006     Clinical Impression Statement Pt comes back to PT after double mastectomy with lat flap on the left and immediate expanders due to recurrance of breast cancer. She is having delayed healing on left side ( site of previous radiation) and still has some tightness in this area, but do not feel she should do aggressive stretching until the skin is healed. She is concerned about return to work one week after ovary and fallopian tube removal.  Began basic LE strength today and encouraged walking program. Recommend PT 3 times a week next week to get as many visits as possible before the surgey to prepare for return to work., then decrease to 1-2 times as able after she returns to work to regain shoulde range prior to implant surgery    Personal Factors and Comorbidities Comorbidity 3+    Comorbidities previous surgery and radiation on left side , lymph nodes removed from both sides, previous chemo    Examination-Activity Limitations Hygiene/Grooming;Bed Mobility;Dressing    Examination-Participation Restrictions Other    Stability/Clinical Decision Making Stable/Uncomplicated    Clinical Decision Making Low    Rehab Potential Good    PT Frequency 3x / week   then decreasing to 1-2 times a week when she returns to work   PT  Duration 8 weeks    PT Treatment/Interventions ADLs/Self Care Home Management;Therapeutic exercise;Patient/family education;Manual techniques;Taping;Electrical Stimulation;Neuromuscular re-education;Balance training;DME Instruction;Passive   range of motion    PT Next Visit Plan ROM with stretching to right shoulder, be careful with left shoulder until skin heals, progress core,LE strength, endurance training in preparation for return to work    [employer] and Agree with Plan of Care Patient             Patient will benefit from skilled therapeutic intervention in order to improve the following deficits and impairments:  Postural dysfunction, Pain, Decreased skin integrity, Increased muscle spasms, Impaired UE functional use, Increased fascial restricitons, Decreased strength, Decreased knowledge of use of DME, Decreased activity tolerance, Decreased scar mobility, Decreased range of motion, Decreased knowledge of precautions  Visit Diagnosis: Aftercare following surgery for neoplasm - Plan: PT plan of care cert/re-cert  Stiffness of left shoulder, not elsewhere classified - Plan: PT plan of care cert/re-cert  Abnormal posture - Plan: PT plan of care cert/re-cert  Other disturbances of skin sensation - Plan: PT plan of care cert/re-cert  Muscle weakness (generalized) - Plan: PT plan of care cert/re-cert     Problem List Patient Active Problem List   Diagnosis Date Noted   History of breast cancer 12/26/2020   Skin rash 04/29/2020   Port-A-Cath in place 06/02/2019   Intraductal papilloma of breast, right 04/21/2019   Genetic testing 03/31/2019   Family history of breast cancer    Family history of lung cancer    Malignant neoplasm of upper-inner quadrant of left breast in female, estrogen receptor positive ([place]) 03/21/2019   NASAL POLYP 09/22/2007   ALLERGIC RHINITIS 09/22/2007   ASTHMA 09/22/2007   PERSONAL HISTORY OF ALLERGY TO LATEX 09/22/2007   Donato Heinz. Owens Shark PT  Norwood Levo 01/29/2021, 8:26 PM  [place], [place], [address] Phone: [phone]   Fax:  [phone]  Name: LIVVY SPILMAN MRN: [id number] Date of Birth: 04/08/1971

## 2021-01-30 NOTE — H&P (Signed)
Chelsea Hobbs is an 50 y.o. female. Presenting for a risk reducing salpingo-oophorectomy for a history of breast cancer. She has a history of IBS and is otherwise uncomplicated.   Pertinent Gynecological History: Menses: flow is moderate Bleeding: normal Contraception: none DES exposure: denies Blood transfusions: none Sexually transmitted diseases: no past history Previous GYN Procedures:  none   Last mammogram:  known breast cancer Last pap: normal Date: 2020 OB History: G2, P2 - SVD x 2   Menstrual History: No LMP recorded. (Menstrual status: Other).    Past Medical History:  Diagnosis Date   Asthma    Cancer ([place]) 03/17/2019   Left breast Stage I Ductal carcinoma   Cancer ([place]) 11/01/2020   Right breast Stage 0 DCIS   Colitis    Family history of breast cancer    Family history of lung cancer    Personal history of chemotherapy    Personal history of radiation therapy    Proctitis     Past Surgical History:  Procedure Laterality Date   BREAST BIOPSY Right    BREAST LUMPECTOMY Left 04/04/2019   BREAST LUMPECTOMY WITH RADIOACTIVE SEED AND SENTINEL LYMPH NODE BIOPSY Left 04/04/2019   Procedure: RADIOCATIVE SEED GUIDED LEFT BREAST LUMPECTOMY, LEFT AXILLARY SENTINEL LYMPH NODE BIOPSY;  Surgeon: Erroll Luna, MD;  Location: [place];  Service: General;  Laterality: Left;   BREAST LUMPECTOMY WITH RADIOACTIVE SEED LOCALIZATION Right 04/17/2020   Procedure: RIGHT BREAST LUMPECTOMY WITH RADIOACTIVE SEED LOCALIZATION;  Surgeon: Erroll Luna, MD;  Location: [place];  Service: General;  Laterality: Right;   BREAST RECONSTRUCTION WITH PLACEMENT OF TISSUE EXPANDER AND ALLODERM Right 12/26/2020   Procedure: RIGHT BREAST RECONSTRUCTION WITH PLACEMENT OF TISSUE EXPANDER AND ALLODERM;  Surgeon: Irene Limbo, MD;  Location: [place];  Service: Plastics;  Laterality: Right;   COLONOSCOPY     LATISSIMUS FLAP TO BREAST Left 12/26/2020   Procedure:  LEFT BREAST RECONSTRUCTION WITH LATISSIMUS FLAP AND PLACEMENT OF TISSUE EXPANDER;  Surgeon: Irene Limbo, MD;  Location: [place];  Service: Plastics;  Laterality: Left;   NASAL SINUS SURGERY Bilateral    NIPPLE SPARING MASTECTOMY Bilateral 12/26/2020   Procedure: BILATERAL SKIN SPARING MASTECTOMIES;  Surgeon: Erroll Luna, MD;  Location: [place];  Service: General;  Laterality: Bilateral;   PORT-A-CATH REMOVAL Right 04/17/2020   Procedure: REMOVAL PORT-A-CATH;  Surgeon: Erroll Luna, MD;  Location: [place];  Service: General;  Laterality: Right;   PORTACATH PLACEMENT N/A 04/04/2019   Procedure: INSERTION PORT-A-CATH WITH ULTRASOUND;  Surgeon: Erroll Luna, MD;  Location: [place];  Service: General;  Laterality: N/A;   SENTINEL NODE BIOPSY Right 12/26/2020   Procedure: RIGHT AXILLARY SENTINEL LYMPH NODE BIOPSY;  Surgeon: Erroll Luna, MD;  Location: [place];  Service: General;  Laterality: Right;   WISDOM TOOTH EXTRACTION Bilateral     Family History  Problem Relation Age of Onset   Breast cancer Mother 15   Cancer Maternal Grandmother 55       lung cancer   Cancer Maternal Grandfather 56       lung cancer   Breast cancer Paternal Grandmother 65    Social History:  reports that she has never smoked. She has never used smokeless tobacco. She reports previous alcohol use. She reports that she does not use drugs.  Allergies:  Allergies  Allergen Reactions   Gadolinium Derivatives Hives and Other (See Comments)    Pt started wheezing after contrast/ runny nose/ 1 hive  on arm/ Dr Tery Sanfilippo checked pt condition/ pt has an inhaler so used her inhaler and felt better/ no medication given / pt was instructed to wait at [place] for 30 min to be reassessed by Dr.//jv    Latex Dermatitis   Tetracycline Hcl     Other reaction(s): diarrhea   Wound Dressing Adhesive Rash    Dermabond    No medications prior to admission.    Review of  Systems  There were no vitals taken for this visit. Physical Exam Gen: well appearing, NAD CV: Reg rate Pulm: NWOB Abd: soft, nondistended, nontender, no masses GYN: uterus 6 week size, no adnexa ttp/CMT Ext: No edema b/l   No results found for this or any previous visit (from the past 24 hour(s)).  No results found.  Assessment/Plan: 50 yo G2P2 (SVD x2) presenting for scheduled RR BSO. Oncologist recommended BSO. She declines a hysterectomy. All options were discussed for management. After careful discussing, weighing the risks and benefits, she elected to have a risk reducing LSC bilateral salpingo-ophorectomy. Risks were reviewed including infection, bleeding, damage to surrounding structures, need for additional procedures, conversion to an open procedure, pain s/s scarring, the possibility of cancer/unexpected abnormality on final pathology, the possibility of not fixing this issue, and thromboembolicdisease.  We discussed my plan for surgery which includes [place] bilateral salpingo-ophorectomy.  We reviewed typical postoperative course such as her hospital stay, pelvic rest, POV, and lifting restrictions.    [name] 01/30/2021, 3:31 PM

## 2021-02-03 IMAGING — DX MM BREAST SURGICAL SPECIMEN
1 series · 2 of 2 positions shown · non-contrast
Comparison: Previous exam(s).

CLINICAL DATA: Specimen radiograph status post left breast
lumpectomy.

EXAM:
SPECIMEN RADIOGRAPH OF THE LEFT BREAST

[Series 2: specimen digital x-ray, derived · left · 2 of 2 slices shown]
[im 1/2]
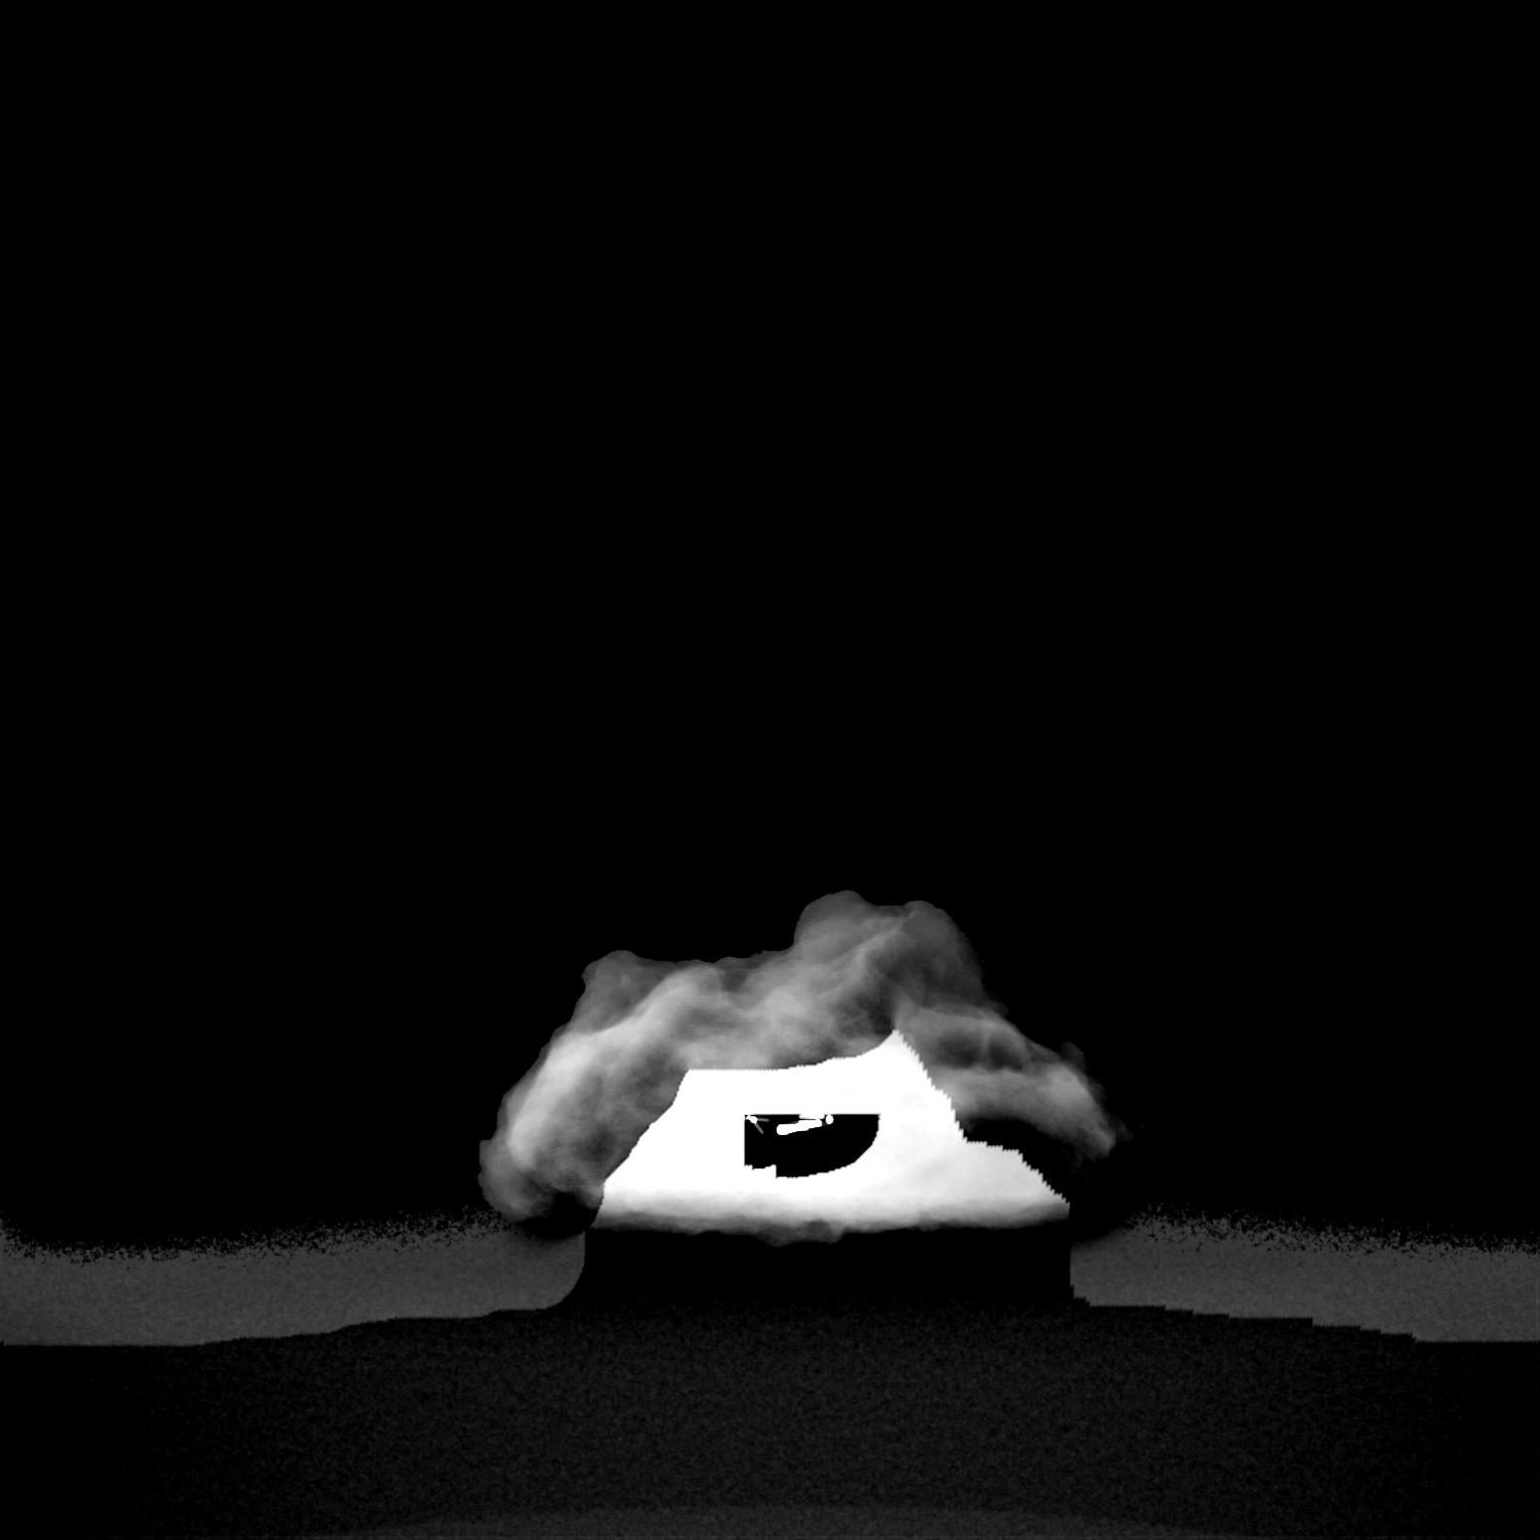
[im 2/2]
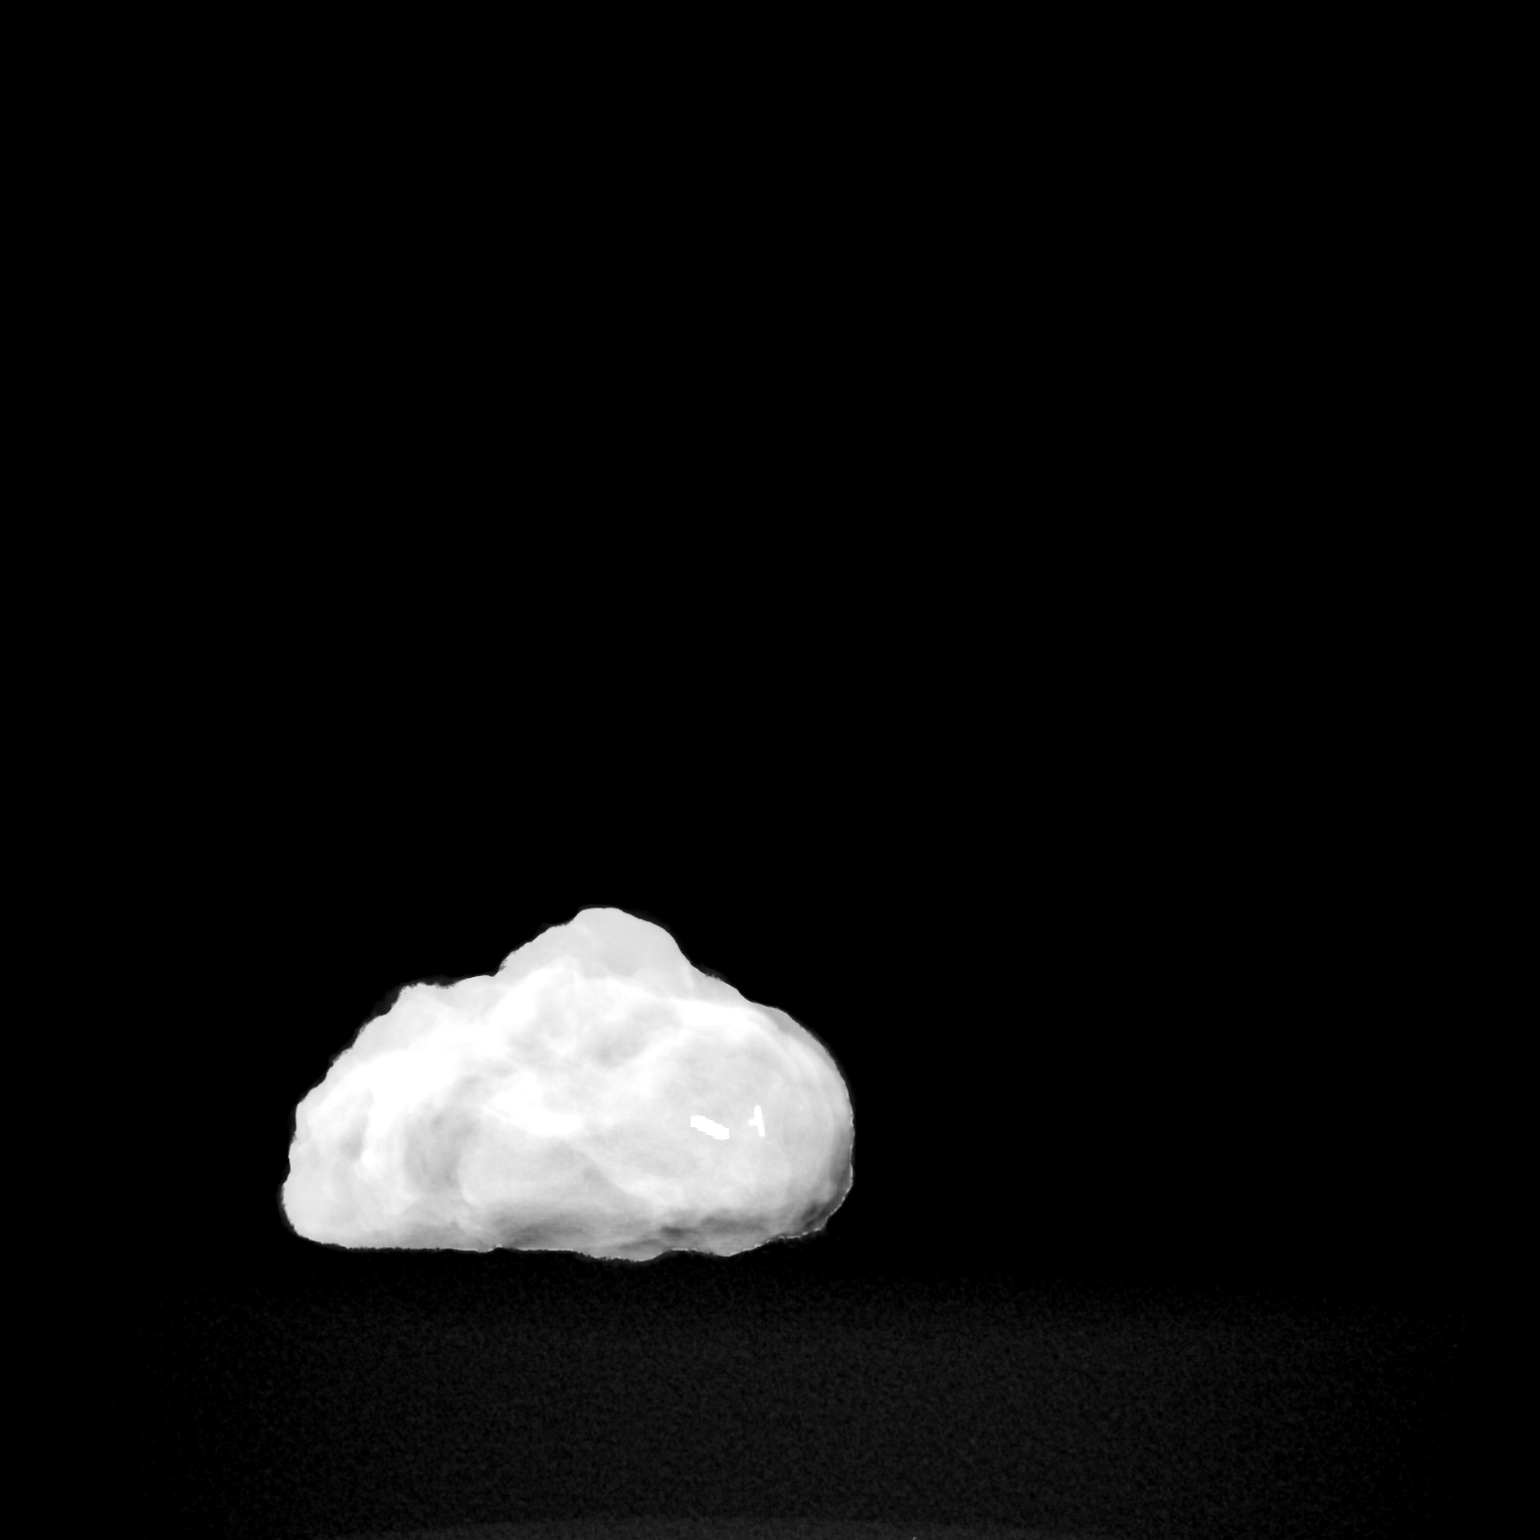

[2 of 2 positions shown; findings below may reference images not displayed]

FINDINGS: Status post excision of the left breast. The radioactive seed and
biopsy marker clip are present and completely intact. These findings
were communicated with the OR at [DATE] p.m.
IMPRESSION: Specimen radiograph of the left breast.

## 2021-02-04 ENCOUNTER — Other Ambulatory Visit: Payer: Self-pay

## 2021-02-04 ENCOUNTER — Ambulatory Visit: Payer: BC Managed Care – PPO | Admitting: Physical Therapy

## 2021-02-04 DIAGNOSIS — M6281 Muscle weakness (generalized): Secondary | ICD-10-CM

## 2021-02-04 DIAGNOSIS — C50212 Malignant neoplasm of upper-inner quadrant of left female breast: Secondary | ICD-10-CM | POA: Diagnosis not present

## 2021-02-04 DIAGNOSIS — R293 Abnormal posture: Secondary | ICD-10-CM

## 2021-02-04 DIAGNOSIS — Z170 Estrogen receptor positive status [ER+]: Secondary | ICD-10-CM

## 2021-02-04 DIAGNOSIS — R208 Other disturbances of skin sensation: Secondary | ICD-10-CM

## 2021-02-04 DIAGNOSIS — M25612 Stiffness of left shoulder, not elsewhere classified: Secondary | ICD-10-CM

## 2021-02-04 DIAGNOSIS — Z483 Aftercare following surgery for neoplasm: Secondary | ICD-10-CM

## 2021-02-04 NOTE — Therapy (Signed)
[place], [place], [address] Phone: [phone]   Fax:  [phone]  Physical Therapy Treatment  Patient Details  Name: Chelsea Hobbs MRN: [id number] Date of Birth: August 06, 1970 Referring Provider (PT): Dr. Iran Planas   Encounter Date: 02/04/2021   PT End of Session - 02/04/21 1811     Visit Number 2    Number of Visits 17    Date for PT Re-Evaluation 04/01/21    PT Start Time 1600    PT Stop Time 1645    PT Time Calculation (min) 45 min    Activity Tolerance Patient tolerated treatment well    Behavior During Therapy [facility] for tasks assessed/performed             Past Medical History:  Diagnosis Date   Asthma    Cancer ([place]) 03/17/2019   Left breast Stage I Ductal carcinoma   Cancer ([place]) 11/01/2020   Right breast Stage 0 DCIS   Colitis    Family history of breast cancer    Family history of lung cancer    Personal history of chemotherapy    Personal history of radiation therapy    Proctitis     Past Surgical History:  Procedure Laterality Date   BREAST BIOPSY Right    BREAST LUMPECTOMY Left 04/04/2019   BREAST LUMPECTOMY WITH RADIOACTIVE SEED AND SENTINEL LYMPH NODE BIOPSY Left 04/04/2019   Procedure: RADIOCATIVE SEED GUIDED LEFT BREAST LUMPECTOMY, LEFT AXILLARY SENTINEL LYMPH NODE BIOPSY;  Surgeon: Erroll Luna, MD;  Location: [place];  Service: General;  Laterality: Left;   BREAST LUMPECTOMY WITH RADIOACTIVE SEED LOCALIZATION Right 04/17/2020   Procedure: RIGHT BREAST LUMPECTOMY WITH RADIOACTIVE SEED LOCALIZATION;  Surgeon: Erroll Luna, MD;  Location: [facility];  Service: General;  Laterality: Right;   BREAST RECONSTRUCTION WITH PLACEMENT OF TISSUE EXPANDER AND ALLODERM Right 12/26/2020   Procedure: RIGHT BREAST RECONSTRUCTION WITH PLACEMENT OF TISSUE EXPANDER AND ALLODERM;  Surgeon: Irene Limbo, MD;  Location: [place];  Service: Plastics;   Laterality: Right;   COLONOSCOPY     LATISSIMUS FLAP TO BREAST Left 12/26/2020   Procedure: LEFT BREAST RECONSTRUCTION WITH LATISSIMUS FLAP AND PLACEMENT OF TISSUE EXPANDER;  Surgeon: Irene Limbo, MD;  Location: [facility];  Service: Plastics;  Laterality: Left;   NASAL SINUS SURGERY Bilateral    NIPPLE SPARING MASTECTOMY Bilateral 12/26/2020   Procedure: BILATERAL SKIN SPARING MASTECTOMIES;  Surgeon: Erroll Luna, MD;  Location: [facility];  Service: General;  Laterality: Bilateral;   PORT-A-CATH REMOVAL Right 04/17/2020   Procedure: REMOVAL PORT-A-CATH;  Surgeon: Erroll Luna, MD;  Location: [facility];  Service: General;  Laterality: Right;   PORTACATH PLACEMENT N/A 04/04/2019   Procedure: INSERTION PORT-A-CATH WITH ULTRASOUND;  Surgeon: Erroll Luna, MD;  Location: [facility];  Service: General;  Laterality: N/A;   SENTINEL NODE BIOPSY Right 12/26/2020   Procedure: RIGHT AXILLARY SENTINEL LYMPH NODE BIOPSY;  Surgeon: Erroll Luna, MD;  Location: [facility];  Service: General;  Laterality: Right;   WISDOM TOOTH EXTRACTION Bilateral     There were no vitals filed for this visit.   Subjective Assessment - 02/04/21 1609     Subjective Pt states she has been doing her exercises.  She says the open area on her left breast started to dry out some, but when she walked and sweated she notices  it opened up again    Pertinent History DCIS right breast pt underwent bilateral mastectomy on  12/26/2020 with 0/3 nodes on right and latt flap on left with reentry into previous axillary node incision, bilateral expanders in place.  Plans to have ovaries and fallopian tubes removed on 02/11/2021 and back to work on 02/19/2021.  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 Patient reported she underwent a left lumpectomy and sentinel node biopsy on  04/04/2019 with 1 negative axillary node removed with a Ki67 of 60%. Pt completed radiation and chemo    Currently in Pain? No/denies                               [facility] Adult PT Treatment/Exercise - 02/04/21 0001       Exercises   Exercises Knee/Hip;Shoulder;Lumbar      Lumbar Exercises: Stretches   Lower Trunk Rotation 5 reps      Lumbar Exercises: Supine   Dead Bug 10 reps    Bridge 10 reps      Lumbar Exercises: Sidelying   Clam 10 reps    Hip Abduction 10 reps      Knee/Hip Exercises: Stretches   Other Knee/Hip Stretches wide leg "windshield wiper" for hip rotation stretch      Shoulder Exercises: Supine   Protraction AROM;Both;10 reps    Horizontal ABduction AROM;5 reps    External Rotation AROM;Both;10 reps    Flexion AAROM;Both;10 reps   with dowel monitoring for stretch on left breast opening   ABduction AAROM;Both;10 reps    Theraband Level (Shoulder ABduction) --   monitoring for skin stretch     Manual Therapy   Manual Therapy Passive ROM    Passive ROM to both shoulder for stretch of left breast open area                    PT Education - 02/04/21 1810     Education Details add dowel stretches with attention not to feel pulling in breast incisoin, continue with core and hip exercises in supine and sidelying    Person(s) Educated Patient    Methods Explanation    Comprehension Verbalized understanding;Returned demonstration              PT Short Term Goals - 01/29/21 2020       PT SHORT TERM GOAL #1   Title Pt will be independent in a home exercise program for general strength in preparation for return to work    Time 4    Period Weeks    Status New               PT Long Term Goals - 01/29/21 2021       PT LONG TERM GOAL #1   Title Pt will have 160 of right and left shoulder aduction without pain so that she can return to normal activities    Time 8    Period Weeks    Status New      PT LONG TERM  GOAL #2   Title Pt will have 155 degress of right and left shoulder flexion without pain so that she can return to normal activities    Time 8    Period Weeks    Status New      PT LONG TERM GOAL #3   Title Pt will be independent in a home exercise program for ongoing fitness  Time 8    Period Weeks      PT LONG TERM GOAL #4   Title Pt will be set up for regular SOZO screens to monitor for the occurance of lymphedema    Time 8    Period Weeks    Status New                   Plan - 02/04/21 1811     Clinical Impression Statement Pt reports she is feeling better ,but she still has wound on left breast that looks like it has thick yellow eschar that is thinner on medial side with small pink area of granulation visible. She has tightness in shoulders, but is ok to start with dowel exercises as these were done with monitoring wound and there did not appear to be any distortion or pulling on incision area. Added core exercises today in preparation for return to work    Personal Factors and Comorbidities Comorbidity 3+    Comorbidities previous surgery and radiation on left side , lymph nodes removed from both sides, previous chemo    Examination-Activity Limitations Hygiene/Grooming;Bed Mobility;Dressing    Examination-Participation Restrictions Other    Stability/Clinical Decision Making Stable/Uncomplicated    Rehab Potential Good    PT Frequency 3x / week    PT Duration 8 weeks    PT Treatment/Interventions ADLs/Self Care Home Management;Therapeutic exercise;Patient/family education;Manual techniques;Taping;Electrical Stimulation;Neuromuscular re-education;Balance training;DME Instruction;Passive range of motion    PT Next Visit Plan ROM with stretching to right shoulder, be careful with left shoulder until skin heals, progress core,LE strength, endurance training in preparation for return to work.Monitor for pulling on breast Add wall or UE ranger for stretches, scapular  retraction, consider supine scap series, :    Consulted and Agree with Plan of Care Patient             Patient will benefit from skilled therapeutic intervention in order to improve the following deficits and impairments:  Postural dysfunction, Pain, Decreased skin integrity, Increased muscle spasms, Impaired UE functional use, Increased fascial restricitons, Decreased strength, Decreased knowledge of use of DME, Decreased activity tolerance, Decreased scar mobility, Decreased range of motion, Decreased knowledge of precautions  Visit Diagnosis: Aftercare following surgery for neoplasm  Stiffness of left shoulder, not elsewhere classified  Abnormal posture  Other disturbances of skin sensation  Muscle weakness (generalized)  Malignant neoplasm of upper-inner quadrant of left breast in female, estrogen receptor positive ([place])     Problem List Patient Active Problem List   Diagnosis Date Noted   History of breast cancer 12/26/2020   Skin rash 04/29/2020   Port-A-Cath in place 06/02/2019   Intraductal papilloma of breast, right 04/21/2019   Genetic testing 03/31/2019   Family history of breast cancer    Family history of lung cancer    Malignant neoplasm of upper-inner quadrant of left breast in female, estrogen receptor positive ([place]) 03/21/2019   NASAL POLYP 09/22/2007   ALLERGIC RHINITIS 09/22/2007   ASTHMA 09/22/2007   PERSONAL HISTORY OF ALLERGY TO LATEX 09/22/2007   Donato Heinz. Owens Shark PT  Norwood Levo 02/04/2021, 6:18 PM  [place], [place], [address] Phone: [phone]   Fax:  [phone]  Name: Chelsea Hobbs MRN: [id number] Date of Birth: 03-Dec-1970

## 2021-02-04 NOTE — Patient Instructions (Signed)
Access Code: [email or web address] URL: [email or web address] Date: 02/04/2021 Prepared by: Maudry Diego  Exercises Supine Lower Trunk Rotation - 1 x daily - 7 x weekly - 1-3 sets - 10 reps Pilates Bridge - 1 x daily - 7 x weekly - 1-3 sets - 10 reps Supine Hip Internal and External Rotation - 1 x daily - 7 x weekly - 1-3 sets - 10 reps Dead Bug - 1 x daily - 7 x weekly - 1-3 sets - 10 reps Supine Shoulder Flexion Extension AAROM with Dowel - 1 x daily - 7 x weekly - 1-3 sets - 10 reps Supine Shoulder Abduction AAROM with Dowel - 1 x daily - 7 x weekly - 1-3 sets - 10 reps Sidelying Hip Abduction - 1 x daily - 7 x weekly - 1-3 sets - 10 reps Clam - 1 x daily - 7 x weekly - 1-3 sets - 10 reps

## 2021-02-06 ENCOUNTER — Ambulatory Visit: Payer: BC Managed Care – PPO | Admitting: Physical Therapy

## 2021-02-07 ENCOUNTER — Encounter (HOSPITAL_COMMUNITY)
Admission: RE | Admit: 2021-02-07 | Discharge: 2021-02-07 | Disposition: A | Discharge status: Home / Self Care | Payer: BC Managed Care – PPO | Source: Ambulatory Visit | Attending: Obstetrics and Gynecology | Admitting: Obstetrics and Gynecology

## 2021-02-07 ENCOUNTER — Inpatient Hospital Stay (HOSPITAL_COMMUNITY): Admission: RE | Admit: 2021-02-07 | Payer: BC Managed Care – PPO | Source: Ambulatory Visit

## 2021-02-07 ENCOUNTER — Other Ambulatory Visit (HOSPITAL_COMMUNITY)
Admission: RE | Admit: 2021-02-07 | Discharge: 2021-02-07 | Disposition: A | Discharge status: Home / Self Care | Payer: BC Managed Care – PPO | Source: Ambulatory Visit | Attending: Obstetrics and Gynecology | Admitting: Obstetrics and Gynecology

## 2021-02-07 ENCOUNTER — Other Ambulatory Visit: Payer: Self-pay

## 2021-02-07 ENCOUNTER — Encounter (HOSPITAL_BASED_OUTPATIENT_CLINIC_OR_DEPARTMENT_OTHER): Payer: Self-pay | Admitting: Obstetrics and Gynecology

## 2021-02-07 DIAGNOSIS — Z01812 Encounter for preprocedural laboratory examination: Secondary | ICD-10-CM | POA: Insufficient documentation

## 2021-02-07 DIAGNOSIS — Z20822 Contact with and (suspected) exposure to covid-19: Secondary | ICD-10-CM | POA: Insufficient documentation

## 2021-02-07 LAB — CBC
HCT: 38.3 % (ref 36.0–46.0)
Hemoglobin: 12.9 g/dL (ref 12.0–15.0)
MCH: 30.9 pg (ref 26.0–34.0)
MCHC: 33.7 g/dL (ref 30.0–36.0)
MCV: 91.6 fL (ref 80.0–100.0)
Platelets: 347 10*3/uL (ref 150–400)
RBC: 4.18 MIL/uL (ref 3.87–5.11)
RDW: 12.4 % (ref 11.5–15.5)
WBC: 7 10*3/uL (ref 4.0–10.5)
nRBC: 0 % (ref 0.0–0.2)

## 2021-02-07 LAB — SARS CORONAVIRUS 2 (TAT 6-24 HRS): SARS Coronavirus 2: NEGATIVE

## 2021-02-07 NOTE — Progress Notes (Signed)
Spoke w/ via phone for pre-op interview--- Pt Lab needs dos---- no              Lab results------ pt had CBC/ T&S done 02-07-2021 results in epic COVID test -----  had covid test 02-07-2021, result in epic Arrive at ------- 0530 on 02-11-2021 NPO after MN NO Solid Food.  Clear liquids from MN until--- 0430 Med rec completed Medications to take morning of surgery ----- Effexor, Arimidex Diabetic medication -----n/a Patient instructed no nail polish to be worn day of surgery Patient instructed to bring photo id and insurance card day of surgery Patient aware to have Driver (ride ) / caregiver for 24 hours after surgery -- husband, Chelsea Hobbs Patient Special Instructions ----- reviewed RCC and visitor guidelines.  Asked pt to bring rescue inhaler dos Pre-Op special Istructions ----- n/a Patient verbalized understanding of instructions that were given at this phone interview. Patient denies shortness of breath, chest pain, fever, cough at this phone interview.

## 2021-02-10 NOTE — Anesthesia Preprocedure Evaluation (Addendum)
Anesthesia Evaluation  Patient identified by MRN, date of birth, ID band Patient awake    Reviewed: Allergy & Precautions, NPO status , Patient's Chart, lab work & pertinent test results  Airway Mallampati: II  TM Distance: >3 FB Neck ROM: Full    Dental no notable dental hx.    Pulmonary neg pulmonary ROS,    Pulmonary exam normal breath sounds clear to auscultation       Cardiovascular Exercise Tolerance: Good negative cardio ROS Normal cardiovascular exam Rhythm:Regular Rate:Normal     Neuro/Psych  Neuromuscular disease (chemo neuropathy) negative psych ROS   GI/Hepatic negative GI ROS, Neg liver ROS,   Endo/Other  negative endocrine ROS  Renal/GU negative Renal ROS  negative genitourinary   Musculoskeletal negative musculoskeletal ROS (+)   Abdominal   Peds negative pediatric ROS (+)  Hematology negative hematology ROS (+)   Anesthesia Other Findings Breast cancer  Reproductive/Obstetrics negative OB ROS                            Anesthesia Physical Anesthesia Plan  ASA: 3  Anesthesia Plan: General   Post-op Pain Management:    Induction: Intravenous  PONV Risk Score and Plan: 3 and Treatment may vary due to age or medical condition, Ondansetron and Dexamethasone  Airway Management Planned: Oral ETT  Additional Equipment: None  Intra-op Plan:   Post-operative Plan: Extubation in OR  Informed Consent: I have reviewed the patients History and Physical, chart, labs and discussed the procedure including the risks, benefits and alternatives for the proposed anesthesia with the patient or authorized representative who has indicated his/her understanding and acceptance.     Dental advisory given  Plan Discussed with: Anesthesiologist, CRNA and Surgeon  Anesthesia Plan Comments:        Anesthesia Quick Evaluation

## 2021-02-11 ENCOUNTER — Other Ambulatory Visit: Payer: Self-pay

## 2021-02-11 ENCOUNTER — Ambulatory Visit (HOSPITAL_BASED_OUTPATIENT_CLINIC_OR_DEPARTMENT_OTHER): Payer: BC Managed Care – PPO | Admitting: Anesthesiology

## 2021-02-11 ENCOUNTER — Ambulatory Visit (HOSPITAL_BASED_OUTPATIENT_CLINIC_OR_DEPARTMENT_OTHER)
Admission: RE | Admit: 2021-02-11 | Discharge: 2021-02-11 | Disposition: A | Discharge status: Home / Self Care | Payer: BC Managed Care – PPO | Attending: Obstetrics and Gynecology | Admitting: Obstetrics and Gynecology

## 2021-02-11 ENCOUNTER — Encounter (HOSPITAL_BASED_OUTPATIENT_CLINIC_OR_DEPARTMENT_OTHER): Payer: Self-pay | Admitting: Obstetrics and Gynecology

## 2021-02-11 ENCOUNTER — Encounter (HOSPITAL_BASED_OUTPATIENT_CLINIC_OR_DEPARTMENT_OTHER)
Admission: RE | Disposition: A | Discharge status: Home / Self Care | Payer: Self-pay | Source: Home / Self Care | Attending: Obstetrics and Gynecology

## 2021-02-11 DIAGNOSIS — N838 Other noninflammatory disorders of ovary, fallopian tube and broad ligament: Secondary | ICD-10-CM | POA: Diagnosis not present

## 2021-02-11 DIAGNOSIS — N83202 Unspecified ovarian cyst, left side: Secondary | ICD-10-CM | POA: Insufficient documentation

## 2021-02-11 DIAGNOSIS — Z91040 Latex allergy status: Secondary | ICD-10-CM | POA: Diagnosis not present

## 2021-02-11 DIAGNOSIS — Z803 Family history of malignant neoplasm of breast: Secondary | ICD-10-CM | POA: Diagnosis not present

## 2021-02-11 DIAGNOSIS — N83201 Unspecified ovarian cyst, right side: Secondary | ICD-10-CM | POA: Insufficient documentation

## 2021-02-11 DIAGNOSIS — Z923 Personal history of irradiation: Secondary | ICD-10-CM | POA: Insufficient documentation

## 2021-02-11 DIAGNOSIS — C50212 Malignant neoplasm of upper-inner quadrant of left female breast: Secondary | ICD-10-CM | POA: Diagnosis not present

## 2021-02-11 DIAGNOSIS — Z1502 Genetic susceptibility to malignant neoplasm of ovary: Secondary | ICD-10-CM | POA: Diagnosis not present

## 2021-02-11 DIAGNOSIS — Z91041 Radiographic dye allergy status: Secondary | ICD-10-CM | POA: Insufficient documentation

## 2021-02-11 DIAGNOSIS — Z881 Allergy status to other antibiotic agents status: Secondary | ICD-10-CM | POA: Insufficient documentation

## 2021-02-11 DIAGNOSIS — Z91048 Other nonmedicinal substance allergy status: Secondary | ICD-10-CM | POA: Insufficient documentation

## 2021-02-11 DIAGNOSIS — N83291 Other ovarian cyst, right side: Secondary | ICD-10-CM | POA: Diagnosis not present

## 2021-02-11 DIAGNOSIS — J309 Allergic rhinitis, unspecified: Secondary | ICD-10-CM | POA: Diagnosis not present

## 2021-02-11 DIAGNOSIS — Z9221 Personal history of antineoplastic chemotherapy: Secondary | ICD-10-CM | POA: Diagnosis not present

## 2021-02-11 DIAGNOSIS — Z4002 Encounter for prophylactic removal of ovary: Secondary | ICD-10-CM | POA: Diagnosis not present

## 2021-02-11 DIAGNOSIS — Z170 Estrogen receptor positive status [ER+]: Secondary | ICD-10-CM | POA: Diagnosis not present

## 2021-02-11 DIAGNOSIS — N83292 Other ovarian cyst, left side: Secondary | ICD-10-CM | POA: Diagnosis not present

## 2021-02-11 DIAGNOSIS — Z853 Personal history of malignant neoplasm of breast: Secondary | ICD-10-CM | POA: Diagnosis not present

## 2021-02-11 HISTORY — DX: Drug-induced polyneuropathy: T45.1X5A

## 2021-02-11 HISTORY — DX: Drug-induced polyneuropathy: G62.0

## 2021-02-11 HISTORY — DX: Unspecified asthma, uncomplicated: J45.909

## 2021-02-11 HISTORY — DX: Personal history of other diseases of the digestive system: Z87.19

## 2021-02-11 HISTORY — PX: LAPAROSCOPIC SALPINGO OOPHERECTOMY: SHX5927

## 2021-02-11 LAB — TYPE AND SCREEN
ABO/RH(D): A POS
Antibody Screen: NEGATIVE

## 2021-02-11 LAB — POCT PREGNANCY, URINE: Preg Test, Ur: NEGATIVE

## 2021-02-11 SURGERY — SALPINGO-OOPHORECTOMY, LAPAROSCOPIC
Anesthesia: General | Site: Abdomen | Laterality: Bilateral

## 2021-02-11 MED ORDER — POVIDONE-IODINE 10 % EX SWAB: 2.0000 "application " | Freq: Once | CUTANEOUS | Status: DC

## 2021-02-11 MED ORDER — PHENYLEPHRINE 40 MCG/ML (10ML) SYRINGE FOR IV PUSH (FOR BLOOD PRESSURE SUPPORT)
PREFILLED_SYRINGE | INTRAVENOUS | Status: AC
  Filled 2021-02-11: qty 10

## 2021-02-11 MED ORDER — PROPOFOL 10 MG/ML IV BOLUS
INTRAVENOUS | Status: DC | PRN
  Administered 2021-02-11: 30 mg via INTRAVENOUS
  Administered 2021-02-11: 150 mg via INTRAVENOUS

## 2021-02-11 MED ORDER — PROPOFOL 10 MG/ML IV BOLUS
INTRAVENOUS | Status: AC
  Filled 2021-02-11: qty 40

## 2021-02-11 MED ORDER — LACTATED RINGERS IV SOLN: INTRAVENOUS | Status: DC

## 2021-02-11 MED ORDER — FENTANYL CITRATE (PF) 100 MCG/2ML IJ SOLN
INTRAMUSCULAR | Status: AC
  Filled 2021-02-11: qty 2

## 2021-02-11 MED ORDER — FENTANYL CITRATE (PF) 100 MCG/2ML IJ SOLN
INTRAMUSCULAR | Status: DC | PRN
  Administered 2021-02-11: 50 ug via INTRAVENOUS

## 2021-02-11 MED ORDER — SUGAMMADEX SODIUM 200 MG/2ML IV SOLN
INTRAVENOUS | Status: DC | PRN
  Administered 2021-02-11: 140 mg via INTRAVENOUS

## 2021-02-11 MED ORDER — PHENYLEPHRINE 40 MCG/ML (10ML) SYRINGE FOR IV PUSH (FOR BLOOD PRESSURE SUPPORT)
PREFILLED_SYRINGE | INTRAVENOUS | Status: DC | PRN
  Administered 2021-02-11: 120 ug via INTRAVENOUS

## 2021-02-11 MED ORDER — AMISULPRIDE (ANTIEMETIC) 5 MG/2ML IV SOLN: 10.0000 mg | Freq: Once | INTRAVENOUS | Status: DC | PRN

## 2021-02-11 MED ORDER — ACETAMINOPHEN 500 MG PO TABS
1000.0000 mg | ORAL_TABLET | Freq: Once | ORAL | Status: AC
  Administered 2021-02-11: 1000 mg via ORAL

## 2021-02-11 MED ORDER — ONDANSETRON HCL 4 MG/2ML IJ SOLN
INTRAMUSCULAR | Status: AC
  Filled 2021-02-11: qty 2

## 2021-02-11 MED ORDER — ROCURONIUM BROMIDE 10 MG/ML (PF) SYRINGE
PREFILLED_SYRINGE | INTRAVENOUS | Status: AC
  Filled 2021-02-11: qty 10

## 2021-02-11 MED ORDER — ACETAMINOPHEN 500 MG PO TABS
ORAL_TABLET | ORAL | Status: AC
  Filled 2021-02-11: qty 2

## 2021-02-11 MED ORDER — OXYCODONE HCL 5 MG PO TABS
5.0000 mg | ORAL_TABLET | ORAL | 0 refills | Status: DC | PRN
Start: 2021-02-11 — End: 2022-07-20

## 2021-02-11 MED ORDER — OXYCODONE HCL 5 MG/5ML PO SOLN: 5.0000 mg | Freq: Once | ORAL | Status: AC | PRN

## 2021-02-11 MED ORDER — LIDOCAINE HCL (CARDIAC) PF 100 MG/5ML IV SOSY
PREFILLED_SYRINGE | INTRAVENOUS | Status: DC | PRN
Start: 2021-02-11 — End: 2021-02-11
  Administered 2021-02-11: 80 mg via INTRAVENOUS

## 2021-02-11 MED ORDER — MIDAZOLAM HCL 2 MG/2ML IJ SOLN
INTRAMUSCULAR | Status: AC
  Filled 2021-02-11: qty 2

## 2021-02-11 MED ORDER — PROMETHAZINE HCL 25 MG/ML IJ SOLN: 6.2500 mg | INTRAMUSCULAR | Status: DC | PRN

## 2021-02-11 MED ORDER — DEXAMETHASONE SODIUM PHOSPHATE 4 MG/ML IJ SOLN
INTRAMUSCULAR | Status: DC | PRN
  Administered 2021-02-11: 8 mg via INTRAVENOUS

## 2021-02-11 MED ORDER — OXYCODONE HCL 5 MG PO TABS
5.0000 mg | ORAL_TABLET | Freq: Once | ORAL | Status: AC | PRN
  Administered 2021-02-11: 5 mg via ORAL

## 2021-02-11 MED ORDER — MIDAZOLAM HCL 5 MG/5ML IJ SOLN
INTRAMUSCULAR | Status: DC | PRN
Start: 2021-02-11 — End: 2021-02-11
  Administered 2021-02-11: 2 mg via INTRAVENOUS

## 2021-02-11 MED ORDER — KETOROLAC TROMETHAMINE 30 MG/ML IJ SOLN
INTRAMUSCULAR | Status: DC | PRN
  Administered 2021-02-11: 30 mg via INTRAVENOUS

## 2021-02-11 MED ORDER — ROCURONIUM BROMIDE 100 MG/10ML IV SOLN
INTRAVENOUS | Status: DC | PRN
  Administered 2021-02-11: 50 mg via INTRAVENOUS

## 2021-02-11 MED ORDER — EPHEDRINE SULFATE-NACL 50-0.9 MG/10ML-% IV SOSY
PREFILLED_SYRINGE | INTRAVENOUS | Status: DC | PRN
  Administered 2021-02-11: 10 mg via INTRAVENOUS

## 2021-02-11 MED ORDER — OXYCODONE HCL 5 MG PO TABS
ORAL_TABLET | ORAL | Status: AC
  Filled 2021-02-11: qty 1

## 2021-02-11 MED ORDER — DOCUSATE SODIUM 100 MG PO CAPS: 100.0000 mg | ORAL_CAPSULE | Freq: Two times a day (BID) | ORAL | 2 refills | Status: AC

## 2021-02-11 MED ORDER — BUPIVACAINE HCL (PF) 0.25 % IJ SOLN
INTRAMUSCULAR | Status: DC | PRN
  Administered 2021-02-11: 8 mL

## 2021-02-11 MED ORDER — ONDANSETRON HCL 4 MG/2ML IJ SOLN
INTRAMUSCULAR | Status: DC | PRN
  Administered 2021-02-11: 4 mg via INTRAVENOUS

## 2021-02-11 MED ORDER — IBUPROFEN 600 MG PO TABS: 600.0000 mg | ORAL_TABLET | Freq: Four times a day (QID) | ORAL | 0 refills | Status: AC | PRN

## 2021-02-11 MED ORDER — DEXAMETHASONE SODIUM PHOSPHATE 10 MG/ML IJ SOLN
INTRAMUSCULAR | Status: AC
  Filled 2021-02-11: qty 1

## 2021-02-11 MED ORDER — KETOROLAC TROMETHAMINE 30 MG/ML IJ SOLN
INTRAMUSCULAR | Status: AC
  Filled 2021-02-11: qty 1

## 2021-02-11 MED ORDER — FENTANYL CITRATE (PF) 100 MCG/2ML IJ SOLN: 25.0000 ug | INTRAMUSCULAR | Status: DC | PRN

## 2021-02-11 MED ORDER — ACETAMINOPHEN 500 MG PO TABS: 1000.0000 mg | ORAL_TABLET | ORAL | Status: DC

## 2021-02-11 SURGICAL SUPPLY — 39 items
ADH SKN CLS APL DERMABOND .7 (GAUZE/BANDAGES/DRESSINGS) ×1
APL SKNCLS STERI-STRIP NONHPOA (GAUZE/BANDAGES/DRESSINGS) ×1
BAG RETRIEVAL 10 (BASKET) ×2
BENZOIN TINCTURE PRP APPL 2/3 (GAUZE/BANDAGES/DRESSINGS) ×2 IMPLANT
CABLE HIGH FREQUENCY MONO STRZ (ELECTRODE) IMPLANT
CATH ROBINSON RED A/P 16FR (CATHETERS) ×2 IMPLANT
COVER MAYO STAND STRL (DRAPES) ×2 IMPLANT
DERMABOND ADVANCED (GAUZE/BANDAGES/DRESSINGS) ×1
DERMABOND ADVANCED .7 DNX12 (GAUZE/BANDAGES/DRESSINGS) ×1 IMPLANT
DRSG OPSITE POSTOP 3X4 (GAUZE/BANDAGES/DRESSINGS) ×2 IMPLANT
DRSG TELFA 3X8 NADH (GAUZE/BANDAGES/DRESSINGS) ×2 IMPLANT
DURAPREP 26ML APPLICATOR (WOUND CARE) ×2 IMPLANT
GAUZE 4X4 16PLY ~~LOC~~+RFID DBL (SPONGE) ×4 IMPLANT
GLOVE SURG ENC MOIS LTX SZ6.5 (GLOVE) ×2 IMPLANT
GLOVE SURG UNDER POLY LF SZ6.5 (GLOVE) ×2 IMPLANT
GOWN STRL REUS W/TWL LRG LVL3 (GOWN DISPOSABLE) ×4 IMPLANT
IV NS 1000ML (IV SOLUTION)
IV NS 1000ML BAXH (IV SOLUTION) IMPLANT
KIT TURNOVER CYSTO (KITS) ×2 IMPLANT
LIGASURE LAP L-HOOKWIRE 5 44CM (INSTRUMENTS) ×2 IMPLANT
NEEDLE HYPO 25X1 1.5 SAFETY (NEEDLE) ×2 IMPLANT
NEEDLE INSUFFLATION 120MM (ENDOMECHANICALS) ×2 IMPLANT
NS IRRIG 500ML POUR BTL (IV SOLUTION) ×2 IMPLANT
PACK LAPAROSCOPY BASIN (CUSTOM PROCEDURE TRAY) ×2 IMPLANT
SCISSORS LAP 5X35 DISP (ENDOMECHANICALS) IMPLANT
SCISSORS LAP 5X45 EPIX DISP (ENDOMECHANICALS) IMPLANT
SET SUCTION IRRIG HYDROSURG (IRRIGATION / IRRIGATOR) IMPLANT
SET TUBE SMOKE EVAC HIGH FLOW (TUBING) ×2 IMPLANT
SUT VICRYL 0 UR6 27IN ABS (SUTURE) ×2 IMPLANT
SUT VICRYL RAPIDE 4/0 PS 2 (SUTURE) ×4 IMPLANT
SYR 50ML LL SCALE MARK (SYRINGE) IMPLANT
SYS BAG RETRIEVAL 10MM (BASKET) ×2
SYSTEM BAG RETRIEVAL 10MM (BASKET) ×2 IMPLANT
TAPE PAPER 3X10 WHT MICROPORE (GAUZE/BANDAGES/DRESSINGS) ×2 IMPLANT
TOWEL OR 17X26 10 PK STRL BLUE (TOWEL DISPOSABLE) ×4 IMPLANT
TROCAR BLADELESS OPT 5 100 (ENDOMECHANICALS) ×2 IMPLANT
TROCAR XCEL NON-BLD 11X100MML (ENDOMECHANICALS) ×4 IMPLANT
WARMER LAPAROSCOPE (MISCELLANEOUS) ×2 IMPLANT
WATER STERILE IRR 500ML POUR (IV SOLUTION) ×2 IMPLANT

## 2021-02-11 NOTE — Anesthesia Procedure Notes (Signed)
Procedure Name: Intubation Date/Time: 02/11/2021 7:41 AM Performed by: Bonney Aid, CRNA Pre-anesthesia Checklist: Patient identified, Emergency Drugs available, Suction available and Patient being monitored Patient Re-evaluated:Patient Re-evaluated prior to induction Oxygen Delivery Method: Circle system utilized Preoxygenation: Pre-oxygenation with 100% oxygen Induction Type: IV induction Ventilation: Mask ventilation without difficulty Laryngoscope Size: Mac and 3 Grade View: Grade I Tube type: Oral Number of attempts: 1 Airway Equipment and Method: Stylet Placement Confirmation: ETT inserted through vocal cords under direct vision, positive ETCO2 and breath sounds checked- equal and bilateral Secured at: 20 cm Tube secured with: Tape Dental Injury: Teeth and Oropharynx as per pre-operative assessment

## 2021-02-11 NOTE — Progress Notes (Signed)
No updates to above H&P. Patient arrived NPO and was consented in PACU. Risks again discussed, all questions answered, and consent signed. Proceed with above surgery. Given allergy to dermabond (NOT ADHESIVE TAPE per patient), will avoid dermabond and use honeycomb dressing.    Lucillie Garfinkel MD

## 2021-02-11 NOTE — Discharge Instructions (Addendum)
  Post Anesthesia Home Care Instructions  Activity: Get plenty of rest for the remainder of the day. A responsible individual must stay with you for 24 hours following the procedure.  For the next 24 hours, DO NOT: -Drive a car -Paediatric nurse -Drink alcoholic beverages -Take any medication unless instructed by your physician -Make any legal decisions or sign important papers.  Meals: Start with liquid foods such as gelatin or soup. Progress to regular foods as tolerated. Avoid greasy, spicy, heavy foods. If nausea and/or vomiting occur, drink only clear liquids until the nausea and/or vomiting subsides. Call your physician if vomiting continues.  Special Instructions/Symptoms: Your throat may feel dry or sore from the anesthesia or the breathing tube placed in your throat during surgery. If this causes discomfort, gargle with warm salt water. The discomfort should disappear within 24 hours.  DISCHARGE INSTRUCTIONS: Laparoscopy  The following instructions have been prepared to help you care for yourself upon your return home today.  Wound care:  Do not get the incision wet for the first 24 hours. The incision should be kept clean and dry.  The Band-Aids or dressings may be removed the day after surgery.  Should the incision become sore, red, and swollen after the first week, check with your doctor.  Personal hygiene:  Shower the day after your procedure.  Activity and limitations:  Do NOT drive or operate any equipment today.  Do NOT lift anything more than 15 pounds for 2-3 weeks after surgery.  Do NOT rest in bed all day.  Walking is encouraged. Walk each day, starting slowly with 5-minute walks 3 or 4 times a day. Slowly increase the length of your walks.  Walk up and down stairs slowly.  Do NOT do strenuous activities, such as golfing, playing tennis, bowling, running, biking, weight lifting, gardening, mowing, or vacuuming for 2-4 weeks. Ask your doctor when it is okay to  start.  Diet: Eat a light meal as desired this evening. You may resume your usual diet tomorrow.  Return to work: This is dependent on the type of work you do. For the most part you can return to a desk job within a week of surgery. If you are more active at work, please discuss this with your doctor.  What to expect after your surgery: You may have a slight burning sensation when you urinate on the first day. You may have a very small amount of blood in the urine. Expect to have a small amount of vaginal discharge/light bleeding for 1-2 weeks. It is not unusual to have abdominal soreness and bruising for up to 2 weeks. You may be tired and need more rest for about 1 week. You may experience shoulder pain for 24-72 hours. Lying flat in bed may relieve it.  Call your doctor for any of the following:  Develop a fever of 100.4 or greater  Inability to urinate 6 hours after discharge from hospital  Severe pain not relieved by pain medications  Persistent of heavy bleeding at incision site  Redness or swelling around incision site after a week  Increasing nausea or vomiting  Take Tylenol at 12 Pm as needed for pain.

## 2021-02-11 NOTE — Anesthesia Postprocedure Evaluation (Signed)
Anesthesia Post Note  Patient: Chelsea Hobbs  Procedure(s) Performed: LAPAROSCOPIC BILATERAL SALPINGO OOPHORECTOMY (Bilateral: Abdomen)     Patient location during evaluation: PACU Anesthesia Type: General Level of consciousness: awake Pain management: pain level controlled Vital Signs Assessment: post-procedure vital signs reviewed and stable Respiratory status: spontaneous breathing and respiratory function stable Cardiovascular status: stable Postop Assessment: no apparent nausea or vomiting Anesthetic complications: no   No notable events documented.  Last Vitals:  Vitals:   02/11/21 1000 02/11/21 1041  BP: 107/76 110/73  Pulse: 77 77  Resp: 18 (!) 169  Temp:  36.4 C  SpO2: 96% 98%    Last Pain:  Vitals:   02/11/21 1041  TempSrc:   PainSc: 2                  Merlinda Frederick

## 2021-02-11 NOTE — Transfer of Care (Signed)
Immediate Anesthesia Transfer of Care Note  Patient: Chelsea Hobbs  Procedure(s) Performed: LAPAROSCOPIC BILATERAL SALPINGO OOPHORECTOMY (Bilateral: Abdomen)  Patient Location: PACU  Anesthesia Type:General  Level of Consciousness: awake, alert  and oriented  Airway & Oxygen Therapy: Patient Spontanous Breathing and Patient connected to nasal cannula oxygen  Post-op Assessment: Report given to RN  Post vital signs: Reviewed and stable  Last Vitals:  Vitals Value Taken Time  BP 115/67   Temp    Pulse 80   Resp 20   SpO2 95%     Last Pain:  Vitals:   02/11/21 0611  TempSrc: Oral  PainSc: 0-No pain      Patients Stated Pain Goal: 5 ([id number])  Complications: No notable events documented.

## 2021-02-11 NOTE — Op Note (Signed)
  PREOPERATIVE DIAGNOSES: 1. Desires  risk reducing bilateral salpingo-oophorectomy  POSTOPERATIVE DIAGNOSES: 1. Same  PROCEDURE PERFORMED: Laparoscopic bilateral salpingo-oophorectomy  SURGEON: Dr. Lucillie Garfinkel ASSISTANT: None  ANESTHESIA: General   ESTIMATED BLOOD LOSS: 5cc.  URINE OUTPUT: 20 cc of clear urine  COMPLICATIONS: None  TUBES: None.  DRAINS: None  PATHOLOGY: bilateral ovaries with fallopian tubes  FINDINGS: On exam, under anesthesia, normal appearing vulva and vagina, a normal sized uterus  Operative findings demonstrated a normal uterus. Normal fallopian tubes and ovaries bilaterally with some mild adhesive disease. The bowel and omentum were normal appearing.  Procedure: A general anesthesia was induced and the patient was placed in the dirsal lithotomy position. The abdomen, perineum, and vagina were prepped and draped in the usual fashion. The bladder was drained. After the initial preparation, the procedure commenced at the vagina. With a speculum in place to visualize the cervix, the cervix was grasped and a jacobs tenaculum was placed within the uterine cavity for manipulation purposes being careful not to puncture the uterus. Attention was then turned to the abdomen.    An infraumbilical incision was made and the Veress needle was gently advanced taking care to feel for the typical sensation of penetrating the peritoneum. With CO2 infiltration, an opening pressure of 4 mmHg was noted, and following this, a pneumoperitoneum of 15 mmHg was created. A 11 mm trocar was then passed through the same incision and the laparoscope was then inserted through the trocar sleeve.  Visualization of the peritoneal cavity was then obtained and a brief inspection did not reveal any signs of complications from entry. Under direct observation, 50m suprapubic port was placed taking care to respect anatomical landmarks and vessels. Once the placement of the port was complete, the  actual laparoscopic procedure began. Above operative findings noted.   Beginning on the right side, the Infundibular ligament was identified by lifting the tube towards the anterior wall of the abdomen. The ureter was confirmed along the pelvic side wall and peristalsis was noted. The ligasure device was then used to clamp and ligate the IP ligament in three sequential bites. The IP was then cut middistance, again being sure to be clear of the ureter. Following this, the broad ligament was sequentially grasped, ligated, and cut in the direction of the round ligament hugging next to the fallopian tube. The same was done on the left after some mild adhesive disease was sharply dissected.  An endocatch bag was then inserted and the specimens were carefully placed in it and removed.   All gas removed from abdomen and all instruments were removed. Fascia was closed with 0' vicryl and skin closed with 4'0 vicryl. The sponge and lap counts were correct times 2 at this time.  Jacobs tenaculum was removed.   The patient's procedure was terminated. We then awakened her. She was sent to the Recovery Room in good condition.

## 2021-02-12 ENCOUNTER — Encounter (HOSPITAL_BASED_OUTPATIENT_CLINIC_OR_DEPARTMENT_OTHER): Payer: Self-pay | Admitting: Obstetrics and Gynecology

## 2021-02-14 LAB — SURGICAL PATHOLOGY

## 2021-02-24 DIAGNOSIS — D509 Iron deficiency anemia, unspecified: Secondary | ICD-10-CM | POA: Diagnosis not present

## 2021-03-24 ENCOUNTER — Other Ambulatory Visit: Payer: Self-pay

## 2021-03-24 ENCOUNTER — Encounter (HOSPITAL_BASED_OUTPATIENT_CLINIC_OR_DEPARTMENT_OTHER): Payer: Self-pay | Admitting: Plastic Surgery

## 2021-03-31 NOTE — Progress Notes (Signed)

## 2021-03-31 NOTE — Anesthesia Preprocedure Evaluation (Addendum)
Anesthesia Evaluation  Patient identified by MRN, date of birth, ID band Patient awake    Reviewed: Allergy & Precautions, NPO status , Patient's Chart, lab work & pertinent test results  Airway Mallampati: II  TM Distance: >3 FB Neck ROM: Full    Dental no notable dental hx. (+) Teeth Intact, Dental Advisory Given   Pulmonary asthma ,    Pulmonary exam normal breath sounds clear to auscultation       Cardiovascular (-) hypertensionNormal cardiovascular exam Rhythm:Regular Rate:Normal  05/2020 echo 1. Left ventricular ejection fraction, by estimation, is 55 to 60%. The  left ventricle has normal function. The left ventricle has no regional  wall motion abnormalities. Left ventricular diastolic parameters are  consistent with Grade I diastolic  dysfunction (impaired relaxation). The average left ventricular global  longitudinal strain is -18.8 %. The global longitudinal strain is normal.  2. Right ventricular systolic function is normal. The right ventricular  size is normal.  3. The mitral valve is normal in structure. No evidence of mitral valve  regurgitation. No evidence of mitral stenosis.  4. The aortic valve is normal in structure. Aortic valve regurgitation is  not visualized. No aortic stenosis is present.  5. The inferior vena cava is normal in size with greater than 50%  respiratory variability, suggesting right atrial pressure of 3 mmHg.    Neuro/Psych negative neurological ROS  negative psych ROS   GI/Hepatic   Endo/Other  negative endocrine ROS  Renal/GU negative Renal ROS     Musculoskeletal   Abdominal   Peds  Hematology negative hematology ROS (+)   Anesthesia Other Findings All: latex,tetracycline, gadolinium  Reproductive/Obstetrics                           Anesthesia Physical Anesthesia Plan  ASA: 2  Anesthesia Plan: General   Post-op Pain Management:     Induction: Intravenous  PONV Risk Score and Plan: 4 or greater and Treatment may vary due to age or medical condition  Airway Management Planned: Oral ETT  Additional Equipment: None  Intra-op Plan:   Post-operative Plan: Extubation in OR  Informed Consent: I have reviewed the patients History and Physical, chart, labs and discussed the procedure including the risks, benefits and alternatives for the proposed anesthesia with the patient or authorized representative who has indicated his/her understanding and acceptance.     Dental advisory given  Plan Discussed with:   Anesthesia Plan Comments: (GA ETT)       Anesthesia Quick Evaluation

## 2021-04-01 ENCOUNTER — Ambulatory Visit (HOSPITAL_BASED_OUTPATIENT_CLINIC_OR_DEPARTMENT_OTHER): Payer: BC Managed Care – PPO | Admitting: Anesthesiology

## 2021-04-01 ENCOUNTER — Ambulatory Visit (HOSPITAL_BASED_OUTPATIENT_CLINIC_OR_DEPARTMENT_OTHER)
Admission: RE | Admit: 2021-04-01 | Discharge: 2021-04-01 | Disposition: A | Discharge status: Home / Self Care | Payer: BC Managed Care – PPO | Attending: Plastic Surgery | Admitting: Plastic Surgery

## 2021-04-01 ENCOUNTER — Other Ambulatory Visit: Payer: Self-pay

## 2021-04-01 ENCOUNTER — Encounter (HOSPITAL_BASED_OUTPATIENT_CLINIC_OR_DEPARTMENT_OTHER): Payer: Self-pay | Admitting: Plastic Surgery

## 2021-04-01 ENCOUNTER — Encounter (HOSPITAL_BASED_OUTPATIENT_CLINIC_OR_DEPARTMENT_OTHER)
Admission: RE | Disposition: A | Discharge status: Home / Self Care | Payer: Self-pay | Source: Home / Self Care | Attending: Plastic Surgery

## 2021-04-01 DIAGNOSIS — Z923 Personal history of irradiation: Secondary | ICD-10-CM | POA: Diagnosis not present

## 2021-04-01 DIAGNOSIS — Z9013 Acquired absence of bilateral breasts and nipples: Secondary | ICD-10-CM | POA: Insufficient documentation

## 2021-04-01 DIAGNOSIS — Z803 Family history of malignant neoplasm of breast: Secondary | ICD-10-CM | POA: Insufficient documentation

## 2021-04-01 DIAGNOSIS — D0511 Intraductal carcinoma in situ of right breast: Secondary | ICD-10-CM | POA: Insufficient documentation

## 2021-04-01 DIAGNOSIS — Z421 Encounter for breast reconstruction following mastectomy: Secondary | ICD-10-CM | POA: Diagnosis not present

## 2021-04-01 DIAGNOSIS — Z853 Personal history of malignant neoplasm of breast: Secondary | ICD-10-CM | POA: Diagnosis not present

## 2021-04-01 HISTORY — PX: REMOVAL OF BILATERAL TISSUE EXPANDERS WITH PLACEMENT OF BILATERAL BREAST IMPLANTS: SHX6431

## 2021-04-01 HISTORY — PX: LIPOSUCTION WITH LIPOFILLING: SHX6436

## 2021-04-01 SURGERY — REMOVAL, TISSUE EXPANDER, BREAST, BILATERAL, WITH BILATERAL IMPLANT IMPLANT INSERTION
Anesthesia: General | Site: Chest | Laterality: Bilateral

## 2021-04-01 MED ORDER — DEXAMETHASONE SODIUM PHOSPHATE 10 MG/ML IJ SOLN
INTRAMUSCULAR | Status: AC
  Filled 2021-04-01: qty 1

## 2021-04-01 MED ORDER — ONDANSETRON HCL 4 MG/2ML IJ SOLN
INTRAMUSCULAR | Status: AC
  Filled 2021-04-01: qty 2

## 2021-04-01 MED ORDER — DEXMEDETOMIDINE HCL IN NACL 200 MCG/50ML IV SOLN
INTRAVENOUS | Status: DC | PRN
  Administered 2021-04-01: 16 ug via INTRAVENOUS

## 2021-04-01 MED ORDER — HYDROMORPHONE HCL 1 MG/ML IJ SOLN
INTRAMUSCULAR | Status: AC
  Filled 2021-04-01: qty 0.5

## 2021-04-01 MED ORDER — PHENYLEPHRINE HCL-NACL 20-0.9 MG/250ML-% IV SOLN
INTRAVENOUS | Status: DC | PRN
  Administered 2021-04-01: 40 ug/min via INTRAVENOUS

## 2021-04-01 MED ORDER — HYDROMORPHONE HCL 1 MG/ML IJ SOLN
0.2500 mg | INTRAMUSCULAR | Status: DC | PRN
  Administered 2021-04-01: 0.25 mg via INTRAVENOUS

## 2021-04-01 MED ORDER — OXYCODONE HCL 5 MG PO TABS
ORAL_TABLET | ORAL | Status: AC
  Filled 2021-04-01: qty 1

## 2021-04-01 MED ORDER — PHENYLEPHRINE 40 MCG/ML (10ML) SYRINGE FOR IV PUSH (FOR BLOOD PRESSURE SUPPORT)
PREFILLED_SYRINGE | INTRAVENOUS | Status: AC
  Filled 2021-04-01: qty 10

## 2021-04-01 MED ORDER — SODIUM BICARBONATE 4.2 % IV SOLN
INTRAVENOUS | Status: DC | PRN
  Administered 2021-04-01: 250 mL via INTRAMUSCULAR

## 2021-04-01 MED ORDER — ROCURONIUM BROMIDE 100 MG/10ML IV SOLN
INTRAVENOUS | Status: DC | PRN
  Administered 2021-04-01: 50 mg via INTRAVENOUS
  Administered 2021-04-01: 20 mg via INTRAVENOUS

## 2021-04-01 MED ORDER — DEXAMETHASONE SODIUM PHOSPHATE 4 MG/ML IJ SOLN
INTRAMUSCULAR | Status: DC | PRN
  Administered 2021-04-01: 10 mg via INTRAVENOUS

## 2021-04-01 MED ORDER — ATROPINE SULFATE 0.4 MG/ML IJ SOLN
INTRAMUSCULAR | Status: AC
  Filled 2021-04-01: qty 1

## 2021-04-01 MED ORDER — PROPOFOL 10 MG/ML IV BOLUS
INTRAVENOUS | Status: DC | PRN
Start: 2021-04-01 — End: 2021-04-01
  Administered 2021-04-01: 100 mg via INTRAVENOUS

## 2021-04-01 MED ORDER — CEFAZOLIN SODIUM-DEXTROSE 2-4 GM/100ML-% IV SOLN
2.0000 g | INTRAVENOUS | Status: AC
  Administered 2021-04-01: 2 g via INTRAVENOUS

## 2021-04-01 MED ORDER — CHLORHEXIDINE GLUCONATE CLOTH 2 % EX PADS: 6.0000 | MEDICATED_PAD | Freq: Once | CUTANEOUS | Status: DC

## 2021-04-01 MED ORDER — OXYCODONE HCL 5 MG/5ML PO SOLN: 5.0000 mg | Freq: Once | ORAL | Status: AC | PRN

## 2021-04-01 MED ORDER — FENTANYL CITRATE (PF) 100 MCG/2ML IJ SOLN
INTRAMUSCULAR | Status: DC | PRN
  Administered 2021-04-01: 50 ug via INTRAVENOUS

## 2021-04-01 MED ORDER — LIDOCAINE HCL (CARDIAC) PF 100 MG/5ML IV SOSY
PREFILLED_SYRINGE | INTRAVENOUS | Status: DC | PRN
  Administered 2021-04-01: 100 mg via INTRAVENOUS

## 2021-04-01 MED ORDER — SODIUM CHLORIDE 0.9 % IV SOLN
INTRAVENOUS | Status: AC
  Filled 2021-04-01: qty 10

## 2021-04-01 MED ORDER — ACETAMINOPHEN 10 MG/ML IV SOLN: 1000.0000 mg | Freq: Once | INTRAVENOUS | Status: DC | PRN

## 2021-04-01 MED ORDER — CEFAZOLIN SODIUM-DEXTROSE 2-4 GM/100ML-% IV SOLN
INTRAVENOUS | Status: AC
  Filled 2021-04-01: qty 100

## 2021-04-01 MED ORDER — SUCCINYLCHOLINE CHLORIDE 200 MG/10ML IV SOSY
PREFILLED_SYRINGE | INTRAVENOUS | Status: AC
  Filled 2021-04-01: qty 10

## 2021-04-01 MED ORDER — MIDAZOLAM HCL 2 MG/2ML IJ SOLN
INTRAMUSCULAR | Status: AC
  Filled 2021-04-01: qty 2

## 2021-04-01 MED ORDER — LACTATED RINGERS IV SOLN: INTRAVENOUS | Status: DC

## 2021-04-01 MED ORDER — SODIUM BICARBONATE 4.2 % IV SOLN
INTRAVENOUS | Status: AC
  Filled 2021-04-01: qty 20

## 2021-04-01 MED ORDER — CELECOXIB 200 MG PO CAPS
ORAL_CAPSULE | ORAL | Status: AC
  Filled 2021-04-01: qty 1

## 2021-04-01 MED ORDER — EPHEDRINE SULFATE 50 MG/ML IJ SOLN
INTRAMUSCULAR | Status: DC | PRN
  Administered 2021-04-01: 10 mg via INTRAVENOUS

## 2021-04-01 MED ORDER — OXYCODONE HCL 5 MG PO TABS
5.0000 mg | ORAL_TABLET | Freq: Once | ORAL | Status: AC | PRN
  Administered 2021-04-01: 5 mg via ORAL

## 2021-04-01 MED ORDER — SODIUM CHLORIDE 0.9 % IV SOLN
INTRAVENOUS | Status: DC | PRN
  Administered 2021-04-01: 500 mL

## 2021-04-01 MED ORDER — ONDANSETRON HCL 4 MG/2ML IJ SOLN: 4.0000 mg | Freq: Once | INTRAMUSCULAR | Status: DC | PRN

## 2021-04-01 MED ORDER — EPHEDRINE 5 MG/ML INJ
INTRAVENOUS | Status: AC
  Filled 2021-04-01: qty 5

## 2021-04-01 MED ORDER — GABAPENTIN 300 MG PO CAPS
ORAL_CAPSULE | ORAL | Status: AC
  Filled 2021-04-01: qty 1

## 2021-04-01 MED ORDER — AMISULPRIDE (ANTIEMETIC) 5 MG/2ML IV SOLN: 10.0000 mg | Freq: Once | INTRAVENOUS | Status: DC | PRN

## 2021-04-01 MED ORDER — ONDANSETRON HCL 4 MG/2ML IJ SOLN
INTRAMUSCULAR | Status: DC | PRN
  Administered 2021-04-01: 4 mg via INTRAVENOUS

## 2021-04-01 MED ORDER — FENTANYL CITRATE (PF) 100 MCG/2ML IJ SOLN
INTRAMUSCULAR | Status: AC
  Filled 2021-04-01: qty 2

## 2021-04-01 MED ORDER — DEXMEDETOMIDINE (PRECEDEX) IN NS 20 MCG/5ML (4 MCG/ML) IV SYRINGE
PREFILLED_SYRINGE | INTRAVENOUS | Status: AC
  Filled 2021-04-01: qty 5

## 2021-04-01 MED ORDER — GABAPENTIN 300 MG PO CAPS
300.0000 mg | ORAL_CAPSULE | ORAL | Status: AC
  Administered 2021-04-01: 300 mg via ORAL

## 2021-04-01 MED ORDER — CELECOXIB 200 MG PO CAPS
200.0000 mg | ORAL_CAPSULE | ORAL | Status: AC
  Administered 2021-04-01: 200 mg via ORAL

## 2021-04-01 MED ORDER — EPINEPHRINE PF 1 MG/ML IJ SOLN
INTRAMUSCULAR | Status: AC
  Filled 2021-04-01: qty 1

## 2021-04-01 MED ORDER — LIDOCAINE HCL (PF) 1 % IJ SOLN
INTRAMUSCULAR | Status: AC
  Filled 2021-04-01: qty 60

## 2021-04-01 MED ORDER — PROPOFOL 500 MG/50ML IV EMUL
INTRAVENOUS | Status: AC
  Filled 2021-04-01: qty 50

## 2021-04-01 MED ORDER — PHENYLEPHRINE HCL (PRESSORS) 10 MG/ML IV SOLN
INTRAVENOUS | Status: AC
  Filled 2021-04-01: qty 1

## 2021-04-01 MED ORDER — MIDAZOLAM HCL 5 MG/5ML IJ SOLN
INTRAMUSCULAR | Status: DC | PRN
  Administered 2021-04-01: 2 mg via INTRAVENOUS

## 2021-04-01 MED ORDER — LIDOCAINE HCL (PF) 2 % IJ SOLN
INTRAMUSCULAR | Status: AC
  Filled 2021-04-01: qty 5

## 2021-04-01 MED ORDER — ACETAMINOPHEN 500 MG PO TABS
1000.0000 mg | ORAL_TABLET | ORAL | Status: AC
  Administered 2021-04-01: 1000 mg via ORAL

## 2021-04-01 MED ORDER — ACETAMINOPHEN 500 MG PO TABS
ORAL_TABLET | ORAL | Status: AC
  Filled 2021-04-01: qty 2

## 2021-04-01 MED ORDER — PHENYLEPHRINE HCL (PRESSORS) 10 MG/ML IV SOLN
INTRAVENOUS | Status: DC | PRN
  Administered 2021-04-01: 80 ug via INTRAVENOUS

## 2021-04-01 SURGICAL SUPPLY — 93 items
ADH SKN CLS APL DERMABOND .7 (GAUZE/BANDAGES/DRESSINGS)
APL PRP STRL LF DISP 70% ISPRP (MISCELLANEOUS) ×2
BAG DECANTER FOR FLEXI CONT (MISCELLANEOUS) ×2 IMPLANT
BINDER ABDOMINAL 10 UNV 27-48 (MISCELLANEOUS) ×2 IMPLANT
BINDER ABDOMINAL 12 SM 30-45 (SOFTGOODS) IMPLANT
BINDER BREAST 3XL (GAUZE/BANDAGES/DRESSINGS) IMPLANT
BINDER BREAST LRG (GAUZE/BANDAGES/DRESSINGS) IMPLANT
BINDER BREAST MEDIUM (GAUZE/BANDAGES/DRESSINGS) IMPLANT
BINDER BREAST XLRG (GAUZE/BANDAGES/DRESSINGS) ×2 IMPLANT
BINDER BREAST XXLRG (GAUZE/BANDAGES/DRESSINGS) IMPLANT
BLADE SURG 10 STRL SS (BLADE) ×4 IMPLANT
BLADE SURG 11 STRL SS (BLADE) ×2 IMPLANT
BNDG GAUZE ELAST 4 BULKY (GAUZE/BANDAGES/DRESSINGS) ×4 IMPLANT
CANISTER LIPO FAT HARVEST (MISCELLANEOUS) ×2 IMPLANT
CANISTER SUCT 1200ML W/VALVE (MISCELLANEOUS) ×2 IMPLANT
CHLORAPREP W/TINT 26 (MISCELLANEOUS) ×4 IMPLANT
COVER BACK TABLE 60X90IN (DRAPES) ×2 IMPLANT
COVER MAYO STAND STRL (DRAPES) ×4 IMPLANT
DECANTER SPIKE VIAL GLASS SM (MISCELLANEOUS) IMPLANT
DERMABOND ADVANCED (GAUZE/BANDAGES/DRESSINGS)
DERMABOND ADVANCED .7 DNX12 (GAUZE/BANDAGES/DRESSINGS) IMPLANT
DRAIN CHANNEL 15F RND FF W/TCR (WOUND CARE) IMPLANT
DRAPE TOP ARMCOVERS (MISCELLANEOUS) ×2 IMPLANT
DRAPE U-SHAPE 76X120 STRL (DRAPES) ×2 IMPLANT
DRAPE UTILITY XL STRL (DRAPES) ×2 IMPLANT
DRSG PAD ABDOMINAL 8X10 ST (GAUZE/BANDAGES/DRESSINGS) ×4 IMPLANT
ELECT BLADE 4.0 EZ CLEAN MEGAD (MISCELLANEOUS)
ELECT COATED BLADE 2.86 ST (ELECTRODE) ×2 IMPLANT
ELECT REM PT RETURN 9FT ADLT (ELECTROSURGICAL) ×2
ELECTRODE BLDE 4.0 EZ CLN MEGD (MISCELLANEOUS) IMPLANT
ELECTRODE REM PT RTRN 9FT ADLT (ELECTROSURGICAL) ×1 IMPLANT
EVACUATOR SILICONE 100CC (DRAIN) IMPLANT
EXTRACTOR CANIST REVOLVE STRL (CANNISTER) IMPLANT
GLOVE SURG POLY MICRO LF SZ5.5 (GLOVE) ×4 IMPLANT
GLOVE SURG POLYISO LF SZ6.5 (GLOVE) ×6 IMPLANT
GLOVE SURG UNDER POLY LF SZ6.5 (GLOVE) ×2 IMPLANT
GLOVE SURG UNDER POLY LF SZ7 (GLOVE) ×2 IMPLANT
GOWN STRL REUS W/ TWL LRG LVL3 (GOWN DISPOSABLE) ×4 IMPLANT
GOWN STRL REUS W/TWL LRG LVL3 (GOWN DISPOSABLE) ×8
IMPL BREAST P6.2XRND LO 525 (Breast) ×1 IMPLANT
IMPL BREAST P6XRND 420 (Breast) ×1 IMPLANT
IMPL BRST P6.2XRND LO 525CC (Breast) ×1 IMPLANT
IMPL BRST P6XRND 420CC (Breast) ×1 IMPLANT
IMPLANT BREAST GEL 445CC (Breast) ×2 IMPLANT
IMPLANT BREAST GEL 525CC (Breast) ×2 IMPLANT
KIT FILL SYSTEM UNIVERSAL (SET/KITS/TRAYS/PACK) IMPLANT
LINER CANISTER 1000CC FLEX (MISCELLANEOUS) ×2 IMPLANT
MARKER SKIN DUAL TIP RULER LAB (MISCELLANEOUS) IMPLANT
NDL SAFETY ECLIPSE 18X1.5 (NEEDLE) ×1 IMPLANT
NEEDLE FILTER BLUNT 18X 1/2SAF (NEEDLE) ×1
NEEDLE FILTER BLUNT 18X1 1/2 (NEEDLE) ×1 IMPLANT
NEEDLE HYPO 18GX1.5 SHARP (NEEDLE) ×2
NEEDLE HYPO 25X1 1.5 SAFETY (NEEDLE) IMPLANT
NS IRRIG 1000ML POUR BTL (IV SOLUTION) ×2 IMPLANT
PACK BASIN DAY SURGERY FS (CUSTOM PROCEDURE TRAY) ×2 IMPLANT
PAD ALCOHOL SWAB (MISCELLANEOUS) ×2 IMPLANT
PENCIL SMOKE EVACUATOR (MISCELLANEOUS) ×2 IMPLANT
PIN SAFETY STERILE (MISCELLANEOUS) IMPLANT
SHEET MEDIUM DRAPE 40X70 STRL (DRAPES) ×4 IMPLANT
SIZER BREAST REUSE 445CC (SIZER) ×2
SIZER BREAST REUSE 470CC (SIZER) ×2
SIZER BREAST REUSE 495CC (SIZER) ×2
SIZER BREAST REUSE GEL 525CC (SIZER) ×2
SIZER BRST REUSE 445CC (SIZER) ×1 IMPLANT
SIZER BRST REUSE 470CC (SIZER) ×1 IMPLANT
SIZER BRST REUSE 495CC (SIZER) ×1 IMPLANT
SIZER BRST REUSE GEL 525CC (SIZER) ×1 IMPLANT
SLEEVE SCD COMPRESS KNEE MED (STOCKING) ×2 IMPLANT
SPONGE T-LAP 18X18 ~~LOC~~+RFID (SPONGE) ×4 IMPLANT
STAPLER VISISTAT 35W (STAPLE) ×2 IMPLANT
STRIP CLOSURE SKIN 1/2X4 (GAUZE/BANDAGES/DRESSINGS) ×4 IMPLANT
SUT ETHILON 2 0 FS 18 (SUTURE) IMPLANT
SUT MNCRL AB 4-0 PS2 18 (SUTURE) ×4 IMPLANT
SUT PDS AB 2-0 CT2 27 (SUTURE) IMPLANT
SUT VIC AB 3-0 PS1 18 (SUTURE)
SUT VIC AB 3-0 PS1 18XBRD (SUTURE) IMPLANT
SUT VIC AB 3-0 SH 27 (SUTURE) ×4
SUT VIC AB 3-0 SH 27X BRD (SUTURE) ×2 IMPLANT
SUT VICRYL 4-0 PS2 18IN ABS (SUTURE) ×4 IMPLANT
SYR 10ML LL (SYRINGE) ×6 IMPLANT
SYR 20ML LL LF (SYRINGE) IMPLANT
SYR 50ML LL SCALE MARK (SYRINGE) ×6 IMPLANT
SYR BULB IRRIG 60ML STRL (SYRINGE) ×4 IMPLANT
SYR CONTROL 10ML LL (SYRINGE) IMPLANT
SYR TB 1ML LL NO SAFETY (SYRINGE) ×2 IMPLANT
SYR TOOMEY IRRIG 70ML (MISCELLANEOUS)
SYRINGE TOOMEY IRRIG 70ML (MISCELLANEOUS) IMPLANT
TOWEL GREEN STERILE FF (TOWEL DISPOSABLE) ×4 IMPLANT
TUBE CONNECTING 20X1/4 (TUBING) ×2 IMPLANT
TUBING INFILTRATION IT-10001 (TUBING) ×2 IMPLANT
TUBING SET GRADUATE ASPIR 12FT (MISCELLANEOUS) ×2 IMPLANT
UNDERPAD 30X36 HEAVY ABSORB (UNDERPADS AND DIAPERS) ×4 IMPLANT
YANKAUER SUCT BULB TIP NO VENT (SUCTIONS) ×2 IMPLANT

## 2021-04-01 NOTE — Op Note (Signed)
Operative Note   DATE OF OPERATION: 9.20.22  LOCATION: [facility]-outpatient  SURGICAL DIVISION: Plastic Surgery  PREOPERATIVE DIAGNOSES:  1. History breast cancer 2. Acquired absence breasts 3. History therapeutic radiation  POSTOPERATIVE DIAGNOSES:  same  PROCEDURE:  1. Removal bilateral chest tissue expanders and placement silicone implants 2. Lipofilling to bilateral chest 100 ml  SURGEON: Irene Limbo MD MBA  ASSISTANT: none  ANESTHESIA:  General.   EBL: 25 ml  COMPLICATIONS: None immediate.   INDICATIONS FOR PROCEDURE:  The patient, Chelsea Hobbs, is a 50 y.o. female born on [birth year], is here for staged breast reconstruction following bilateral mastectomies, left latissimus flap.    FINDINGS: Complete incorporation ADM noted over right chest. 60 ml fat infiltrated over right chest, 40 ml fat infiltrated over left chest. Natrelle Soft Touch implants, smooth round Extra Projection placed bilateral. RIGHT 525 ml REF SSX-525 [id number] LEFT 445 ml REF SSX-445 [id number]  DESCRIPTION OF PROCEDURE:  The patient's operative site was marked including supra umbilical abdomen, bilateral flanks for liposuction. The patient was taken to the operating room. SCDs were placed and IV antibiotics were given. The patient's operative site was prepped and draped in a sterile fashion. A time out was performed and all information was confirmed to be correct. I began on left side. Incision made through prior inframammary fold scar and carried through superficial fascia and latissimus muscle. Expander removed. Superior medial capsulotomy performed. Sizer placed.   I then directed attention to right chest. Incision made in prior vertical mastectomy scar and carried through superficial fascia and acellular dermis. Expander removed and well incorporated ADM noted. Sizer placed. Patient brought to upright sitting position. Natrelle Smooth Round Extra Projection 525 ml implant selected for  right chest and 445 ml for left chest placement. Patient returned to supine position.   Stab incision made over bilateral abdomen. Tumescent fluid infiltrated over supra umbilical abdomen and bilateral flanks, total 250 ml tumescent infiltrated. Power assisted liposuction performed to endpoint symmetric contour and soft tissue thickness, total lipoaspirate 200 ml. The fat was then washed and prepared by gravity for infiltration. Harvested fat was then infiltrated in subcutaneous plane throughout total envelope bilateral mastectomy flaps.    Each cavity irrigated with saline solution containing Betadine, Ancef, gentamicin solution. Hemostasis ensured.The implant was placed in right chest and implant orientation ensured. Closure completed with 3-0 vicryl to close superficial fascia and ADM over implant. 4-0 vicryl used to close dermis followed by 4-0 monocryl subcuticular. Implant placed in left chest cavity. Closure completed with 3-0 vicryl to close superficial fascia and latissimus muscle over implant. 4-0 vicryl used to close dermis followed by 4-0 monocryl subcuticular skin closure. Abdomen incisions approximated with simple 4-0 monocryl stitch. Steri strips applied to chest incisions. Dry dressing applied, followed by breast binder and abdominal binder.   The patient was allowed to wake from anesthesia, extubated and taken to the recovery room in satisfactory condition.   SPECIMENS: none  DRAINS: none

## 2021-04-01 NOTE — Transfer of Care (Signed)
Immediate Anesthesia Transfer of Care Note  Patient: Chelsea Hobbs  Procedure(s) Performed: REMOVAL OF BILATERAL TISSUE EXPANDERS WITH PLACEMENT OF BILATERAL BREAST SILICONE IMPLANTS (Bilateral: Chest) LIPOFILLING BILATERAL CHEST (Bilateral: Chest)  Patient Location: PACU  Anesthesia Type:General  Level of Consciousness: awake, alert , oriented, drowsy and patient cooperative  Airway & Oxygen Therapy: Patient Spontanous Breathing and Patient connected to face mask oxygen  Post-op Assessment: Report given to RN and Post -op Vital signs reviewed and stable  Post vital signs: Reviewed and stable  Last Vitals:  Vitals Value Taken Time  BP    Temp    Pulse 83 04/01/21 1003  Resp 20 04/01/21 1003  SpO2 96 % 04/01/21 1003  Vitals shown include unvalidated device data.  Last Pain:  Vitals:   04/01/21 0629  TempSrc: Oral  PainSc: 0-No pain      Patients Stated Pain Goal: 3 (38/88/75 [id number])  Complications: No notable events documented.

## 2021-04-01 NOTE — Discharge Instructions (Signed)
No tylenol/ibuprofen until [place] Instructions  Activity: Get plenty of rest for the remainder of the day. A responsible individual must stay with you for 24 hours following the procedure.  For the next 24 hours, DO NOT: -Drive a car -Paediatric nurse -Drink alcoholic beverages -Take any medication unless instructed by your physician -Make any legal decisions or sign important papers.  Meals: Start with liquid foods such as gelatin or soup. Progress to regular foods as tolerated. Avoid greasy, spicy, heavy foods. If nausea and/or vomiting occur, drink only clear liquids until the nausea and/or vomiting subsides. Call your physician if vomiting continues.  Special Instructions/Symptoms: Your throat may feel dry or sore from the anesthesia or the breathing tube placed in your throat during surgery. If this causes discomfort, gargle with warm salt water. The discomfort should disappear within 24 hours.  If you had a scopolamine patch placed behind your ear for the management of post- operative nausea and/or vomiting:  1. The medication in the patch is effective for 72 hours, after which it should be removed.  Wrap patch in a tissue and discard in the trash. Wash hands thoroughly with soap and water. 2. You may remove the patch earlier than 72 hours if you experience unpleasant side effects which may include dry mouth, dizziness or visual disturbances. 3. Avoid touching the patch. Wash your hands with soap and water after contact with the patch.

## 2021-04-01 NOTE — H&P (Signed)
Subjective:   Patient ID: Chelsea Hobbs is a 50 y.o. female.  HPI  3 months post op. Presents for implant exchange.   Screening MMG fall 2020 showed possible left breast mass. MMG/US with mass left breast 10 o'clock, 3cmfn measuring 12x7x28m. Mass in the left 5:30 o'clock, 5cmfn, 9x6x767m, suspect fibroadenoma. 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 MRI showed 0.5 cm mass in the RIGHT breast. 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 0/1 SLN. RIGHT breast biopsy with intraductal papilloma, fibroadenoma (s/p excision at time of port removal 04/2020). Completed adjuvant chemotherapy Jan 2021. Completed adjuvant RT LEFT chest 09/16/2019.  Screening MRI 10/2020 demonstrated 2 x 3 cm area of NME within the RIGHT lower breast. Biopsy clip artifact from remote breast biopsy in 2008 lies anterior to NME. No abnormal appearing LN. 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 Unable to complete prognostic panel.  Patient elected to proceed with bilateral mastectomies. 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 Genetics with VUS CTNNA1. On anastrozole.  Prior to lumpectomy 34 B. Right mastectomy 364 g Left 458 g  PMH includes colitis and uses mesalamine suppositories.  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   Review of Systems   Objective:  Physical Exam  Cardiovascular: Normal rate, regular rhythm and normal heart sounds.  Pulmonary/Chest: Effort normal and breath sounds normal.   Chest: Bilateral chest expanded Left chest T junction scab in place no drainage  Abd soft healing incision at mons and umbilicus, no hernias, striae over  infraumbilical abdomen   Assessment:   Left breast ca UIQ ER+ s/p lumpectomy, SLN Adjuvant chemotherapy, RT Right breast DCIS S/p bilateral mastectomies, bilateral breast reconstruction with TE, right chest ADM (Alloderm), left LD flap  Plan:   Plan removal bilateral chest TEs, placement silicone implants, possible lipofilling chest.   Reviewed saline vs silicone, shaped vs round.  HCG or capacity filled silicone implants may offer reduced risk visible rippling.  Reviewed MRI or [place] for rupture with silicone implants. Reviewed examples for 4th generation, capacity filled 4th generation, and HCG implants vs saline implants. Reviewed risks AP flipping that may be more noticeable with 5th generation implants, may require surgery to correct. Reviewed textured vs smooth, former associated with ALCL risk. Reviewed size in part guided by width chest, cannot assure her cup size. Patient has elected for silicone. Plan smooth round.   Reviewed possible fat grafting. Reviewed purpose of this to thicken flaps limit visible rippling and aid in contour. Reviewed donor site pain, need for compression, variable take graft, fat necrosis that presents as masses and may require work up. She desires to proceed. Plan bilateral flanks and supraumbilical abdomen as donor site. Recommend she purchase Spanx type garment for post op use.  Reviewed OP surgery, no drains anticipated, use IMF scars alone. Additional risks including but not limited bleeding seroma hematoma infection wound healing problems damage to adjacent structures, need for additional procedures, blood clots in legs or lungs reviewed.  Completed physician patient checklist for Natrelle Inspira implants.  Rx for oxycodone (states had after BSO and no rash), Bactrim and robaxin. Plan no Dermabond as had reaction to this.  Natrelle  tissue expanders placed bilateral.  RIGHT 133S FV-12 T 400 ml tissue expander placed initial fill volume 400 ml  saline LEFT 133S MX-12-400 ml tissue expander placed fill volume 300 ml saline

## 2021-04-01 NOTE — Anesthesia Procedure Notes (Signed)
Procedure Name: Intubation Date/Time: 04/01/2021 7:36 AM Performed by: Willa Frater, CRNA Pre-anesthesia Checklist: Patient identified, Emergency Drugs available, Suction available and Patient being monitored Patient Re-evaluated:Patient Re-evaluated prior to induction Oxygen Delivery Method: Circle system utilized Preoxygenation: Pre-oxygenation with 100% oxygen Induction Type: IV induction Ventilation: Mask ventilation without difficulty Laryngoscope Size: Mac and 3 Grade View: Grade I Tube type: Oral Number of attempts: 1 Airway Equipment and Method: Stylet and Oral airway Placement Confirmation: ETT inserted through vocal cords under direct vision, positive ETCO2 and breath sounds checked- equal and bilateral Secured at: 22 cm Tube secured with: Tape Dental Injury: Teeth and Oropharynx as per pre-operative assessment

## 2021-04-01 NOTE — Anesthesia Postprocedure Evaluation (Signed)
Anesthesia Post Note  Patient: Chelsea Hobbs  Procedure(s) Performed: REMOVAL OF BILATERAL TISSUE EXPANDERS WITH PLACEMENT OF BILATERAL BREAST SILICONE IMPLANTS (Bilateral: Chest) LIPOFILLING BILATERAL CHEST (Bilateral: Chest)     Patient location during evaluation: PACU Anesthesia Type: General Level of consciousness: awake and alert Pain management: pain level controlled Vital Signs Assessment: post-procedure vital signs reviewed and stable Respiratory status: spontaneous breathing, nonlabored ventilation, respiratory function stable and patient connected to nasal cannula oxygen Cardiovascular status: blood pressure returned to baseline and stable Postop Assessment: no apparent nausea or vomiting Anesthetic complications: no   No notable events documented.  Last Vitals:  Vitals:   04/01/21 1039 04/01/21 1045  BP:  112/78  Pulse: 79 83  Resp: 17 20  Temp:    SpO2: 95% 94%    Last Pain:  Vitals:   04/01/21 1039  TempSrc:   PainSc: [place]

## 2021-04-01 NOTE — Addendum Note (Signed)
Addendum  created 04/01/21 1256 by Willa Frater, CRNA   Charge Capture section accepted

## 2021-04-07 ENCOUNTER — Encounter (HOSPITAL_BASED_OUTPATIENT_CLINIC_OR_DEPARTMENT_OTHER): Payer: Self-pay | Admitting: Plastic Surgery

## 2021-04-14 ENCOUNTER — Encounter: Payer: Self-pay | Admitting: Hematology

## 2021-04-14 ENCOUNTER — Inpatient Hospital Stay: Payer: BC Managed Care – PPO

## 2021-04-14 ENCOUNTER — Other Ambulatory Visit: Payer: Self-pay

## 2021-04-14 ENCOUNTER — Inpatient Hospital Stay: Payer: BC Managed Care – PPO | Attending: Hematology | Admitting: Hematology

## 2021-04-14 VITALS — BP 116/87 | HR 81 | Temp 97.9°F | Resp 18 | Ht 62.0 in | Wt 148.3 lb

## 2021-04-14 DIAGNOSIS — Z79811 Long term (current) use of aromatase inhibitors: Secondary | ICD-10-CM | POA: Insufficient documentation

## 2021-04-14 DIAGNOSIS — C50212 Malignant neoplasm of upper-inner quadrant of left female breast: Secondary | ICD-10-CM | POA: Diagnosis not present

## 2021-04-14 DIAGNOSIS — Z9221 Personal history of antineoplastic chemotherapy: Secondary | ICD-10-CM | POA: Insufficient documentation

## 2021-04-14 DIAGNOSIS — Z170 Estrogen receptor positive status [ER+]: Secondary | ICD-10-CM | POA: Insufficient documentation

## 2021-04-14 DIAGNOSIS — Z9013 Acquired absence of bilateral breasts and nipples: Secondary | ICD-10-CM | POA: Insufficient documentation

## 2021-04-14 DIAGNOSIS — Z853 Personal history of malignant neoplasm of breast: Secondary | ICD-10-CM | POA: Insufficient documentation

## 2021-04-14 DIAGNOSIS — D0511 Intraductal carcinoma in situ of right breast: Secondary | ICD-10-CM | POA: Insufficient documentation

## 2021-04-14 DIAGNOSIS — Z923 Personal history of irradiation: Secondary | ICD-10-CM | POA: Diagnosis not present

## 2021-04-14 LAB — CBC WITH DIFFERENTIAL (CANCER CENTER ONLY)
Abs Immature Granulocytes: 0.02 10*3/uL (ref 0.00–0.07)
Basophils Absolute: 0.1 10*3/uL (ref 0.0–0.1)
Basophils Relative: 1 %
Eosinophils Absolute: 0.4 10*3/uL (ref 0.0–0.5)
Eosinophils Relative: 6 %
HCT: 35.2 % — ABNORMAL LOW (ref 36.0–46.0)
Hemoglobin: 12 g/dL (ref 12.0–15.0)
Immature Granulocytes: 0 %
Lymphocytes Relative: 35 %
Lymphs Abs: 2.4 10*3/uL (ref 0.7–4.0)
MCH: 30.4 pg (ref 26.0–34.0)
MCHC: 34.1 g/dL (ref 30.0–36.0)
MCV: 89.1 fL (ref 80.0–100.0)
Monocytes Absolute: 0.5 10*3/uL (ref 0.1–1.0)
Monocytes Relative: 7 %
Neutro Abs: 3.5 10*3/uL (ref 1.7–7.7)
Neutrophils Relative %: 51 %
Platelet Count: 347 10*3/uL (ref 150–400)
RBC: 3.95 MIL/uL (ref 3.87–5.11)
RDW: 12.4 % (ref 11.5–15.5)
WBC Count: 6.9 10*3/uL (ref 4.0–10.5)
nRBC: 0 % (ref 0.0–0.2)

## 2021-04-14 LAB — CMP (CANCER CENTER ONLY)
ALT: 25 U/L (ref 0–44)
AST: 17 U/L (ref 15–41)
Albumin: 4.1 g/dL (ref 3.5–5.0)
Alkaline Phosphatase: 64 U/L (ref 38–126)
Anion gap: 10 (ref 5–15)
BUN: 14 mg/dL (ref 6–20)
CO2: 26 mmol/L (ref 22–32)
Calcium: 9 mg/dL (ref 8.9–10.3)
Chloride: 104 mmol/L (ref 98–111)
Creatinine: 0.72 mg/dL (ref 0.44–1.00)
GFR, Estimated: >60 mL/min (ref >60)
Glucose, Bld: 99 mg/dL (ref 70–99)
Potassium: 4.4 mmol/L (ref 3.5–5.1)
Sodium: 140 mmol/L (ref 135–145)
Total Bilirubin: 0.3 mg/dL (ref 0.3–1.2)
Total Protein: 7 g/dL (ref 6.5–8.1)

## 2021-04-14 MED ORDER — EXEMESTANE 25 MG PO TABS: 25.0000 mg | ORAL_TABLET | Freq: Every day | ORAL | 3 refills | Status: DC

## 2021-04-14 NOTE — Progress Notes (Signed)
[place]   [phone] [phone]   Clinic Follow up Note   Patient Care Team: Wenda Low, MD as PCP - General (Internal Medicine) Rockwell Germany, RN as Oncology Nurse Navigator Mauro Kaufmann, RN as Oncology Nurse Navigator Erroll Luna, MD as Consulting Physician (General Surgery) Truitt Merle, MD as Consulting Physician (Hematology) Gery Pray, MD as Consulting Physician (Radiation Oncology) Alla Feeling, NP as Nurse Practitioner (Nurse Practitioner)  Date of Service:  04/14/2021  CHIEF COMPLAINT: f/u of left breast cancer  CURRENT THERAPY:  Zometa every 6 months starting 11/27/19 Tamoxifen 71m daily starting 10/12/19. Switched to Anastrozole in 09/2020  ASSESSMENT & PLAN:  Chelsea LEVITANis a 50y.o. female with   1. Right breast DCIS, ER/PR+ -She was found to have a 2 x 3 cm area of non-mass-like enhancement in the lower right breast by screening MRI 10/18/20 -Biopsy on 10/31/20 showed low-grade DCIS and atypical lobular hyperplasia. Insufficient material for ER/PR testing. -Given her prior breast cancer and breast surgeries, she opted to proceed with bilateral mastectomies on 12/26/20 under Dr. CBrantley Stage Tissue expanders were also placed at that time by Dr. TIran Planas Pathology showed: focal DCIS, low to intermediate grade, with calcifications; lobular neoplasia. ER and PR both strongly positive. Left breast and right axillary lymph nodes were negative. -she completed reconstruction on 04/01/21 under Dr. TIran Planas -She is on anastrozole, which she tolerates moderate well with mild joint pains. I reviewed that we can switch to exemestane if her joint pain worsens. She would like to switch. I discussed she can wean off the effexor in a few months. I will call her in 3 months to see how she is doing. -We reviewed surveillance. She does not need mammograms. If she notices any change, we can do ultrasound. We will plan for bone density  screening in a few years and monitor on AI. -labs reviewed, CBC WNL, CMP pending. We will fax the results to her PCP, so she can avoid repeat blood draw. -f/u in 7 months   2. Malignant neoplasm of upper-inner quadrant of left breast, Stage IA, p(T1cN0M0), ER+/PR+, HER2+, Grade III -She was diagnosed in 03/2019. She underwent left breast lumpectomy and SLNB on 04/04/19. Her DCIS margins were close (124m.  -She completed adjuvant Taxol/Herceptin including maintenance Herceptin and adjuvant Radiation. -Given her ER/PR positive disease and premenopausal status, I started her on Tamoxifen in 10/2019. Tolerating well with some anxiety and hot flashes. -She underwent right  Breast lumpectomy to remove her benign fibroadenoma on 04/17/20.  -She had BSO on 02/11/21. Pathology was benign, as expected.  3. Bone Health -she began Zometa on 11/27/19 and has received 3 doses. She will return next month for her 4th dose.     PLAN:  -finish anastrozole and switch to exemestane. I prescribed today. -lab and Zometa in 4-6 weeks -phone visit in 3 months -lab and f/u in 7 months with next Zometa dose. -she plan to wean off Effexor in 2-3 months    No problem-specific Assessment & Plan notes found for this encounter.   SUMMARY OF ONCOLOGIC HISTORY: Oncology History Overview Note  Cancer Staging Malignant neoplasm of upper-inner quadrant of left breast in female, estrogen receptor positive ([place]aging form: Breast, AJCC 8th Edition - Clinical stage from 03/22/2019: Stage IA (cT1c, cN0, cM0, G3, ER+, PR+, HER2+) - Signed by FeTruitt MerleMD on 03/22/2019 Stage prefix: Initial diagnosis Histologic grading system: 3 grade system - Pathologic stage from 04/04/2019: Stage IA (  pT1c, pN0, cM0, G3, ER+, PR+, HER2+) - Signed by Truitt Merle, MD on 04/21/2019 Stage prefix: Initial diagnosis Multigene prognostic tests performed: None Histologic grading system: 3 grade system Residual tumor (R): R0 - None - Pathologic: No  stage assigned - Unsigned    Malignant neoplasm of upper-inner quadrant of left breast in female, estrogen receptor positive ([place])  03/10/2019 Mammogram   Diagnostic Mammogram 03/10/19  IMPRESSION: 1. Suspicious mass in the 10 o'clock position of the left breast, 3cm from the nipple measuring 12x7x25m. Biopsy is indicated. 2. Indeterminate mass in the 5:30 o'clock position of the left breast, 5cm from the nipple, measuring 9x6x740m Although this may reflect a fibroadenoma, tissue sampling is recommended.   03/16/2019 Initial Biopsy   Diagnosis 03/16/19  1. 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 SEE NOTE 2. Breast, left, needle core biopsy, 5:30 o'clock - FIBROADENOMA   03/16/2019 Receptors her2   Results: IMMUNOHISTOCHEMICAL AND MORPHOMETRIC ANALYSIS PERFORMED MANUALLY The tumor cells are POSITIVE for Her2 (3+). Estrogen Receptor: 100%, POSITIVE, STRONG STAINING INTENSITY Progesterone Receptor: 100%, POSITIVE, STRONG STAINING INTENSITY Proliferation Marker Ki67: 60%   03/21/2019 Initial Diagnosis   Malignant neoplasm of upper-inner quadrant of left breast in female, estrogen receptor positive ([place]  03/22/2019 Cancer Staging   Staging form: Breast, AJCC 8th Edition - Clinical stage from 03/22/2019: Stage IA (cT1c, cN0, cM0, G3, ER+, PR+, HER2+) - Signed by FeTruitt MerleMD on 03/22/2019   03/31/2019 Genetic Testing   VUS in CTNNA1 called [id number] (Intronic) was identified on the [employer] Common Hereditary Cancers Panel. The Common Hereditary Cancers Panel offered by [employer] includes sequencing and/or deletion duplication testing of the following 47 genes: APC, ATM, AXIN2, BARD1, BMPR1A, BRCA1, BRCA2, BRIP1, CDH1, CDKN2A (p14ARF), CDKN2A (p16INK4a), CKD4, CHEK2, CTNNA1, DICER1, EPCAM (Deletion/duplication testing only), GREM1 (promoter region deletion/duplication testing only), KIT, MEN1, MLH1, MSH2, MSH3, MSH6, MUTYH, NBN, NF1, NHTL1, PALB2, PDGFRA,  PMS2, POLD1, POLE, PTEN, RAD50, RAD51C, RAD51D, SDHB, SDHC, SDHD, SMAD4, SMARCA4. STK11, TP53, TSC1, TSC2, and VHL.  The following genes were evaluated for sequence changes only: SDHA and HOXB13 c.251G>A variant only. The report date is 03/30/2019.    03/31/2019 Breast MRI   MRI breast 03/31/19  IMPRESSION: 1. 5 millimeter enhancing oval mass in the anteromedial aspect of the RIGHT breast warranting further evaluation. Given its anterior depth and visibility on noncontrast images, ultrasound is recommended to determine if there is a sonographic correlate. If no sonographic correlate is identified to guide biopsy, MRI biopsy of the RIGHT breast is recommended. 2. Known malignancy in the UPPER INNER QUADRANT of the LEFT breast measuring 1.0 centimeters. 3. 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   04/04/2019 Surgery   RADIOCATIVE SEED GUIDED LEFT BREAST LUMPECTOMY, LEFT AXILLARY SENTINEL LYMPH NODE BIOPSY and PAC placement  By Dr. CoBrantley Stage9/22/20    04/04/2019 Pathology Results   DIAGNOSIS: 04/04/19  A. BREAST, LEFT, LUMPECTOMY:  -  Invasive ductal carcinoma, Nottingham grade 3 of 3, 1.3 cm  -  Ductal carcinoma in-situ, intermediate grade  -  Margins uninvolved by carcinoma (0.1 cm; posterior margin; see  comment)  -  Previous biopsy site changes present  -  See oncology table and comment below   B. BREAST, LEFT, ADDITIONAL MEDIAL MARGIN, EXCISION:  -  Fibrocystic changes  -  No residual carcinoma identified   C. LYMPH NODE, LEFT AXILLARY, SENTINEL, BIOPSY:  -   No carcinoma identified in one  lymph node (0/1)    04/04/2019 Cancer Staging   Staging form: Breast, AJCC 8th Edition - Pathologic stage from 04/04/2019: Stage IA (pT1c, pN0, cM0, G3, ER+, PR+, HER2+) - Signed by Truitt Merle, MD on 04/21/2019   04/11/2019 Pathology Results   Diagnosis Breast, right, needle core biopsy, 2 o'clock - SMALL INTRADUCTAL PAPILLOMA. SEE NOTE - SMALL  FIBROADENOMA - NEGATIVE FOR CARCINOMA   05/12/2019 - 06/10/2020 Chemotherapy   Adjuvant Weekly Taxol and Herceptin for 12 weeks starting 05/12/19, switched to Abraxane on 08/04/19 due to skin rash. Completed on 08/11/19.  Followed by maintenance Herceptin q3weeks to complete 1 year treatment (from 05/12/19) starting 09/01/19-06/10/20   08/29/2019 - 09/26/2019 Radiation Therapy   Adjuvant radiation per Dr. Sondra Come    10/12/2019 -  Anti-estrogen oral therapy   Tamoxifen 91m daily starting 10/12/19   11/27/2019 Survivorship   SCP delivered by LCira Rue NP    03/15/2020 Mammogram   IMPRESSION: 1. Expected surgical changes at the lumpectomy site in the central posterior left breast.   2. No suspicious changes at the site of the biopsied papilloma in the lower inner retroareolar right breast (ribbon clip). Surgical excision is planned for this lesion.   3.  No evidence of malignancy in the bilateral breasts.   04/17/2020 Surgery   RIGHT BREAST LUMPECTOMY WITH RADIOACTIVE SEED LOCALIZATION and PAC removal by Dr CBrantley Stage  FINAL MICROSCOPIC DIAGNOSIS:   A. BREAST, RIGHT, LUMPECTOMY:  - Biopsy clip.  Fibroadenoma.  Fibroadenomatoid change.  Fibrocystic  change.        INTERVAL HISTORY:  KSavaya Hobbs is here for a follow up of breast cancer. She was last seen by me on 01/17/21. She presents to the clinic alone. She reports she has had a lot done since her last visit. She notes she is trying to recover her energy following her two surgeries. She notes she gained weight from not moving, and possibly from anastrozole. She reports joint pain from anastrozole. She notes this improves as she gets moving.   All other systems were reviewed with the patient and are negative.  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, legs, and feet   Family history of breast cancer    Family history of lung cancer    History of  2019 novel coronavirus disease (COVID-19) 07/17/2019   positive result in care everywhere, mild symptoms that resolved   History of colitis    followed by dr kLinna Darner(GI)--- per pt dx age 6260s, residual scarring, take lialda daily   History of right breast cancer 10/2020   followed by oncology---  04-17-2020 s/p right breast lumpectomy, fibroadema;  dx 04/ 2022 w/ DCIS right breast  s/p right mastectomy with node bx 12-26-2020 dx DCIS stage 0 (no chemo/ radiation)   Malignant neoplasm of upper-inner quadrant of left breast in female, estrogen receptor positive ([place] 03/2019   oncologist--- dr y. fBurr Medico  dx left breast IDC w/ DCIS, Stage IA,  04-04-2019 s/p left breast lumpectomy with node bx;  completed chemo 06-10-2020 and radiation 09-26-2019;  ;  12-26-2020 s/p bilateral  mastectomy with reconstruction with right node bx;  right breast DCIS dx 04/ 2022   Mild asthma    followed by pcp   Personal history of chemotherapy    05-12-2019  to 06-10-2020, left breast cancer   Personal history of radiation therapy    08-29-2019  to  09-26-2019,  left breast cancer    SURGICAL HISTORY: Past Surgical History:  Procedure Laterality Date   BREAST LUMPECTOMY Left 04/04/2019   BREAST LUMPECTOMY WITH RADIOACTIVE SEED AND SENTINEL LYMPH NODE BIOPSY Left 04/04/2019   Procedure: RADIOCATIVE SEED GUIDED LEFT BREAST LUMPECTOMY, LEFT AXILLARY SENTINEL LYMPH NODE BIOPSY;  Surgeon: Erroll Luna, MD;  Location: [facility];  Service: General;  Laterality: Left;   BREAST LUMPECTOMY WITH RADIOACTIVE SEED LOCALIZATION Right 04/17/2020   Procedure: RIGHT BREAST LUMPECTOMY WITH RADIOACTIVE SEED LOCALIZATION;  Surgeon: Erroll Luna, MD;  Location: [facility];  Service: General;  Laterality: Right;   BREAST RECONSTRUCTION WITH PLACEMENT OF TISSUE EXPANDER AND ALLODERM Right 12/26/2020   Procedure: RIGHT BREAST RECONSTRUCTION WITH PLACEMENT OF TISSUE EXPANDER AND ALLODERM;  Surgeon:  Irene Limbo, MD;  Location: [place];  Service: Plastics;  Laterality: Right;   COLONOSCOPY  2021   LAPAROSCOPIC SALPINGO OOPHERECTOMY Bilateral 02/11/2021   Procedure: LAPAROSCOPIC BILATERAL SALPINGO OOPHORECTOMY;  Surgeon: [name], MD;  [facility];  Service: Gynecology;  Laterality: Bilateral;   LATISSIMUS FLAP TO BREAST Left 12/26/2020   Procedure: LEFT BREAST RECONSTRUCTION WITH LATISSIMUS FLAP AND PLACEMENT OF TISSUE EXPANDER;  Surgeon: Irene Limbo, MD;  Location: [facility];  Service: Plastics;  Laterality: Left;   LIPOSUCTION WITH LIPOFILLING Bilateral 04/01/2021   Procedure: LIPOFILLING BILATERAL CHEST;  Surgeon: Irene Limbo, MD;  Location: [place];  Service: Plastics;  Laterality: Bilateral;   NASAL SINUS SURGERY Bilateral 2009   NIPPLE SPARING MASTECTOMY Bilateral 12/26/2020   Procedure: BILATERAL SKIN SPARING MASTECTOMIES;  Surgeon: Erroll Luna, MD;  Location: [place];  Service: General;  Laterality: Bilateral;   PORT-A-CATH REMOVAL Right 04/17/2020   Procedure: REMOVAL PORT-A-CATH;  Surgeon: Erroll Luna, MD;  Location: [place];  Service: General;  Laterality: Right;   PORTACATH PLACEMENT N/A 04/04/2019   Procedure: INSERTION PORT-A-CATH WITH ULTRASOUND;  Surgeon: Erroll Luna, MD;  Location: [place];  Service: General;  Laterality: N/A;   REMOVAL OF BILATERAL TISSUE EXPANDERS WITH PLACEMENT OF BILATERAL BREAST IMPLANTS Bilateral 04/01/2021   Procedure: REMOVAL OF BILATERAL TISSUE EXPANDERS WITH PLACEMENT OF BILATERAL BREAST SILICONE IMPLANTS;  Surgeon: Irene Limbo, MD;  Location: [place];  Service: Plastics;  Laterality: Bilateral;   SENTINEL NODE BIOPSY Right 12/26/2020   Procedure: RIGHT AXILLARY SENTINEL LYMPH NODE BIOPSY;  Surgeon: Erroll Luna, MD;  Location: [place];  Service: General;  Laterality: Right;   WISDOM TOOTH EXTRACTION Bilateral      I have reviewed the social history and family history with the patient and they are unchanged from previous note.  ALLERGIES:  is allergic to gadolinium derivatives, latex, tetracycline hcl, and wound dressing adhesive.  MEDICATIONS:  Current Outpatient Medications  Medication Sig Dispense Refill   exemestane (AROMASIN) 25 MG tablet Take 1 tablet (25 mg total) by mouth daily after breakfast. 30 tablet 3   albuterol (PROVENTIL HFA;VENTOLIN HFA) 108 (90 BASE) MCG/ACT inhaler Inhale 2 puffs into the lungs every 6 (six) hours as needed for shortness of breath.     ALPRAZolam (XANAX) 0.5 MG tablet Take 0.25 mg by mouth 2 (two) times daily as needed for anxiety.     Ascorbic Acid (VITAMIN C) 1000 MG tablet Take 2,000 mg by mouth daily. Lysine     Calcium-Magnesium-Vitamin D (CALCIUM MAGNESIUM PO) Take 1,200 mg by mouth daily.     Cholecalciferol (VITAMIN D) 125 MCG (5000 UT) CAPS Take 5,000 Units by mouth  daily.     docusate sodium (COLACE) 100 MG capsule Take 1 capsule (100 mg total) by mouth 2 (two) times daily. 60 capsule 2   fexofenadine (ALLEGRA) 180 MG tablet Take 180 mg by mouth daily as needed for allergies.     fluticasone (FLONASE) 50 MCG/ACT nasal spray Place 1 spray into both nostrils daily as needed for allergies.     gabapentin (NEURONTIN) 100 MG capsule Take 2 capsules (200 mg total) by mouth at bedtime. 60 capsule 6   ibuprofen (ADVIL) 600 MG tablet Take 1 tablet (600 mg total) by mouth every 6 (six) hours as needed. 30 tablet 0   mesalamine (LIALDA) 1.2 g EC tablet Take 1.2 g by mouth at bedtime.     methocarbamol (ROBAXIN) 500 MG tablet Take 500 mg by mouth 3 (three) times daily as needed.     montelukast (SINGULAIR) 10 MG tablet Take 1 tablet by mouth at bedtime.     Multiple Vitamins-Minerals (MULTIVITAMIN WITH MINERALS) tablet Take 1 tablet by mouth daily.     Omega-3 1000 MG CAPS Take 1,000 mg by mouth daily.     oxyCODONE (OXY IR/ROXICODONE) 5 MG immediate release tablet  Take 1 tablet (5 mg total) by mouth every 4 (four) hours as needed for severe pain. 15 tablet 0   SUMAtriptan (IMITREX) 100 MG tablet Take 100 mg by mouth every 2 (two) hours as needed for migraine.     venlafaxine XR (EFFEXOR-XR) 37.5 MG 24 hr capsule TAKE 1 CAPSULE BY MOUTH DAILY WITH BREAKFAST. (Patient taking differently: Take 37.5 mg by mouth daily with breakfast.) 90 capsule 1   Zinc 25 MG TABS Take 25 mg by mouth daily.     No current facility-administered medications for this visit.    PHYSICAL EXAMINATION: ECOG PERFORMANCE STATUS: 0 - Asymptomatic  Vitals:   04/14/21 0859  BP: 116/87  Pulse: 81  Resp: 18  Temp: 97.9 F (36.6 C)  SpO2: 99%   Wt Readings from Last 3 Encounters:  04/14/21 148 lb 4.8 oz (67.3 kg)  04/01/21 146 lb 6.2 oz (66.4 kg)  02/11/21 144 lb (65.3 kg)     GENERAL:alert, no distress and comfortable SKIN: skin color, texture, turgor are normal, no rashes or significant lesions EYES: normal, Conjunctiva are pink and non-injected, sclera clear  NECK: supple, thyroid normal size, non-tender, without nodularity LYMPH:  no palpable lymphadenopathy in the cervical, axillary  LUNGS: clear to auscultation and percussion with normal breathing effort HEART: regular rate & rhythm and no murmurs and no lower extremity edema ABDOMEN:abdomen soft, non-tender and normal bowel sounds Musculoskeletal:no cyanosis of digits and no clubbing  NEURO: alert & oriented x 3 with fluent speech, no focal motor/sensory deficits BREAST: No palpable mass, nodules or adenopathy bilaterally. Breast exam benign.   LABORATORY DATA:  I have reviewed the data as listed CBC Latest Ref Rng & Units 04/14/2021 02/07/2021 12/20/2020  WBC 4.0 - 10.5 K/uL 6.9 7.0 6.9  Hemoglobin 12.0 - 15.0 g/dL 12.0 12.9 13.1  Hematocrit 36.0 - 46.0 % 35.2(L) 38.3 39.5  Platelets 150 - 400 K/uL 347 347 346     CMP Latest Ref Rng & Units 04/14/2021 12/20/2020 11/18/2020  Glucose 70 - 99 mg/dL 99 90 84  BUN 6  - 20 mg/dL _0 Creatinine 0.44 - 1.00 mg/dL 0.72 0.57 0.67  Sodium 135 - 145 mmol/L 140 139 145  Potassium 3.5 - 5.1 mmol/L 4.4 3.9 4.1  Chloride 98 - 111 mmol/L 104  105 108  CO2 22 - 32 mmol/L _0 Calcium 8.9 - 10.3 mg/dL 9.0 9.6 8.7(L)  Total Protein 6.5 - 8.1 g/dL 7.0 7.2 6.7  Total Bilirubin 0.3 - 1.2 mg/dL 0.3 0.8 0.2(L)  Alkaline Phos 38 - 126 U/L 64 52 46  AST 15 - 41 U/L 17 34 28  ALT 0 - 44 U/L 25 50(H) 27      RADIOGRAPHIC STUDIES: I have personally reviewed the radiological images as listed and agreed with the findings in the report. No results found.    No orders of the defined types were placed in this encounter.  All questions were answered. The patient knows to call the clinic with any problems, questions or concerns. No barriers to learning was detected. The total time spent in the appointment was 30 minutes.     Truitt Merle, MD 04/14/2021   I, Wilburn Mylar, am acting as scribe for Truitt Merle, MD.   I have reviewed the above documentation for accuracy and completeness, and I agree with the above.

## 2021-04-15 DIAGNOSIS — Z13220 Encounter for screening for lipoid disorders: Secondary | ICD-10-CM | POA: Diagnosis not present

## 2021-04-15 DIAGNOSIS — Z0000 Encounter for general adult medical examination without abnormal findings: Secondary | ICD-10-CM | POA: Diagnosis not present

## 2021-04-15 DIAGNOSIS — Z23 Encounter for immunization: Secondary | ICD-10-CM | POA: Diagnosis not present

## 2021-04-28 ENCOUNTER — Encounter: Payer: Self-pay | Admitting: Rehabilitation

## 2021-04-28 ENCOUNTER — Other Ambulatory Visit: Payer: Self-pay

## 2021-04-28 ENCOUNTER — Ambulatory Visit: Payer: BC Managed Care – PPO | Attending: Plastic Surgery | Admitting: Rehabilitation

## 2021-04-28 DIAGNOSIS — M6281 Muscle weakness (generalized): Secondary | ICD-10-CM | POA: Diagnosis not present

## 2021-04-28 DIAGNOSIS — Z483 Aftercare following surgery for neoplasm: Secondary | ICD-10-CM | POA: Insufficient documentation

## 2021-04-28 DIAGNOSIS — R208 Other disturbances of skin sensation: Secondary | ICD-10-CM | POA: Diagnosis not present

## 2021-04-28 DIAGNOSIS — R293 Abnormal posture: Secondary | ICD-10-CM | POA: Insufficient documentation

## 2021-04-28 DIAGNOSIS — M25612 Stiffness of left shoulder, not elsewhere classified: Secondary | ICD-10-CM | POA: Diagnosis not present

## 2021-04-28 DIAGNOSIS — Z170 Estrogen receptor positive status [ER+]: Secondary | ICD-10-CM | POA: Insufficient documentation

## 2021-04-28 DIAGNOSIS — C50212 Malignant neoplasm of upper-inner quadrant of left female breast: Secondary | ICD-10-CM | POA: Insufficient documentation

## 2021-04-28 NOTE — Therapy (Signed)
[facility] @ [place], [place], [address] Phone: [phone]   Fax:  [phone]  Physical Therapy Treatment  Patient Details  Name: Chelsea Hobbs MRN: [id number] Date of Birth: [birth year] Referring Provider (PT): Dr. Iran Planas   Encounter Date: 04/28/2021   PT End of Session - 04/28/21 1056     Visit Number 3    Number of Visits 17    Date for PT Re-Evaluation 06/09/21    Authorization - Visit Number 1    Authorization - Number of Visits 30    PT Start Time 1010    PT Stop Time 1050    PT Time Calculation (min) 40 min    Activity Tolerance Patient tolerated treatment well    Behavior During Therapy [facility] for tasks assessed/performed             Past Medical History:  Diagnosis Date   Chemotherapy-induced neuropathy ([place])    per pt effects hands, legs, and feet   Family history of breast cancer    Family history of lung cancer    History of 2019 novel coronavirus disease (COVID-19) 07/17/2019   positive result in care everywhere, mild symptoms that resolved   History of colitis    followed by dr Linna Darner (GI)--- per pt dx age 30s , residual scarring, take lialda daily   History of right breast cancer 10/2020   followed by oncology---  04-17-2020 s/p right breast lumpectomy, fibroadema;  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 Burr Medico;  dx left breast IDC w/ DCIS, Stage IA,  04-04-2019 s/p left breast lumpectomy with node bx;  completed chemo 06-10-2020 and radiation 09-26-2019;  ;  12-26-2020 s/p bilateral  mastectomy with reconstruction with right node bx;  right breast DCIS dx 04/ 2022   Mild asthma    followed by pcp   Personal history of chemotherapy    05-12-2019  to 06-10-2020, left  breast cancer   Personal history of radiation therapy    08-29-2019  to  09-26-2019,  left breast cancer    Past Surgical History:  Procedure Laterality Date   BREAST LUMPECTOMY Left 04/04/2019   BREAST LUMPECTOMY WITH RADIOACTIVE SEED AND SENTINEL LYMPH NODE BIOPSY Left 04/04/2019   Procedure: RADIOCATIVE SEED GUIDED LEFT BREAST LUMPECTOMY, LEFT AXILLARY SENTINEL LYMPH NODE BIOPSY;  Surgeon: Erroll Luna, MD;  Location: [place];  Service: General;  Laterality: Left;   BREAST LUMPECTOMY WITH RADIOACTIVE SEED LOCALIZATION Right 04/17/2020   Procedure: RIGHT BREAST LUMPECTOMY WITH RADIOACTIVE SEED LOCALIZATION;  Surgeon: Erroll Luna, MD;  Location: [facility];  Service: General;  Laterality: Right;   BREAST RECONSTRUCTION WITH PLACEMENT OF TISSUE EXPANDER AND ALLODERM Right 12/26/2020   Procedure: RIGHT BREAST RECONSTRUCTION WITH PLACEMENT OF TISSUE EXPANDER AND ALLODERM;  Surgeon: Irene Limbo, MD;  Location: [place];  Service: Plastics;  Laterality: Right;   COLONOSCOPY  2021   LAPAROSCOPIC SALPINGO OOPHERECTOMY Bilateral 02/11/2021   Procedure: LAPAROSCOPIC BILATERAL SALPINGO OOPHORECTOMY;  Surgeon: [name], MD;  Location: [facility];  Service: Gynecology;  Laterality: Bilateral;   LATISSIMUS FLAP TO BREAST Left 12/26/2020   Procedure: LEFT BREAST RECONSTRUCTION WITH LATISSIMUS FLAP AND PLACEMENT OF TISSUE EXPANDER;  Surgeon: Irene Limbo, MD;  Location:  [facility];  Service: Plastics;  Laterality: Left;   LIPOSUCTION WITH LIPOFILLING Bilateral 04/01/2021   Procedure: LIPOFILLING BILATERAL CHEST;  Surgeon: Irene Limbo, MD;  Location: [facility];  Service: Plastics;  Laterality: Bilateral;   NASAL SINUS SURGERY Bilateral 2009   NIPPLE SPARING MASTECTOMY Bilateral 12/26/2020   Procedure: BILATERAL SKIN SPARING MASTECTOMIES;  Surgeon: Erroll Luna, MD;  Location: [facility];  Service: General;  Laterality:  Bilateral;   PORT-A-CATH REMOVAL Right 04/17/2020   Procedure: REMOVAL PORT-A-CATH;  Surgeon: Erroll Luna, MD;  Location: [facility];  Service: General;  Laterality: Right;   PORTACATH PLACEMENT N/A 04/04/2019   Procedure: INSERTION PORT-A-CATH WITH ULTRASOUND;  Surgeon: Erroll Luna, MD;  Location: [place];  Service: General;  Laterality: N/A;   REMOVAL OF BILATERAL TISSUE EXPANDERS WITH PLACEMENT OF BILATERAL BREAST IMPLANTS Bilateral 04/01/2021   Procedure: REMOVAL OF BILATERAL TISSUE EXPANDERS WITH PLACEMENT OF BILATERAL BREAST SILICONE IMPLANTS;  Surgeon: Irene Limbo, MD;  Location: [place];  Service: Plastics;  Laterality: Bilateral;   SENTINEL NODE BIOPSY Right 12/26/2020   Procedure: RIGHT AXILLARY SENTINEL LYMPH NODE BIOPSY;  Surgeon: Erroll Luna, MD;  Location: [place];  Service: General;  Laterality: Right;   WISDOM TOOTH EXTRACTION Bilateral     There were no vitals filed for this visit.   Subjective Assessment - 04/28/21 1011     Subjective I have lost alot of muscle tone.  I feel like I don't have the flexibility in the shoulders.  Rt>Lt. Goes back to work Wednesday    Pertinent History DCIS right breast pt underwent bilateral mastectomy on 12/26/2020 with 0/3 nodes on right and lat flap on left with reentry into previous axillary node incision, bilateral expanders placed 04/01/21 with liposuction fat fill from abdomen.  Ovaries and fallopian tubes removed on 02/11/2021. 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 Patient reported she underwent a left lumpectomy and sentinel node biopsy on 04/04/2019 with 1 negative axillary node removed with a Ki67 of 60%. Pt completed radiation and chemo.    Limitations Lifting    Currently in Pain? No/denies                [facility] PT Assessment - 04/28/21 0001       Assessment    Medical Diagnosis left breast cancer    Referring Provider (PT) Dr. Iran Planas    Onset Date/Surgical Date 12/26/20    Hand Dominance Right    Prior Therapy yes      Precautions   Precaution Comments at risk for lymphedema      Restrictions   Weight Bearing Restrictions No      Balance Screen   Has the patient fallen in the past 6 months No    Has the patient had a decrease in activity level because of a fear of falling?  No    Is the patient reluctant to leave their home because of a fear of falling?  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    Leisure walking and lower body stretches      Observation/Other Assessments   Observations some  yellow breast incision slough on the left side but seems to be healing well and not at risk of opening      AROM   Right Shoulder Extension 60 Degrees    Right Shoulder Flexion 160 Degrees   tight latissimus   Right Shoulder ABduction 165 Degrees   painful arc to 90deg back of shoulder   Right Shoulder Internal Rotation 70 Degrees    Right Shoulder External Rotation 80 Degrees   pull back of arm   Left Shoulder Extension 50 Degrees    Left Shoulder Flexion 160 Degrees    Left Shoulder ABduction 165 Degrees   pectoralis pull   Left Shoulder Internal Rotation 70 Degrees    Left Shoulder External Rotation 90 Degrees   pectoralis pull                          [facility] Adult PT Treatment/Exercise - 04/28/21 0001       Exercises   Exercises Other Exercises    Other Exercises  gave pt turning point 4 week stretches to include LTR with goalpost arms, alternating overhead flexion, XYW avoiding pinch, and added scapular retractions.  Educated on avoiding Rt sided impingement like pinch  and pt will get the okay on the healing of the Lt side at her appt today.                     PT Education - 04/28/21 1056     Education Details updated HEP    Person(s) Educated Patient    Methods Explanation;Demonstration;Handout;Verbal cues    Comprehension Verbalized understanding;Returned demonstration;Verbal cues required              PT Short Term Goals - 01/29/21 2020       PT SHORT TERM GOAL #1   Title Pt will be independent in a home exercise program for general strength in preparation for return to work    Time 4    Period Weeks    Status New               PT Long Term Goals - 04/28/21 1101       PT LONG TERM GOAL #1   Title Pt will have 160 of right and left shoulder aduction without pain so that she can return to normal activities    Time 6    Period Weeks    Status On-going      PT LONG TERM GOAL #2   Title Pt will demonstrate no pinching in the Rt shoulder with ER    Time 6    Period Weeks    Status New      PT LONG TERM GOAL #3   Title Pt will be independent in a home exercise program for ongoing fitness    Status On-going      PT LONG TERM GOAL #4   Title Pt will be able to return to work without limitations    Time 6    Period Weeks    Status New                   Plan - 04/28/21 1058     Clinical Impression Statement Pt returns after implant switch with impingement like pain in the Rt shoulder with ER and abduction and doing very well on the left side.  Pt is getting used to the larger size of the chest and overall has had  no decrease in her AROM since last visit 2 months ago.  Pt will be going to Dr. Iran Planas today to get the final clearance for activity and stretching but was given HEP to start today as everything looks good.  Pt would like to focus on improving strength and decreasing the pinch on the Rt side for return to work.    Personal Factors and Comorbidities Comorbidity 3+    Comorbidities previous  surgery and radiation on left side , lymph nodes removed from both sides, previous chemo    PT Frequency 2x / week    PT Duration 6 weeks    PT Treatment/Interventions ADLs/Self Care Home Management;Therapeutic exercise;Patient/family education;Manual techniques;Taping;Electrical Stimulation;Neuromuscular re-education;Balance training;DME Instruction;Passive range of motion    PT Next Visit Plan all good from Dr. Darene Lamer?  review stretches, may like foam roll, postural stregthening    Consulted and Agree with Plan of Care Patient             Patient will benefit from skilled therapeutic intervention in order to improve the following deficits and impairments:  Postural dysfunction, Pain, Decreased skin integrity, Increased muscle spasms, Impaired UE functional use, Increased fascial restricitons, Decreased strength, Decreased knowledge of use of DME, Decreased activity tolerance, Decreased scar mobility, Decreased range of motion, Decreased knowledge of precautions  Visit Diagnosis: Aftercare following surgery for neoplasm  Stiffness of left shoulder, not elsewhere classified  Abnormal posture  Other disturbances of skin sensation  Muscle weakness (generalized)     Problem List Patient Active Problem List   Diagnosis Date Noted   History of breast cancer 12/26/2020   Skin rash 04/29/2020   Port-A-Cath in place 06/02/2019   Intraductal papilloma of breast, right 04/21/2019   Genetic testing 03/31/2019   Family history of breast cancer    Family history of lung cancer    Malignant neoplasm of upper-inner quadrant of left breast in female, estrogen receptor positive ([place]) 03/21/2019   NASAL POLYP 09/22/2007   ALLERGIC RHINITIS 09/22/2007   ASTHMA 09/22/2007   PERSONAL HISTORY OF ALLERGY TO LATEX 09/22/2007    [place] Bray, PT 04/28/2021, 11:02 AM  [facility] @ [place], [place], [address] Phone:  [phone]   Fax:  [phone]  Name: Chelsea Hobbs MRN: [id number] Date of Birth: [birth year]

## 2021-05-02 ENCOUNTER — Other Ambulatory Visit: Payer: Self-pay

## 2021-05-02 ENCOUNTER — Ambulatory Visit: Payer: BC Managed Care – PPO | Admitting: Rehabilitation

## 2021-05-02 ENCOUNTER — Encounter: Payer: Self-pay | Admitting: Rehabilitation

## 2021-05-02 DIAGNOSIS — M6281 Muscle weakness (generalized): Secondary | ICD-10-CM | POA: Diagnosis not present

## 2021-05-02 DIAGNOSIS — Z483 Aftercare following surgery for neoplasm: Secondary | ICD-10-CM | POA: Diagnosis not present

## 2021-05-02 DIAGNOSIS — R208 Other disturbances of skin sensation: Secondary | ICD-10-CM

## 2021-05-02 DIAGNOSIS — Z170 Estrogen receptor positive status [ER+]: Secondary | ICD-10-CM

## 2021-05-02 DIAGNOSIS — C50212 Malignant neoplasm of upper-inner quadrant of left female breast: Secondary | ICD-10-CM | POA: Diagnosis not present

## 2021-05-02 DIAGNOSIS — R293 Abnormal posture: Secondary | ICD-10-CM | POA: Diagnosis not present

## 2021-05-02 DIAGNOSIS — M25612 Stiffness of left shoulder, not elsewhere classified: Secondary | ICD-10-CM

## 2021-05-02 NOTE — Therapy (Signed)
[facility] @ [place], [place], [address] Phone: [phone]   Fax:  [phone]  Physical Therapy Treatment  Patient Details  Name: Chelsea Hobbs MRN: [id number] Date of Birth: 1970-11-28 Referring Provider (PT): Dr. Iran Planas   Encounter Date: 05/02/2021   PT End of Session - 05/02/21 0937     Visit Number 4    Number of Visits 17    Date for PT Re-Evaluation 06/09/21    Authorization - Visit Number 2    Authorization - Number of Visits 30    PT Start Time 0803    PT Stop Time 1779    PT Time Calculation (min) 50 min    Activity Tolerance Patient tolerated treatment well    Behavior During Therapy [facility] for tasks assessed/performed             Past Medical History:  Diagnosis Date   Chemotherapy-induced neuropathy ([place])    per pt effects hands, legs, and feet   Family history of breast cancer    Family history of lung cancer    History of 2019 novel coronavirus disease (COVID-19) 07/17/2019   positive result in care everywhere, mild symptoms that resolved   History of colitis    followed by dr Linna Darner (GI)--- per pt dx age 70s , residual scarring, take lialda daily   History of right breast cancer 10/2020   followed by oncology---  04-17-2020 s/p right breast lumpectomy, fibroadema;  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 Burr Medico;  dx left breast IDC w/ DCIS, Stage IA,  04-04-2019 s/p left breast lumpectomy with node bx;  completed chemo 06-10-2020 and radiation 09-26-2019;  ;  12-26-2020 s/p bilateral  mastectomy with reconstruction with right node bx;  right breast DCIS dx 04/ 2022   Mild asthma    followed by pcp   Personal history of chemotherapy    05-12-2019  to 06-10-2020, left  breast cancer   Personal history of radiation therapy    08-29-2019  to  09-26-2019,  left breast cancer    Past Surgical History:  Procedure Laterality Date   BREAST LUMPECTOMY Left 04/04/2019   BREAST LUMPECTOMY WITH RADIOACTIVE SEED AND SENTINEL LYMPH NODE BIOPSY Left 04/04/2019   Procedure: RADIOCATIVE SEED GUIDED LEFT BREAST LUMPECTOMY, LEFT AXILLARY SENTINEL LYMPH NODE BIOPSY;  Surgeon: Erroll Luna, MD;  Location: [place];  Service: General;  Laterality: Left;   BREAST LUMPECTOMY WITH RADIOACTIVE SEED LOCALIZATION Right 04/17/2020   Procedure: RIGHT BREAST LUMPECTOMY WITH RADIOACTIVE SEED LOCALIZATION;  Surgeon: Erroll Luna, MD;  Location: [facility];  Service: General;  Laterality: Right;   BREAST RECONSTRUCTION WITH PLACEMENT OF TISSUE EXPANDER AND ALLODERM Right 12/26/2020   Procedure: RIGHT BREAST RECONSTRUCTION WITH PLACEMENT OF TISSUE EXPANDER AND ALLODERM;  Surgeon: Irene Limbo, MD;  Location: [place];  Service: Plastics;  Laterality: Right;   COLONOSCOPY  2021   LAPAROSCOPIC SALPINGO OOPHERECTOMY Bilateral 02/11/2021   Procedure: LAPAROSCOPIC BILATERAL SALPINGO OOPHORECTOMY;  Surgeon: [name], MD;  Location: [facility];  Service: Gynecology;  Laterality: Bilateral;   LATISSIMUS FLAP TO BREAST Left 12/26/2020   Procedure: LEFT BREAST RECONSTRUCTION WITH LATISSIMUS FLAP AND PLACEMENT OF TISSUE EXPANDER;  Surgeon: Irene Limbo, MD;  Location:  [place] [facility];  Service: Plastics;  Laterality: Left;   LIPOSUCTION WITH LIPOFILLING Bilateral 04/01/2021   Procedure: LIPOFILLING BILATERAL CHEST;  Surgeon: Irene Limbo, MD;  Location: [place];  Service: Plastics;  Laterality: Bilateral;   NASAL SINUS SURGERY Bilateral 2009   NIPPLE SPARING MASTECTOMY Bilateral 12/26/2020   Procedure: BILATERAL SKIN SPARING MASTECTOMIES;  Surgeon: Erroll Luna, MD;  Location: [place];  Service: General;  Laterality:  Bilateral;   PORT-A-CATH REMOVAL Right 04/17/2020   Procedure: REMOVAL PORT-A-CATH;  Surgeon: Erroll Luna, MD;  Location: [place];  Service: General;  Laterality: Right;   PORTACATH PLACEMENT N/A 04/04/2019   Procedure: INSERTION PORT-A-CATH WITH ULTRASOUND;  Surgeon: Erroll Luna, MD;  Location: [place];  Service: General;  Laterality: N/A;   REMOVAL OF BILATERAL TISSUE EXPANDERS WITH PLACEMENT OF BILATERAL BREAST IMPLANTS Bilateral 04/01/2021   Procedure: REMOVAL OF BILATERAL TISSUE EXPANDERS WITH PLACEMENT OF BILATERAL BREAST SILICONE IMPLANTS;  Surgeon: Irene Limbo, MD;  Location: [place];  Service: Plastics;  Laterality: Bilateral;   SENTINEL NODE BIOPSY Right 12/26/2020   Procedure: RIGHT AXILLARY SENTINEL LYMPH NODE BIOPSY;  Surgeon: Erroll Luna, MD;  Location: [place];  Service: General;  Laterality: Right;   WISDOM TOOTH EXTRACTION Bilateral     There were no vitals filed for this visit.   Subjective Assessment - 05/02/21 0805     Subjective No restrictions. It was exhausting to go back to work    Pertinent History DCIS right breast pt underwent bilateral mastectomy on 12/26/2020 with 0/3 nodes on right and lat flap on left with reentry into previous axillary node incision, bilateral expanders placed 04/01/21 with liposuction fat fill from abdomen.  Ovaries and fallopian tubes removed on 02/11/2021. 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 Patient reported she underwent a left lumpectomy and sentinel node biopsy on 04/04/2019 with 1 negative axillary node removed with a Ki67 of 60%. Pt completed radiation and chemo.    Currently in Pain? No/denies                               [facility] Adult PT Treatment/Exercise - 05/02/21 0001       Shoulder Exercises: Supine   External Rotation AAROM    External  Rotation Limitations attempted with towel roll propping arm but still with pinch so stopped      Shoulder Exercises: Seated   Other Seated Exercises scapular elevation, depression, pro/ret      Shoulder Exercises: Pulleys   Flexion 2 minutes    Flexion Limitations with initial instruction    Scaption 2 minutes    Scaption Limitations avoiding Rt shoulder pinch      Manual Therapy   Manual Therapy Passive ROM;Joint mobilization;Scapular mobilization;Soft tissue mobilization    Manual therapy comments noted Rt UE shaking mild which pt notes has been happening since chemotherapy and she is unsure why.    Joint Mobilization GH AP grade IV 3x30" and 90/90 osciallations and PA glides    Soft tissue mobilization in supine to pectoralis, anterior shoulder and sidleying to periscapular muscles    Scapular Mobilization in sidelying all directions needing vcs to relax and allow movement    Passive ROM to bil shoulder with left doing well.  Focus on Rt into all directions avoiding pinch  PT Short Term Goals - 01/29/21 2020       PT SHORT TERM GOAL #1   Title Pt will be independent in a home exercise program for general strength in preparation for return to work    Time 4    Period Weeks    Status New               PT Long Term Goals - 04/28/21 1101       PT LONG TERM GOAL #1   Title Pt will have 160 of right and left shoulder aduction without pain so that she can return to normal activities    Time 6    Period Weeks    Status On-going      PT LONG TERM GOAL #2   Title Pt will demonstrate no pinching in the Rt shoulder with ER    Time 6    Period Weeks    Status New      PT LONG TERM GOAL #3   Title Pt will be independent in a home exercise program for ongoing fitness    Status On-going      PT LONG TERM GOAL #4   Title Pt will be able to return to work without limitations    Time 6    Period Weeks    Status New                    Plan - 05/02/21 7017     Clinical Impression Statement Focused on decreasing Rt impingement symptoms today with joint mob, scapular mob, and STM with overall guarding, tightness in the pectoralis and latissimus, and decreased scapular mobiility.  IMproved with vcs and MT.  Pt feeling less pinch with standing active ER post MT but still present.    PT Frequency 2x / week    PT Duration 6 weeks    PT Treatment/Interventions ADLs/Self Care Home Management;Therapeutic exercise;Patient/family education;Manual techniques;Taping;Electrical Stimulation;Neuromuscular re-education;Balance training;DME Instruction;Passive range of motion    PT Next Visit Plan cont Rt STM, GH and scapular mob for impingement, may like foam roll, postural stregthening    Consulted and Agree with Plan of Care Patient             Patient will benefit from skilled therapeutic intervention in order to improve the following deficits and impairments:     Visit Diagnosis: Aftercare following surgery for neoplasm  Stiffness of left shoulder, not elsewhere classified  Abnormal posture  Other disturbances of skin sensation  Muscle weakness (generalized)  Malignant neoplasm of upper-inner quadrant of left breast in female, estrogen receptor positive ([place])     Problem List Patient Active Problem List   Diagnosis Date Noted   History of breast cancer 12/26/2020   Skin rash 04/29/2020   Port-A-Cath in place 06/02/2019   Intraductal papilloma of breast, right 04/21/2019   Genetic testing 03/31/2019   Family history of breast cancer    Family history of lung cancer    Malignant neoplasm of upper-inner quadrant of left breast in female, estrogen receptor positive ([place]) 03/21/2019   NASAL POLYP 09/22/2007   ALLERGIC RHINITIS 09/22/2007   ASTHMA 09/22/2007   PERSONAL HISTORY OF ALLERGY TO LATEX 09/22/2007    Stark Bray, PT 05/02/2021, 9:40 AM  [facility] @ [place], [place], [address] Phone: [phone]   Fax:  [phone]  Name: Chelsea Hobbs MRN: [id number] Date of Birth: 1970-10-16

## 2021-05-05 ENCOUNTER — Ambulatory Visit: Payer: BC Managed Care – PPO | Admitting: Rehabilitation

## 2021-05-05 ENCOUNTER — Other Ambulatory Visit: Payer: Self-pay

## 2021-05-05 DIAGNOSIS — Z483 Aftercare following surgery for neoplasm: Secondary | ICD-10-CM

## 2021-05-05 DIAGNOSIS — R293 Abnormal posture: Secondary | ICD-10-CM | POA: Diagnosis not present

## 2021-05-05 DIAGNOSIS — M6281 Muscle weakness (generalized): Secondary | ICD-10-CM | POA: Diagnosis not present

## 2021-05-05 DIAGNOSIS — C50212 Malignant neoplasm of upper-inner quadrant of left female breast: Secondary | ICD-10-CM | POA: Diagnosis not present

## 2021-05-05 DIAGNOSIS — M25612 Stiffness of left shoulder, not elsewhere classified: Secondary | ICD-10-CM

## 2021-05-05 DIAGNOSIS — Z170 Estrogen receptor positive status [ER+]: Secondary | ICD-10-CM | POA: Diagnosis not present

## 2021-05-05 DIAGNOSIS — R208 Other disturbances of skin sensation: Secondary | ICD-10-CM | POA: Diagnosis not present

## 2021-05-05 NOTE — Therapy (Signed)
[facility] @ [place], [place], [address] Phone: [phone]   Fax:  [phone]  Physical Therapy Treatment  Patient Details  Name: RAYN SHORB MRN: [id number] Date of Birth: 1971-04-13 Referring Provider (PT): Dr. Iran Planas   Encounter Date: 05/05/2021   PT End of Session - 05/05/21 1149     Visit Number 5    Number of Visits 17    Date for PT Re-Evaluation 06/09/21    Authorization - Visit Number 3    Authorization - Number of Visits 30    PT Start Time 8676    PT Stop Time 1950    PT Time Calculation (min) 45 min    Activity Tolerance Patient tolerated treatment well    Behavior During Therapy [facility] for tasks assessed/performed             Past Medical History:  Diagnosis Date   Chemotherapy-induced neuropathy ([place])    per pt effects hands, legs, and feet   Family history of breast cancer    Family history of lung cancer    History of 2019 novel coronavirus disease (COVID-19) 07/17/2019   positive result in care everywhere, mild symptoms that resolved   History of colitis    followed by dr Linna Darner (GI)--- per pt dx age 40s , residual scarring, take lialda daily   History of right breast cancer 10/2020   followed by oncology---  04-17-2020 s/p right breast lumpectomy, fibroadema;  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 Burr Medico;  dx left breast IDC w/ DCIS, Stage IA,  04-04-2019 s/p left breast lumpectomy with node bx;  completed chemo 06-10-2020 and radiation 09-26-2019;  ;  12-26-2020 s/p bilateral  mastectomy with reconstruction with right node bx;  right breast DCIS dx 04/ 2022   Mild asthma    followed by pcp   Personal history of chemotherapy    05-12-2019  to 06-10-2020, left  breast cancer   Personal history of radiation therapy    08-29-2019  to  09-26-2019,  left breast cancer    Past Surgical History:  Procedure Laterality Date   BREAST LUMPECTOMY Left 04/04/2019   BREAST LUMPECTOMY WITH RADIOACTIVE SEED AND SENTINEL LYMPH NODE BIOPSY Left 04/04/2019   Procedure: RADIOCATIVE SEED GUIDED LEFT BREAST LUMPECTOMY, LEFT AXILLARY SENTINEL LYMPH NODE BIOPSY;  Surgeon: Erroll Luna, MD;  Location: [facility];  Service: General;  Laterality: Left;   BREAST LUMPECTOMY WITH RADIOACTIVE SEED LOCALIZATION Right 04/17/2020   Procedure: RIGHT BREAST LUMPECTOMY WITH RADIOACTIVE SEED LOCALIZATION;  Surgeon: Erroll Luna, MD;  Location: [facility];  Service: General;  Laterality: Right;   BREAST RECONSTRUCTION WITH PLACEMENT OF TISSUE EXPANDER AND ALLODERM Right 12/26/2020   Procedure: RIGHT BREAST RECONSTRUCTION WITH PLACEMENT OF TISSUE EXPANDER AND ALLODERM;  Surgeon: Irene Limbo, MD;  Location: [facility];  Service: Plastics;  Laterality: Right;   COLONOSCOPY  2021   LAPAROSCOPIC SALPINGO OOPHERECTOMY Bilateral 02/11/2021   Procedure: LAPAROSCOPIC BILATERAL SALPINGO OOPHORECTOMY;  Surgeon: [name], MD;  Location: [facility];  Service: Gynecology;  Laterality: Bilateral;   LATISSIMUS FLAP TO BREAST Left 12/26/2020   Procedure: LEFT BREAST RECONSTRUCTION WITH LATISSIMUS FLAP AND PLACEMENT OF TISSUE EXPANDER;  Surgeon: Irene Limbo, MD;  Location:  [facility];  Service: Plastics;  Laterality: Left;   LIPOSUCTION WITH LIPOFILLING Bilateral 04/01/2021   Procedure: LIPOFILLING BILATERAL CHEST;  Surgeon: Irene Limbo, MD;  Location: [facility];  Service: Plastics;  Laterality: Bilateral;   NASAL SINUS SURGERY Bilateral 2009   NIPPLE SPARING MASTECTOMY Bilateral 12/26/2020   Procedure: BILATERAL SKIN SPARING MASTECTOMIES;  Surgeon: Erroll Luna, MD;  Location: [facility];  Service: General;  Laterality:  Bilateral;   PORT-A-CATH REMOVAL Right 04/17/2020   Procedure: REMOVAL PORT-A-CATH;  Surgeon: Erroll Luna, MD;  Location: [facility];  Service: General;  Laterality: Right;   PORTACATH PLACEMENT N/A 04/04/2019   Procedure: INSERTION PORT-A-CATH WITH ULTRASOUND;  Surgeon: Erroll Luna, MD;  Location: [facility];  Service: General;  Laterality: N/A;   REMOVAL OF BILATERAL TISSUE EXPANDERS WITH PLACEMENT OF BILATERAL BREAST IMPLANTS Bilateral 04/01/2021   Procedure: REMOVAL OF BILATERAL TISSUE EXPANDERS WITH PLACEMENT OF BILATERAL BREAST SILICONE IMPLANTS;  Surgeon: Irene Limbo, MD;  Location: [facility];  Service: Plastics;  Laterality: Bilateral;   SENTINEL NODE BIOPSY Right 12/26/2020   Procedure: RIGHT AXILLARY SENTINEL LYMPH NODE BIOPSY;  Surgeon: Erroll Luna, MD;  Location: [facility];  Service: General;  Laterality: Right;   WISDOM TOOTH EXTRACTION Bilateral     There were no vitals filed for this visit.   Subjective Assessment - 05/05/21 1059     Subjective I have a little catch in the neck which happens with work    Pertinent History DCIS right breast pt underwent bilateral mastectomy on 12/26/2020 with 0/3 nodes on right and lat flap on left with reentry into previous axillary node incision, bilateral expanders placed 04/01/21 with liposuction fat fill from abdomen.  Ovaries and fallopian tubes removed on 02/11/2021. 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 Patient reported she underwent a left lumpectomy and sentinel node biopsy on 04/04/2019 with 1 negative axillary node removed with a Ki67 of 60%. Pt completed radiation and chemo.    Limitations Lifting                               [facility] Adult PT Treatment/Exercise - 05/05/21 0001       Shoulder Exercises: Supine   Protraction Both;10 reps    Horizontal  ABduction Both;10 reps    Theraband Level (Shoulder Horizontal ABduction) Level 1 (Yellow)    External Rotation Both;10 reps    Theraband Level (Shoulder External Rotation) Level 1 (Yellow)    External Rotation Limitations stopping before any pinch    Diagonals Both;10 reps    Theraband Level (Shoulder Diagonals) Level 1 (Yellow)    Diagonals Limitations avoiding any pinch      Shoulder Exercises: Pulleys   Flexion 2 minutes    Scaption 2 minutes      Shoulder Exercises: Therapy Ball   Flexion 5 reps      Manual Therapy   Joint Mobilization GH AP grade IV 3x30" and 90/90 osciallations and PA glides    Soft tissue mobilization in supine to pectoralis, anterior shoulder and sidleying to periscapular muscles    Passive ROM to bil shoulder with left doing well.  Focus on Rt into all directions avoiding pinch                       PT Short Term Goals - 01/29/21  2020       PT SHORT TERM GOAL #1   Title Pt will be independent in a home exercise program for general strength in preparation for return to work    Time 4    Period Weeks    Status New               PT Long Term Goals - 04/28/21 1101       PT LONG TERM GOAL #1   Title Pt will have 160 of right and left shoulder aduction without pain so that she can return to normal activities    Time 6    Period Weeks    Status On-going      PT LONG TERM GOAL #2   Title Pt will demonstrate no pinching in the Rt shoulder with ER    Time 6    Period Weeks    Status New      PT LONG TERM GOAL #3   Title Pt will be independent in a home exercise program for ongoing fitness    Status On-going      PT LONG TERM GOAL #4   Title Pt will be able to return to work without limitations    Time 6    Period Weeks    Status New                   Plan - 05/05/21 1149     Clinical Impression Statement less pinch today.  Only pinch noted was supine ER end range.  All other motions and TE was tolerated without  pinch.  Able to add supine scap today to HEP.    PT Frequency 2x / week    PT Duration 6 weeks    PT Treatment/Interventions ADLs/Self Care Home Management;Therapeutic exercise;Patient/family education;Manual techniques;Taping;Electrical Stimulation;Neuromuscular re-education;Balance training;DME Instruction;Passive range of motion    PT Next Visit Plan cont Rt STM, GH and scapular mob for impingement, may like foam roll, postural stregthening    Consulted and Agree with Plan of Care Patient             Patient will benefit from skilled therapeutic intervention in order to improve the following deficits and impairments:     Visit Diagnosis: Aftercare following surgery for neoplasm  Stiffness of left shoulder, not elsewhere classified  Abnormal posture     Problem List Patient Active Problem List   Diagnosis Date Noted   History of breast cancer 12/26/2020   Skin rash 04/29/2020   Port-A-Cath in place 06/02/2019   Intraductal papilloma of breast, right 04/21/2019   Genetic testing 03/31/2019   Family history of breast cancer    Family history of lung cancer    Malignant neoplasm of upper-inner quadrant of left breast in female, estrogen receptor positive ([place]) 03/21/2019   NASAL POLYP 09/22/2007   ALLERGIC RHINITIS 09/22/2007   ASTHMA 09/22/2007   PERSONAL HISTORY OF ALLERGY TO LATEX 09/22/2007    Stark Bray, PT 05/05/2021, 11:50 AM  [facility] @ [place], [place], [address] Phone: [phone]   Fax:  [phone]  Name: SHELLI PORTILLA MRN: [id number] Date of Birth: [birth year]

## 2021-05-05 NOTE — Patient Instructions (Signed)
Access Code: [email or web address] URL: [email or web address]: 10/24/2022Prepared by: Marcene Brawn TevisExercises  Supine Shoulder Horizontal Abduction with Resistance - 1 x daily - 3-4 x weekly - 1-3 sets - 10 reps - 2-3 second hold  Supine Shoulder External Rotation with Resistance - 1 x daily - 3-4 x weekly - 1-3 sets - 10 reps - 2-3 second hold  Supine PNF D2 Flexion with Resistance - 1 x daily - 3-4 x weekly - 1-3 sets - 10 reps - 2-3 second hold  Supine Bilateral Shoulder Protraction - 1 x daily - 7 x weekly - 1-3 sets - 10 reps - 2-3 seconds hold  Sidelying Shoulder External Rotation - 1 x daily - 7 x weekly - 1 sets - 10 reps - no hold

## 2021-05-06 DIAGNOSIS — Z01419 Encounter for gynecological examination (general) (routine) without abnormal findings: Secondary | ICD-10-CM | POA: Diagnosis not present

## 2021-05-06 DIAGNOSIS — Z6826 Body mass index (BMI) 26.0-26.9, adult: Secondary | ICD-10-CM | POA: Diagnosis not present

## 2021-05-06 DIAGNOSIS — Z13820 Encounter for screening for osteoporosis: Secondary | ICD-10-CM | POA: Diagnosis not present

## 2021-05-12 ENCOUNTER — Ambulatory Visit: Payer: BC Managed Care – PPO

## 2021-05-13 ENCOUNTER — Other Ambulatory Visit: Payer: Self-pay

## 2021-05-13 ENCOUNTER — Encounter: Payer: Self-pay | Admitting: Rehabilitation

## 2021-05-13 ENCOUNTER — Ambulatory Visit: Payer: BC Managed Care – PPO | Attending: Plastic Surgery | Admitting: Rehabilitation

## 2021-05-13 DIAGNOSIS — M25612 Stiffness of left shoulder, not elsewhere classified: Secondary | ICD-10-CM | POA: Insufficient documentation

## 2021-05-13 DIAGNOSIS — Z170 Estrogen receptor positive status [ER+]: Secondary | ICD-10-CM | POA: Insufficient documentation

## 2021-05-13 DIAGNOSIS — R208 Other disturbances of skin sensation: Secondary | ICD-10-CM | POA: Insufficient documentation

## 2021-05-13 DIAGNOSIS — Z483 Aftercare following surgery for neoplasm: Secondary | ICD-10-CM | POA: Insufficient documentation

## 2021-05-13 DIAGNOSIS — R293 Abnormal posture: Secondary | ICD-10-CM | POA: Diagnosis not present

## 2021-05-13 DIAGNOSIS — M6281 Muscle weakness (generalized): Secondary | ICD-10-CM | POA: Insufficient documentation

## 2021-05-13 DIAGNOSIS — C50212 Malignant neoplasm of upper-inner quadrant of left female breast: Secondary | ICD-10-CM | POA: Diagnosis not present

## 2021-05-13 NOTE — Therapy (Signed)
[facility] @ [place], [place], [address] Phone: [phone]   Fax:  [phone]  Physical Therapy Treatment  Patient Details  Name: Chelsea Hobbs MRN: [id number] Date of Birth: 31-Dec-1970 Referring Provider (PT): Dr. Iran Planas   Encounter Date: 05/13/2021   PT End of Session - 05/13/21 1059     Visit Number 6    Number of Visits 17    Date for PT Re-Evaluation 06/09/21    Authorization - Visit Number 6    Authorization - Number of Visits 30    PT Start Time 1009    PT Stop Time 1050    PT Time Calculation (min) 41 min    Activity Tolerance Patient tolerated treatment well    Behavior During Therapy [facility] for tasks assessed/performed             Past Medical History:  Diagnosis Date   Chemotherapy-induced neuropathy ([place])    per pt effects hands, legs, and feet   Family history of breast cancer    Family history of lung cancer    History of 2019 novel coronavirus disease (COVID-19) 07/17/2019   positive result in care everywhere, mild symptoms that resolved   History of colitis    followed by dr Linna Darner (GI)--- per pt dx age 33s , residual scarring, take lialda daily   History of right breast cancer 10/2020   followed by oncology---  04-17-2020 s/p right breast lumpectomy, fibroadema;  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 Burr Medico;  dx left breast IDC w/ DCIS, Stage IA,  04-04-2019 s/p left breast lumpectomy with node bx;  completed chemo 06-10-2020 and radiation 09-26-2019;  ;  12-26-2020 s/p bilateral  mastectomy with reconstruction with right node bx;  right breast DCIS dx 04/ 2022   Mild asthma    followed by pcp   Personal history of chemotherapy    05-12-2019  to 06-10-2020, left breast  cancer   Personal history of radiation therapy    08-29-2019  to  09-26-2019,  left breast cancer    Past Surgical History:  Procedure Laterality Date   BREAST LUMPECTOMY Left 04/04/2019   BREAST LUMPECTOMY WITH RADIOACTIVE SEED AND SENTINEL LYMPH NODE BIOPSY Left 04/04/2019   Procedure: RADIOCATIVE SEED GUIDED LEFT BREAST LUMPECTOMY, LEFT AXILLARY SENTINEL LYMPH NODE BIOPSY;  Surgeon: Erroll Luna, MD;  Location: [facility];  Service: General;  Laterality: Left;   BREAST LUMPECTOMY WITH RADIOACTIVE SEED LOCALIZATION Right 04/17/2020   Procedure: RIGHT BREAST LUMPECTOMY WITH RADIOACTIVE SEED LOCALIZATION;  Surgeon: Erroll Luna, MD;  Location: [facility];  Service: General;  Laterality: Right;   BREAST RECONSTRUCTION WITH PLACEMENT OF TISSUE EXPANDER AND ALLODERM Right 12/26/2020   Procedure: RIGHT BREAST RECONSTRUCTION WITH PLACEMENT OF TISSUE EXPANDER AND ALLODERM;  Surgeon: Irene Limbo, MD;  Location: [facility];  Service: Plastics;  Laterality: Right;   COLONOSCOPY  2021   LAPAROSCOPIC SALPINGO OOPHERECTOMY Bilateral 02/11/2021   Procedure: LAPAROSCOPIC BILATERAL SALPINGO OOPHORECTOMY;  Surgeon: [name], MD;  Location: [facility];  Service: Gynecology;  Laterality: Bilateral;   LATISSIMUS FLAP TO BREAST Left 12/26/2020   Procedure: LEFT BREAST RECONSTRUCTION WITH LATISSIMUS FLAP AND PLACEMENT OF TISSUE EXPANDER;  Surgeon: Irene Limbo, MD;  Location: [facility];  Service: Clinical cytogeneticist;  Laterality: Left;   LIPOSUCTION WITH LIPOFILLING Bilateral 04/01/2021   Procedure: LIPOFILLING BILATERAL CHEST;  Surgeon: Irene Limbo, MD;  Location: [facility];  Service: Plastics;  Laterality: Bilateral;   NASAL SINUS SURGERY Bilateral 2009   NIPPLE SPARING MASTECTOMY Bilateral 12/26/2020   Procedure: BILATERAL SKIN SPARING MASTECTOMIES;  Surgeon: Erroll Luna, MD;  Location: [facility];  Service: General;  Laterality:  Bilateral;   PORT-A-CATH REMOVAL Right 04/17/2020   Procedure: REMOVAL PORT-A-CATH;  Surgeon: Erroll Luna, MD;  Location: [facility];  Service: General;  Laterality: Right;   PORTACATH PLACEMENT N/A 04/04/2019   Procedure: INSERTION PORT-A-CATH WITH ULTRASOUND;  Surgeon: Erroll Luna, MD;  Location: [facility];  Service: General;  Laterality: N/A;   REMOVAL OF BILATERAL TISSUE EXPANDERS WITH PLACEMENT OF BILATERAL BREAST IMPLANTS Bilateral 04/01/2021   Procedure: REMOVAL OF BILATERAL TISSUE EXPANDERS WITH PLACEMENT OF BILATERAL BREAST SILICONE IMPLANTS;  Surgeon: Irene Limbo, MD;  Location: [facility];  Service: Plastics;  Laterality: Bilateral;   SENTINEL NODE BIOPSY Right 12/26/2020   Procedure: RIGHT AXILLARY SENTINEL LYMPH NODE BIOPSY;  Surgeon: Erroll Luna, MD;  Location: [facility];  Service: General;  Laterality: Right;   WISDOM TOOTH EXTRACTION Bilateral     There were no vitals filed for this visit.   Subjective Assessment - 05/13/21 1007     Subjective The Rt shoulder is still pinching but not as achy.  I did some walking and jogging on the TM today and it was fine.    Pertinent History DCIS right breast pt underwent bilateral mastectomy on 12/26/2020 with 0/3 nodes on right and lat flap on left with reentry into previous axillary node incision, bilateral expanders placed 04/01/21 with liposuction fat fill from abdomen.  Ovaries and fallopian tubes removed on 02/11/2021. 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 Patient reported she underwent a left lumpectomy and sentinel node biopsy on 04/04/2019 with 1 negative axillary node removed with a Ki67 of 60%. Pt completed radiation and chemo.    Currently in Pain? No/denies                               [facility] Adult PT Treatment/Exercise - 05/13/21 0001       Shoulder  Exercises: Supine   Protraction Both;10 reps    Protraction Weight (lbs) 2    Horizontal ABduction Both;10 reps    Theraband Level (Shoulder Horizontal ABduction) Level 2 (Red)    External Rotation Both;10 reps    Theraband Level (Shoulder External Rotation) Level 2 (Red)    External Rotation Limitations stopping before any pinch    Diagonals Both;10 reps    Theraband Level (Shoulder Diagonals) Level 2 (Red)    Diagonals Limitations avoiding any pinch      Shoulder Exercises: Standing   External Rotation Both;10 reps    External Rotation Limitations at cable machine 3#    Row Both;15 reps    Theraband Level (Shoulder Row) Level 3 (Green)    Other Standing Exercises ER walkouts Rt 1 step x 4 3# at cable machine      Shoulder Exercises: Pulleys   Flexion 2 minutes    Scaption 2 minutes    Scaption Limitations avoiding Rt shoulder pinch      Shoulder Exercises: Therapy Ball   Flexion 5 reps  Manual Therapy   Joint Mobilization GH AP grade IV 3x30" and 90/90 osciallations and PA glides    Soft tissue mobilization in sidelying to periscapular muscles mainly levator and rhomboids and UT    Passive ROM to th Rt shoulder avoiding any pinch                       PT Short Term Goals - 01/29/21 2020       PT SHORT TERM GOAL #1   Title Pt will be independent in a home exercise program for general strength in preparation for return to work    Time 4    Period Weeks    Status New               PT Long Term Goals - 04/28/21 1101       PT LONG TERM GOAL #1   Title Pt will have 160 of right and left shoulder aduction without pain so that she can return to normal activities    Time 6    Period Weeks    Status On-going      PT LONG TERM GOAL #2   Title Pt will demonstrate no pinching in the Rt shoulder with ER    Time 6    Period Weeks    Status New      PT LONG TERM GOAL #3   Title Pt will be independent in a home exercise program for ongoing fitness     Status On-going      PT LONG TERM GOAL #4   Title Pt will be able to return to work without limitations    Time 6    Period Weeks    Status New                   Plan - 05/13/21 1100     Clinical Impression Statement Pt able to add more strengthening but with weakness of the Lt ERs evident with walk outs today and with continued trembling in the UE with more activity.  Pt has some trigger points in the left levator scapula and will be getitng a massage later today    PT Frequency 2x / week    PT Duration 6 weeks    PT Treatment/Interventions ADLs/Self Care Home Management;Therapeutic exercise;Patient/family education;Manual techniques;Taping;Electrical Stimulation;Neuromuscular re-education;Balance training;DME Instruction;Passive range of motion    PT Next Visit Plan cont Rt STM, GH and scapular mob for impingement, may like foam roll, postural stregthening    Consulted and Agree with Plan of Care Patient             Patient will benefit from skilled therapeutic intervention in order to improve the following deficits and impairments:  Postural dysfunction, Pain, Decreased skin integrity, Increased muscle spasms, Impaired UE functional use, Increased fascial restricitons, Decreased strength, Decreased knowledge of use of DME, Decreased activity tolerance, Decreased scar mobility, Decreased range of motion, Decreased knowledge of precautions  Visit Diagnosis: Aftercare following surgery for neoplasm  Stiffness of left shoulder, not elsewhere classified  Abnormal posture  Other disturbances of skin sensation  Muscle weakness (generalized)     Problem List Patient Active Problem List   Diagnosis Date Noted   History of breast cancer 12/26/2020   Skin rash 04/29/2020   Port-A-Cath in place 06/02/2019   Intraductal papilloma of breast, right 04/21/2019   Genetic testing 03/31/2019   Family history of breast cancer    Family history of lung cancer  Malignant  neoplasm of upper-inner quadrant of left breast in female, estrogen receptor positive ([place]) 03/21/2019   NASAL POLYP 09/22/2007   ALLERGIC RHINITIS 09/22/2007   ASTHMA 09/22/2007   PERSONAL HISTORY OF ALLERGY TO LATEX 09/22/2007    Stark Bray, PT 05/13/2021, 11:10 AM  [facility] @ [place], [place], [address] Phone: [phone]   Fax:  [phone]  Name: Chelsea Hobbs MRN: [id number] Date of Birth: 23-Jan-1971

## 2021-05-19 ENCOUNTER — Other Ambulatory Visit: Payer: Self-pay

## 2021-05-19 ENCOUNTER — Inpatient Hospital Stay: Payer: BC Managed Care – PPO | Attending: Hematology

## 2021-05-19 ENCOUNTER — Inpatient Hospital Stay: Payer: BC Managed Care – PPO

## 2021-05-19 ENCOUNTER — Other Ambulatory Visit: Payer: Self-pay | Admitting: Hematology

## 2021-05-19 VITALS — BP 115/76 | HR 84 | Temp 97.7°F | Resp 17

## 2021-05-19 DIAGNOSIS — Z170 Estrogen receptor positive status [ER+]: Secondary | ICD-10-CM | POA: Insufficient documentation

## 2021-05-19 DIAGNOSIS — Z79811 Long term (current) use of aromatase inhibitors: Secondary | ICD-10-CM | POA: Diagnosis not present

## 2021-05-19 DIAGNOSIS — Z9013 Acquired absence of bilateral breasts and nipples: Secondary | ICD-10-CM | POA: Diagnosis not present

## 2021-05-19 DIAGNOSIS — Z923 Personal history of irradiation: Secondary | ICD-10-CM | POA: Insufficient documentation

## 2021-05-19 DIAGNOSIS — C50212 Malignant neoplasm of upper-inner quadrant of left female breast: Secondary | ICD-10-CM

## 2021-05-19 DIAGNOSIS — Z79899 Other long term (current) drug therapy: Secondary | ICD-10-CM | POA: Diagnosis not present

## 2021-05-19 DIAGNOSIS — D0511 Intraductal carcinoma in situ of right breast: Secondary | ICD-10-CM | POA: Diagnosis not present

## 2021-05-19 DIAGNOSIS — Z95828 Presence of other vascular implants and grafts: Secondary | ICD-10-CM

## 2021-05-19 DIAGNOSIS — Z9221 Personal history of antineoplastic chemotherapy: Secondary | ICD-10-CM | POA: Insufficient documentation

## 2021-05-19 LAB — CBC WITH DIFFERENTIAL (CANCER CENTER ONLY)
Abs Immature Granulocytes: 0.03 10*3/uL (ref 0.00–0.07)
Basophils Absolute: 0.1 10*3/uL (ref 0.0–0.1)
Basophils Relative: 1 %
Eosinophils Absolute: 0.7 10*3/uL — ABNORMAL HIGH (ref 0.0–0.5)
Eosinophils Relative: 9 %
HCT: 36.9 % (ref 36.0–46.0)
Hemoglobin: 12.8 g/dL (ref 12.0–15.0)
Immature Granulocytes: 0 %
Lymphocytes Relative: 36 %
Lymphs Abs: 2.8 10*3/uL (ref 0.7–4.0)
MCH: 30.8 pg (ref 26.0–34.0)
MCHC: 34.7 g/dL (ref 30.0–36.0)
MCV: 88.7 fL (ref 80.0–100.0)
Monocytes Absolute: 0.7 10*3/uL (ref 0.1–1.0)
Monocytes Relative: 8 %
Neutro Abs: 3.6 10*3/uL (ref 1.7–7.7)
Neutrophils Relative %: 46 %
Platelet Count: 352 10*3/uL (ref 150–400)
RBC: 4.16 MIL/uL (ref 3.87–5.11)
RDW: 12.8 % (ref 11.5–15.5)
WBC Count: 7.9 10*3/uL (ref 4.0–10.5)
nRBC: 0 % (ref 0.0–0.2)

## 2021-05-19 LAB — CMP (CANCER CENTER ONLY)
ALT: 21 U/L (ref 0–44)
AST: 21 U/L (ref 15–41)
Albumin: 4.2 g/dL (ref 3.5–5.0)
Alkaline Phosphatase: 71 U/L (ref 38–126)
Anion gap: 11 (ref 5–15)
BUN: 11 mg/dL (ref 6–20)
CO2: 25 mmol/L (ref 22–32)
Calcium: 9.3 mg/dL (ref 8.9–10.3)
Chloride: 106 mmol/L (ref 98–111)
Creatinine: 0.75 mg/dL (ref 0.44–1.00)
GFR, Estimated: >60 mL/min (ref >60)
Glucose, Bld: 84 mg/dL (ref 70–99)
Potassium: 3.5 mmol/L (ref 3.5–5.1)
Sodium: 142 mmol/L (ref 135–145)
Total Bilirubin: 0.3 mg/dL (ref 0.3–1.2)
Total Protein: 7.3 g/dL (ref 6.5–8.1)

## 2021-05-19 MED ORDER — ZOLEDRONIC ACID 4 MG/100ML IV SOLN
4.0000 mg | Freq: Once | INTRAVENOUS | Status: AC
  Administered 2021-05-19: 4 mg via INTRAVENOUS
  Filled 2021-05-19: qty 100

## 2021-05-19 MED ORDER — SODIUM CHLORIDE 0.9 % IV SOLN: Freq: Once | INTRAVENOUS | Status: AC

## 2021-05-19 NOTE — Progress Notes (Signed)
CMP & CBC w/Diff entered

## 2021-05-19 NOTE — Patient Instructions (Signed)

## 2021-05-20 ENCOUNTER — Encounter: Payer: Self-pay | Admitting: Rehabilitation

## 2021-05-20 ENCOUNTER — Ambulatory Visit: Payer: BC Managed Care – PPO | Admitting: Rehabilitation

## 2021-05-20 DIAGNOSIS — R208 Other disturbances of skin sensation: Secondary | ICD-10-CM

## 2021-05-20 DIAGNOSIS — C50212 Malignant neoplasm of upper-inner quadrant of left female breast: Secondary | ICD-10-CM

## 2021-05-20 DIAGNOSIS — M6281 Muscle weakness (generalized): Secondary | ICD-10-CM | POA: Diagnosis not present

## 2021-05-20 DIAGNOSIS — M25612 Stiffness of left shoulder, not elsewhere classified: Secondary | ICD-10-CM

## 2021-05-20 DIAGNOSIS — Z170 Estrogen receptor positive status [ER+]: Secondary | ICD-10-CM

## 2021-05-20 DIAGNOSIS — R293 Abnormal posture: Secondary | ICD-10-CM | POA: Diagnosis not present

## 2021-05-20 DIAGNOSIS — Z483 Aftercare following surgery for neoplasm: Secondary | ICD-10-CM | POA: Diagnosis not present

## 2021-05-20 NOTE — Therapy (Addendum)
[facility] @ [facility] [place], [place], [address] Phone: [phone]   Fax:  [phone]  Physical Therapy Treatment  Patient Details  Name: Chelsea Hobbs MRN: [id number] Date of Birth: [birth year] Referring Provider (PT): Dr. Iran Planas   Encounter Date: 05/20/2021   PT End of Session - 05/20/21 0848     Visit Number 7    Number of Visits 17    Date for PT Re-Evaluation 06/09/21    Authorization - Visit Number 7    Authorization - Number of Visits 30    PT Start Time 0803    PT Stop Time 6962    PT Time Calculation (min) 46 min    Activity Tolerance Patient tolerated treatment well    Behavior During Therapy [facility] for tasks assessed/performed             Past Medical History:  Diagnosis Date   Chemotherapy-induced neuropathy ([place])    per pt effects hands, legs, and feet   Family history of breast cancer    Family history of lung cancer    History of 2019 novel coronavirus disease (COVID-19) 07/17/2019   positive result in care everywhere, mild symptoms that resolved   History of colitis    followed by dr Linna Darner (GI)--- per pt dx age 75s , residual scarring, take lialda daily   History of right breast cancer 10/2020   followed by oncology---  04-17-2020 s/p right breast lumpectomy, fibroadema;  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 Burr Medico;  dx left breast IDC w/ DCIS, Stage IA,  04-04-2019 s/p left breast lumpectomy with node bx;  completed chemo 06-10-2020 and radiation 09-26-2019;  ;  12-26-2020 s/p bilateral  mastectomy with reconstruction with right node bx;  right breast DCIS dx 04/ 2022   Mild asthma    followed by pcp   Personal history of chemotherapy    05-12-2019  to 06-10-2020, left breast  cancer   Personal history of radiation therapy    08-29-2019  to  09-26-2019,  left breast cancer    Past Surgical History:  Procedure Laterality Date   BREAST LUMPECTOMY Left 04/04/2019   BREAST LUMPECTOMY WITH RADIOACTIVE SEED AND SENTINEL LYMPH NODE BIOPSY Left 04/04/2019   Procedure: RADIOCATIVE SEED GUIDED LEFT BREAST LUMPECTOMY, LEFT AXILLARY SENTINEL LYMPH NODE BIOPSY;  Surgeon: Erroll Luna, MD;  Location: [facility];  Service: General;  Laterality: Left;   BREAST LUMPECTOMY WITH RADIOACTIVE SEED LOCALIZATION Right 04/17/2020   Procedure: RIGHT BREAST LUMPECTOMY WITH RADIOACTIVE SEED LOCALIZATION;  Surgeon: Erroll Luna, MD;  Location: [facility];  Service: General;  Laterality: Right;   BREAST RECONSTRUCTION WITH PLACEMENT OF TISSUE EXPANDER AND ALLODERM Right 12/26/2020   Procedure: RIGHT BREAST RECONSTRUCTION WITH PLACEMENT OF TISSUE EXPANDER AND ALLODERM;  Surgeon: Irene Limbo, MD;  Location: [facility];  Service: Plastics;  Laterality: Right;   COLONOSCOPY  2021   LAPAROSCOPIC SALPINGO OOPHERECTOMY Bilateral 02/11/2021   Procedure: LAPAROSCOPIC BILATERAL SALPINGO OOPHORECTOMY;  Surgeon: [name], MD;  Location: [facility];  Service: Gynecology;  Laterality: Bilateral;   LATISSIMUS FLAP TO BREAST Left 12/26/2020   Procedure: LEFT BREAST RECONSTRUCTION WITH LATISSIMUS FLAP AND PLACEMENT OF TISSUE EXPANDER;  Surgeon: Irene Limbo, MD;  Location: [facility] [address]  OR;  Service: Clinical cytogeneticist;  Laterality: Left;   LIPOSUCTION WITH LIPOFILLING Bilateral 04/01/2021   Procedure: LIPOFILLING BILATERAL CHEST;  Surgeon: Irene Limbo, MD;  Location: [facility];  Service: Plastics;  Laterality: Bilateral;   NASAL SINUS SURGERY Bilateral 2009   NIPPLE SPARING MASTECTOMY Bilateral 12/26/2020   Procedure: BILATERAL SKIN SPARING MASTECTOMIES;  Surgeon: Erroll Luna, MD;  Location: [facility];  Service: General;  Laterality:  Bilateral;   PORT-A-CATH REMOVAL Right 04/17/2020   Procedure: REMOVAL PORT-A-CATH;  Surgeon: Erroll Luna, MD;  Location: [facility];  Service: General;  Laterality: Right;   PORTACATH PLACEMENT N/A 04/04/2019   Procedure: INSERTION PORT-A-CATH WITH ULTRASOUND;  Surgeon: Erroll Luna, MD;  Location: [facility];  Service: General;  Laterality: N/A;   REMOVAL OF BILATERAL TISSUE EXPANDERS WITH PLACEMENT OF BILATERAL BREAST IMPLANTS Bilateral 04/01/2021   Procedure: REMOVAL OF BILATERAL TISSUE EXPANDERS WITH PLACEMENT OF BILATERAL BREAST SILICONE IMPLANTS;  Surgeon: Irene Limbo, MD;  Location: [facility];  Service: Plastics;  Laterality: Bilateral;   SENTINEL NODE BIOPSY Right 12/26/2020   Procedure: RIGHT AXILLARY SENTINEL LYMPH NODE BIOPSY;  Surgeon: Erroll Luna, MD;  Location: [facility];  Service: General;  Laterality: Right;   WISDOM TOOTH EXTRACTION Bilateral     There were no vitals filed for this visit.   Subjective Assessment - 05/20/21 0805     Subjective I was at the gym today already.  It is doing ok.    Pertinent History DCIS right breast pt underwent bilateral mastectomy on 12/26/2020 with 0/3 nodes on right and lat flap on left with reentry into previous axillary node incision, bilateral expanders placed 04/01/21 with liposuction fat fill from abdomen.  Ovaries and fallopian tubes removed on 02/11/2021. 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 Patient reported she underwent a left lumpectomy and sentinel node biopsy on 04/04/2019 with 1 negative axillary node removed with a Ki67 of 60%. Pt completed radiation and chemo.    Currently in Pain? No/denies                               [facility] Adult PT Treatment/Exercise - 05/20/21 0001       Exercises   Other Exercises  1/2 foam roll series: pectoralis stretch x  60" pec minor and major, pro/ret x 10, alternating flexion x 10 bil, snow angel x 10      Shoulder Exercises: Supine   Horizontal ABduction Both;20 reps    Theraband Level (Shoulder Horizontal ABduction) Level 2 (Red)    Diagonals Both;20 reps    Theraband Level (Shoulder Diagonals) Level 2 (Red)    Diagonals Limitations avoiding any pinch      Shoulder Exercises: Standing   External Rotation Both;10 reps    External Rotation Limitations at cable machine 3#    Row Both;10 reps    Row Limitations 7# at cable machine    Other Standing Exercises ER walkouts bil 1 step x 4 3# at cable machine      Shoulder Exercises: Pulleys   Flexion 2 minutes    Scaption 2 minutes      Manual Therapy   Joint Mobilization GH AP grade IV 3x30" and 90/90 osciallations and PA glides  PT Short Term Goals - 01/29/21 2020       PT SHORT TERM GOAL #1   Title Pt will be independent in a home exercise program for general strength in preparation for return to work    Time 4    Period Weeks    Status New               PT Long Term Goals - 04/28/21 1101       PT LONG TERM GOAL #1   Title Pt will have 160 of right and left shoulder aduction without pain so that she can return to normal activities    Time 6    Period Weeks    Status On-going      PT LONG TERM GOAL #2   Title Pt will demonstrate no pinching in the Rt shoulder with ER    Time 6    Period Weeks    Status New      PT LONG TERM GOAL #3   Title Pt will be independent in a home exercise program for ongoing fitness    Status On-going      PT LONG TERM GOAL #4   Title Pt will be able to return to work without limitations    Time 6    Period Weeks    Status New                   Plan - 05/20/21 0849     Clinical Impression Statement Pt had a massage after last appt that focused on the upper body and pt has a bit less guarding and more GH separation of movement during PROM today.   Tolerated all TE well without pinch and minimal PROM/STM needed    PT Frequency 2x / week    PT Duration 6 weeks    PT Treatment/Interventions ADLs/Self Care Home Management;Therapeutic exercise;Patient/family education;Manual techniques;Taping;Electrical Stimulation;Neuromuscular re-education;Balance training;DME Instruction;Passive range of motion    PT Next Visit Plan cont Rt STM/PROM as needed, GH and scapular mob for impingement, may like foam roll, postural stregthening    Consulted and Agree with Plan of Care Patient             Patient will benefit from skilled therapeutic intervention in order to improve the following deficits and impairments:     Visit Diagnosis: Aftercare following surgery for neoplasm  Stiffness of left shoulder, not elsewhere classified  Abnormal posture  Muscle weakness (generalized)  Other disturbances of skin sensation  Malignant neoplasm of upper-inner quadrant of left breast in female, estrogen receptor positive ([place])     Problem List Patient Active Problem List   Diagnosis Date Noted   History of breast cancer 12/26/2020   Skin rash 04/29/2020   Port-A-Cath in place 06/02/2019   Intraductal papilloma of breast, right 04/21/2019   Genetic testing 03/31/2019   Family history of breast cancer    Family history of lung cancer    Malignant neoplasm of upper-inner quadrant of left breast in female, estrogen receptor positive ([place]) 03/21/2019   NASAL POLYP 09/22/2007   ALLERGIC RHINITIS 09/22/2007   ASTHMA 09/22/2007   PERSONAL HISTORY OF ALLERGY TO LATEX 09/22/2007    Stark Bray, PT 05/20/2021, 8:51 AM  [facility] @ [address] [place], [place], [address] Phone: [phone]   Fax:  [phone]  Name: Chelsea Hobbs MRN: [id number] Date of Birth: 1970-09-14   PHYSICAL THERAPY DISCHARGE SUMMARY  Visits  from Start of Care: 7  Current functional level related to goals /  functional outcomes: Return to gym   Remaining deficits: History of breast cancer tightness   Education / Equipment: Final HEP  Plan: Patient agrees to discharge.  Patient goals were met.

## 2021-05-26 ENCOUNTER — Ambulatory Visit: Payer: BC Managed Care – PPO | Admitting: Rehabilitation

## 2021-05-30 ENCOUNTER — Ambulatory Visit: Payer: BC Managed Care – PPO | Admitting: Physical Therapy

## 2021-06-09 ENCOUNTER — Encounter: Payer: BC Managed Care – PPO | Admitting: Rehabilitation

## 2021-06-25 ENCOUNTER — Other Ambulatory Visit: Payer: Self-pay | Admitting: Hematology

## 2021-06-25 MED ORDER — ANASTROZOLE 1 MG PO TABS: 1.0000 mg | ORAL_TABLET | Freq: Every day | ORAL | 3 refills | Status: DC

## 2021-07-07 ENCOUNTER — Other Ambulatory Visit: Payer: Self-pay | Admitting: Hematology

## 2021-07-07 DIAGNOSIS — T451X5A Adverse effect of antineoplastic and immunosuppressive drugs, initial encounter: Secondary | ICD-10-CM

## 2021-07-21 ENCOUNTER — Inpatient Hospital Stay: Payer: BC Managed Care – PPO | Attending: Hematology | Admitting: Hematology

## 2021-07-21 ENCOUNTER — Encounter: Payer: Self-pay | Admitting: Hematology

## 2021-07-21 DIAGNOSIS — C50212 Malignant neoplasm of upper-inner quadrant of left female breast: Secondary | ICD-10-CM | POA: Diagnosis not present

## 2021-07-21 DIAGNOSIS — Z170 Estrogen receptor positive status [ER+]: Secondary | ICD-10-CM | POA: Diagnosis not present

## 2021-07-21 NOTE — Progress Notes (Signed)
[place]   [phone] [phone]   Clinic Follow up Note   Patient Care Team: Wenda Low, MD as PCP - General (Internal Medicine) Chelsea Germany, RN as Oncology Nurse Navigator Mauro Kaufmann, RN as Oncology Nurse Navigator Erroll Luna, MD as Consulting Physician (General Surgery) Truitt Merle, MD as Consulting Physician (Hematology) Chelsea Pray, MD as Consulting Physician (Radiation Oncology) Chelsea Feeling, NP as Nurse Practitioner (Nurse Practitioner)  Date of Service:  07/21/2021  I connected with Chelsea Hobbs on 07/21/2021 at 11:00 AM EST by telephone visit and verified that I am speaking with the correct person using two identifiers.  I discussed the limitations, risks, security and privacy concerns of performing an evaluation and management service by telephone and the availability of in person appointments. I also discussed with the patient that there may be a patient responsible charge related to this service. The patient expressed understanding and agreed to proceed.   Other persons participating in the visit and their role in the encounter:  none  Patient's location:  home Provider's location:  my office  CHIEF COMPLAINT: f/u of left breast cancer  CURRENT THERAPY:  Zometa every 6 months starting 11/27/19 Tamoxifen 39m daily starting 10/12/19. Switched to Anastrozole in 09/2020  ASSESSMENT & PLAN:  Chelsea PEROTis a 51y.o. female with   1. Right breast DCIS, ER/PR+ -found on screening MRI 10/18/20 showing 3 cm area of non-mass-like enhancement in the lower right breast. Biopsy on 10/31/20 showed low-grade DCIS and ALH. -she opted to proceed with bilateral mastectomies on 12/26/20 under Dr. CBrantley Stagewith reconstruction by Dr. TIran Hobbs Pathology showed: focal DCIS, low to intermediate grade, with calcifications; lobular neoplasia. ER and PR both strongly positive. Left breast and right axillary lymph nodes were  negative. -s/p BSO on 02/11/21. -she completed reconstruction on 04/01/21 under Dr. TIran Hobbs -she has been on anastrozole since 09/2020. She attempted to switch to exemestane in 04/2021 due to joint pain, but she had additional side effects on the exemestane and ultimately decided to switch back. She feels her joint pain is manageable. -she is clinically doing well otherwise. We will continue to follow up every 6 months.   2. Malignant neoplasm of upper-inner quadrant of left breast, Hobbs IA, p(T1cN0M0), ER+/PR+, HER2+, Grade III -She was diagnosed in 03/2019. She underwent left breast lumpectomy and SLNB on 04/04/19.  -She completed adjuvant Taxol/Herceptin, maintenance Herceptin, and adjuvant Radiation. -she started tamoxifen in 10/2019 and switched to anastrozole in 09/2020   3. Bone Health -she began Zometa on 11/27/19 and has received 4 doses. She will receive her final dose in 11/2021. -she endorses taking vit D     PLAN:  -continue anastrozole -lab, f/u, and Zometa in 11/2021   No problem-specific Assessment & Plan notes found for this encounter.   SUMMARY OF ONCOLOGIC HISTORY: Oncology History Overview Note  Cancer Staging Malignant neoplasm of upper-inner quadrant of left breast in female, estrogen receptor positive ([place] Staging form: Breast, AJCC 8th Edition - Clinical Hobbs from 03/22/2019: Hobbs IA (cT1c, cN0, cM0, G3, ER+, PR+, HER2+) - Signed by Chelsea Merle MD on 03/22/2019 Hobbs prefix: Initial diagnosis Histologic grading system: 3 grade system - Pathologic Hobbs from 04/04/2019: Hobbs IA (pT1c, pN0, cM0, G3, ER+, PR+, HER2+) - Signed by Chelsea Merle MD on 04/21/2019 Hobbs prefix: Initial diagnosis Multigene prognostic tests performed: None Histologic grading system: 3 grade system Residual tumor (R): R0 - None - Pathologic: No Hobbs assigned - Unsigned  Malignant neoplasm of upper-inner quadrant of left breast in female, estrogen receptor positive ([place])  03/10/2019  Mammogram   Diagnostic Mammogram 03/10/19  IMPRESSION: 1. Suspicious mass in the 10 o'clock position of the left breast, 3cm from the nipple measuring 12x7x52m. Biopsy is indicated. 2. Indeterminate mass in the 5:30 o'clock position of the left breast, 5cm from the nipple, measuring 9x6x760m Although this may reflect a fibroadenoma, tissue sampling is recommended.   03/16/2019 Initial Biopsy   Diagnosis 03/16/19  1. 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 SEE NOTE 2. Breast, left, needle core biopsy, 5:30 o'clock - FIBROADENOMA   03/16/2019 Receptors her2   Results: IMMUNOHISTOCHEMICAL AND MORPHOMETRIC ANALYSIS PERFORMED MANUALLY The tumor cells are POSITIVE for Her2 (3+). Estrogen Receptor: 100%, POSITIVE, STRONG STAINING INTENSITY Progesterone Receptor: 100%, POSITIVE, STRONG STAINING INTENSITY Proliferation Marker Ki67: 60%   03/21/2019 Initial Diagnosis   Malignant neoplasm of upper-inner quadrant of left breast in female, estrogen receptor positive ([place]  03/22/2019 Cancer Staging   Staging form: Breast, AJCC 8th Edition - Clinical Hobbs from 03/22/2019: Hobbs IA (cT1c, cN0, cM0, G3, ER+, PR+, HER2+) - Signed by FeTruitt MerleMD on 03/22/2019    03/31/2019 Genetic Testing   VUS in CTNNA1 called [id number] (Intronic) was identified on the [facility] Common Hereditary Cancers Panel. The Common Hereditary Cancers Panel offered by [facility] includes sequencing and/or deletion duplication testing of the following 47 genes: APC, ATM, AXIN2, BARD1, BMPR1A, BRCA1, BRCA2, BRIP1, CDH1, CDKN2A (p14ARF), CDKN2A (p16INK4a), CKD4, CHEK2, CTNNA1, DICER1, EPCAM (Deletion/duplication testing only), GREM1 (promoter region deletion/duplication testing only), KIT, MEN1, MLH1, MSH2, MSH3, MSH6, MUTYH, NBN, NF1, NHTL1, PALB2, PDGFRA, PMS2, POLD1, POLE, PTEN, RAD50, RAD51C, RAD51D, SDHB, SDHC, SDHD, SMAD4, SMARCA4. STK11, TP53, TSC1, TSC2, and VHL.  The following genes were  evaluated for sequence changes only: SDHA and HOXB13 c.251G>A variant only. The report date is 03/30/2019.    03/31/2019 Breast MRI   MRI breast 03/31/19  IMPRESSION: 1. 5 millimeter enhancing oval mass in the anteromedial aspect of the RIGHT breast warranting further evaluation. Given its anterior depth and visibility on noncontrast images, ultrasound is recommended to determine if there is a sonographic correlate. If no sonographic correlate is identified to guide biopsy, MRI biopsy of the RIGHT breast is recommended. 2. Known malignancy in the UPPER INNER QUADRANT of the LEFT breast measuring 1.0 centimeters. 3. 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   04/04/2019 Surgery   RADIOCATIVE SEED GUIDED LEFT BREAST LUMPECTOMY, LEFT AXILLARY SENTINEL LYMPH NODE BIOPSY and PAC placement  By Dr. CoBrantley Stage9/22/20    04/04/2019 Pathology Results   DIAGNOSIS: 04/04/19  A. BREAST, LEFT, LUMPECTOMY:  -  Invasive ductal carcinoma, Nottingham grade 3 of 3, 1.3 cm  -  Ductal carcinoma in-situ, intermediate grade  -  Margins uninvolved by carcinoma (0.1 cm; posterior margin; see  comment)  -  Previous biopsy site changes present  -  See oncology table and comment below   B. BREAST, LEFT, ADDITIONAL MEDIAL MARGIN, EXCISION:  -  Fibrocystic changes  -  No residual carcinoma identified   C. LYMPH NODE, LEFT AXILLARY, SENTINEL, BIOPSY:  -   No carcinoma identified in one lymph node (0/1)    04/04/2019 Cancer Staging   Staging form: Breast, AJCC 8th Edition - Pathologic Hobbs from 04/04/2019: Hobbs IA (pT1c, pN0, cM0, G3, ER+, PR+, HER2+) - Signed by FeTruitt MerleMD on 04/21/2019    04/11/2019 Pathology Results  Diagnosis Breast, right, needle core biopsy, 2 o'clock - SMALL INTRADUCTAL PAPILLOMA. SEE NOTE - SMALL FIBROADENOMA - NEGATIVE FOR CARCINOMA   05/12/2019 - 06/10/2020 Chemotherapy   Adjuvant Weekly Taxol and Herceptin for 12 weeks starting 05/12/19,  switched to Abraxane on 08/04/19 due to skin rash. Completed on 08/11/19.  Followed by maintenance Herceptin q3weeks to complete 1 year treatment (from 05/12/19) starting 09/01/19-06/10/20   08/29/2019 - 09/26/2019 Radiation Therapy   Adjuvant radiation per Dr. Sondra Come    10/12/2019 -  Anti-estrogen oral therapy   Tamoxifen 69m daily starting 10/12/19   11/27/2019 Survivorship   SCP delivered by LCira Rue NP    03/15/2020 Mammogram   IMPRESSION: 1. Expected surgical changes at the lumpectomy site in the central posterior left breast.   2. No suspicious changes at the site of the biopsied papilloma in the lower inner retroareolar right breast (ribbon clip). Surgical excision is planned for this lesion.   3.  No evidence of malignancy in the bilateral breasts.   04/17/2020 Surgery   RIGHT BREAST LUMPECTOMY WITH RADIOACTIVE SEED LOCALIZATION and PAC removal by Dr CBrantley Hobbs  FINAL MICROSCOPIC DIAGNOSIS:   A. BREAST, RIGHT, LUMPECTOMY:  - Biopsy clip.  Fibroadenoma.  Fibroadenomatoid change.  Fibrocystic  change.        INTERVAL HISTORY:  Chelsea Hobbs was contacted for a follow up of breast cancer. She was last seen by me on 04/14/21. She reports she "didn't feel right" on the exemestane, so she opted to switch back to anastrozole.    All other systems were reviewed with the patient and are negative.  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, legs, and feet   Family history of breast cancer    Family history of lung cancer    History of 2019 novel coronavirus disease (COVID-19) 07/17/2019   positive result in care everywhere, mild symptoms that resolved   History of colitis    followed by dr kLinna Darner(GI)--- per pt dx age [age]s, residual scarring, take lialda daily   History of right breast cancer 10/2020   followed by oncology---  04-17-2020 s/p right breast lumpectomy, fibroadema;  dx 04/ 2022 w/  DCIS right breast  s/p right mastectomy with node bx 12-26-2020 dx DCIS Hobbs 0 (no chemo/ radiation)   Malignant neoplasm of upper-inner quadrant of left breast in female, estrogen receptor positive ([place] 03/2019   oncologist--- dr y. fBurr Medico  dx left breast IDC w/ DCIS, Hobbs IA,  04-04-2019 s/p left breast lumpectomy with node bx;  completed chemo 06-10-2020 and radiation 09-26-2019;  ;  12-26-2020 s/p bilateral  mastectomy with reconstruction with right node bx;  right breast DCIS dx 04/ 2022   Mild asthma    followed by pcp   Personal history of chemotherapy    05-12-2019  to 06-10-2020, left breast cancer   Personal history of radiation therapy    08-29-2019  to  09-26-2019,  left breast cancer    SURGICAL HISTORY: Past Surgical History:  Procedure Laterality Date   BREAST LUMPECTOMY Left 04/04/2019   BREAST LUMPECTOMY WITH RADIOACTIVE SEED AND SENTINEL LYMPH NODE BIOPSY Left 04/04/2019   Procedure: RADIOCATIVE SEED GUIDED LEFT BREAST LUMPECTOMY, LEFT AXILLARY SENTINEL LYMPH NODE BIOPSY;  Surgeon: CErroll Luna MD;  [place] [facility]  Service: General;  Laterality: Left;   BREAST LUMPECTOMY WITH RADIOACTIVE SEED LOCALIZATION Right 04/17/2020   Procedure: RIGHT BREAST LUMPECTOMY  WITH RADIOACTIVE SEED LOCALIZATION;  Surgeon: Erroll Luna, MD;  Location: [place];  Service: General;  Laterality: Right;   BREAST RECONSTRUCTION WITH PLACEMENT OF TISSUE EXPANDER AND ALLODERM Right 12/26/2020   Procedure: RIGHT BREAST RECONSTRUCTION WITH PLACEMENT OF TISSUE EXPANDER AND ALLODERM;  Surgeon: Irene Limbo, MD;  Location: [place];  Service: Plastics;  Laterality: Right;   COLONOSCOPY  2021   LAPAROSCOPIC SALPINGO OOPHERECTOMY Bilateral 02/11/2021   Procedure: LAPAROSCOPIC BILATERAL SALPINGO OOPHORECTOMY;  Surgeon: [name], MD;  Location[facility];  Service: Gynecology;  Laterality: Bilateral;   LATISSIMUS FLAP TO BREAST Left  12/26/2020   Procedure: LEFT BREAST RECONSTRUCTION WITH LATISSIMUS FLAP AND PLACEMENT OF TISSUE EXPANDER;  Surgeon: Irene Limbo, MD;  Location: [place];  Service: Plastics;  Laterality: Left;   LIPOSUCTION WITH LIPOFILLING Bilateral 04/01/2021   Procedure: LIPOFILLING BILATERAL CHEST;  Surgeon: Irene Limbo, MD;  Location: [place];  Service: Plastics;  Laterality: Bilateral;   NASAL SINUS SURGERY Bilateral 2009   NIPPLE SPARING MASTECTOMY Bilateral 12/26/2020   Procedure: BILATERAL SKIN SPARING MASTECTOMIES;  Surgeon: Erroll Luna, MD;  Location: [place];  Service: General;  Laterality: Bilateral;   PORT-A-CATH REMOVAL Right 04/17/2020   Procedure: REMOVAL PORT-A-CATH;  Surgeon: Erroll Luna, MD;  Location: [place];  Service: General;  Laterality: Right;   PORTACATH PLACEMENT N/A 04/04/2019   Procedure: INSERTION PORT-A-CATH WITH ULTRASOUND;  Surgeon: Erroll Luna, MD;  Location: [place];  Service: General;  Laterality: N/A;   REMOVAL OF BILATERAL TISSUE EXPANDERS WITH PLACEMENT OF BILATERAL BREAST IMPLANTS Bilateral 04/01/2021   Procedure: REMOVAL OF BILATERAL TISSUE EXPANDERS WITH PLACEMENT OF BILATERAL BREAST SILICONE IMPLANTS;  Surgeon: Irene Limbo, MD;  Location: [place];  Service: Plastics;  Laterality: Bilateral;   SENTINEL NODE BIOPSY Right 12/26/2020   Procedure: RIGHT AXILLARY SENTINEL LYMPH NODE BIOPSY;  Surgeon: Erroll Luna, MD;  Location: [place];  Service: General;  Laterality: Right;   WISDOM TOOTH EXTRACTION Bilateral     I have reviewed the social history and family history with the patient and they are unchanged from previous note.  ALLERGIES:  is allergic to gadolinium derivatives, latex, tetracycline hcl, and wound dressing adhesive.  MEDICATIONS:  Current Outpatient Medications  Medication Sig Dispense Refill   albuterol (PROVENTIL HFA;VENTOLIN HFA) 108 (90 BASE) MCG/ACT inhaler  Inhale 2 puffs into the lungs every 6 (six) hours as needed for shortness of breath.     ALPRAZolam (XANAX) 0.5 MG tablet Take 0.25 mg by mouth 2 (two) times daily as needed for anxiety.     anastrozole (ARIMIDEX) 1 MG tablet Take 1 tablet (1 mg total) by mouth daily. 30 tablet 3   Ascorbic Acid (VITAMIN C) 1000 MG tablet Take 2,000 mg by mouth daily. Lysine     Calcium-Magnesium-Vitamin D (CALCIUM MAGNESIUM PO) Take 1,200 mg by mouth daily.     Cholecalciferol (VITAMIN D) 125 MCG (5000 UT) CAPS Take 5,000 Units by mouth daily.     docusate sodium (COLACE) 100 MG capsule Take 1 capsule (100 mg total) by mouth 2 (two) times daily. 60 capsule 2   exemestane (AROMASIN) 25 MG tablet Take 1 tablet (25 mg total) by mouth daily after breakfast. 30 tablet 3   fexofenadine (ALLEGRA) 180 MG tablet Take 180 mg by mouth daily as needed for allergies.     fluticasone (FLONASE) 50 MCG/ACT nasal spray Place 1 spray into both nostrils daily as needed for allergies.  gabapentin (NEURONTIN) 100 MG capsule Take 2 capsules (200 mg total) by mouth at bedtime. 60 capsule 6   ibuprofen (ADVIL) 600 MG tablet Take 1 tablet (600 mg total) by mouth every 6 (six) hours as needed. 30 tablet 0   mesalamine (LIALDA) 1.2 g EC tablet Take 1.2 g by mouth at bedtime.     methocarbamol (ROBAXIN) 500 MG tablet Take 500 mg by mouth 3 (three) times daily as needed.     montelukast (SINGULAIR) 10 MG tablet Take 1 tablet by mouth at bedtime.     Multiple Vitamins-Minerals (MULTIVITAMIN WITH MINERALS) tablet Take 1 tablet by mouth daily.     Omega-3 1000 MG CAPS Take 1,000 mg by mouth daily.     oxyCODONE (OXY IR/ROXICODONE) 5 MG immediate release tablet Take 1 tablet (5 mg total) by mouth every 4 (four) hours as needed for severe pain. 15 tablet 0   SUMAtriptan (IMITREX) 100 MG tablet Take 100 mg by mouth every 2 (two) hours as needed for migraine.     venlafaxine XR (EFFEXOR-XR) 37.5 MG 24 hr capsule TAKE 1 CAPSULE BY MOUTH DAILY  WITH BREAKFAST. (Patient taking differently: Take 37.5 mg by mouth daily with breakfast.) 90 capsule 1   Zinc 25 MG TABS Take 25 mg by mouth daily.     No current facility-administered medications for this visit.    PHYSICAL EXAMINATION: ECOG PERFORMANCE STATUS: 0 - Asymptomatic  There were no vitals filed for this visit. Wt Readings from Last 3 Encounters:  04/14/21 148 lb 4.8 oz (67.3 kg)  04/01/21 146 lb 6.2 oz (66.4 kg)  02/11/21 144 lb (65.3 kg)     No vitals taken today, Exam not performed today  LABORATORY DATA:  I have reviewed the data as listed CBC Latest Ref Rng & Units 05/19/2021 04/14/2021 02/07/2021  WBC 4.0 - 10.5 K/uL 7.9 6.9 7.0  Hemoglobin 12.0 - 15.0 g/dL 12.8 12.0 12.9  Hematocrit 36.0 - 46.0 % 36.9 35.2(L) 38.3  Platelets 150 - 400 K/uL 352 347 347     CMP Latest Ref Rng & Units 05/19/2021 04/14/2021 12/20/2020  Glucose 70 - 99 mg/dL 84 99 90  BUN 6 - 20 mg/dL '11 14 12  ' Creatinine 0.44 - 1.00 mg/dL 0.75 0.72 0.57  Sodium 135 - 145 mmol/L 142 140 139  Potassium 3.5 - 5.1 mmol/L 3.5 4.4 3.9  Chloride 98 - 111 mmol/L 106 104 105  CO2 22 - 32 mmol/L '25 26 27  ' Calcium 8.9 - 10.3 mg/dL 9.3 9.0 9.6  Total Protein 6.5 - 8.1 g/dL 7.3 7.0 7.2  Total Bilirubin 0.3 - 1.2 mg/dL 0.3 0.3 0.8  Alkaline Phos 38 - 126 U/L 71 64 52  AST 15 - 41 U/L 21 17 34  ALT 0 - 44 U/L 21 25 50(H)      RADIOGRAPHIC STUDIES: I have personally reviewed the radiological images as listed and agreed with the findings in the report. No results found.    No orders of the defined types were placed in this encounter.  All questions were answered. The patient knows to call the clinic with any problems, questions or concerns. No barriers to learning was detected. The total time spent in the appointment was 11 minutes.     Truitt Merle, MD 07/21/2021   I, Wilburn Mylar, am acting as scribe for Truitt Merle, MD.   I have reviewed the above documentation for accuracy and completeness, and I  agree with the above.

## 2021-08-10 ENCOUNTER — Other Ambulatory Visit: Payer: Self-pay | Admitting: Hematology

## 2021-08-10 DIAGNOSIS — G620 Drug-induced polyneuropathy: Secondary | ICD-10-CM

## 2021-08-10 DIAGNOSIS — T451X5A Adverse effect of antineoplastic and immunosuppressive drugs, initial encounter: Secondary | ICD-10-CM

## 2021-09-01 DIAGNOSIS — M9902 Segmental and somatic dysfunction of thoracic region: Secondary | ICD-10-CM | POA: Diagnosis not present

## 2021-09-01 DIAGNOSIS — M5413 Radiculopathy, cervicothoracic region: Secondary | ICD-10-CM | POA: Diagnosis not present

## 2021-09-01 DIAGNOSIS — M9901 Segmental and somatic dysfunction of cervical region: Secondary | ICD-10-CM | POA: Diagnosis not present

## 2021-09-01 DIAGNOSIS — M531 Cervicobrachial syndrome: Secondary | ICD-10-CM | POA: Diagnosis not present

## 2021-09-05 DIAGNOSIS — M5413 Radiculopathy, cervicothoracic region: Secondary | ICD-10-CM | POA: Diagnosis not present

## 2021-09-05 DIAGNOSIS — M9901 Segmental and somatic dysfunction of cervical region: Secondary | ICD-10-CM | POA: Diagnosis not present

## 2021-09-05 DIAGNOSIS — M9902 Segmental and somatic dysfunction of thoracic region: Secondary | ICD-10-CM | POA: Diagnosis not present

## 2021-09-05 DIAGNOSIS — M531 Cervicobrachial syndrome: Secondary | ICD-10-CM | POA: Diagnosis not present

## 2021-09-11 DIAGNOSIS — M9901 Segmental and somatic dysfunction of cervical region: Secondary | ICD-10-CM | POA: Diagnosis not present

## 2021-09-11 DIAGNOSIS — M9902 Segmental and somatic dysfunction of thoracic region: Secondary | ICD-10-CM | POA: Diagnosis not present

## 2021-09-11 DIAGNOSIS — M531 Cervicobrachial syndrome: Secondary | ICD-10-CM | POA: Diagnosis not present

## 2021-09-11 DIAGNOSIS — M5413 Radiculopathy, cervicothoracic region: Secondary | ICD-10-CM | POA: Diagnosis not present

## 2021-09-12 DIAGNOSIS — M531 Cervicobrachial syndrome: Secondary | ICD-10-CM | POA: Diagnosis not present

## 2021-09-12 DIAGNOSIS — M9902 Segmental and somatic dysfunction of thoracic region: Secondary | ICD-10-CM | POA: Diagnosis not present

## 2021-09-12 DIAGNOSIS — M5413 Radiculopathy, cervicothoracic region: Secondary | ICD-10-CM | POA: Diagnosis not present

## 2021-09-12 DIAGNOSIS — M9901 Segmental and somatic dysfunction of cervical region: Secondary | ICD-10-CM | POA: Diagnosis not present

## 2021-09-15 DIAGNOSIS — M9902 Segmental and somatic dysfunction of thoracic region: Secondary | ICD-10-CM | POA: Diagnosis not present

## 2021-09-15 DIAGNOSIS — M5413 Radiculopathy, cervicothoracic region: Secondary | ICD-10-CM | POA: Diagnosis not present

## 2021-09-15 DIAGNOSIS — M531 Cervicobrachial syndrome: Secondary | ICD-10-CM | POA: Diagnosis not present

## 2021-09-15 DIAGNOSIS — M9901 Segmental and somatic dysfunction of cervical region: Secondary | ICD-10-CM | POA: Diagnosis not present

## 2021-09-19 DIAGNOSIS — M9901 Segmental and somatic dysfunction of cervical region: Secondary | ICD-10-CM | POA: Diagnosis not present

## 2021-09-19 DIAGNOSIS — M9902 Segmental and somatic dysfunction of thoracic region: Secondary | ICD-10-CM | POA: Diagnosis not present

## 2021-09-19 DIAGNOSIS — M531 Cervicobrachial syndrome: Secondary | ICD-10-CM | POA: Diagnosis not present

## 2021-09-19 DIAGNOSIS — M5413 Radiculopathy, cervicothoracic region: Secondary | ICD-10-CM | POA: Diagnosis not present

## 2021-09-22 ENCOUNTER — Other Ambulatory Visit: Payer: Self-pay | Admitting: Hematology

## 2021-09-22 DIAGNOSIS — M9901 Segmental and somatic dysfunction of cervical region: Secondary | ICD-10-CM | POA: Diagnosis not present

## 2021-09-22 DIAGNOSIS — M5413 Radiculopathy, cervicothoracic region: Secondary | ICD-10-CM | POA: Diagnosis not present

## 2021-09-22 DIAGNOSIS — M531 Cervicobrachial syndrome: Secondary | ICD-10-CM | POA: Diagnosis not present

## 2021-09-22 DIAGNOSIS — M9902 Segmental and somatic dysfunction of thoracic region: Secondary | ICD-10-CM | POA: Diagnosis not present

## 2021-09-26 DIAGNOSIS — M9901 Segmental and somatic dysfunction of cervical region: Secondary | ICD-10-CM | POA: Diagnosis not present

## 2021-09-26 DIAGNOSIS — M9902 Segmental and somatic dysfunction of thoracic region: Secondary | ICD-10-CM | POA: Diagnosis not present

## 2021-09-26 DIAGNOSIS — M5413 Radiculopathy, cervicothoracic region: Secondary | ICD-10-CM | POA: Diagnosis not present

## 2021-09-26 DIAGNOSIS — M531 Cervicobrachial syndrome: Secondary | ICD-10-CM | POA: Diagnosis not present

## 2021-10-03 DIAGNOSIS — M9901 Segmental and somatic dysfunction of cervical region: Secondary | ICD-10-CM | POA: Diagnosis not present

## 2021-10-03 DIAGNOSIS — M5413 Radiculopathy, cervicothoracic region: Secondary | ICD-10-CM | POA: Diagnosis not present

## 2021-10-03 DIAGNOSIS — M531 Cervicobrachial syndrome: Secondary | ICD-10-CM | POA: Diagnosis not present

## 2021-10-03 DIAGNOSIS — M9902 Segmental and somatic dysfunction of thoracic region: Secondary | ICD-10-CM | POA: Diagnosis not present

## 2021-10-06 DIAGNOSIS — M9901 Segmental and somatic dysfunction of cervical region: Secondary | ICD-10-CM | POA: Diagnosis not present

## 2021-10-06 DIAGNOSIS — M5413 Radiculopathy, cervicothoracic region: Secondary | ICD-10-CM | POA: Diagnosis not present

## 2021-10-06 DIAGNOSIS — M531 Cervicobrachial syndrome: Secondary | ICD-10-CM | POA: Diagnosis not present

## 2021-10-06 DIAGNOSIS — M9902 Segmental and somatic dysfunction of thoracic region: Secondary | ICD-10-CM | POA: Diagnosis not present

## 2021-10-13 DIAGNOSIS — M9901 Segmental and somatic dysfunction of cervical region: Secondary | ICD-10-CM | POA: Diagnosis not present

## 2021-10-13 DIAGNOSIS — M531 Cervicobrachial syndrome: Secondary | ICD-10-CM | POA: Diagnosis not present

## 2021-10-13 DIAGNOSIS — M5413 Radiculopathy, cervicothoracic region: Secondary | ICD-10-CM | POA: Diagnosis not present

## 2021-10-13 DIAGNOSIS — M9902 Segmental and somatic dysfunction of thoracic region: Secondary | ICD-10-CM | POA: Diagnosis not present

## 2021-10-31 DIAGNOSIS — M5413 Radiculopathy, cervicothoracic region: Secondary | ICD-10-CM | POA: Diagnosis not present

## 2021-10-31 DIAGNOSIS — M9902 Segmental and somatic dysfunction of thoracic region: Secondary | ICD-10-CM | POA: Diagnosis not present

## 2021-10-31 DIAGNOSIS — M531 Cervicobrachial syndrome: Secondary | ICD-10-CM | POA: Diagnosis not present

## 2021-10-31 DIAGNOSIS — M9901 Segmental and somatic dysfunction of cervical region: Secondary | ICD-10-CM | POA: Diagnosis not present

## 2021-11-03 DIAGNOSIS — M531 Cervicobrachial syndrome: Secondary | ICD-10-CM | POA: Diagnosis not present

## 2021-11-03 DIAGNOSIS — M9901 Segmental and somatic dysfunction of cervical region: Secondary | ICD-10-CM | POA: Diagnosis not present

## 2021-11-03 DIAGNOSIS — M5413 Radiculopathy, cervicothoracic region: Secondary | ICD-10-CM | POA: Diagnosis not present

## 2021-11-03 DIAGNOSIS — M9902 Segmental and somatic dysfunction of thoracic region: Secondary | ICD-10-CM | POA: Diagnosis not present

## 2021-11-07 DIAGNOSIS — M9901 Segmental and somatic dysfunction of cervical region: Secondary | ICD-10-CM | POA: Diagnosis not present

## 2021-11-07 DIAGNOSIS — M9902 Segmental and somatic dysfunction of thoracic region: Secondary | ICD-10-CM | POA: Diagnosis not present

## 2021-11-07 DIAGNOSIS — M5413 Radiculopathy, cervicothoracic region: Secondary | ICD-10-CM | POA: Diagnosis not present

## 2021-11-07 DIAGNOSIS — M531 Cervicobrachial syndrome: Secondary | ICD-10-CM | POA: Diagnosis not present

## 2021-11-10 ENCOUNTER — Inpatient Hospital Stay (HOSPITAL_BASED_OUTPATIENT_CLINIC_OR_DEPARTMENT_OTHER): Payer: BC Managed Care – PPO | Admitting: Hematology

## 2021-11-10 ENCOUNTER — Inpatient Hospital Stay: Payer: BC Managed Care – PPO

## 2021-11-10 ENCOUNTER — Other Ambulatory Visit: Payer: Self-pay

## 2021-11-10 ENCOUNTER — Inpatient Hospital Stay: Payer: BC Managed Care – PPO | Attending: Hematology

## 2021-11-10 ENCOUNTER — Encounter: Payer: Self-pay | Admitting: Hematology

## 2021-11-10 VITALS — BP 130/82 | HR 84 | Temp 98.5°F | Resp 18 | Ht 62.0 in | Wt 139.1 lb

## 2021-11-10 DIAGNOSIS — Z9221 Personal history of antineoplastic chemotherapy: Secondary | ICD-10-CM | POA: Insufficient documentation

## 2021-11-10 DIAGNOSIS — Z79811 Long term (current) use of aromatase inhibitors: Secondary | ICD-10-CM | POA: Insufficient documentation

## 2021-11-10 DIAGNOSIS — C50212 Malignant neoplasm of upper-inner quadrant of left female breast: Secondary | ICD-10-CM | POA: Diagnosis not present

## 2021-11-10 DIAGNOSIS — Z923 Personal history of irradiation: Secondary | ICD-10-CM | POA: Diagnosis not present

## 2021-11-10 DIAGNOSIS — Z170 Estrogen receptor positive status [ER+]: Secondary | ICD-10-CM | POA: Insufficient documentation

## 2021-11-10 DIAGNOSIS — Z9013 Acquired absence of bilateral breasts and nipples: Secondary | ICD-10-CM | POA: Insufficient documentation

## 2021-11-10 DIAGNOSIS — D0511 Intraductal carcinoma in situ of right breast: Secondary | ICD-10-CM | POA: Diagnosis not present

## 2021-11-10 DIAGNOSIS — M8580 Other specified disorders of bone density and structure, unspecified site: Secondary | ICD-10-CM | POA: Diagnosis not present

## 2021-11-10 LAB — CMP (CANCER CENTER ONLY)
ALT: 15 U/L (ref 0–44)
AST: 18 U/L (ref 15–41)
Albumin: 4.4 g/dL (ref 3.5–5.0)
Alkaline Phosphatase: 63 U/L (ref 38–126)
Anion gap: 8 (ref 5–15)
BUN: 23 mg/dL — ABNORMAL HIGH (ref 6–20)
CO2: 29 mmol/L (ref 22–32)
Calcium: 9.2 mg/dL (ref 8.9–10.3)
Chloride: 104 mmol/L (ref 98–111)
Creatinine: 0.66 mg/dL (ref 0.44–1.00)
GFR, Estimated: >60 mL/min (ref >60)
Glucose, Bld: 98 mg/dL (ref 70–99)
Potassium: 4.2 mmol/L (ref 3.5–5.1)
Sodium: 141 mmol/L (ref 135–145)
Total Bilirubin: 0.4 mg/dL (ref 0.3–1.2)
Total Protein: 7.5 g/dL (ref 6.5–8.1)

## 2021-11-10 LAB — CBC WITH DIFFERENTIAL (CANCER CENTER ONLY)
Abs Immature Granulocytes: 0.01 10*3/uL (ref 0.00–0.07)
Basophils Absolute: 0.1 10*3/uL (ref 0.0–0.1)
Basophils Relative: 1 %
Eosinophils Absolute: 0.4 10*3/uL (ref 0.0–0.5)
Eosinophils Relative: 5 %
HCT: 39.1 % (ref 36.0–46.0)
Hemoglobin: 13.1 g/dL (ref 12.0–15.0)
Immature Granulocytes: 0 %
Lymphocytes Relative: 33 %
Lymphs Abs: 2.5 10*3/uL (ref 0.7–4.0)
MCH: 30 pg (ref 26.0–34.0)
MCHC: 33.5 g/dL (ref 30.0–36.0)
MCV: 89.5 fL (ref 80.0–100.0)
Monocytes Absolute: 0.5 10*3/uL (ref 0.1–1.0)
Monocytes Relative: 7 %
Neutro Abs: 4 10*3/uL (ref 1.7–7.7)
Neutrophils Relative %: 54 %
Platelet Count: 364 10*3/uL (ref 150–400)
RBC: 4.37 MIL/uL (ref 3.87–5.11)
RDW: 12.1 % (ref 11.5–15.5)
WBC Count: 7.5 10*3/uL (ref 4.0–10.5)
nRBC: 0 % (ref 0.0–0.2)

## 2021-11-10 NOTE — Progress Notes (Signed)
?[place]   ?[phone] [phone]   ?Clinic Follow up Note  ? ?Patient Care Team: ?Wenda Low, MD as PCP - General (Internal Medicine) ?Rockwell Germany, RN as Oncology Nurse Navigator ?Mauro Kaufmann, RN as Oncology Nurse Navigator ?Erroll Luna, MD as Consulting Physician (General Surgery) ?Truitt Merle, MD as Consulting Physician (Hematology) ?Gery Pray, MD as Consulting Physician (Radiation Oncology) ?Alla Feeling, NP as Nurse Practitioner (Nurse Practitioner) ? ?Date of Service:  11/10/2021 ? ?CHIEF COMPLAINT: f/u of left breast cancer ? ?CURRENT THERAPY:  ?Zometa every 6 months starting 11/27/19 ?Tamoxifen 90m daily starting 10/12/19. Switched to Anastrozole in 09/2020 ? ?ASSESSMENT & PLAN:  ?Chelsea HATTABAUGHis a 51y.o. post-menopausal (post-BSO) female with  ? ?1. Right breast DCIS, ER/PR+ ?-found on screening MRI 10/18/20. She opted to proceed with bilateral mastectomies on 12/26/20 under Dr. CBrantley Stagewith reconstruction by Dr. TIran Planas Pathology showed: focal DCIS, low to intermediate grade, with calcifications; lobular neoplasia. ER and PR both strongly positive. Left breast and right axillary lymph nodes were negative. ?-s/p BSO on 02/11/21. ?-she completed reconstruction on 04/01/21 under Dr. TIran Planas ?-she has been on anastrozole since 09/2020. She attempted to switch to exemestane in 04/2021 due to joint pain, but she had additional side effects on the exemestane and ultimately decided to switch back. She feels her joint pain is manageable. Plan to continue for at least 5 years, depending on her tolerance. ?-she is clinically doing well otherwise. Labs reviewed, overall WNL. Physical exam was unremarkable. There is no clinical concern for recurrence. ?  ?2. Malignant neoplasm of upper-inner quadrant of left breast, Stage IA, p(T1cN0M0), ER+/PR+, HER2+, Grade III ?-She was diagnosed in 03/2019. She underwent left breast lumpectomy and SLNB on 04/04/19.  ?-She  completed adjuvant Taxol/Herceptin, maintenance Herceptin, and adjuvant Radiation. ?-she started tamoxifen in 10/2019 and switched to anastrozole in 09/2020, will continue  ?  ?3. Osteopenia  ?-she began Zometa on 11/27/19 and received 4 doses.  ?-she reports her osteopenia was improved on most recent DEXA with her GYN. ?-she endorses taking vit D ?  ?  ?PLAN:  ?-continue anastrozole ?-lab and f/u with NP Lacie in 6 months ? ? ?No problem-specific Assessment & Plan notes found for this encounter. ? ? ?SUMMARY OF ONCOLOGIC HISTORY: ?Oncology History Overview Note  ?Cancer Staging ?Malignant neoplasm of upper-inner quadrant of left breast in female, estrogen receptor positive [place] ?Staging form: Breast, AJCC 8th Edition ?- Clinical stage from 03/22/2019: Stage IA (cT1c, cN0, cM0, G3, ER+, PR+, HER2+) - Signed by FTruitt Merle MD on 03/22/2019 ?Stage prefix: Initial diagnosis ?Histologic grading system: 3 grade system ?- Pathologic stage from 04/04/2019: Stage IA (pT1c, pN0, cM0, G3, ER+, PR+, HER2+) - Signed by FTruitt Merle MD on 04/21/2019 ?Stage prefix: Initial diagnosis ?Multigene prognostic tests performed: None ?Histologic grading system: 3 grade system ?Residual tumor (R): R0 - None ?- Pathologic: No stage assigned - Unsigned ? ?  ?Malignant neoplasm of upper-inner quadrant of left breast in female, estrogen receptor positive ([place]  ?03/10/2019 Mammogram  ? Diagnostic Mammogram 03/10/19  ?IMPRESSION: ?1. Suspicious mass in the 10 o'clock position of the left breast, 3cm from the nipple measuring 12x7x139m ?Biopsy is indicated. ?2. Indeterminate mass in the 5:30 o'clock position of the left ?breast, 5cm from the nipple, measuring 9x6x7m48mAlthough this may reflect a fibroadenoma, tissue sampling is ?recommended. ?  ?03/16/2019 Initial Biopsy  ? Diagnosis 03/16/19  ?1. Breast, left, needle core biopsy, 10 o'clock ?- INVASIVE  DUCTAL CARCINOMA, GRADE 3. SEE NOTE ?2. Breast, left, needle core biopsy, 5:30 o'clock ?- FIBROADENOMA ?   ?03/16/2019 Receptors her2  ? Results: ?IMMUNOHISTOCHEMICAL AND MORPHOMETRIC ANALYSIS PERFORMED MANUALLY ?The tumor cells are POSITIVE for Her2 (3+). ?Estrogen Receptor: 100%, POSITIVE, STRONG STAINING INTENSITY ?Progesterone Receptor: 100%, POSITIVE, STRONG STAINING INTENSITY ?Proliferation Marker Ki67: 60% ?  ?03/21/2019 Initial Diagnosis  ? Malignant neoplasm of upper-inner quadrant of left breast in female, estrogen receptor positive ([place]) ? ?  ?03/22/2019 Cancer Staging  ? Staging form: Breast, AJCC 8th Edition ?- Clinical stage from 03/22/2019: Stage IA (cT1c, cN0, cM0, G3, ER+, PR+, HER2+) - Signed by Truitt Merle, MD on 03/22/2019 ? ?  ?03/31/2019 Genetic Testing  ? VUS in CTNNA1 called [id number] (Intronic) was identified on the [facility] Common Hereditary Cancers Panel. The Common Hereditary Cancers Panel offered by [facility] includes sequencing and/or deletion duplication testing of the following 47 genes: APC, ATM, AXIN2, BARD1, BMPR1A, BRCA1, BRCA2, BRIP1, CDH1, CDKN2A (p14ARF), CDKN2A (p16INK4a), CKD4, CHEK2, CTNNA1, DICER1, EPCAM (Deletion/duplication testing only), GREM1 (promoter region deletion/duplication testing only), KIT, MEN1, MLH1, MSH2, MSH3, MSH6, MUTYH, NBN, NF1, NHTL1, PALB2, PDGFRA, PMS2, POLD1, POLE, PTEN, RAD50, RAD51C, RAD51D, SDHB, SDHC, SDHD, SMAD4, SMARCA4. STK11, TP53, TSC1, TSC2, and VHL.  The following genes were evaluated for sequence changes only: SDHA and HOXB13 c.251G>A variant only. The report date is 03/30/2019.  ?  ?03/31/2019 Breast MRI  ? MRI breast 03/31/19  ?IMPRESSION: ?1. 5 millimeter enhancing oval mass in the anteromedial aspect of ?the RIGHT breast warranting further evaluation. Given its anterior ?depth and visibility on noncontrast images, ultrasound is ?recommended to determine if there is a sonographic correlate. If no ?sonographic correlate is identified to guide biopsy, MRI biopsy of ?the RIGHT breast is recommended. ?2. Known malignancy in the [place] of the  LEFT breast ?measuring 1.0 centimeters. ?3. 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 ?  ?04/04/2019 Surgery  ? RADIOCATIVE SEED GUIDED LEFT BREAST LUMPECTOMY, LEFT AXILLARY SENTINEL LYMPH NODE BIOPSY and PAC placement  ?By Dr. Brantley Stage  ?04/04/19 ? ?  ?04/04/2019 Pathology Results  ? DIAGNOSIS: 04/04/19 ? ?A. BREAST, LEFT, LUMPECTOMY:  ?-  Invasive ductal carcinoma, Nottingham grade 3 of 3, 1.3 cm  ?-  Ductal carcinoma in-situ, intermediate grade  ?-  Margins uninvolved by carcinoma (0.1 cm; posterior margin; see  ?comment)  ?-  Previous biopsy site changes present  ?-  See oncology table and comment below  ? ?B. BREAST, LEFT, ADDITIONAL MEDIAL MARGIN, EXCISION:  ?-  Fibrocystic changes  ?-  No residual carcinoma identified  ? ?C. LYMPH NODE, LEFT AXILLARY, SENTINEL, BIOPSY:  ?-   No carcinoma identified in one lymph node (0/1)  ?  ?04/04/2019 Cancer Staging  ? Staging form: Breast, AJCC 8th Edition ?- Pathologic stage from 04/04/2019: Stage IA (pT1c, pN0, cM0, G3, ER+, PR+, HER2+) - Signed by Truitt Merle, MD on 04/21/2019 ? ?  ?04/11/2019 Pathology Results  ? Diagnosis ?Breast, right, needle core biopsy, 2 o'clock ?- SMALL INTRADUCTAL PAPILLOMA. SEE NOTE ?- SMALL FIBROADENOMA ?- NEGATIVE FOR CARCINOMA ?  ?05/12/2019 - 06/10/2020 Chemotherapy  ? Adjuvant Weekly Taxol and Herceptin for 12 weeks starting 05/12/19, switched to Abraxane on 08/04/19 due to skin rash. Completed on 08/11/19.  ?Followed by maintenance Herceptin q3weeks to complete 1 year treatment (from 05/12/19) starting 09/01/19-06/10/20 ?  ?08/29/2019 - 09/26/2019 Radiation Therapy  ? Adjuvant radiation per Dr. Sondra Come  ?  ?10/12/2019 -  Anti-estrogen oral therapy  ?  Tamoxifen 38m daily starting 10/12/19 ?  ?11/27/2019 Survivorship  ? SCP delivered by LCira Rue NP  ?  ?03/15/2020 Mammogram  ? IMPRESSION: ?1. Expected surgical changes at the lumpectomy site in the central ?posterior left breast. ?  ?2. No suspicious changes at the site of  the biopsied papilloma in ?the lower inner retroareolar right breast (ribbon clip). Surgical ?excision is planned for this lesion. ?  ?3.  No evidence of malignancy in the bilateral breasts. ?  ?04/17/2020 Su

## 2021-11-17 DIAGNOSIS — M9901 Segmental and somatic dysfunction of cervical region: Secondary | ICD-10-CM | POA: Diagnosis not present

## 2021-11-17 DIAGNOSIS — M5413 Radiculopathy, cervicothoracic region: Secondary | ICD-10-CM | POA: Diagnosis not present

## 2021-11-17 DIAGNOSIS — M531 Cervicobrachial syndrome: Secondary | ICD-10-CM | POA: Diagnosis not present

## 2021-11-17 DIAGNOSIS — M9902 Segmental and somatic dysfunction of thoracic region: Secondary | ICD-10-CM | POA: Diagnosis not present

## 2021-12-22 DIAGNOSIS — Z923 Personal history of irradiation: Secondary | ICD-10-CM | POA: Diagnosis not present

## 2021-12-22 DIAGNOSIS — Z9013 Acquired absence of bilateral breasts and nipples: Secondary | ICD-10-CM | POA: Diagnosis not present

## 2021-12-22 DIAGNOSIS — Z853 Personal history of malignant neoplasm of breast: Secondary | ICD-10-CM | POA: Diagnosis not present

## 2021-12-31 ENCOUNTER — Other Ambulatory Visit: Payer: Self-pay | Admitting: Hematology

## 2022-01-08 DIAGNOSIS — M9902 Segmental and somatic dysfunction of thoracic region: Secondary | ICD-10-CM | POA: Diagnosis not present

## 2022-01-08 DIAGNOSIS — M531 Cervicobrachial syndrome: Secondary | ICD-10-CM | POA: Diagnosis not present

## 2022-01-08 DIAGNOSIS — M5413 Radiculopathy, cervicothoracic region: Secondary | ICD-10-CM | POA: Diagnosis not present

## 2022-01-08 DIAGNOSIS — M9901 Segmental and somatic dysfunction of cervical region: Secondary | ICD-10-CM | POA: Diagnosis not present

## 2022-01-09 DIAGNOSIS — M9901 Segmental and somatic dysfunction of cervical region: Secondary | ICD-10-CM | POA: Diagnosis not present

## 2022-01-09 DIAGNOSIS — M5413 Radiculopathy, cervicothoracic region: Secondary | ICD-10-CM | POA: Diagnosis not present

## 2022-01-09 DIAGNOSIS — M531 Cervicobrachial syndrome: Secondary | ICD-10-CM | POA: Diagnosis not present

## 2022-01-09 DIAGNOSIS — M9902 Segmental and somatic dysfunction of thoracic region: Secondary | ICD-10-CM | POA: Diagnosis not present

## 2022-01-16 DIAGNOSIS — M5413 Radiculopathy, cervicothoracic region: Secondary | ICD-10-CM | POA: Diagnosis not present

## 2022-01-16 DIAGNOSIS — M9902 Segmental and somatic dysfunction of thoracic region: Secondary | ICD-10-CM | POA: Diagnosis not present

## 2022-01-16 DIAGNOSIS — M531 Cervicobrachial syndrome: Secondary | ICD-10-CM | POA: Diagnosis not present

## 2022-01-16 DIAGNOSIS — M9901 Segmental and somatic dysfunction of cervical region: Secondary | ICD-10-CM | POA: Diagnosis not present

## 2022-01-26 DIAGNOSIS — M531 Cervicobrachial syndrome: Secondary | ICD-10-CM | POA: Diagnosis not present

## 2022-01-26 DIAGNOSIS — M5413 Radiculopathy, cervicothoracic region: Secondary | ICD-10-CM | POA: Diagnosis not present

## 2022-01-26 DIAGNOSIS — M9901 Segmental and somatic dysfunction of cervical region: Secondary | ICD-10-CM | POA: Diagnosis not present

## 2022-01-26 DIAGNOSIS — M9902 Segmental and somatic dysfunction of thoracic region: Secondary | ICD-10-CM | POA: Diagnosis not present

## 2022-04-03 DIAGNOSIS — Z170 Estrogen receptor positive status [ER+]: Secondary | ICD-10-CM | POA: Diagnosis not present

## 2022-04-03 DIAGNOSIS — C50412 Malignant neoplasm of upper-outer quadrant of left female breast: Secondary | ICD-10-CM | POA: Diagnosis not present

## 2022-04-12 ENCOUNTER — Other Ambulatory Visit: Payer: Self-pay | Admitting: Hematology

## 2022-05-17 NOTE — Progress Notes (Unsigned)
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   SUMMARY OF ONCOLOGIC HISTORY: Oncology History Overview Note  Cancer Staging Malignant neoplasm of upper-inner quadrant of left breast in female, estrogen receptor positive ([place]) Staging form: Breast, AJCC 8th Edition - Clinical stage from 03/22/2019: Stage IA (cT1c, cN0, cM0, G3, ER+, PR+, HER2+) - Signed by Truitt Merle, MD on 03/22/2019 Stage prefix: Initial diagnosis Histologic grading system: 3 grade system - Pathologic stage from 04/04/2019: Stage IA (pT1c, pN0, cM0, G3, ER+, PR+, HER2+) - Signed by Truitt Merle, MD on 04/21/2019 Stage prefix: Initial diagnosis Multigene prognostic tests performed: None Histologic grading system: 3 grade system Residual tumor (R): R0 - None - Pathologic: No stage assigned - Unsigned    Malignant neoplasm of upper-inner quadrant of left breast in female, estrogen receptor positive ([place])  03/10/2019 Mammogram   Diagnostic Mammogram 03/10/19  IMPRESSION: 1. Suspicious mass in the 10 o'clock position of the left breast, 3cm from the nipple measuring 12x7x79m. Biopsy is indicated. 2. Indeterminate mass in the 5:30 o'clock position of the left breast, 5cm from the nipple, measuring 9x6x731m Although this may reflect a fibroadenoma, tissue sampling is recommended.   03/16/2019 Initial Biopsy   Diagnosis 03/16/19  1. Breast, left, needle core biopsy, 10 o'clock - INVASIVE  DUCTAL CARCINOMA, GRADE 3. SEE NOTE 2. Breast, left, needle core biopsy, 5:30 o'clock - FIBROADENOMA   03/16/2019 Receptors her2   Results: IMMUNOHISTOCHEMICAL AND MORPHOMETRIC ANALYSIS PERFORMED MANUALLY The tumor cells are POSITIVE for Her2 (3+). Estrogen Receptor: 100%, POSITIVE, STRONG STAINING INTENSITY Progesterone Receptor: 100%, POSITIVE, STRONG STAINING INTENSITY Proliferation Marker Ki67: 60%   03/21/2019 Initial Diagnosis   Malignant neoplasm of upper-inner quadrant of left breast in female, estrogen receptor positive ([place]  03/22/2019 Cancer Staging   Staging form: Breast, AJCC 8th Edition - Clinical stage from 03/22/2019: Stage IA (cT1c, cN0, cM0, G3, ER+, PR+, HER2+) - Signed by FeTruitt MerleMD on 03/22/2019   03/31/2019 Genetic Testing   VUS in CTNNA1 called [id number] (Intronic) was identified on the [employer] Common Hereditary Cancers Panel. The Common Hereditary Cancers Panel offered by [facility] includes sequencing and/or deletion duplication testing of the following 47 genes: APC, ATM, AXIN2, BARD1, BMPR1A, BRCA1, BRCA2, BRIP1, CDH1, CDKN2A (p14ARF), CDKN2A (p16INK4a), CKD4, CHEK2, CTNNA1, DICER1, EPCAM (Deletion/duplication testing only), GREM1 (promoter region deletion/duplication testing only), KIT, MEN1, MLH1, MSH2, MSH3, MSH6, MUTYH, NBN, NF1, NHTL1, PALB2, PDGFRA, PMS2, POLD1, POLE, PTEN, RAD50, RAD51C, RAD51D, SDHB, SDHC, SDHD, SMAD4, SMARCA4. STK11, TP53, TSC1, TSC2, and VHL.  The following genes were evaluated for sequence changes only: SDHA and HOXB13 c.251G>A variant only. The report date is 03/30/2019.    03/31/2019 Breast MRI   MRI breast 03/31/19  IMPRESSION: 1. 5 millimeter enhancing oval mass in the anteromedial aspect of the RIGHT breast warranting further evaluation. Given its anterior depth and visibility on noncontrast images, ultrasound is recommended to determine if there is a sonographic correlate. If no sonographic correlate is identified to guide biopsy, MRI  biopsy of the RIGHT  breast is recommended. 2. Known malignancy in the UPPER INNER QUADRANT of the LEFT breast measuring 1.0 centimeters. 3. 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   04/04/2019 Surgery   RADIOCATIVE SEED GUIDED LEFT BREAST LUMPECTOMY, LEFT AXILLARY SENTINEL LYMPH NODE BIOPSY and PAC placement  By Dr. Brantley Stage  04/04/19    04/04/2019 Pathology Results   DIAGNOSIS: 04/04/19  A. BREAST, LEFT, LUMPECTOMY:  -  Invasive ductal carcinoma, Nottingham grade 3 of 3, 1.3 cm  -  Ductal carcinoma in-situ, intermediate grade  -  Margins uninvolved by carcinoma (0.1 cm; posterior margin; see  comment)  -  Previous biopsy site changes present  -  See oncology table and comment below   B. BREAST, LEFT, ADDITIONAL MEDIAL MARGIN, EXCISION:  -  Fibrocystic changes  -  No residual carcinoma identified   C. LYMPH NODE, LEFT AXILLARY, SENTINEL, BIOPSY:  -   No carcinoma identified in one lymph node (0/1)    04/04/2019 Cancer Staging   Staging form: Breast, AJCC 8th Edition - Pathologic stage from 04/04/2019: Stage IA (pT1c, pN0, cM0, G3, ER+, PR+, HER2+) - Signed by Truitt Merle, MD on 04/21/2019   04/11/2019 Pathology Results   Diagnosis Breast, right, needle core biopsy, 2 o'clock - SMALL INTRADUCTAL PAPILLOMA. SEE NOTE - SMALL FIBROADENOMA - NEGATIVE FOR CARCINOMA   05/12/2019 - 06/10/2020 Chemotherapy   Adjuvant Weekly Taxol and Herceptin for 12 weeks starting 05/12/19, switched to Abraxane on 08/04/19 due to skin rash. Completed on 08/11/19.  Followed by maintenance Herceptin q3weeks to complete 1 year treatment (from 05/12/19) starting 09/01/19-06/10/20   08/29/2019 - 09/26/2019 Radiation Therapy   Adjuvant radiation per Dr. Sondra Come    10/12/2019 -  Anti-estrogen oral therapy   Tamoxifen 40m daily starting 10/12/19   11/27/2019 Survivorship   SCP delivered by LCira Rue NP    03/15/2020 Mammogram   IMPRESSION: 1. Expected surgical changes at the  lumpectomy site in the central posterior left breast.   2. No suspicious changes at the site of the biopsied papilloma in the lower inner retroareolar right breast (ribbon clip). Surgical excision is planned for this lesion.   3.  No evidence of malignancy in the bilateral breasts.   04/17/2020 Surgery   RIGHT BREAST LUMPECTOMY WITH RADIOACTIVE SEED LOCALIZATION and PAC removal by Dr CBrantley Stage  FINAL MICROSCOPIC DIAGNOSIS:   A. BREAST, RIGHT, LUMPECTOMY:  - Biopsy clip.  Fibroadenoma.  Fibroadenomatoid change.  Fibrocystic  change.       CURRENT THERAPY:  -Zometa every 6 months starting 11/27/19 -Tamoxifen 297mdaily starting 10/12/19. Switched to Anastrozole in 09/2020  INTERVAL HISTORY: Ms. DePichoneturns for follow up as scheduled, last seen by Dy. Feng 11/10/21. She recently followed up with Dr. CoBrantley Stage/22/23. She continues anastrozole.  Joint pain in her feet, knees, and pelvic area have improved but her hand stiffness and finger weakness remains present.  He finds it difficult to work now, as a deCopywriter, advertisingand gripped the instruments.  Her goal is to continue working.  She is also more anxious and tense lately, feels on edge and cannot relax.  Over the weekend off work took Xanax twice a day which helped but she does not want this to be a long-term solution.  Children live with her and her mother recently moved in with her, she has situational stressors.  All other systems were reviewed with the patient and are negative.  MEDICAL HISTORY:  Past Medical History:  Diagnosis Date  Chemotherapy-induced neuropathy ([place])    per pt effects hands, legs, and feet   Family history of breast cancer    Family history of lung cancer    History of 2019 novel coronavirus disease (COVID-19) 07/17/2019   positive result in care everywhere, mild symptoms that resolved   History of colitis    followed by dr Linna Darner (GI)--- per pt dx age 61s , residual scarring, take lialda daily   History  of right breast cancer 10/2020   followed by oncology---  04-17-2020 s/p right breast lumpectomy, fibroadema;  dx 04/ 2022 w/ DCIS right breast  s/p right mastectomy with node bx 12-26-2020 dx DCIS stage 0 (no chemo/ radiation)   Malignant neoplasm of upper-inner quadrant of left breast in female, estrogen receptor positive ([place]) 03/2019   oncologist--- dr y. Burr Medico;  dx left breast IDC w/ DCIS, Stage IA,  04-04-2019 s/p left breast lumpectomy with node bx;  completed chemo 06-10-2020 and radiation 09-26-2019;  ;  12-26-2020 s/p bilateral  mastectomy with reconstruction with right node bx;  right breast DCIS dx 04/ 2022   Mild asthma    followed by pcp   Personal history of chemotherapy    05-12-2019  to 06-10-2020, left breast cancer   Personal history of radiation therapy    08-29-2019  to  09-26-2019,  left breast cancer    SURGICAL HISTORY: Past Surgical History:  Procedure Laterality Date   BREAST LUMPECTOMY Left 04/04/2019   BREAST LUMPECTOMY WITH RADIOACTIVE SEED AND SENTINEL LYMPH NODE BIOPSY Left 04/04/2019   Procedure: RADIOCATIVE SEED GUIDED LEFT BREAST LUMPECTOMY, LEFT AXILLARY SENTINEL LYMPH NODE BIOPSY;  Surgeon: Erroll Luna, MD;  Location: [place];  Service: General;  Laterality: Left;   BREAST LUMPECTOMY WITH RADIOACTIVE SEED LOCALIZATION Right 04/17/2020   Procedure: RIGHT BREAST LUMPECTOMY WITH RADIOACTIVE SEED LOCALIZATION;  Surgeon: Erroll Luna, MD;  Location: [place];  Service: General;  Laterality: Right;   BREAST RECONSTRUCTION WITH PLACEMENT OF TISSUE EXPANDER AND ALLODERM Right 12/26/2020   Procedure: RIGHT BREAST RECONSTRUCTION WITH PLACEMENT OF TISSUE EXPANDER AND ALLODERM;  Surgeon: Irene Limbo, MD;  Location: [place];  Service: Plastics;  Laterality: Right;   COLONOSCOPY  2021   LAPAROSCOPIC SALPINGO OOPHERECTOMY Bilateral 02/11/2021   Procedure: LAPAROSCOPIC BILATERAL SALPINGO OOPHORECTOMY;  Surgeon: [name], MD;  Location[facility];  Service: Gynecology;  Laterality: Bilateral;   LATISSIMUS FLAP TO BREAST Left 12/26/2020   Procedure: LEFT BREAST RECONSTRUCTION WITH LATISSIMUS FLAP AND PLACEMENT OF TISSUE EXPANDER;  Surgeon: Irene Limbo, MD;  Location: [place];  Service: Plastics;  Laterality: Left;   LIPOSUCTION WITH LIPOFILLING Bilateral 04/01/2021   Procedure: LIPOFILLING BILATERAL CHEST;  Surgeon: Irene Limbo, MD;  Location: [place];  Service: Plastics;  Laterality: Bilateral;   NASAL SINUS SURGERY Bilateral 2009   NIPPLE SPARING MASTECTOMY Bilateral 12/26/2020   Procedure: BILATERAL SKIN SPARING MASTECTOMIES;  Surgeon: Erroll Luna, MD;  Location: [place];  Service: General;  Laterality: Bilateral;   PORT-A-CATH REMOVAL Right 04/17/2020   Procedure: REMOVAL PORT-A-CATH;  Surgeon: Erroll Luna, MD;  Location: [place];  Service: General;  Laterality: Right;   PORTACATH PLACEMENT N/A 04/04/2019   Procedure: INSERTION PORT-A-CATH WITH ULTRASOUND;  Surgeon: Erroll Luna, MD;  Location: [place];  Service: General;  Laterality: N/A;   REMOVAL OF BILATERAL TISSUE EXPANDERS WITH PLACEMENT OF BILATERAL BREAST IMPLANTS Bilateral 04/01/2021   Procedure: REMOVAL OF BILATERAL TISSUE EXPANDERS WITH PLACEMENT OF BILATERAL  BREAST SILICONE IMPLANTS;  Surgeon: Irene Limbo, MD;  Location: [place];  Service: Plastics;  Laterality: Bilateral;   SENTINEL NODE BIOPSY Right 12/26/2020   Procedure: RIGHT AXILLARY SENTINEL LYMPH NODE BIOPSY;  Surgeon: Erroll Luna, MD;  Location: [place];  Service: General;  Laterality: Right;   WISDOM TOOTH EXTRACTION Bilateral     I have reviewed the social history and family history with the patient and they are unchanged from previous note.  ALLERGIES:  is allergic to gadolinium derivatives, latex, tetracycline hcl, and wound dressing adhesive.  MEDICATIONS:   Current Outpatient Medications  Medication Sig Dispense Refill   albuterol (PROVENTIL HFA;VENTOLIN HFA) 108 (90 BASE) MCG/ACT inhaler Inhale 2 puffs into the lungs every 6 (six) hours as needed for shortness of breath.     ALPRAZolam (XANAX) 0.5 MG tablet Take 0.25 mg by mouth 2 (two) times daily as needed for anxiety.     anastrozole (ARIMIDEX) 1 MG tablet TAKE 1 TABLET BY MOUTH EVERY DAY 90 tablet 1   Ascorbic Acid (VITAMIN C) 1000 MG tablet Take 2,000 mg by mouth daily. Lysine     Calcium-Magnesium-Vitamin D (CALCIUM MAGNESIUM PO) Take 1,200 mg by mouth daily.     Cholecalciferol (VITAMIN D) 125 MCG (5000 UT) CAPS Take 5,000 Units by mouth daily.     docusate sodium (COLACE) 100 MG capsule Take 1 capsule (100 mg total) by mouth 2 (two) times daily. 60 capsule 2   exemestane (AROMASIN) 25 MG tablet Take 1 tablet (25 mg total) by mouth daily after breakfast. 30 tablet 3   fexofenadine (ALLEGRA) 180 MG tablet Take 180 mg by mouth daily as needed for allergies.     fluticasone (FLONASE) 50 MCG/ACT nasal spray Place 1 spray into both nostrils daily as needed for allergies.     gabapentin (NEURONTIN) 100 MG capsule TAKE 2 CAPSULES BY MOUTH AT BEDTIME. 60 capsule 6   ibuprofen (ADVIL) 600 MG tablet Take 1 tablet (600 mg total) by mouth every 6 (six) hours as needed. 30 tablet 0   mesalamine (LIALDA) 1.2 g EC tablet Take 1.2 g by mouth at bedtime.     methocarbamol (ROBAXIN) 500 MG tablet Take 500 mg by mouth 3 (three) times daily as needed.     montelukast (SINGULAIR) 10 MG tablet Take 1 tablet by mouth at bedtime.     Multiple Vitamins-Minerals (MULTIVITAMIN WITH MINERALS) tablet Take 1 tablet by mouth daily.     Omega-3 1000 MG CAPS Take 1,000 mg by mouth daily.     oxyCODONE (OXY IR/ROXICODONE) 5 MG immediate release tablet Take 1 tablet (5 mg total) by mouth every 4 (four) hours as needed for severe pain. 15 tablet 0   SUMAtriptan (IMITREX) 100 MG tablet Take 100 mg by mouth every 2 (two) hours  as needed for migraine.     venlafaxine XR (EFFEXOR-XR) 75 MG 24 hr capsule Take 1 capsule (75 mg total) by mouth daily with breakfast. 30 capsule 6   Zinc 25 MG TABS Take 25 mg by mouth daily.     No current facility-administered medications for this visit.    PHYSICAL EXAMINATION: ECOG PERFORMANCE STATUS: 1 - Symptomatic but completely ambulatory  Vitals:   05/18/22 0906  BP: (!) 129/90  Pulse: 87  Resp: 16  Temp: 98 F (36.7 C)  SpO2: 100%   Filed Weights   05/18/22 0906  Weight: 136 lb 14.4 oz (62.1 kg)    GENERAL:alert, no distress and comfortable SKIN: No rash  EYES: sclera clear NECK: Without mass LYMPH:  no palpable cervical or supraclavicular lymphadenopathy LUNGS:  normal breathing effort HEART:  no lower extremity edema NEURO: alert & oriented x 3 with fluent speech, no focal motor/sensory deficits Breast exam: Status post bilateral mastectomies, incisions completely healed.  No palpable mass or nodularity bilaterally at the scars, chest wall, implants, or either axilla that I could appreciate.   LABORATORY DATA:  I have reviewed the data as listed    Latest Ref Rng & Units 05/18/2022    8:49 AM 11/10/2021    8:33 AM 05/19/2021    8:16 AM  CBC  WBC 4.0 - 10.5 K/uL 8.5  7.5  7.9   Hemoglobin 12.0 - 15.0 g/dL 13.4  13.1  12.8   Hematocrit 36.0 - 46.0 % 38.9  39.1  36.9   Platelets 150 - 400 K/uL 347  364  352         Latest Ref Rng & Units 05/18/2022    8:49 AM 11/10/2021    8:33 AM 05/19/2021    8:16 AM  CMP  Glucose 70 - 99 mg/dL 93  98  84   BUN 6 - 20 mg/dL _0 Creatinine 0.44 - 1.00 mg/dL 0.76  0.66  0.75   Sodium 135 - 145 mmol/L 140  141  142   Potassium 3.5 - 5.1 mmol/L 4.0  4.2  3.5   Chloride 98 - 111 mmol/L 103  104  106   CO2 22 - 32 mmol/L _1 Calcium 8.9 - 10.3 mg/dL 9.2  9.2  9.3   Total Protein 6.5 - 8.1 g/dL 7.4  7.5  7.3   Total Bilirubin 0.3 - 1.2 mg/dL 0.4  0.4  0.3   Alkaline Phos 38 - 126 U/L 70  63  71   AST  15 - 41 U/L _2 ALT 0 - 44 U/L _3 RADIOGRAPHIC STUDIES: I have personally reviewed the radiological images as listed and agreed with the findings in the report. No results found.   ASSESSMENT & PLAN: Chelsea Hobbs is a 51 y.o. female with   Right breast DCIS, ER/PR positive -Found on screening MRI 10/18/2020, s/p bilateral mastectomies by Dr. Brantley Stage 12/26/2020  -Path showed focal DCIS, low to intermediate grade with calcifications, and lobular neoplasia.  ER/PR both strongly positive.  Right axillary and left breast lymph nodes were negative -S/p BSO 02/11/2021 -S/P reconstruction by Dr. Iran Planas 04/01/2021 -She has been on anastrozole since 09/2020, attempted to switch to exemestane in 04/2021 due to joint pain but only tried for several days and she switched back.  See #3 below -Goal for AI is at least 5 years, pending her tolerance -Ms. Judon is clinically doing well.  Breast exam is benign, labs are normal.  No role for mammograms given bilateral mastectomy.  No clinical concern for recurrence. -Continue surveillance   Malignant neoplasm of upper-inner quadrant of left breast, Stage IA, p(T1cN0M0), ER+/PR+, HER2+, Grade III -Diagnosed in 03/2019. S/p left breast lumpectomy and SLNB on 04/04/19. Her DCIS margins were close (80m), S/p adjuvant chemo with weekly Taxol (changed to Abraxane due to skin rash), and 1 year of Herceptin therapy, and adjuvant radiation by Dr. KSondra Come-She began tamoxifen in 10/2019 and changed to anastrozole in 09/2020.  See #3 -Today's exam is benign and labs are normal, no  concern for recurrence.   -Continue surveillance    3. Joint pain, hand weakness -She began anastrozole in 09/2020, developed joint pain mainly her feet, knees, and hips/groin area and tried to switch to exemestane but did not tolerate it after a few days -Switch back to anastrozole.  Over time her lower extremity joint pain resolved -She has persistent stiffness in  her hands and decreased grip strength making it difficult to work, she would like to try exemestane again  -I recommend to hold anastrozole this week then start exemestane next week - If she has unmanageable side effects with her hands she will stop exemestane.  May try letrozole at that time. -I discussed tamoxifen, she knows the efficacy is slightly lower than AI and would like to hold off for now -We will send me a MyChart message to let me know how she is doing  4. Anxiety, insomnia  -Pre-existing history of anxiety, managed by PCP on Effexor and Xanax  -Insomnia worsened during chemo  -She has met with our social work department in the past  -She has more anxiety now, likely multifactorial including AI and situational from changes at home.  -I recommend to increase Effexor to 75 mg p.o. once daily, and Xanax as needed for acute/severe anxiety -She agrees with the plan.  5. Bone Health  -S/p Zometa starting 11/27/2019 every 6 months x4 doses -Per patient on last DEXA osteoporosis resolved -Continue calcium, vitamin D, and weightbearing exercise   6. Genetic Testing was negative for pathogenetic nutations   7 health maintenance  -She continues exercise and healthy active lifestyle -She anticipates scheduling a screening colonoscopy in the next few months   Plan: -Labs reviewed -Due to worsening joint pain and decreased grip strength in her hands, stop anastrozole  -Retry exemestane starting next week, Pt will communicate via MyChart to let us know how she tolerates it -She does not tolerate exemestane then she will stop, may try letrozole or tamoxifen at that time -Increase Effexor to 75 mg p.o. once daily for anxiety -Follow-up in 6 months, or sooner if needed   All questions were answered. The patient knows to call the clinic with any problems, questions or concerns. No barriers to learning was detected. I spent 20 minutes counseling the patient face to face. The total time  spent in the appointment was 30 minutes and more than 50% was on counseling and review of test results.      Alla Feeling, NP 05/18/22

## 2022-05-18 ENCOUNTER — Inpatient Hospital Stay: Payer: BC Managed Care – PPO

## 2022-05-18 ENCOUNTER — Inpatient Hospital Stay: Payer: BC Managed Care – PPO | Attending: Nurse Practitioner | Admitting: Nurse Practitioner

## 2022-05-18 ENCOUNTER — Encounter: Payer: Self-pay | Admitting: Nurse Practitioner

## 2022-05-18 VITALS — BP 129/90 | HR 87 | Temp 98.0°F | Resp 16 | Ht 62.0 in | Wt 136.9 lb

## 2022-05-18 DIAGNOSIS — D0511 Intraductal carcinoma in situ of right breast: Secondary | ICD-10-CM | POA: Insufficient documentation

## 2022-05-18 DIAGNOSIS — Z170 Estrogen receptor positive status [ER+]: Secondary | ICD-10-CM | POA: Diagnosis not present

## 2022-05-18 DIAGNOSIS — Z79811 Long term (current) use of aromatase inhibitors: Secondary | ICD-10-CM | POA: Diagnosis not present

## 2022-05-18 DIAGNOSIS — G4700 Insomnia, unspecified: Secondary | ICD-10-CM | POA: Diagnosis not present

## 2022-05-18 DIAGNOSIS — Z79899 Other long term (current) drug therapy: Secondary | ICD-10-CM | POA: Insufficient documentation

## 2022-05-18 DIAGNOSIS — Z9013 Acquired absence of bilateral breasts and nipples: Secondary | ICD-10-CM | POA: Diagnosis not present

## 2022-05-18 DIAGNOSIS — F419 Anxiety disorder, unspecified: Secondary | ICD-10-CM | POA: Insufficient documentation

## 2022-05-18 DIAGNOSIS — C50212 Malignant neoplasm of upper-inner quadrant of left female breast: Secondary | ICD-10-CM

## 2022-05-18 LAB — CBC WITH DIFFERENTIAL (CANCER CENTER ONLY)
Abs Immature Granulocytes: 0.02 10*3/uL (ref 0.00–0.07)
Basophils Absolute: 0.1 10*3/uL (ref 0.0–0.1)
Basophils Relative: 1 %
Eosinophils Absolute: 0.5 10*3/uL (ref 0.0–0.5)
Eosinophils Relative: 5 %
HCT: 38.9 % (ref 36.0–46.0)
Hemoglobin: 13.4 g/dL (ref 12.0–15.0)
Immature Granulocytes: 0 %
Lymphocytes Relative: 27 %
Lymphs Abs: 2.3 10*3/uL (ref 0.7–4.0)
MCH: 30.7 pg (ref 26.0–34.0)
MCHC: 34.4 g/dL (ref 30.0–36.0)
MCV: 89.2 fL (ref 80.0–100.0)
Monocytes Absolute: 0.5 10*3/uL (ref 0.1–1.0)
Monocytes Relative: 6 %
Neutro Abs: 5.2 10*3/uL (ref 1.7–7.7)
Neutrophils Relative %: 61 %
Platelet Count: 347 10*3/uL (ref 150–400)
RBC: 4.36 MIL/uL (ref 3.87–5.11)
RDW: 12.3 % (ref 11.5–15.5)
WBC Count: 8.5 10*3/uL (ref 4.0–10.5)
nRBC: 0 % (ref 0.0–0.2)

## 2022-05-18 LAB — CMP (CANCER CENTER ONLY)
ALT: 24 U/L (ref 0–44)
AST: 20 U/L (ref 15–41)
Albumin: 4.5 g/dL (ref 3.5–5.0)
Alkaline Phosphatase: 70 U/L (ref 38–126)
Anion gap: 7 (ref 5–15)
BUN: 19 mg/dL (ref 6–20)
CO2: 30 mmol/L (ref 22–32)
Calcium: 9.2 mg/dL (ref 8.9–10.3)
Chloride: 103 mmol/L (ref 98–111)
Creatinine: 0.76 mg/dL (ref 0.44–1.00)
GFR, Estimated: >60 mL/min (ref >60)
Glucose, Bld: 93 mg/dL (ref 70–99)
Potassium: 4 mmol/L (ref 3.5–5.1)
Sodium: 140 mmol/L (ref 135–145)
Total Bilirubin: 0.4 mg/dL (ref 0.3–1.2)
Total Protein: 7.4 g/dL (ref 6.5–8.1)

## 2022-05-18 MED ORDER — VENLAFAXINE HCL ER 75 MG PO CP24: 75.0000 mg | ORAL_CAPSULE | Freq: Every day | ORAL | 6 refills | Status: DC

## 2022-06-15 ENCOUNTER — Other Ambulatory Visit: Payer: Self-pay | Admitting: Hematology

## 2022-06-29 DIAGNOSIS — Z13220 Encounter for screening for lipoid disorders: Secondary | ICD-10-CM | POA: Diagnosis not present

## 2022-06-29 DIAGNOSIS — R251 Tremor, unspecified: Secondary | ICD-10-CM | POA: Diagnosis not present

## 2022-06-29 DIAGNOSIS — Z0000 Encounter for general adult medical examination without abnormal findings: Secondary | ICD-10-CM | POA: Diagnosis not present

## 2022-06-30 ENCOUNTER — Other Ambulatory Visit: Payer: Self-pay | Admitting: Hematology

## 2022-06-30 DIAGNOSIS — G620 Drug-induced polyneuropathy: Secondary | ICD-10-CM

## 2022-07-01 ENCOUNTER — Encounter: Payer: Self-pay | Admitting: Neurology

## 2022-07-01 ENCOUNTER — Encounter: Payer: Self-pay | Admitting: Hematology

## 2022-07-02 ENCOUNTER — Other Ambulatory Visit: Payer: Self-pay | Admitting: Hematology

## 2022-07-02 DIAGNOSIS — G620 Drug-induced polyneuropathy: Secondary | ICD-10-CM

## 2022-07-03 ENCOUNTER — Other Ambulatory Visit: Payer: Self-pay | Admitting: Hematology

## 2022-07-03 ENCOUNTER — Other Ambulatory Visit: Payer: Self-pay

## 2022-07-03 DIAGNOSIS — G620 Drug-induced polyneuropathy: Secondary | ICD-10-CM

## 2022-07-03 MED ORDER — GABAPENTIN 100 MG PO CAPS: 200.0000 mg | ORAL_CAPSULE | Freq: Every day | ORAL | 6 refills | Status: DC

## 2022-07-07 DIAGNOSIS — J111 Influenza due to unidentified influenza virus with other respiratory manifestations: Secondary | ICD-10-CM | POA: Diagnosis not present

## 2022-07-07 DIAGNOSIS — R0981 Nasal congestion: Secondary | ICD-10-CM | POA: Diagnosis not present

## 2022-07-07 DIAGNOSIS — J45909 Unspecified asthma, uncomplicated: Secondary | ICD-10-CM | POA: Diagnosis not present

## 2022-07-07 DIAGNOSIS — R509 Fever, unspecified: Secondary | ICD-10-CM | POA: Diagnosis not present

## 2022-07-08 ENCOUNTER — Other Ambulatory Visit: Payer: Self-pay | Admitting: Hematology

## 2022-07-16 NOTE — Progress Notes (Signed)
Assessment/Plan:   Parkinsonism.  I suspect that this does represent idiopathic Parkinson's disease.  The patient has tremor, bradykinesia, rigidity  -We discussed the diagnosis as well as pathophysiology of the disease.  We discussed treatment options as well as prognostic indicators.  Patient education was provided.  -Greater than 50% of the 60 minute visit was spent in counseling answering questions and talking about what to expect now as well as in the future.  We talked about medication options as well as potential future surgical options.  We talked about safety in the home.  -We decided to add carbidopa/levodopa 25/100.  1/2 tab tid x 1 wk, then 1/2 in am & noon & 1 at night for a week, then 1/2 in am &1 at noon &night for a week, then 1 po tid.  Risks, benefits, side effects and alternative therapies were discussed.  The opportunity to ask questions was given and they were answered to the best of my ability.  The patient expressed understanding and willingness to follow the outlined treatment protocols.  -sent LCSW info on pt for THRIVE at pt request  -We discussed community resources in the area including patient support groups and community exercise programs for PD and pt education was provided to the patient.  -we will do mri brain given hx of breast CA  -discussed adjustment to dx counseling  -2nd opinion offered but declined  GAD  -on effexor  -some may be due to #1 and some may be feeling of internal tremor as well  Hyperreflexia  -likely physiologic but as above, mri brain  Subjective:   Chelsea Hobbs was seen in consultation in the movement disorder clinic at the request of Wenda Low, MD.  The evaluation is for tremor.  Tremor started approximately 4 years ago.  It started when she first started chemo, as an occasional "shock" in her arm or leg.  However, over the last 6 months months she noted more consistent tremor in the R arm.  Tremor is most noticeable when she is  at work.  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   However, she can note it at rest as well.  Anxiety does make it worse.  Its also worse after use.   It is better after exercise.  There is no family hx of tremor.    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  She underwent lumpectomy in 2020, but had a screening MRI in April, 2022 and subsequent biopsy, that demonstrated DCIS.  Patient subsequently underwent bilateral mastectomy in June, 2022.  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  taking xanax for tremor at work  Affected by caffeine:  Yes.   (And she did cut back alcohol to accommodate) Affected by alcohol:  unknown (2-3/month) Affected by stress:  Yes.   Affected by fatigue:  Yes.   Spills soup if on spoon:  No. Spills glass of liquid if full:  No. Affects ADL's (tying shoes, brushing teeth, etc):  No.  Current/Previously tried tremor medications: xanax  Current medications that may exacerbate tremor:  Singulair, albuterol (only takes rarely - last took 2 weeks ago when had flu)  Voice: no change Sleep: some trouble getting to sleep  Vivid Dreams:  No.  Acting out dreams:  No. Wet Pillows: No. Postural symptoms:  not as good after chemo - better if goes to gym  Falls?  No. Bradykinesia symptoms: no bradykinesia noted Loss of smell:  Yes.  , gone for years Loss of taste:  No. Urinary Incontinence:  little stress incontinence Difficulty Swallowing:  No. Handwriting, micrographia: Yes.   Memory changes:  No. Hallucinations:  No.  visual distortions: No. N/V:  No. Lightheaded:  No.  Syncope: No. Diplopia:  No. Dyskinesia:  No.   Outside reports reviewed: historical medical records, office notes, and referral letter/letters.  Allergies  Allergen Reactions   Gadolinium Derivatives Hives and Other (See Comments)    Pt started wheezing after  contrast/ runny nose/ 1 hive on arm/ Dr Tery Sanfilippo checked pt condition/ pt has an inhaler so used her inhaler and felt better/ no medication given / pt was instructed to wait at [facility] for 30 min to be reassessed by Dr.//jv    Latex Dermatitis   Tetracycline Hcl Diarrhea   Wound Dressing Adhesive Rash    Dermabond    Current Meds  Medication Sig   albuterol (PROVENTIL HFA;VENTOLIN HFA) 108 (90 BASE) MCG/ACT inhaler Inhale 2 puffs into the lungs every 6 (six) hours as needed for shortness of breath.   ALPRAZolam (XANAX) 0.5 MG tablet Take 0.25 mg by mouth 2 (two) times daily as needed for anxiety.   Ascorbic Acid (VITAMIN C) 1000 MG tablet Take 2,000 mg by mouth daily. Lysine   Calcium-Magnesium-Vitamin D (CALCIUM MAGNESIUM PO) Take 1,200 mg by mouth daily.   Cholecalciferol (VITAMIN D) 125 MCG (5000 UT) CAPS Take 5,000 Units by mouth daily.   docusate sodium (COLACE) 100 MG capsule Take 1 capsule (100 mg total) by mouth 2 (two) times daily.   exemestane (AROMASIN) 25 MG tablet TAKE 1 TABLET (25 MG TOTAL) BY MOUTH DAILY AFTER BREAKFAST.   fexofenadine (ALLEGRA) 180 MG tablet Take 180 mg by mouth daily as needed for allergies.   fluticasone (FLONASE) 50 MCG/ACT nasal spray Place 1 spray into both nostrils daily as needed for allergies.   gabapentin (NEURONTIN) 100 MG capsule Take 2 capsules (200 mg total) by mouth at bedtime.   ibuprofen (ADVIL) 600 MG tablet Take 1 tablet (600 mg total) by mouth every 6 (six) hours as needed.   mesalamine (LIALDA) 1.2 g EC tablet Take 1.2 g by mouth at bedtime.   montelukast (SINGULAIR) 10 MG tablet Take 1 tablet by mouth at bedtime.   Multiple Vitamins-Minerals (MULTIVITAMIN WITH MINERALS) tablet Take 1 tablet by mouth daily.   Omega-3 1000 MG CAPS Take 1,000 mg by mouth daily.   SUMAtriptan (IMITREX) 100 MG tablet Take 100 mg by mouth every 2 (two) hours as needed for migraine.   venlafaxine XR (EFFEXOR-XR) 75 MG 24 hr capsule Take 1 capsule (75  mg total) by mouth daily with breakfast.      Objective:   VITALS:   Vitals:   07/20/22 0920  BP: 129/81  Pulse: 88  SpO2: 98%  Weight: 139 lb 9.6 oz (63.3 kg)  Height: '5\' 2"'$  (1.575 m)   Gen:  Appears stated age and in NAD. HEENT:  Normocephalic, atraumatic. The mucous membranes are moist. The superficial temporal arteries are without ropiness or tenderness. Cardiovascular: Regular rate and rhythm. Lungs: Clear to auscultation bilaterally. Neck: There are no carotid bruits noted bilaterally.  NEUROLOGICAL:  Orientation:  The patient is alert and oriented x 3.   Cranial nerves: There is good facial symmetry without hypomimia. Extraocular muscles are intact and visual fields are full to confrontational testing. Speech is fluent and clear. Soft palate rises  symmetrically and there is no tongue deviation. Hearing is intact to conversational tone. Tone: there is mild to mod increased tone in the RUE/RLE.  There is nl tone on the L Sensation: Sensation is intact to light touch touch throughout (facial, trunk, extremities). Vibration is intact at the bilateral big toe. There is no extinction with double simultaneous stimulation. There is no sensory dermatomal level identified. Coordination:  The patient has no dysdiadichokinesia or dysmetria. Motor: Strength is 5/5 in the bilateral upper and lower extremities.  Shoulder shrug is equal bilaterally.  There is no pronator drift.  There are no fasciculations noted. DTR's: Deep tendon reflexes are 3/4 at the bilateral biceps, triceps, brachioradialis, 3+patella and 2+ at the bilateral achilles.  Plantar responses are downgoing bilaterally. Gait and Station: The patient is able to ambulate without difficulty. The patient is able to heel toe walk without any difficulty. The patient is able to ambulate in a tandem fashion. The patient is able to stand in the Romberg position.   MOVEMENT EXAM: Tremor:  There is RUE rest tremor that increases with  distraction.  There is very rare LUE/LLE rest tremor with distraction proc only.    I have reviewed and interpreted the following labs independently   Chemistry      Component Value Date/Time   NA 140 05/18/2022 0849   K 4.0 05/18/2022 0849   CL 103 05/18/2022 0849   CO2 30 05/18/2022 0849   BUN 19 05/18/2022 0849   CREATININE 0.76 05/18/2022 0849      Component Value Date/Time   CALCIUM 9.2 05/18/2022 0849   ALKPHOS 70 05/18/2022 0849   AST 20 05/18/2022 0849   ALT 24 05/18/2022 0849   BILITOT 0.4 05/18/2022 0849      Lab Results  Component Value Date   WBC 8.5 05/18/2022   HGB 13.4 05/18/2022   HCT 38.9 05/18/2022   MCV 89.2 05/18/2022   PLT 347 05/18/2022   Lab Results  Component Value Date   TSH 0.790 06/10/2020   TSH 0.73 06/30/22    Total time spent on today's visit was 65 minutes, including both face-to-face time and nonface-to-face time.  Time included that spent on review of records (prior notes available to me/labs/imaging if pertinent), discussing treatment and goals, answering patient's questions and coordinating care.  CC:  Wenda Low, MD

## 2022-07-20 ENCOUNTER — Ambulatory Visit (INDEPENDENT_AMBULATORY_CARE_PROVIDER_SITE_OTHER): Payer: BC Managed Care – PPO | Admitting: Neurology

## 2022-07-20 ENCOUNTER — Encounter: Payer: Self-pay | Admitting: Neurology

## 2022-07-20 VITALS — BP 129/81 | HR 88 | Ht 62.0 in | Wt 139.6 lb

## 2022-07-20 DIAGNOSIS — F411 Generalized anxiety disorder: Secondary | ICD-10-CM

## 2022-07-20 DIAGNOSIS — C50912 Malignant neoplasm of unspecified site of left female breast: Secondary | ICD-10-CM

## 2022-07-20 DIAGNOSIS — R292 Abnormal reflex: Secondary | ICD-10-CM

## 2022-07-20 DIAGNOSIS — C50911 Malignant neoplasm of unspecified site of right female breast: Secondary | ICD-10-CM

## 2022-07-20 DIAGNOSIS — G20A1 Parkinson's disease without dyskinesia, without mention of fluctuations: Secondary | ICD-10-CM

## 2022-07-20 MED ORDER — CARBIDOPA-LEVODOPA 25-100 MG PO TABS
1.0000 | ORAL_TABLET | Freq: Three times a day (TID) | ORAL | 1 refills | Status: DC
Start: 2022-07-20 — End: 2023-01-11

## 2022-07-20 NOTE — Patient Instructions (Addendum)
Goal on bike is 90 revolutions per min at 150 min per week Goal heart rate is 80%(220-age)  A referral to [facility] has been placed for your MRI someone will contact you directly to schedule your appt. They are located at [place]. Please contact them directly by calling [phone] with any questions regarding your referral.  Start Carbidopa Levodopa as follows: Take 1/2 tablet three times daily, at least 30 minutes before meals (approximately 7am/11am/4pm), for one week Then take 1/2 tablet in the morning, 1/2 tablet in the afternoon, 1 tablet in the evening, at least 30 minutes before meals, for one week Then take 1/2 tablet in the morning, 1 tablet in the afternoon, 1 tablet in the evening, at least 30 minutes before meals, for one week Then take 1 tablet three times daily at 7am/11am/4pm, at least 30 minutes before meals   As a reminder, carbidopa/levodopa can be taken at the same time as a carbohydrate, but we like to have you take your pill either 30 minutes before a protein source or 1 hour after as protein can interfere with carbidopa/levodopa absorption.  Local and Online Resources for Power over Parkinson's Group  December 2023    LOCAL [place] PARKINSON'S GROUPS   Power over Parkinson's Group:    Power Over Parkinson's Patient Education Group will be Wednesday, December 13th-*Hybrid meting*- in person at [facility] location and via [facility], 2:00-3:00 pm.   Starting in November, Power over [employer] and Care Partner Groups will meet together, with plans for separate break out session for caregivers (*this will be evolving over the next few months) Upcoming Power over Parkinson's Meetings/Care Partner Support:  2nd Wednesdays of the month at 2 pm:   December 13th, January 10th  [place] at [email or web address] if interested in participating in this group    [place] Party!  Wednesday,  December 6th, 4:00-5:00 pm.  [facility] and Fitness.  RSVP to Garnetta Buddy at [phone] or [email or web address] New PWR! Moves Dynegy Instructor-Led Classes offering at [employer]!  TUESDAYS and Wednesdays 1-2 pm.   Contact Vonna Kotyk at  [email or web address]  or [phone] (Tuesday classes are modified for chair and standing only) Dance for Parkinson 's classes will be on Tuesdays 9:30am-10:30am starting October 3-December 12 with a break the week of November 21st. Located in the [employer] , in the first floor of the [employer] ([place] To register:  [email or web address] or [phone]  Drumming for Parkinson's will be held on 2nd and 4th Mondays at 11:00 am.   Located at the [place] ([place]  [place] at [email or web address] or [phone]  Through support from the [place] for Parkinson's classes are free for both patients and caregivers.    [employer] Parkinson's Tai Chi Class, Mondays at 11 am.  Call [phone] for details   [place]:  [email or web address]  PD Health at Home continues:  Mindfulness Mondays, Wellness Wednesdays, Fitness Fridays   Upcoming Education:    Eating and Feeling Well through the [place]. Wednesday, Dec. 6th,  1-2 pm  Hospital Safety.  Wednesday, Dec. 13th, 1-2 pm Register for expert briefings (webinars) at [email or web address]  Please check out their website to sign up for emails and see their full online offerings      [place]:  [email or web address]  Third Thursday Webinars:  On the third Thursday of every month at 12 p.m. ET, join our free live webinars to learn about various aspects of living with Parkinson's disease and our work to speed  medical breakthroughs.  Upcoming Webinar:  Tools for Diagnosing and Visualizing Parkinson's Disease.  Thursday, December 21st at 12 noon. Check out additional information on their website to see their full online offerings    [facility]:  [email or web address]  Upcoming Webinar:   Stay tuned  Webinar Series:  Living with Parkinson's Meetup.   Third Thursdays each month, 3 pm  Care Partner Monthly Meetup.  With Robin Searing Phinney.  First Tuesday of each month, 2 pm  Check out additional information to Live Well Today on their website    Parkinson and Movement Disorders (PMD) Alliance:  www.[email or web address]  [employer] Online:  Online Education Events  Sign up for emails, which are sent weekly to give you updates on programming and online offerings    Parkinson's Association of the [place]:  www.[email or web address]  Information on online support groups, education events, and online exercises including Yoga, Parkinson's exercises and more-LOTS of information on links to PD resources and online events  Virtual Support Group through Parkinson's Association of the [place]; next one is scheduled for Wednesday, December 6th  at 2 pm.  (These are typically scheduled for the 1st Wednesday of the month at 2 pm).  Visit website for details.   MOVEMENT AND EXERCISE OPPORTUNITIES  PWR! Moves Classes at [place].  Wednesdays 10 and 11 am.   Contact Amy Marriott, PT [email or web address] if interested.  NEW PWR! Moves Class offerings at [employer].  *TUESDAYS* and Wednesdays 1-2 pm.    Contact Vonna Kotyk at  [email or web address]    Parkinson's Wellness Recovery (PWR! Moves)  www.[email or web address]  Info on the PWR! Virtual Experience:  You will have access to our expertise?through self-assessment, guided plans that start with the PD-specific fundamentals, educational content, tips, Q&A with an expert, and a growing Art therapist of PD-specific  pre-recorded and live exercise classes of varying types and intensity - both physical and cognitive! If that is not enough, we offer 1:1 wellness consultations (in-person or virtual) to personalize your PWR! Research scientist (medical).   [place] Fridays:   As part of the PD Health @ Home program, this free video series focuses each week on one aspect of fitness designed to support people living with Parkinson's.? These weekly videos highlight the [place] fitness guidelines for people with Parkinson's disease.  [email or web address]   Dance for PD website is offering free, live-stream classes throughout the week, as well as links to [employer] of classes:  [email or web address]  Virtual dance and Pilates for Parkinson's classes: Click on the Community Tab> Parkinson's Movement Initiative Tab.  To register for classes and for more information, visit [email or web address] and click the "community" tab.   [employer] Parkinson's Cycling Classes   [employer]:  Thursdays @ Noon-Live classes at [employer] ([employer] at [email or web address]?or [phone])  [facility] [employer]: Virtual Classes Mondays and Thursdays Jeanette Caprice classes Tuesday, Wednesday and Thursday (contact [place] at [email or web address] ?or [phone])  [place]  Varied levels of classes are offered Tuesdays and Thursdays at [employer].   Stretching with Verdis Frederickson weekly class is also offered for people with Parkinson's  To observe a class or for more information, call [phone] or email Hezzie Bump at [email or web address]   ADDITIONAL SUPPORT AND RESOURCES  [employer]:Online Caregiver Education Opportunities:  www.well-springsolutions.org/caregiver-education/caregiver-support-group.  You may also contact Vickki Muff at [email or web address] or [phone].      Well-Spring Navigator:  Just1Navigator program, a?free service to help individuals and families through the journey of determining care for older adults.  The "Navigator" is a [phone], Education officer, museum, who will speak with a prospective client and/or loved ones to provide an assessment of the situation and a set of recommendations for a personalized care plan -- all free of charge, and whether?Well[employer]Spring [employer] offers the needed service or not. If the need is not a service we provide, we are well-connected with reputable programs in town that we can refer you to.  www.well-springsolutions.org or to speak with the Navigator, call [phone].

## 2022-08-12 ENCOUNTER — Ambulatory Visit: Payer: BC Managed Care – PPO | Admitting: Neurology

## 2022-08-13 ENCOUNTER — Telehealth: Payer: Self-pay

## 2022-08-13 MED ORDER — PREDNISONE 50 MG PO TABS: ORAL_TABLET | ORAL | 0 refills | Status: DC

## 2022-08-13 NOTE — Telephone Encounter (Signed)
Phone call to patient to review instructions for 13 hr prep for MRI w/ contrast on 08/24/22 at 1720. Prescription called into [facility]. Pt aware and verbalized understanding of instructions . Prescription: Pt to take 50 mg of prednisone on 08/24/22 at 4:20 AM, 50 mg of prednisone on 08/24/22 at 10:20 AM, and 50 mg of prednisone on 08/24/22 at 4:20 PM. Pt is also to take 50 mg of benadryl on 08/24/22 at 4:20 PM. Please call [phone] with any questions.  Benadryl was not called in as a prescription. Pt reports she has benadryl at home and verbalized understanding of when to take the medication.

## 2022-08-24 ENCOUNTER — Ambulatory Visit
Admission: RE | Admit: 2022-08-24 | Discharge: 2022-08-24 | Disposition: A | Discharge status: Home / Self Care | Payer: BC Managed Care – PPO | Source: Ambulatory Visit | Attending: Neurology | Admitting: Neurology

## 2022-08-24 DIAGNOSIS — G20A1 Parkinson's disease without dyskinesia, without mention of fluctuations: Secondary | ICD-10-CM | POA: Diagnosis not present

## 2022-08-24 DIAGNOSIS — R292 Abnormal reflex: Secondary | ICD-10-CM

## 2022-08-24 DIAGNOSIS — C50911 Malignant neoplasm of unspecified site of right female breast: Secondary | ICD-10-CM

## 2022-08-24 MED ORDER — GADOPICLENOL 0.5 MMOL/ML IV SOLN
7.0000 mL | Freq: Once | INTRAVENOUS | Status: AC | PRN
  Administered 2022-08-24: 7 mL via INTRAVENOUS

## 2022-08-28 DIAGNOSIS — Z01419 Encounter for gynecological examination (general) (routine) without abnormal findings: Secondary | ICD-10-CM | POA: Diagnosis not present

## 2022-08-28 DIAGNOSIS — Z6825 Body mass index (BMI) 25.0-25.9, adult: Secondary | ICD-10-CM | POA: Diagnosis not present

## 2022-08-28 DIAGNOSIS — Z1151 Encounter for screening for human papillomavirus (HPV): Secondary | ICD-10-CM | POA: Diagnosis not present

## 2022-08-28 DIAGNOSIS — Z124 Encounter for screening for malignant neoplasm of cervix: Secondary | ICD-10-CM | POA: Diagnosis not present

## 2022-09-11 DIAGNOSIS — L820 Inflamed seborrheic keratosis: Secondary | ICD-10-CM | POA: Diagnosis not present

## 2022-09-11 DIAGNOSIS — L578 Other skin changes due to chronic exposure to nonionizing radiation: Secondary | ICD-10-CM | POA: Diagnosis not present

## 2022-09-11 DIAGNOSIS — L814 Other melanin hyperpigmentation: Secondary | ICD-10-CM | POA: Diagnosis not present

## 2022-09-11 DIAGNOSIS — D225 Melanocytic nevi of trunk: Secondary | ICD-10-CM | POA: Diagnosis not present

## 2022-09-11 DIAGNOSIS — L570 Actinic keratosis: Secondary | ICD-10-CM | POA: Diagnosis not present

## 2022-09-14 DIAGNOSIS — D120 Benign neoplasm of cecum: Secondary | ICD-10-CM | POA: Diagnosis not present

## 2022-09-14 DIAGNOSIS — D123 Benign neoplasm of transverse colon: Secondary | ICD-10-CM | POA: Diagnosis not present

## 2022-09-14 DIAGNOSIS — Z86010 Personal history of colonic polyps: Secondary | ICD-10-CM | POA: Diagnosis not present

## 2022-09-14 DIAGNOSIS — K523 Indeterminate colitis: Secondary | ICD-10-CM | POA: Diagnosis not present

## 2022-09-14 DIAGNOSIS — K648 Other hemorrhoids: Secondary | ICD-10-CM | POA: Diagnosis not present

## 2022-09-14 DIAGNOSIS — D128 Benign neoplasm of rectum: Secondary | ICD-10-CM | POA: Diagnosis not present

## 2022-09-14 DIAGNOSIS — K5730 Diverticulosis of large intestine without perforation or abscess without bleeding: Secondary | ICD-10-CM | POA: Diagnosis not present

## 2022-10-11 ENCOUNTER — Other Ambulatory Visit: Payer: Self-pay | Admitting: Hematology

## 2022-10-12 ENCOUNTER — Other Ambulatory Visit: Payer: Self-pay

## 2022-10-15 ENCOUNTER — Other Ambulatory Visit: Payer: Self-pay | Admitting: Hematology

## 2022-10-22 NOTE — Progress Notes (Signed)
Assessment/Plan:   1.  Parkinsons Disease, diagnosed January 2024  -She will continue carbidopa/levodopa 25/100, 1 tablet 3 times per day.  -she is doing burn boot camp 3 days per week  -We discussed that it used to be thought that levodopa would increase risk of melanoma but now it is believed that Parkinsons itself likely increases risk of melanoma. she is to get regular skin checks.  -will get her connected with LCSW for THRIVE and community education programs  2.  GAD  -On Effexor  Subjective:   Chelsea Hobbs was seen today in follow up for Parkinsons disease.  My previous records were reviewed prior to todays visit as well as outside records available to me.  The patient was diagnosed last visit.  We started her on levodopa.  She is tolerating the medication well, without side effects.  She reports that she is doing much better with the medication at work with fine motor coordination.   She feels like the tremor is much better.  Likewise, its easier to cook/cut vegetables.  Pt denies falls.  Pt denies lightheadedness, near syncope.  No hallucinations.  We gave her information to the THRIVE group last visit but hasn't been able to access that resource yet.  She is doing [employer]Burn Boot camp 3 days per week.  She had an MRI of the brain August 24, 2022.  This was unremarkable.  Current prescribed movement disorder medications: Carbidopa/levodopa 25/1 tablet 3 times per day   PREVIOUS MEDICATIONS: Sinemet  ALLERGIES:   Allergies  Allergen Reactions   Gadolinium Derivatives Hives and Other (See Comments)    Pt started wheezing after contrast/ runny nose/ 1 hive on arm/ Dr Molli PoseyMansell checked pt condition/ pt has an inhaler so used her inhaler and felt better/ no medication given / pt was instructed to [facility] for 30 min to be reassessed by Dr.//jv    Latex Dermatitis   Tetracycline Hcl Diarrhea   Wound Dressing Adhesive Rash    Dermabond    CURRENT MEDICATIONS:   No outpatient medications have been marked as taking for the 10/26/22 encounter (Appointment) with Daeshawn Redmann, Octaviano Battyebecca S, DO.     Objective:   PHYSICAL EXAMINATION:    VITALS:  There were no vitals filed for this visit.  GEN:  The patient appears stated age and is in NAD. HEENT:  Normocephalic, atraumatic.  The mucous membranes are moist. The superficial temporal arteries are without ropiness or tenderness. CV:  RRR Lungs:  CTAB Neck/HEME:  There are no carotid bruits bilaterally.  Neurological examination:  Orientation: The patient is alert and oriented x3. Cranial nerves: There is good facial symmetry without facial hypomimia. The speech is fluent and clear. Soft palate rises symmetrically and there is no tongue deviation. Hearing is intact to conversational tone. Sensation: Sensation is intact to light touch throughout Motor: Strength is at least antigravity x4.  Movement examination: Tone: There is normal tone in the UE/LE (marked improvement) Abnormal movements: normal Coordination:  There is no decremation with RAM's, with any form of RAMS, including alternating supination and pronation of the forearm, hand opening and closing, finger taps, heel taps and toe taps.  Gait and Station: The patient has no difficulty arising out of a deep-seated chair without the use of the hands. The patient's stride length is normal.    I have reviewed and interpreted the following labs independently    Chemistry      Component Value Date/Time   NA  140 05/18/2022 0849   K 4.0 05/18/2022 0849   CL 103 05/18/2022 0849   CO2 30 05/18/2022 0849   BUN 19 05/18/2022 0849   CREATININE 0.76 05/18/2022 0849      Component Value Date/Time   CALCIUM 9.2 05/18/2022 0849   ALKPHOS 70 05/18/2022 0849   AST 20 05/18/2022 0849   ALT 24 05/18/2022 0849   BILITOT 0.4 05/18/2022 0849       Lab Results  Component Value Date   WBC 8.5 05/18/2022   HGB 13.4 05/18/2022   HCT 38.9 05/18/2022   MCV 89.2  05/18/2022   PLT 347 05/18/2022    Lab Results  Component Value Date   TSH 0.790 06/10/2020      Cc:  Georgann Housekeeper, MD

## 2022-10-26 ENCOUNTER — Encounter: Payer: Self-pay | Admitting: Neurology

## 2022-10-26 ENCOUNTER — Ambulatory Visit (INDEPENDENT_AMBULATORY_CARE_PROVIDER_SITE_OTHER): Payer: BC Managed Care – PPO | Admitting: Neurology

## 2022-10-26 VITALS — BP 118/78 | HR 73 | Ht 62.0 in | Wt 143.8 lb

## 2022-10-26 DIAGNOSIS — G20A1 Parkinson's disease without dyskinesia, without mention of fluctuations: Secondary | ICD-10-CM

## 2022-11-13 ENCOUNTER — Other Ambulatory Visit: Payer: Self-pay

## 2022-11-13 DIAGNOSIS — Z170 Estrogen receptor positive status [ER+]: Secondary | ICD-10-CM

## 2022-11-15 NOTE — Progress Notes (Unsigned)
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   Oncology History Overview Note  Cancer Staging Malignant neoplasm of upper-inner quadrant of left breast in female, estrogen receptor positive (HCC) Staging form: Breast, AJCC 8th Edition - Clinical stage from 03/22/2019: Stage IA (cT1c, cN0, cM0, G3, ER+, PR+, HER2+) - Signed by Malachy Mood, MD on 03/22/2019 Stage prefix: Initial diagnosis Histologic grading system: 3 grade system - Pathologic stage from 04/04/2019: Stage IA (pT1c, pN0, cM0, G3, ER+, PR+, HER2+) - Signed by Malachy Mood, MD on 04/21/2019 Stage prefix: Initial diagnosis Multigene prognostic tests performed: None Histologic grading system: 3 grade system Residual tumor (R): R0 - None - Pathologic: No stage assigned - Unsigned    Malignant neoplasm of upper-inner quadrant of left breast in female, estrogen receptor positive (HCC)  03/10/2019 Mammogram   Diagnostic Mammogram 03/10/19  IMPRESSION: 1. Suspicious mass in the 10 o'clock position of the left breast, 3cm from the nipple measuring 12x7x9mm. Biopsy is indicated. 2. Indeterminate mass in the 5:30 o'clock position of the left breast, 5cm from the nipple, measuring 9x6x23mm. Although this may reflect a fibroadenoma, tissue sampling is recommended.   03/16/2019 Initial Biopsy   Diagnosis 03/16/19  1. 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 SEE NOTE 2. Breast, left, needle core biopsy, 5:30 o'clock - FIBROADENOMA   03/16/2019 Receptors her2    Results: IMMUNOHISTOCHEMICAL AND MORPHOMETRIC ANALYSIS PERFORMED MANUALLY The tumor cells are POSITIVE for Her2 (3+). Estrogen Receptor: 100%, POSITIVE, STRONG STAINING INTENSITY Progesterone Receptor: 100%, POSITIVE, STRONG STAINING INTENSITY Proliferation Marker Ki67: 60%   03/21/2019 Initial Diagnosis   Malignant neoplasm of upper-inner quadrant of left breast in female, estrogen receptor positive (HCC)   03/22/2019 Cancer Staging   Staging form: Breast, AJCC 8th Edition - Clinical stage from 03/22/2019: Stage IA (cT1c, cN0, cM0, G3, ER+, PR+, HER2+) - Signed by Malachy Mood, MD on 03/22/2019   03/31/2019 Genetic Testing   VUS in CTNNA1 called [id number] (Intronic) was identified on the [facility] Common Hereditary Cancers Panel. The Common Hereditary Cancers Panel offered by [facility] includes sequencing and/or deletion duplication testing of the following 47 genes: APC, ATM, AXIN2, BARD1, BMPR1A, BRCA1, BRCA2, BRIP1, CDH1, CDKN2A (p14ARF), CDKN2A (p16INK4a), CKD4, CHEK2, CTNNA1, DICER1, EPCAM (Deletion/duplication testing only), GREM1 (promoter region deletion/duplication testing only), KIT, MEN1, MLH1, MSH2, MSH3, MSH6, MUTYH, NBN, NF1, NHTL1, PALB2, PDGFRA, PMS2, POLD1, POLE, PTEN, RAD50, RAD51C, RAD51D, SDHB, SDHC, SDHD, SMAD4, SMARCA4. STK11, TP53, TSC1, TSC2, and VHL.  The following genes were evaluated for sequence changes only: SDHA and HOXB13 c.251G>A variant only. The report date is 03/30/2019.    03/31/2019 Breast MRI   MRI breast 03/31/19  IMPRESSION: 1. 5 millimeter enhancing oval mass in the anteromedial aspect of the RIGHT breast warranting further evaluation. Given its anterior depth and visibility on noncontrast images, ultrasound is recommended to determine if there is a sonographic correlate. If no sonographic correlate is identified to guide biopsy, MRI biopsy of the RIGHT breast is recommended. 2. Known malignancy in the UPPER INNER QUADRANT of the LEFT breast measuring 1.0  centimeters. 3. Biopsy  proven fibroadenoma in the LOWER OUTER QUADRANT of the LEFT breast.   04/04/2019 Surgery   RADIOCATIVE SEED GUIDED LEFT BREAST LUMPECTOMY, LEFT AXILLARY SENTINEL LYMPH NODE BIOPSY and PAC placement  By Dr. Luisa Hart  04/04/19    04/04/2019 Pathology Results   DIAGNOSIS: 04/04/19  A. BREAST, LEFT, LUMPECTOMY:  -  Invasive ductal carcinoma, Nottingham grade 3 of 3, 1.3 cm  -  Ductal carcinoma in-situ, intermediate grade  -  Margins uninvolved by carcinoma (0.1 cm; posterior margin; see  comment)  -  Previous biopsy site changes present  -  See oncology table and comment below   B. BREAST, LEFT, ADDITIONAL MEDIAL MARGIN, EXCISION:  -  Fibrocystic changes  -  No residual carcinoma identified   C. LYMPH NODE, LEFT AXILLARY, SENTINEL, BIOPSY:  -   No carcinoma identified in one lymph node (0/1)    04/04/2019 Cancer Staging   Staging form: Breast, AJCC 8th Edition - Pathologic stage from 04/04/2019: Stage IA (pT1c, pN0, cM0, G3, ER+, PR+, HER2+) - Signed by Malachy Mood, MD on 04/21/2019   04/11/2019 Pathology Results   Diagnosis Breast, right, needle core biopsy, 2 o'clock - SMALL INTRADUCTAL PAPILLOMA. SEE NOTE - SMALL FIBROADENOMA - NEGATIVE FOR CARCINOMA   05/12/2019 - 06/10/2020 Chemotherapy   Adjuvant Weekly Taxol and Herceptin for 12 weeks starting 05/12/19, switched to Abraxane on 08/04/19 due to skin rash. Completed on 08/11/19.  Followed by maintenance Herceptin q3weeks to complete 1 year treatment (from 05/12/19) starting 09/01/19-06/10/20   08/29/2019 - 09/26/2019 Radiation Therapy   Adjuvant radiation per Dr. Roselind Messier    10/12/2019 -  Anti-estrogen oral therapy   Tamoxifen 20mg  daily starting 10/12/19   11/27/2019 Survivorship   SCP delivered by Santiago Glad, NP    03/15/2020 Mammogram   IMPRESSION: 1. Expected surgical changes at the lumpectomy site in the central posterior left breast.   2. No suspicious changes at the site of the biopsied papilloma  in the lower inner retroareolar right breast (ribbon clip). Surgical excision is planned for this lesion.   3.  No evidence of malignancy in the bilateral breasts.   04/17/2020 Surgery   RIGHT BREAST LUMPECTOMY WITH RADIOACTIVE SEED LOCALIZATION and PAC removal by Dr Luisa Hart   FINAL MICROSCOPIC DIAGNOSIS:   A. BREAST, RIGHT, LUMPECTOMY:  - Biopsy clip.  Fibroadenoma.  Fibroadenomatoid change.  Fibrocystic  change.        CURRENT THERAPY:  -Zometa every 6 months starting 11/27/19 -Tamoxifen 20mg  daily starting 10/12/19. Switched to Anastrozole in 09/2020. Switched to Exemestane 05/2022 due to SE's from anastrozole (joint pain, decreased grip strength)  INTERVAL HISTORY Chelsea Hobbs returns for follow up as scheduled. Last seen by me 05/18/22 and switched to exemestane due to SEs from anastrozole.   ROS   Past Medical History:  Diagnosis Date   Chemotherapy-induced neuropathy (HCC)    per pt effects hands, legs, and feet   Family history of breast cancer    Family history of lung cancer    History of 2019 novel coronavirus disease (COVID-19) 07/17/2019   positive result in care everywhere, mild symptoms that resolved   History of colitis    followed by dr Minda Ditto (GI)--- per pt dx age 67s , residual scarring, take lialda daily   History of right breast cancer 10/2020   followed by oncology---  04-17-2020 s/p right breast lumpectomy, fibroadema;  dx 04/ 2022 w/ DCIS right breast  s/p right mastectomy with node bx 12-26-2020 dx DCIS stage 0 (no chemo/ radiation)  Malignant neoplasm of upper-inner quadrant of left breast in female, estrogen receptor positive (HCC) 03/2019   oncologist--- dr y. Mosetta Putt;  dx left breast IDC w/ DCIS, Stage IA,  04-04-2019 s/p left breast lumpectomy with node bx;  completed chemo 06-10-2020 and radiation 09-26-2019;  ;  12-26-2020 s/p bilateral  mastectomy with reconstruction with right node bx;  right breast DCIS dx 04/ 2022   Mild asthma    followed by pcp    Personal history of chemotherapy    05-12-2019  to 06-10-2020, left breast cancer   Personal history of radiation therapy    08-29-2019  to  09-26-2019,  left breast cancer     Past Surgical History:  Procedure Laterality Date   BREAST LUMPECTOMY Left 04/04/2019   BREAST LUMPECTOMY WITH RADIOACTIVE SEED AND SENTINEL LYMPH NODE BIOPSY Left 04/04/2019   Procedure: RADIOCATIVE SEED GUIDED LEFT BREAST LUMPECTOMY, LEFT AXILLARY SENTINEL LYMPH NODE BIOPSY;  Surgeon: Harriette Bouillon, MD;  Location: [facility];  Service: General;  Laterality: Left;   BREAST LUMPECTOMY WITH RADIOACTIVE SEED LOCALIZATION Right 04/17/2020   Procedure: RIGHT BREAST LUMPECTOMY WITH RADIOACTIVE SEED LOCALIZATION;  Surgeon: Harriette Bouillon, MD;  Location: [facility];  Service: General;  Laterality: Right;   BREAST RECONSTRUCTION WITH PLACEMENT OF TISSUE EXPANDER AND ALLODERM Right 12/26/2020   Procedure: RIGHT BREAST RECONSTRUCTION WITH PLACEMENT OF TISSUE EXPANDER AND ALLODERM;  Surgeon: Glenna Fellows, MD;  Location: [facility];  Service: Plastics;  Laterality: Right;   COLONOSCOPY  2021   LAPAROSCOPIC SALPINGO OOPHERECTOMY Bilateral 02/11/2021   Procedure: LAPAROSCOPIC BILATERAL SALPINGO OOPHORECTOMY;  Surgeon: Ranae Pila, MD;  Location: [facility];  Service: Gynecology;  Laterality: Bilateral;   LATISSIMUS FLAP TO BREAST Left 12/26/2020   Procedure: LEFT BREAST RECONSTRUCTION WITH LATISSIMUS FLAP AND PLACEMENT OF TISSUE EXPANDER;  Surgeon: Glenna Fellows, MD;  Location: [facility];  Service: Plastics;  Laterality: Left;   LIPOSUCTION WITH LIPOFILLING Bilateral 04/01/2021   Procedure: LIPOFILLING BILATERAL CHEST;  Surgeon: Glenna Fellows, MD;  Location: [facility];  Service: Plastics;  Laterality: Bilateral;   NASAL SINUS SURGERY Bilateral 2009   NIPPLE SPARING MASTECTOMY Bilateral 12/26/2020   Procedure: BILATERAL SKIN SPARING MASTECTOMIES;  Surgeon:  Harriette Bouillon, MD;  Location: [facility];  Service: General;  Laterality: Bilateral;   PORT-A-CATH REMOVAL Right 04/17/2020   Procedure: REMOVAL PORT-A-CATH;  Surgeon: Harriette Bouillon, MD;  Location: [facility];  Service: General;  Laterality: Right;   PORTACATH PLACEMENT N/A 04/04/2019   Procedure: INSERTION PORT-A-CATH WITH ULTRASOUND;  Surgeon: Harriette Bouillon, MD;  Location: [facility];  Service: General;  Laterality: N/A;   REMOVAL OF BILATERAL TISSUE EXPANDERS WITH PLACEMENT OF BILATERAL BREAST IMPLANTS Bilateral 04/01/2021   Procedure: REMOVAL OF BILATERAL TISSUE EXPANDERS WITH PLACEMENT OF BILATERAL BREAST SILICONE IMPLANTS;  Surgeon: Glenna Fellows, MD;  Location: [facility];  Service: Plastics;  Laterality: Bilateral;   SENTINEL NODE BIOPSY Right 12/26/2020   Procedure: RIGHT AXILLARY SENTINEL LYMPH NODE BIOPSY;  Surgeon: Harriette Bouillon, MD;  Location: [facility];  Service: General;  Laterality: Right;   WISDOM TOOTH EXTRACTION Bilateral      Outpatient Encounter Medications as of 11/16/2022  Medication Sig Note   albuterol (PROVENTIL HFA;VENTOLIN HFA) 108 (90 BASE) MCG/ACT inhaler Inhale 2 puffs into the lungs every 6 (six) hours as needed for shortness of breath.    ALPRAZolam (XANAX) 0.5 MG tablet Take 0.25 mg by mouth 2 (two) times daily as needed for anxiety.  Ascorbic Acid (VITAMIN C) 1000 MG tablet Take 2,000 mg by mouth daily. Lysine    Calcium-Magnesium-Vitamin D (CALCIUM MAGNESIUM PO) Take 1,200 mg by mouth daily.    carbidopa-levodopa (SINEMET IR) 25-100 MG tablet Take 1 tablet by mouth 3 (three) times daily. 7am/11am/4pm    Cholecalciferol (VITAMIN D) 125 MCG (5000 UT) CAPS Take 5,000 Units by mouth daily.    docusate sodium (COLACE) 100 MG capsule Take 1 capsule (100 mg total) by mouth 2 (two) times daily.    exemestane (AROMASIN) 25 MG tablet TAKE 1 TABLET (25 MG TOTAL) BY MOUTH DAILY AFTER BREAKFAST.    fexofenadine (ALLEGRA) 180  MG tablet Take 180 mg by mouth daily as needed for allergies.    fluticasone (FLONASE) 50 MCG/ACT nasal spray Place 1 spray into both nostrils daily as needed for allergies.    gabapentin (NEURONTIN) 100 MG capsule Take 2 capsules (200 mg total) by mouth at bedtime.    ibuprofen (ADVIL) 600 MG tablet Take 1 tablet (600 mg total) by mouth every 6 (six) hours as needed.    montelukast (SINGULAIR) 10 MG tablet Take 1 tablet by mouth at bedtime. 02/07/2021: .   Multiple Vitamins-Minerals (MULTIVITAMIN WITH MINERALS) tablet Take 1 tablet by mouth daily.    Omega-3 1000 MG CAPS Take 1,000 mg by mouth daily.    SUMAtriptan (IMITREX) 100 MG tablet Take 100 mg by mouth every 2 (two) hours as needed for migraine.    venlafaxine XR (EFFEXOR-XR) 37.5 MG 24 hr capsule TAKE 1 CAPSULE BY MOUTH DAILY WITH BREAKFAST.    venlafaxine XR (EFFEXOR-XR) 75 MG 24 hr capsule Take 1 capsule (75 mg total) by mouth daily with breakfast.    No facility-administered encounter medications on file as of 11/16/2022.     There were no vitals filed for this visit. There is no height or weight on file to calculate BMI.   PHYSICAL EXAM GENERAL:alert, no distress and comfortable SKIN: no rash  EYES: sclera clear NECK: without mass LYMPH:  no palpable cervical or supraclavicular lymphadenopathy  LUNGS: clear with normal breathing effort HEART: regular rate & rhythm, no lower extremity edema ABDOMEN: abdomen soft, non-tender and normal bowel sounds NEURO: alert & oriented x 3 with fluent speech, no focal motor/sensory deficits Breast exam:  PAC without erythema    CBC    Component Value Date/Time   WBC 8.5 05/18/2022 0849   WBC 7.0 02/07/2021 1028   RBC 4.36 05/18/2022 0849   HGB 13.4 05/18/2022 0849   HCT 38.9 05/18/2022 0849   PLT 347 05/18/2022 0849   MCV 89.2 05/18/2022 0849   MCH 30.7 05/18/2022 0849   MCHC 34.4 05/18/2022 0849   RDW 12.3 05/18/2022 0849   LYMPHSABS 2.3 05/18/2022 0849   MONOABS 0.5 05/18/2022  0849   EOSABS 0.5 05/18/2022 0849   BASOSABS 0.1 05/18/2022 0849     CMP     Component Value Date/Time   NA 140 05/18/2022 0849   K 4.0 05/18/2022 0849   CL 103 05/18/2022 0849   CO2 30 05/18/2022 0849   GLUCOSE 93 05/18/2022 0849   BUN 19 05/18/2022 0849   CREATININE 0.76 05/18/2022 0849   CALCIUM 9.2 05/18/2022 0849   PROT 7.4 05/18/2022 0849   ALBUMIN 4.5 05/18/2022 0849   AST 20 05/18/2022 0849   ALT 24 05/18/2022 0849   ALKPHOS 70 05/18/2022 0849   BILITOT 0.4 05/18/2022 0849   GFRNONAA >60 05/18/2022 0849   GFRAA >60 03/19/2020 1438  ASSESSMENT & PLAN:Chelsea Hobbs is a 52 y.o. female with    Right breast DCIS, ER/PR positive -Found on screening MRI 10/18/2020, s/p bilateral mastectomies by Dr. Luisa Hart 12/26/2020  -Path showed focal DCIS, low to intermediate grade with calcifications, and lobular neoplasia.  ER/PR both strongly positive.  Right axillary and left breast lymph nodes were negative -S/p BSO 02/11/2021 -S/P reconstruction by Dr. Leta Baptist 04/01/2021 -She has been on anastrozole since 09/2020, attempted to switch to exemestane in 04/2021 due to joint pain but only tried for several days and she switched back.   -due to worsening joint pain and decreased grip strength, she tried exemestane again in 05/2022     Malignant neoplasm of upper-inner quadrant of left breast, Stage IA, p(T1cN0M0), ER+/PR+, HER2+, Grade III -Diagnosed in 03/2019. S/p left breast lumpectomy and SLNB on 04/04/19. 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 Roselind Messier -She began tamoxifen in 10/2019 and changed to anastrozole in 09/2020. Due to worsening joint pain and decreased grip strength, she changed to exemestane in 05/2022    4. Anxiety, insomnia  -Pre-existing history of anxiety, managed by PCP on Effexor and Xanax  -Insomnia worsened during chemo   -She has met with our social work department in the past  -She has more anxiety at last visit, likely multifactorial including AI and situational from changes at home.  -I increased Effexor to 75 mg p.o. once daily, and Xanax as needed for acute/severe anxiety   5. Bone Health  -S/p Zometa starting 11/27/2019 every 6 months x4 doses -Per patient on last DEXA osteoporosis resolved -Continue calcium, vitamin D, and weightbearing exercise   6. Genetic Testing was negative for pathogenetic nutations   7 health maintenance  -She continues exercise and healthy active lifestyle -She anticipates scheduling a screening colonoscopy in the next few months      PLAN:  No orders of the defined types were placed in this encounter.     All questions were answered. The patient knows to call the clinic with any problems, questions or concerns. No barriers to learning were detected. I spent *** counseling the patient face to face. The total time spent in the appointment was *** and more than 50% was on counseling, review of test results, and coordination of care.   Santiago Glad, NP-C @DATE @

## 2022-11-16 ENCOUNTER — Encounter: Payer: Self-pay | Admitting: Nurse Practitioner

## 2022-11-16 ENCOUNTER — Inpatient Hospital Stay: Payer: BC Managed Care – PPO | Attending: Nurse Practitioner

## 2022-11-16 ENCOUNTER — Other Ambulatory Visit: Payer: Self-pay

## 2022-11-16 ENCOUNTER — Inpatient Hospital Stay (HOSPITAL_BASED_OUTPATIENT_CLINIC_OR_DEPARTMENT_OTHER): Payer: BC Managed Care – PPO | Admitting: Nurse Practitioner

## 2022-11-16 VITALS — BP 117/72 | HR 88 | Temp 98.9°F | Resp 17 | Wt 142.9 lb

## 2022-11-16 DIAGNOSIS — G4700 Insomnia, unspecified: Secondary | ICD-10-CM | POA: Diagnosis not present

## 2022-11-16 DIAGNOSIS — Z9013 Acquired absence of bilateral breasts and nipples: Secondary | ICD-10-CM | POA: Diagnosis not present

## 2022-11-16 DIAGNOSIS — Z79811 Long term (current) use of aromatase inhibitors: Secondary | ICD-10-CM | POA: Diagnosis not present

## 2022-11-16 DIAGNOSIS — G20A1 Parkinson's disease without dyskinesia, without mention of fluctuations: Secondary | ICD-10-CM | POA: Insufficient documentation

## 2022-11-16 DIAGNOSIS — F419 Anxiety disorder, unspecified: Secondary | ICD-10-CM | POA: Insufficient documentation

## 2022-11-16 DIAGNOSIS — Z923 Personal history of irradiation: Secondary | ICD-10-CM | POA: Insufficient documentation

## 2022-11-16 DIAGNOSIS — Z170 Estrogen receptor positive status [ER+]: Secondary | ICD-10-CM

## 2022-11-16 DIAGNOSIS — C50212 Malignant neoplasm of upper-inner quadrant of left female breast: Secondary | ICD-10-CM

## 2022-11-16 DIAGNOSIS — Z79899 Other long term (current) drug therapy: Secondary | ICD-10-CM | POA: Diagnosis not present

## 2022-11-16 DIAGNOSIS — D0511 Intraductal carcinoma in situ of right breast: Secondary | ICD-10-CM | POA: Insufficient documentation

## 2022-11-16 LAB — CBC WITH DIFFERENTIAL (CANCER CENTER ONLY)
Abs Immature Granulocytes: 0.02 10*3/uL (ref 0.00–0.07)
Basophils Absolute: 0.1 10*3/uL (ref 0.0–0.1)
Basophils Relative: 1 %
Eosinophils Absolute: 0.6 10*3/uL — ABNORMAL HIGH (ref 0.0–0.5)
Eosinophils Relative: 8 %
HCT: 40.6 % (ref 36.0–46.0)
Hemoglobin: 13.8 g/dL (ref 12.0–15.0)
Immature Granulocytes: 0 %
Lymphocytes Relative: 34 %
Lymphs Abs: 2.4 10*3/uL (ref 0.7–4.0)
MCH: 30.7 pg (ref 26.0–34.0)
MCHC: 34 g/dL (ref 30.0–36.0)
MCV: 90.4 fL (ref 80.0–100.0)
Monocytes Absolute: 0.4 10*3/uL (ref 0.1–1.0)
Monocytes Relative: 5 %
Neutro Abs: 3.8 10*3/uL (ref 1.7–7.7)
Neutrophils Relative %: 52 %
Platelet Count: 387 10*3/uL (ref 150–400)
RBC: 4.49 MIL/uL (ref 3.87–5.11)
RDW: 12.4 % (ref 11.5–15.5)
WBC Count: 7.2 10*3/uL (ref 4.0–10.5)
nRBC: 0 % (ref 0.0–0.2)

## 2022-11-16 LAB — CMP (CANCER CENTER ONLY)
ALT: 8 U/L (ref 0–44)
AST: 29 U/L (ref 15–41)
Albumin: 4.6 g/dL (ref 3.5–5.0)
Alkaline Phosphatase: 78 U/L (ref 38–126)
Anion gap: 6 (ref 5–15)
BUN: 16 mg/dL (ref 6–20)
CO2: 32 mmol/L (ref 22–32)
Calcium: 9.4 mg/dL (ref 8.9–10.3)
Chloride: 103 mmol/L (ref 98–111)
Creatinine: 0.66 mg/dL (ref 0.44–1.00)
GFR, Estimated: >60 mL/min (ref >60)
Glucose, Bld: 113 mg/dL — ABNORMAL HIGH (ref 70–99)
Potassium: 4.2 mmol/L (ref 3.5–5.1)
Sodium: 141 mmol/L (ref 135–145)
Total Bilirubin: 0.3 mg/dL (ref 0.3–1.2)
Total Protein: 7.5 g/dL (ref 6.5–8.1)

## 2022-11-16 MED ORDER — VENLAFAXINE HCL ER 37.5 MG PO CP24: 37.5000 mg | ORAL_CAPSULE | Freq: Every day | ORAL | 0 refills | Status: DC

## 2022-12-28 DIAGNOSIS — Z923 Personal history of irradiation: Secondary | ICD-10-CM | POA: Diagnosis not present

## 2022-12-28 DIAGNOSIS — Z853 Personal history of malignant neoplasm of breast: Secondary | ICD-10-CM | POA: Diagnosis not present

## 2022-12-28 DIAGNOSIS — Z9013 Acquired absence of bilateral breasts and nipples: Secondary | ICD-10-CM | POA: Diagnosis not present

## 2023-01-09 ENCOUNTER — Other Ambulatory Visit: Payer: Self-pay | Admitting: Neurology

## 2023-02-12 ENCOUNTER — Other Ambulatory Visit: Payer: Self-pay | Admitting: Hematology

## 2023-04-06 DIAGNOSIS — R070 Pain in throat: Secondary | ICD-10-CM | POA: Diagnosis not present

## 2023-04-06 DIAGNOSIS — R519 Headache, unspecified: Secondary | ICD-10-CM | POA: Diagnosis not present

## 2023-04-06 DIAGNOSIS — R0981 Nasal congestion: Secondary | ICD-10-CM | POA: Diagnosis not present

## 2023-04-09 ENCOUNTER — Other Ambulatory Visit: Payer: Self-pay | Admitting: Neurology

## 2023-04-12 DIAGNOSIS — L814 Other melanin hyperpigmentation: Secondary | ICD-10-CM | POA: Diagnosis not present

## 2023-04-12 DIAGNOSIS — L570 Actinic keratosis: Secondary | ICD-10-CM | POA: Diagnosis not present

## 2023-04-12 DIAGNOSIS — L578 Other skin changes due to chronic exposure to nonionizing radiation: Secondary | ICD-10-CM | POA: Diagnosis not present

## 2023-04-12 DIAGNOSIS — L820 Inflamed seborrheic keratosis: Secondary | ICD-10-CM | POA: Diagnosis not present

## 2023-04-27 ENCOUNTER — Encounter: Payer: Self-pay | Admitting: Neurology

## 2023-04-30 ENCOUNTER — Encounter: Payer: Self-pay | Admitting: Neurology

## 2023-05-10 ENCOUNTER — Ambulatory Visit: Payer: BC Managed Care – PPO | Admitting: Neurology

## 2023-05-12 ENCOUNTER — Other Ambulatory Visit: Payer: Self-pay | Admitting: Nurse Practitioner

## 2023-05-14 ENCOUNTER — Ambulatory Visit: Payer: BC Managed Care – PPO | Admitting: Neurology

## 2023-05-23 NOTE — Assessment & Plan Note (Addendum)
Stage IA, p(T1cN0M0), ER+/PR+, HER2+, Grade III -Diagnosed in 03/2019. S/p left breast lumpectomy and SLNB on 04/04/19. 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 Roselind Messier -She began tamoxifen in 10/2019 and changed to anastrozole in 09/2020. Due to worsening joint pain and decreased grip strength. Eventually, she changed to exemestane in 05/2022 which she is tolerating well with only mild joint pain, mostly in the mornings.  -2022 she did undergo double mastectomy and reconstructive surgery.  -2022 - she underwent bilateral oophorectomy.

## 2023-05-23 NOTE — Progress Notes (Unsigned)
Patient Care Team: Georgann Housekeeper, MD as PCP - General (Internal Medicine) Donnelly Angelica, RN as Oncology Nurse Navigator Pershing Proud, RN as Oncology Nurse Navigator Harriette Bouillon, MD as Consulting Physician (General Surgery) Malachy Mood, MD as Consulting Physician (Hematology) Antony Blackbird, MD as Consulting Physician (Radiation Oncology) Pollyann Samples, NP as Nurse Practitioner (Nurse Practitioner)  Clinic Day:  05/24/2023  Referring physician: Georgann Housekeeper, MD  ASSESSMENT & PLAN:   Assessment & Plan: Malignant neoplasm of upper-inner quadrant of left breast in female, estrogen receptor positive (HCC)  Stage IA, p(T1cN0M0), ER+/PR+, HER2+, Grade III -Diagnosed in 03/2019. S/p left breast lumpectomy and SLNB on 04/04/19. 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 Roselind Messier -She began tamoxifen in 10/2019 and changed to anastrozole in 09/2020. Due to worsening joint pain and decreased grip strength. Eventually, she changed to exemestane in 05/2022 which she is tolerating well with only mild joint pain, mostly in the mornings.  -2022 she did undergo double mastectomy and reconstructive surgery.  -2022 - she underwent bilateral oophorectomy.    Plan: Labs reviewed  -CBC showing WBC 7.4; Hgb 13.9; Hct 39.5; Plt 379; Anc 3.8 -CMP - K 3.9; glucose 99; BUN 13; Creatinine 0.66; eGFR >60; Ca 9.4; LFTs normal.   No longer getting mammograms due to double mastectomy with reconstruction.  Will be due for bone density at next visit with GYN provider. Patient states this appointment will be near the end of 2024.  -she does have history of osteopenia which improved while she had treatment with Zometa.  Labs and follow up 6 months and sooner if needed .  The patient understands the plans discussed today and is in agreement with them.  She knows to contact our office  if she develops concerns prior to her next appointment.  I provided 25 minutes of face-to-face time during this encounter and > 50% was spent counseling as documented under my assessment and plan.    Carlean Jews, NP  [facility] - [facility] DEPT [facility] [address] [place] [address] Dept: [phone] Dept Fax: [phone]   No orders of the defined types were placed in this encounter.     CHIEF COMPLAINT:  CC: left breast cancer  Current Treatment:  -Zometa every 6 months starting 11/27/19 -Tamoxifen 20mg  daily starting 10/12/19. Switched to Anastrozole in 09/2020. Switched to Exemestane 05/2022 due to SE's from anastrozole (joint pain, decreased grip strength)  INTERVAL HISTORY:  Chelsea Hobbs is here today for repeat clinical assessment. She last saw Lacie, NP 11/16/2022. She has been doing well. Minimal side effects of exemestane, which consists of joint main, mostly in the morning. States that she rarely needs to take tylenol for the pain.   She denies fevers or chills. She denies pain. Her appetite is good. Her weight has been stable.  I have reviewed the past medical history, past surgical history, social history and family history with the patient and they are unchanged from previous note.  ALLERGIES:  is allergic to gadolinium derivatives, latex, tetracycline hcl, and wound dressing adhesive.  MEDICATIONS:  Current Outpatient Medications  Medication Sig Dispense Refill   albuterol (PROVENTIL HFA;VENTOLIN HFA) 108 (90 BASE) MCG/ACT inhaler Inhale 2 puffs into the lungs every 6 (six) hours as needed for shortness of breath.  ALPRAZolam (XANAX) 0.5 MG tablet Take 0.25 mg by mouth 2 (two) times daily as needed for anxiety.     Ascorbic Acid (VITAMIN C) 1000 MG tablet Take 2,000 mg by mouth daily. Lysine     Calcium-Magnesium-Vitamin D (CALCIUM MAGNESIUM PO) Take 1,200 mg by mouth daily.      carbidopa-levodopa (SINEMET IR) 25-100 MG tablet TAKE 1 TABLET BY MOUTH 3 (THREE) TIMES DAILY. 7AM/11AM/4PM 270 tablet 0   Cholecalciferol (VITAMIN D) 125 MCG (5000 UT) CAPS Take 5,000 Units by mouth daily.     docusate sodium (COLACE) 100 MG capsule Take 1 capsule (100 mg total) by mouth 2 (two) times daily. 60 capsule 2   exemestane (AROMASIN) 25 MG tablet TAKE 1 TABLET (25 MG TOTAL) BY MOUTH DAILY AFTER BREAKFAST. 90 tablet 1   fexofenadine (ALLEGRA) 180 MG tablet Take 180 mg by mouth daily as needed for allergies.     fluticasone (FLONASE) 50 MCG/ACT nasal spray Place 1 spray into both nostrils daily as needed for allergies.     ibuprofen (ADVIL) 600 MG tablet Take 1 tablet (600 mg total) by mouth every 6 (six) hours as needed. 30 tablet 0   Omega-3 1000 MG CAPS Take 1,000 mg by mouth daily.     SUMAtriptan (IMITREX) 100 MG tablet Take 100 mg by mouth every 2 (two) hours as needed for migraine.     venlafaxine XR (EFFEXOR-XR) 37.5 MG 24 hr capsule TAKE 1 CAPSULE BY MOUTH DAILY WITH BREAKFAST. 90 capsule 0   gabapentin (NEURONTIN) 100 MG capsule Take 2 capsules (200 mg total) by mouth at bedtime. 60 capsule 6   montelukast (SINGULAIR) 10 MG tablet Take 1 tablet by mouth at bedtime.     No current facility-administered medications for this visit.    HISTORY OF PRESENT ILLNESS:   Oncology History Overview Note  Cancer Staging Malignant neoplasm of upper-inner quadrant of left breast in female, estrogen receptor positive (HCC) Staging form: Breast, AJCC 8th Edition - Clinical stage from 03/22/2019: Stage IA (cT1c, cN0, cM0, G3, ER+, PR+, HER2+) - Signed by Malachy Mood, MD on 03/22/2019 Stage prefix: Initial diagnosis Histologic grading system: 3 grade system - Pathologic stage from 04/04/2019: Stage IA (pT1c, pN0, cM0, G3, ER+, PR+, HER2+) - Signed by Malachy Mood, MD on 04/21/2019 Stage prefix: Initial diagnosis Multigene prognostic tests performed: None Histologic grading system: 3 grade  system Residual tumor (R): R0 - None - Pathologic: No stage assigned - Unsigned    Malignant neoplasm of upper-inner quadrant of left breast in female, estrogen receptor positive (HCC)  03/10/2019 Mammogram   Diagnostic Mammogram 03/10/19  IMPRESSION: 1. Suspicious mass in the 10 o'clock position of the left breast, 3cm from the nipple measuring 12x7x98mm. Biopsy is indicated. 2. Indeterminate mass in the 5:30 o'clock position of the left breast, 5cm from the nipple, measuring 9x6x2mm. Although this may reflect a fibroadenoma, tissue sampling is recommended.   03/16/2019 Initial Biopsy   Diagnosis 03/16/19  1. 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 SEE NOTE 2. Breast, left, needle core biopsy, 5:30 o'clock - FIBROADENOMA   03/16/2019 Receptors her2   Results: IMMUNOHISTOCHEMICAL AND MORPHOMETRIC ANALYSIS PERFORMED MANUALLY The tumor cells are POSITIVE for Her2 (3+). Estrogen Receptor: 100%, POSITIVE, STRONG STAINING INTENSITY Progesterone Receptor: 100%, POSITIVE, STRONG STAINING INTENSITY Proliferation Marker Ki67: 60%   03/21/2019 Initial Diagnosis   Malignant neoplasm of upper-inner quadrant of left breast in female, estrogen receptor positive (HCC)   03/22/2019 Cancer Staging  Staging form: Breast, AJCC 8th Edition - Clinical stage from 03/22/2019: Stage IA (cT1c, cN0, cM0, G3, ER+, PR+, HER2+) - Signed by Malachy Mood, MD on 03/22/2019   03/31/2019 Genetic Testing   VUS in CTNNA1 called [id number] (Intronic) was identified on the [facility] Common Hereditary Cancers Panel. The Common Hereditary Cancers Panel offered by [facility] includes sequencing and/or deletion duplication testing of the following 47 genes: APC, ATM, AXIN2, BARD1, BMPR1A, BRCA1, BRCA2, BRIP1, CDH1, CDKN2A (p14ARF), CDKN2A (p16INK4a), CKD4, CHEK2, CTNNA1, DICER1, EPCAM (Deletion/duplication testing only), GREM1 (promoter region deletion/duplication testing only), KIT, MEN1, MLH1,  MSH2, MSH3, MSH6, MUTYH, NBN, NF1, NHTL1, PALB2, PDGFRA, PMS2, POLD1, POLE, PTEN, RAD50, RAD51C, RAD51D, SDHB, SDHC, SDHD, SMAD4, SMARCA4. STK11, TP53, TSC1, TSC2, and VHL.  The following genes were evaluated for sequence changes only: SDHA and HOXB13 c.251G>A variant only. The report date is 03/30/2019.    03/31/2019 Breast MRI   MRI breast 03/31/19  IMPRESSION: 1. 5 millimeter enhancing oval mass in the anteromedial aspect of the RIGHT breast warranting further evaluation. Given its anterior depth and visibility on noncontrast images, ultrasound is recommended to determine if there is a sonographic correlate. If no sonographic correlate is identified to guide biopsy, MRI biopsy of the RIGHT breast is recommended. 2. Known malignancy in the UPPER INNER QUADRANT of the LEFT breast measuring 1.0 centimeters. 3. 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   04/04/2019 Surgery   RADIOCATIVE SEED GUIDED LEFT BREAST LUMPECTOMY, LEFT AXILLARY SENTINEL LYMPH NODE BIOPSY and PAC placement  By Dr. Luisa Hart  04/04/19    04/04/2019 Pathology Results   DIAGNOSIS: 04/04/19  A. BREAST, LEFT, LUMPECTOMY:  -  Invasive ductal carcinoma, Nottingham grade 3 of 3, 1.3 cm  -  Ductal carcinoma in-situ, intermediate grade  -  Margins uninvolved by carcinoma (0.1 cm; posterior margin; see  comment)  -  Previous biopsy site changes present  -  See oncology table and comment below   B. BREAST, LEFT, ADDITIONAL MEDIAL MARGIN, EXCISION:  -  Fibrocystic changes  -  No residual carcinoma identified   C. LYMPH NODE, LEFT AXILLARY, SENTINEL, BIOPSY:  -   No carcinoma identified in one lymph node (0/1)    04/04/2019 Cancer Staging   Staging form: Breast, AJCC 8th Edition - Pathologic stage from 04/04/2019: Stage IA (pT1c, pN0, cM0, G3, ER+, PR+, HER2+) - Signed by Malachy Mood, MD on 04/21/2019   04/11/2019 Pathology Results   Diagnosis Breast, right, needle core biopsy, 2 o'clock -  SMALL INTRADUCTAL PAPILLOMA. SEE NOTE - SMALL FIBROADENOMA - NEGATIVE FOR CARCINOMA   05/12/2019 - 06/10/2020 Chemotherapy   Adjuvant Weekly Taxol and Herceptin for 12 weeks starting 05/12/19, switched to Abraxane on 08/04/19 due to skin rash. Completed on 08/11/19.  Followed by maintenance Herceptin q3weeks to complete 1 year treatment (from 05/12/19) starting 09/01/19-06/10/20   08/29/2019 - 09/26/2019 Radiation Therapy   Adjuvant radiation per Dr. Roselind Messier    10/12/2019 -  Anti-estrogen oral therapy   Tamoxifen 20mg  daily starting 10/12/19   11/27/2019 Survivorship   SCP delivered by Santiago Glad, NP    03/15/2020 Mammogram   IMPRESSION: 1. Expected surgical changes at the lumpectomy site in the central posterior left breast.   2. No suspicious changes at the site of the biopsied papilloma in the lower inner retroareolar right breast (ribbon clip). Surgical excision is planned for this lesion.   3.  No evidence of malignancy in the bilateral breasts.   04/17/2020 Surgery  RIGHT BREAST LUMPECTOMY WITH RADIOACTIVE SEED LOCALIZATION and PAC removal by Dr Luisa Hart   FINAL MICROSCOPIC DIAGNOSIS:   A. BREAST, RIGHT, LUMPECTOMY:  - Biopsy clip.  Fibroadenoma.  Fibroadenomatoid change.  Fibrocystic  change.     08/2020 Surgery   Bilateral mastectomy with reconstruction   01/2021 Surgery   Bilateral salpingo-oophorectomy        REVIEW OF SYSTEMS:   Constitutional: Denies fevers, chills or abnormal weight loss Eyes: Denies blurriness of vision Ears, nose, mouth, throat, and face: Denies mucositis or sore throat Respiratory: Denies cough, dyspnea or wheezes Cardiovascular: Denies palpitation, chest discomfort or lower extremity swelling Gastrointestinal:  Denies nausea, heartburn or change in bowel habits Skin: Denies abnormal skin rashes Lymphatics: Denies new lymphadenopathy or easy bruising Neurological:Denies numbness, tingling or new weaknesses Behavioral/Psych: Mood is stable,  no new changes  Musculoskeletal - intermittent joint pain which is most severe in the mornings.  All other systems were reviewed with the patient and are negative.   VITALS:   Today's Vitals   05/24/23 0953 05/24/23 1000  BP:  131/85  Pulse:  72  Resp:  14  Temp:  97.6 F (36.4 C)  TempSrc:  Temporal  SpO2:  100%  Weight:  142 lb (64.4 kg)  PainSc: 0-No pain    Body mass index is 25.97 kg/m.   Wt Readings from Last 3 Encounters:  05/24/23 142 lb (64.4 kg)  11/16/22 142 lb 14.4 oz (64.8 kg)  10/26/22 143 lb 12.8 oz (65.2 kg)    Body mass index is 25.97 kg/m.  Performance status (ECOG): 1 - Symptomatic but completely ambulatory  PHYSICAL EXAM:   GENERAL:alert, no distress and comfortable SKIN: skin color, texture, turgor are normal, no rashes or significant lesions EYES: normal, Conjunctiva are pink and non-injected, sclera clear OROPHARYNX:no exudate, no erythema and lips, buccal mucosa, and tongue normal  NECK: supple, thyroid normal size, non-tender, without nodularity LYMPH:  no palpable lymphadenopathy in the cervical, axillary or inguinal LUNGS: clear to auscultation and percussion with normal breathing effort HEART: regular rate & rhythm and no murmurs and no lower extremity edema ABDOMEN:abdomen soft, non-tender and normal bowel sounds Musculoskeletal:no cyanosis of digits and no clubbing  NEURO: alert & oriented x 3 with fluent speech, no focal motor/sensory deficits BREAST - bilateral mastectomies with reconstruction. Well healed surgical scars. Nipples are surgically absent, bilaterally. No palpable masses or axillary lymphadenopathy on either side appreciated today .  LABORATORY DATA:  I have reviewed the data as listed    Component Value Date/Time   NA 139 05/24/2023 0932   K 3.9 05/24/2023 0932   CL 102 05/24/2023 0932   CO2 31 05/24/2023 0932   GLUCOSE 99 05/24/2023 0932   BUN 13 05/24/2023 0932   CREATININE 0.66 05/24/2023 0932   CALCIUM 9.4  05/24/2023 0932   PROT 7.4 05/24/2023 0932   ALBUMIN 4.5 05/24/2023 0932   AST 23 05/24/2023 0932   ALT 6 05/24/2023 0932   ALKPHOS 80 05/24/2023 0932   BILITOT 0.4 05/24/2023 0932   GFRNONAA >60 05/24/2023 0932   GFRAA >60 03/19/2020 1438    Lab Results  Component Value Date   WBC 7.4 05/24/2023   NEUTROABS 3.8 05/24/2023   HGB 13.9 05/24/2023   HCT 39.5 05/24/2023   MCV 89.4 05/24/2023   PLT 379 05/24/2023

## 2023-05-24 ENCOUNTER — Encounter: Payer: Self-pay | Admitting: Nurse Practitioner

## 2023-05-24 ENCOUNTER — Inpatient Hospital Stay: Payer: BC Managed Care – PPO | Attending: Hematology

## 2023-05-24 ENCOUNTER — Inpatient Hospital Stay (HOSPITAL_BASED_OUTPATIENT_CLINIC_OR_DEPARTMENT_OTHER): Payer: BC Managed Care – PPO | Admitting: Nurse Practitioner

## 2023-05-24 VITALS — BP 131/85 | HR 72 | Temp 97.6°F | Resp 14 | Wt 142.0 lb

## 2023-05-24 DIAGNOSIS — Z170 Estrogen receptor positive status [ER+]: Secondary | ICD-10-CM

## 2023-05-24 DIAGNOSIS — C50212 Malignant neoplasm of upper-inner quadrant of left female breast: Secondary | ICD-10-CM | POA: Diagnosis not present

## 2023-05-24 DIAGNOSIS — Z79811 Long term (current) use of aromatase inhibitors: Secondary | ICD-10-CM | POA: Insufficient documentation

## 2023-05-24 DIAGNOSIS — Z9013 Acquired absence of bilateral breasts and nipples: Secondary | ICD-10-CM | POA: Insufficient documentation

## 2023-05-24 DIAGNOSIS — Z79899 Other long term (current) drug therapy: Secondary | ICD-10-CM | POA: Insufficient documentation

## 2023-05-24 LAB — CBC WITH DIFFERENTIAL (CANCER CENTER ONLY)
Abs Immature Granulocytes: 0.03 10*3/uL (ref 0.00–0.07)
Basophils Absolute: 0.1 10*3/uL (ref 0.0–0.1)
Basophils Relative: 1 %
Eosinophils Absolute: 0.3 10*3/uL (ref 0.0–0.5)
Eosinophils Relative: 4 %
HCT: 39.5 % (ref 36.0–46.0)
Hemoglobin: 13.9 g/dL (ref 12.0–15.0)
Immature Granulocytes: 0 %
Lymphocytes Relative: 35 %
Lymphs Abs: 2.6 10*3/uL (ref 0.7–4.0)
MCH: 31.4 pg (ref 26.0–34.0)
MCHC: 35.2 g/dL (ref 30.0–36.0)
MCV: 89.4 fL (ref 80.0–100.0)
Monocytes Absolute: 0.6 10*3/uL (ref 0.1–1.0)
Monocytes Relative: 8 %
Neutro Abs: 3.8 10*3/uL (ref 1.7–7.7)
Neutrophils Relative %: 52 %
Platelet Count: 379 10*3/uL (ref 150–400)
RBC: 4.42 MIL/uL (ref 3.87–5.11)
RDW: 12.4 % (ref 11.5–15.5)
WBC Count: 7.4 10*3/uL (ref 4.0–10.5)
nRBC: 0 % (ref 0.0–0.2)

## 2023-05-24 LAB — CMP (CANCER CENTER ONLY)
ALT: 6 U/L (ref 0–44)
AST: 23 U/L (ref 15–41)
Albumin: 4.5 g/dL (ref 3.5–5.0)
Alkaline Phosphatase: 80 U/L (ref 38–126)
Anion gap: 6 (ref 5–15)
BUN: 13 mg/dL (ref 6–20)
CO2: 31 mmol/L (ref 22–32)
Calcium: 9.4 mg/dL (ref 8.9–10.3)
Chloride: 102 mmol/L (ref 98–111)
Creatinine: 0.66 mg/dL (ref 0.44–1.00)
GFR, Estimated: >60 mL/min (ref >60)
Glucose, Bld: 99 mg/dL (ref 70–99)
Potassium: 3.9 mmol/L (ref 3.5–5.1)
Sodium: 139 mmol/L (ref 135–145)
Total Bilirubin: 0.4 mg/dL (ref <1.2)
Total Protein: 7.4 g/dL (ref 6.5–8.1)

## 2023-06-02 ENCOUNTER — Encounter: Payer: Self-pay | Admitting: Hematology

## 2023-06-02 NOTE — Telephone Encounter (Signed)
Telephone call  

## 2023-07-23 DIAGNOSIS — F411 Generalized anxiety disorder: Secondary | ICD-10-CM | POA: Diagnosis not present

## 2023-07-23 DIAGNOSIS — M810 Age-related osteoporosis without current pathological fracture: Secondary | ICD-10-CM | POA: Diagnosis not present

## 2023-07-23 DIAGNOSIS — Z0000 Encounter for general adult medical examination without abnormal findings: Secondary | ICD-10-CM | POA: Diagnosis not present

## 2023-07-23 DIAGNOSIS — Z13220 Encounter for screening for lipoid disorders: Secondary | ICD-10-CM | POA: Diagnosis not present

## 2023-07-23 NOTE — Progress Notes (Deleted)
 Assessment/Plan:   1.  Parkinsons Disease, diagnosed January 2024  -She will continue carbidopa /levodopa  25/100, 1 tablet 3 times per day.  -she is doing burn boot camp 3 days per week  -We discussed that it used to be thought that levodopa  would increase risk of melanoma but now it is believed that Parkinsons itself likely increases risk of melanoma. she is to get regular skin checks.  -will get her connected with LCSW for [employer] and community education programs  2.  GAD  -On Effexor   Subjective:   Chelsea Hobbs was seen today in follow up for Parkinsons disease.  My previous records were reviewed prior to todays visit as well as outside records available to me.  I haven't seen the patient since April.  She is still on carbidopa /levodopa  and doing well.  Pt denies falls.  Pt denies lightheadedness, near syncope.  No hallucinations.  Mood has been anxious.  Pcp started her on wellbutrin on Friday.  Has xanax  for prn usage.  Current prescribed movement disorder medications: Carbidopa /levodopa  25/1 tablet 3 times per day   PREVIOUS MEDICATIONS: Sinemet   ALLERGIES:   Allergies  Allergen Reactions   Gadolinium Derivatives Hives and Other (See Comments)    Pt started wheezing after contrast/ runny nose/ 1 hive on arm/ Dr Minus checked pt condition/ pt has an inhaler so used her inhaler and felt better/ no medication given / pt was instructed to wait at [facility] for 30 min to be reassessed by Dr.//jv    Latex Dermatitis   Tetracycline Hcl Diarrhea   Wound Dressing Adhesive Rash    Dermabond    CURRENT MEDICATIONS:  No outpatient medications have been marked as taking for the 07/26/23 encounter (Appointment) with Johnathyn Viscomi, [place] RAMAN, DO.     Objective:   PHYSICAL EXAMINATION:    VITALS:  There were no vitals filed for this visit.  GEN:  The patient appears stated age and is in NAD. HEENT:  Normocephalic, atraumatic.  The mucous membranes are moist. The superficial  temporal arteries are without ropiness or tenderness. CV:  RRR Lungs:  CTAB Neck/HEME:  There are no carotid bruits bilaterally.  Neurological examination:  Orientation: The patient is alert and oriented x3. Cranial nerves: There is good facial symmetry without facial hypomimia. The speech is fluent and clear. Soft palate rises symmetrically and there is no tongue deviation. Hearing is intact to conversational tone. Sensation: Sensation is intact to light touch throughout Motor: Strength is at least antigravity x4.  Movement examination: Tone: There is normal tone in the UE/LE (marked improvement) Abnormal movements: normal Coordination:  There is no decremation with RAM's, with any form of RAMS, including alternating supination and pronation of the forearm, hand opening and closing, finger taps, heel taps and toe taps.  Gait and Station: The patient has no difficulty arising out of a deep-seated chair without the use of the hands. The patient's stride length is normal.    I have reviewed and interpreted the following labs independently    Chemistry      Component Value Date/Time   NA 139 05/24/2023 0932   K 3.9 05/24/2023 0932   CL 102 05/24/2023 0932   CO2 31 05/24/2023 0932   BUN 13 05/24/2023 0932   CREATININE 0.66 05/24/2023 0932      Component Value Date/Time   CALCIUM 9.4 05/24/2023 0932   ALKPHOS 80 05/24/2023 0932   AST 23 05/24/2023 0932   ALT 6 05/24/2023 0932   BILITOT  0.4 05/24/2023 0932       Lab Results  Component Value Date   WBC 7.4 05/24/2023   HGB 13.9 05/24/2023   HCT 39.5 05/24/2023   MCV 89.4 05/24/2023   PLT 379 05/24/2023    Lab Results  Component Value Date   TSH 0.790 06/10/2020   Total time spent on today's visit was *** minutes, including both face-to-face time and nonface-to-face time.  Time included that spent on review of records (prior notes available to me/labs/imaging if pertinent), discussing treatment and goals, answering  patient's questions and coordinating care.    Cc:  Husain, Karrar, MD

## 2023-07-25 ENCOUNTER — Encounter: Payer: Self-pay | Admitting: Neurology

## 2023-07-26 ENCOUNTER — Ambulatory Visit: Payer: BC Managed Care – PPO | Admitting: Neurology

## 2023-07-31 ENCOUNTER — Other Ambulatory Visit: Payer: Self-pay | Admitting: Neurology

## 2023-07-31 DIAGNOSIS — G20A1 Parkinson's disease without dyskinesia, without mention of fluctuations: Secondary | ICD-10-CM

## 2023-08-08 ENCOUNTER — Other Ambulatory Visit: Payer: Self-pay | Admitting: Hematology

## 2023-08-09 ENCOUNTER — Encounter: Payer: Self-pay | Admitting: Hematology

## 2023-08-10 ENCOUNTER — Other Ambulatory Visit: Payer: Self-pay | Admitting: Nurse Practitioner

## 2023-08-20 NOTE — Progress Notes (Signed)
 Assessment/Plan:   1.  Parkinsons Disease, diagnosed January 2024  -She will continue carbidopa /levodopa  25/100, 1 tablet 3 times per day.  -she is doing burn boot camp 3 days per week  -We discussed that it used to be thought that levodopa  would increase risk of melanoma but now it is believed that Parkinsons itself likely increases risk of melanoma. she is to get regular skin checks.  She is doing that.  She sees [facility]  2.  GAD  -PCP just started on wellbutrin (and d/c effexor )  3.  Adjustment d/o  -lots of loss lately.  Talked to her about taking advantage of hospice grief services and she is considering that now.    Subjective:   Chelsea Hobbs was seen today in follow up for Parkinsons disease.  My previous records were reviewed prior to todays visit as well as outside records available to me.  I haven't seen the patient since last April.  She is still on carbidopa /levodopa  and doing well.  Pt denies falls.  Pt denies lightheadedness, near syncope.  No hallucinations.  Mood has been anxious/depressed.  Pcp started her on wellbutrin on Friday.  Has xanax  for prn usage.  Her mom and father in law recently passed and husband had MI.  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  Current prescribed movement disorder medications: Carbidopa /levodopa  25/1 tablet 3 times per day   PREVIOUS MEDICATIONS: Sinemet   ALLERGIES:   Allergies  Allergen Reactions   Gadolinium Derivatives Hives and Other (See Comments)    Pt started wheezing after contrast/ runny nose/ 1 hive on arm/ Dr Garon Kaiser checked pt condition/ pt has an inhaler so used her inhaler and felt better/ no medication given / pt was instructed to wait at [facility] for 30 min to be reassessed by Dr.//jv    Latex Dermatitis   Tetracycline Hcl Diarrhea   Wound Dressing Adhesive Rash    Dermabond    CURRENT MEDICATIONS:  Current Meds  Medication Sig   albuterol   (PROVENTIL  HFA;VENTOLIN  HFA) 108 (90 BASE) MCG/ACT inhaler Inhale 2 puffs into the lungs every 6 (six) hours as needed for shortness of breath.   ALPRAZolam  (XANAX ) 0.5 MG tablet Take 0.25 mg by mouth 2 (two) times daily as needed for anxiety.   Ascorbic Acid (VITAMIN C) 1000 MG tablet Take 2,000 mg by mouth daily. Lysine   buPROPion (WELLBUTRIN XL) 150 MG 24 hr tablet Take 150 mg by mouth daily.   Calcium-Magnesium-Vitamin D (CALCIUM MAGNESIUM PO) Take 1,200 mg by mouth daily.   carbidopa -levodopa  (SINEMET  IR) 25-100 MG tablet TAKE 1 TABLET BY MOUTH 3 (THREE) TIMES DAILY. 7AM/11AM/4PM   Cholecalciferol (VITAMIN D) 125 MCG (5000 UT) CAPS Take 5,000 Units by mouth daily.   docusate sodium  (COLACE) 100 MG capsule Take 1 capsule (100 mg total) by mouth 2 (two) times daily.   exemestane  (AROMASIN ) 25 MG tablet TAKE 1 TABLET (25 MG TOTAL) BY MOUTH DAILY AFTER BREAKFAST.   fexofenadine (ALLEGRA) 180 MG tablet Take 180 mg by mouth daily as needed for allergies.   fluticasone  (FLONASE ) 50 MCG/ACT nasal spray Place 1 spray into both nostrils daily as needed for allergies.   ibuprofen  (ADVIL ) 600 MG tablet Take 1 tablet (600 mg total) by mouth every 6 (six) hours as needed.   Omega-3 1000 MG CAPS Take 1,000 mg by mouth daily.   SUMAtriptan  (IMITREX ) 100 MG tablet Take 100 mg by mouth every 2 (  two) hours as needed for migraine.     Objective:   PHYSICAL EXAMINATION:    VITALS:   Vitals:   08/23/23 0855  BP: 118/82  Pulse: 80  SpO2: 98%  Height: 5\' 2"  (1.575 m)    GEN:  The patient appears stated age and is in NAD. HEENT:  Normocephalic, atraumatic.  The mucous membranes are moist. The superficial temporal arteries are without ropiness or tenderness. CV:  RRR Lungs:  CTAB Neck/HEME:  There are no carotid bruits bilaterally.  Neurological examination:  Orientation: The patient is alert and oriented x3. Cranial nerves: There is good facial symmetry without facial hypomimia. The speech is  fluent and clear. Soft palate rises symmetrically and there is no tongue deviation. Hearing is intact to conversational tone. Sensation: Sensation is intact to light touch throughout Motor: Strength is at least antigravity x4.  Movement examination: Tone: There is normal tone in the UE/LE  Abnormal movements: normal Coordination:  There is no decremation with RAM's, with any form of RAMS, including alternating supination and pronation of the forearm, hand opening and closing, finger taps.  Slight decreased toe taps on the R Gait and Station: The patient has no difficulty arising out of a deep-seated chair without the use of the hands. The patient's stride length is normal.    I have reviewed and interpreted the following labs independently    Chemistry      Component Value Date/Time   NA 139 05/24/2023 0932   K 3.9 05/24/2023 0932   CL 102 05/24/2023 0932   CO2 31 05/24/2023 0932   BUN 13 05/24/2023 0932   CREATININE 0.66 05/24/2023 0932      Component Value Date/Time   CALCIUM 9.4 05/24/2023 0932   ALKPHOS 80 05/24/2023 0932   AST 23 05/24/2023 0932   ALT 6 05/24/2023 0932   BILITOT 0.4 05/24/2023 0932       Lab Results  Component Value Date   WBC 7.4 05/24/2023   HGB 13.9 05/24/2023   HCT 39.5 05/24/2023   MCV 89.4 05/24/2023   PLT 379 05/24/2023    Lab Results  Component Value Date   TSH 0.790 06/10/2020      Cc:  Jearldine Mina, MD

## 2023-08-23 ENCOUNTER — Ambulatory Visit (INDEPENDENT_AMBULATORY_CARE_PROVIDER_SITE_OTHER): Payer: BC Managed Care – PPO | Admitting: Neurology

## 2023-08-23 VITALS — BP 118/82 | HR 80 | Ht 62.0 in

## 2023-08-23 DIAGNOSIS — G20A1 Parkinson's disease without dyskinesia, without mention of fluctuations: Secondary | ICD-10-CM | POA: Diagnosis not present

## 2023-08-23 DIAGNOSIS — F4323 Adjustment disorder with mixed anxiety and depressed mood: Secondary | ICD-10-CM | POA: Diagnosis not present

## 2023-08-23 NOTE — Patient Instructions (Signed)
 Local and Online Resources for Power over Parkinson's Group?  Febraury 2025 ?  [place] PARKINSON'S GROUPS??  Power over Parkinson's Group:???  Upcoming Power over [employer] Meetings/Care Partner Support:? 2nd Wednesdays of the month at 2 pm:  February 12th, March 12th Contact Chelsea Hobbs at [email or web address] or Chelsea Hobbs at [email or web address] if interested in participating in this group?  ?  LOCAL EVENTS AND NEW OFFERINGS?  Dance Project Spring 2025:  January 14-May 20, Tuesdays 10-11 am.  All details on website: [email or web address] ACT FITNESS Chair Yoga classes "Train and Gain", Fridays 10 am, [employer].  Contact Chelsea Hobbs at [phone].   PWR! Moves [place] class!  Wednesdays at 10 am.  Please contact Chelsea Hobbs, PT at [email or web address] if interested. Health visitor Classes offering at [employer]!? Tuesdays (Chair Yoga)  and Wednesdays (PWR! Moves)  1:00 pm.?? Contact Chelsea Hobbs [phone] or [email or web address] Drumming for Parkinson's will be held on 2nd and 4th Mondays at 11:00 am.?? Located at the [place] of the [place] ([address] [place] ? Contact Chelsea Hobbs at [email or web address] or [phone]?  [employer] Parkinson's Tai Chi Class, Mondays at 11 am.  Call [phone] for details  [facility] at [facility]- [address], [place] Wednesdays- 4:00 - 5:00 PM - specifically for Parkinson's Disease.  Free!  Contact Chelsea Hobbs, Chelsea - [phone] (clinic) or  [phone] (cell) or by email: [email or web address]   ?ONLINE EDUCATION AND SUPPORT?  Parkinson Foundation:? www.parkinson.org?  PD Health at Home continues:? Mindfulness Mondays, Wellness Wednesdays, Fitness Fridays??  Upcoming Education:??  Learn More, Live Better.  Parkinson's Symposium Scientist, water quality and  [place], [place]).   Saturday, February 8th, 9:30-2:00 Staying Connected:  Nurturing Wellness beyond [employer]. Wednesday, February 12th, 1-2 pm Impulse Control Disorders:  Understanding and Managing the Challenges.  Wednesday, February 19th, 1-2 pm Veterans and Parkinson's:  Chelsea Hobbs.  Thursday, February 27th, 2:30-4 pm Care Partner Conversations.  Wednesday, March 5th, 1-2 pm Expert Briefing:    Nourishing Wellness-Nutrition for [employer].  Wednesday, March 12th, 1-2 pm. Register for virtual education and Photographer (webinars) at [employer].gl  Please check out their website to sign up for emails and see their full online offerings??  ?  [employer]:? www.michaeljfox.org??  Third Thursday Webinars:? On the third Thursday of every month at 12 p.m. ET, join our free live webinars to learn about various aspects of living with Parkinson's disease and our work to speed medical breakthroughs.?  Upcoming Webinar:? How Government Policies Impact Parkinson's Research:  Wins and Next Steps.  Thursday, February 20th at 12 noon.  Check out additional information on their website to see their full online offerings?  ?  [employer]:? www.davisphinneyfoundation.org?  Upcoming Webinar:   Overcoming Hurdles to Exercise.  Tuesday, February 18th at 5 pm Series:? Living with Parkinson's Meetup.?? Third Thursdays each month, 3 pm?  Care Partner Monthly Meetup.? With Chelsea Hobbs.? First Tuesday of each month, 2 pm?  Check out additional information to Live Well Today on their website?  ?  Parkinson and Movement Disorders (PMD) Alliance:? www.pmdalliance.org?  [employer] Online:? Online Education Events?  Sign up for emails, which are sent weekly to give you updates on programming and online offerings?  ?  Parkinson's Association of [place]:? www.parkinsonassociation.org?  Information on online support groups,  education events, and online exercises including Yoga, Parkinson's exercises and more-LOTS of information on links to PD resources and online events?  Virtual Support Group through  Parkinson's Association of the [place]-First Wednesday of each month at 2 pm   MOVEMENT AND EXERCISE OPPORTUNITIES?  PWR! Moves [place] class continues!  Wednesdays at 10 am.  Please contact Chelsea Hobbs, PT at [email or web address] if interested. Parkinson's Exercise Class offerings at [employer]. Tuesdays (Chair yoga) and Wednesdays (PWR! Moves)  1:00 pm.?  Contact Chelsea Hobbs [phone] or [email or web address]  Parkinson's Wellness Recovery (PWR! Moves)? www.pwr4life.org?  Info on the PWR! Virtual Experience:? You will have access to our expertise?through self-assessment, guided plans that start with the PD-specific fundamentals, educational content, tips, Q&A with an expert, and a growing Engineering geologist of PD-specific pre-recorded and live exercise classes of varying types and intensity - both physical and cognitive! If that is not enough, we offer 1:1 wellness consultations (in-person or virtual) to personalize your PWR! Dance movement psychotherapist.??  Parkinson [employer] Fridays:??  As part of the PD Health @ Home program, this free video series focuses each week on one aspect of fitness designed to support people living with Parkinson's.? These weekly videos highlight the Parkinson Foundation fitness guidelines for people with Parkinson's disease.?  MenusLocal.com.br?  Dance for PD website is offering free, live-stream classes throughout the week, as well as links to [employer] of classes:? https://danceforparkinsons.org/?  Virtual dance and Pilates for Parkinson's classes: Click on the Community Tab> Parkinson's Movement Initiative Tab.? To register for classes and for more information, visit www.[email or web address].co.za and click the "community"  tab.??  [employer] Parkinson's Cycling Classes??  [facility] [employer]:? Thursdays @ Noon-Live classes at [employer] ([employer] at [email or web address]?or [phone])?  [facility]: Classes Tuesday, Wednesday and Thursday (contact [place] at [email or web address] ?or [phone])?  [employer]?  Varied levels of classes are offered Tuesdays and Thursdays at [facility].??  Stretching with Byrd Hesselbach weekly class is also offered for people with Parkinson's?  To observe a class or for more information, call [phone] or email Patricia Nettle at [email or web address]?    ADDITIONAL SUPPORT AND RESOURCES?  Well[employer]Spring [employer]:  Chiropractor:? www.well-springsolutions.org/caregiver-education/caregiver-support-group.? You may also contact Loleta Chance at [facility] [facility] -spring.org or [phone].????  Well-Spring Navigator:? Just1Navigator program, a?free service to help individuals and families through the journey of determining care for older adults.? The "Navigator" is a Child psychotherapist, Sidney Ace, who will speak with a prospective client and/or loved ones to provide an assessment of the situation and a set of recommendations for a personalized care plan -- all free of charge, and whether?[employer]Spring [employer] offers the needed service or not. If the need is not a service we provide, we are well-connected with reputable programs in town that we can refer you to.? www.well-springsolutions.org or to speak with the Navigator, call [phone].?

## 2023-09-13 DIAGNOSIS — D225 Melanocytic nevi of trunk: Secondary | ICD-10-CM | POA: Diagnosis not present

## 2023-09-13 DIAGNOSIS — L814 Other melanin hyperpigmentation: Secondary | ICD-10-CM | POA: Diagnosis not present

## 2023-09-13 DIAGNOSIS — L570 Actinic keratosis: Secondary | ICD-10-CM | POA: Diagnosis not present

## 2023-09-13 DIAGNOSIS — C44319 Basal cell carcinoma of skin of other parts of face: Secondary | ICD-10-CM | POA: Diagnosis not present

## 2023-09-13 DIAGNOSIS — L820 Inflamed seborrheic keratosis: Secondary | ICD-10-CM | POA: Diagnosis not present

## 2023-09-13 DIAGNOSIS — L578 Other skin changes due to chronic exposure to nonionizing radiation: Secondary | ICD-10-CM | POA: Diagnosis not present

## 2023-09-17 DIAGNOSIS — C441191 Basal cell carcinoma of skin of left upper eyelid, including canthus: Secondary | ICD-10-CM | POA: Diagnosis not present

## 2023-09-20 DIAGNOSIS — Z13820 Encounter for screening for osteoporosis: Secondary | ICD-10-CM | POA: Diagnosis not present

## 2023-09-20 DIAGNOSIS — Z01419 Encounter for gynecological examination (general) (routine) without abnormal findings: Secondary | ICD-10-CM | POA: Diagnosis not present

## 2023-09-20 DIAGNOSIS — Z6826 Body mass index (BMI) 26.0-26.9, adult: Secondary | ICD-10-CM | POA: Diagnosis not present

## 2023-10-08 ENCOUNTER — Ambulatory Visit: Payer: BC Managed Care – PPO | Admitting: Neurology

## 2023-10-27 ENCOUNTER — Other Ambulatory Visit: Payer: Self-pay | Admitting: Neurology

## 2023-10-27 DIAGNOSIS — G20A1 Parkinson's disease without dyskinesia, without mention of fluctuations: Secondary | ICD-10-CM

## 2023-11-17 DIAGNOSIS — R059 Cough, unspecified: Secondary | ICD-10-CM | POA: Diagnosis not present

## 2023-11-17 DIAGNOSIS — R519 Headache, unspecified: Secondary | ICD-10-CM | POA: Diagnosis not present

## 2023-11-17 DIAGNOSIS — R070 Pain in throat: Secondary | ICD-10-CM | POA: Diagnosis not present

## 2023-11-19 ENCOUNTER — Other Ambulatory Visit: Payer: Self-pay

## 2023-11-19 DIAGNOSIS — C50212 Malignant neoplasm of upper-inner quadrant of left female breast: Secondary | ICD-10-CM

## 2023-11-22 ENCOUNTER — Inpatient Hospital Stay: Payer: BC Managed Care – PPO | Admitting: Nurse Practitioner

## 2023-11-22 ENCOUNTER — Telehealth: Payer: Self-pay

## 2023-11-22 ENCOUNTER — Telehealth: Payer: Self-pay | Admitting: Nurse Practitioner

## 2023-11-22 ENCOUNTER — Inpatient Hospital Stay: Payer: BC Managed Care – PPO

## 2023-11-22 NOTE — Assessment & Plan Note (Deleted)
 Stage IA, p(T1cN0M0), ER+/PR+, HER2+, Grade III -Diagnosed in 03/2019. S/p left breast lumpectomy and SLNB on 04/04/19. 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 Eloise Hake -She began tamoxifen  in 10/2019 and changed to anastrozole  in 09/2020. Due to worsening joint pain and decreased grip strength. Eventually, she changed to exemestane  in 05/2022 which she is tolerating well with only mild joint pain, mostly in the mornings.  -2022 she did undergo double mastectomy and reconstructive surgery.  -2022 - she underwent bilateral oophorectomy.

## 2023-11-22 NOTE — Progress Notes (Deleted)
 Patient Care Team: Jearldine Mina, MD as PCP - General (Internal Medicine) Alane Hsu, RN as Oncology Nurse Navigator Auther Bo, RN as Oncology Nurse Navigator Sim Dryer, MD as Consulting Physician (General Surgery) Sonja Leilani [place], MD as Consulting Physician (Hematology) Retta Caster, MD as Consulting Physician (Radiation Oncology) Burton, Lacie K, NP as Nurse Practitioner (Nurse Practitioner)  Clinic Day:  11/22/2023  Referring physician: Jearldine Mina, MD  ASSESSMENT & PLAN:   Assessment & Plan: Malignant neoplasm of upper-inner quadrant of left breast in female, estrogen receptor positive (HCC)  Stage IA, p(T1cN0M0), ER+/PR+, HER2+, Grade III -Diagnosed in 03/2019. S/p left breast lumpectomy and SLNB on 04/04/19. 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 Eloise Hake -She began tamoxifen  in 10/2019 and changed to anastrozole  in 09/2020. Due to worsening joint pain and decreased grip strength. Eventually, she changed to exemestane  in 05/2022 which she is tolerating well with only mild joint pain, mostly in the mornings.  -2022 she did undergo double mastectomy and reconstructive surgery.  -2022 - she underwent bilateral oophorectomy.       The patient understands the plans discussed today and is in agreement with them.  She knows to contact our office if she develops concerns prior to her next appointment.  I provided *** minutes of face-to-face time during this encounter and > 50% was spent counseling as documented under my assessment and plan.    Sharyon Deis, NP  [facility] - A DEPT OF [facility]. [facility] [address] [place] [address] Dept: [phone] Dept Fax: [phone]   No orders of the defined types were placed in this encounter.     CHIEF COMPLAINT:  CC:  left breast cancer, estrogen receptor positive.   Current Treatment:  exemestane  05/2022 due to negative SEs of anastrozole . Initially started tamoxifen  20 mg daily 10/12/2019.   INTERVAL HISTORY:  Savanah is here today for repeat clinical assessment. She denies fevers or chills. She was last seen by myself on 05/24/2023. She has had bilateral mastectomy with reconstructive surgery.  She denies pain. Her appetite is good. Her weight {Weight change:10426}.  I have reviewed the past medical history, past surgical history, social history and family history with the patient and they are unchanged from previous note.  ALLERGIES:  is allergic to gadolinium derivatives, latex, tetracycline hcl, and wound dressing adhesive.  MEDICATIONS:  Current Outpatient Medications  Medication Sig Dispense Refill   albuterol  (PROVENTIL  HFA;VENTOLIN  HFA) 108 (90 BASE) MCG/ACT inhaler Inhale 2 puffs into the lungs every 6 (six) hours as needed for shortness of breath.     ALPRAZolam  (XANAX ) 0.5 MG tablet Take 0.25 mg by mouth 2 (two) times daily as needed for anxiety.     Ascorbic Acid (VITAMIN C) 1000 MG tablet Take 2,000 mg by mouth daily. Lysine     buPROPion (WELLBUTRIN XL) 150 MG 24 hr tablet Take 150 mg by mouth daily.     Calcium-Magnesium-Vitamin D (CALCIUM MAGNESIUM PO) Take 1,200 mg by mouth daily.     carbidopa -levodopa  (SINEMET  IR) 25-100 MG tablet TAKE 1 TABLET BY MOUTH 3 (THREE) TIMES DAILY. 7AM/11AM/4PM 270 tablet 0   Cholecalciferol (VITAMIN D) 125 MCG (5000 UT) CAPS Take 5,000 Units by mouth daily.     docusate sodium  (COLACE) 100 MG capsule Take 1 capsule (100 mg  total) by mouth 2 (two) times daily. 60 capsule 2   exemestane  (AROMASIN ) 25 MG tablet TAKE 1 TABLET (25 MG TOTAL) BY MOUTH DAILY AFTER BREAKFAST. 90 tablet 1   fexofenadine (ALLEGRA) 180 MG tablet Take 180 mg by mouth daily as needed for allergies.     fluticasone  (FLONASE ) 50 MCG/ACT nasal spray Place 1 spray into both nostrils daily as  needed for allergies.     ibuprofen  (ADVIL ) 600 MG tablet Take 1 tablet (600 mg total) by mouth every 6 (six) hours as needed. 30 tablet 0   Omega-3 1000 MG CAPS Take 1,000 mg by mouth daily.     SUMAtriptan  (IMITREX ) 100 MG tablet Take 100 mg by mouth every 2 (two) hours as needed for migraine.     No current facility-administered medications for this visit.    HISTORY OF PRESENT ILLNESS:   Oncology History Overview Note  Cancer Staging Malignant neoplasm of upper-inner quadrant of left breast in female, estrogen receptor positive (HCC) Staging form: Breast, AJCC 8th Edition - Clinical stage from 03/22/2019: Stage IA (cT1c, cN0, cM0, G3, ER+, PR+, HER2+) - Signed by Sonja Stanwood, MD on 03/22/2019 Stage prefix: Initial diagnosis Histologic grading system: 3 grade system - Pathologic stage from 04/04/2019: Stage IA (pT1c, pN0, cM0, G3, ER+, PR+, HER2+) - Signed by Sonja Goshen, MD on 04/21/2019 Stage prefix: Initial diagnosis Multigene prognostic tests performed: None Histologic grading system: 3 grade system Residual tumor (R): R0 - None - Pathologic: No stage assigned - Unsigned    Malignant neoplasm of upper-inner quadrant of left breast in female, estrogen receptor positive (HCC)  03/10/2019 Mammogram   Diagnostic Mammogram 03/10/19  IMPRESSION: 1. Suspicious mass in the 10 o'clock position of the left breast, 3cm from the nipple measuring 12x7x17mm. Biopsy is indicated. 2. Indeterminate mass in the 5:30 o'clock position of the left breast, 5cm from the nipple, measuring 9x6x32mm. Although this may reflect a fibroadenoma, tissue sampling is recommended.   03/16/2019 Initial Biopsy   Diagnosis 03/16/19  1. 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 SEE NOTE 2. Breast, left, needle core biopsy, 5:30 o'clock - FIBROADENOMA   03/16/2019 Receptors her2   Results: IMMUNOHISTOCHEMICAL AND MORPHOMETRIC ANALYSIS PERFORMED MANUALLY The tumor cells are POSITIVE  for Her2 (3+). Estrogen Receptor: 100%, POSITIVE, STRONG STAINING INTENSITY Progesterone Receptor: 100%, POSITIVE, STRONG STAINING INTENSITY Proliferation Marker Ki67: 60%   03/21/2019 Initial Diagnosis   Malignant neoplasm of upper-inner quadrant of left breast in female, estrogen receptor positive (HCC)   03/22/2019 Cancer Staging   Staging form: Breast, AJCC 8th Edition - Clinical stage from 03/22/2019: Stage IA (cT1c, cN0, cM0, G3, ER+, PR+, HER2+) - Signed by Sonja Fisher, MD on 03/22/2019   03/31/2019 Genetic Testing   VUS in CTNNA1 called c.[id number]>T (Intronic) was identified on the [facility] Common Hereditary Cancers Panel. The Common Hereditary Cancers Panel offered by [facility] includes sequencing and/or deletion duplication testing of the following 47 genes: APC, ATM, AXIN2, BARD1, BMPR1A, BRCA1, BRCA2, BRIP1, CDH1, CDKN2A (p14ARF), CDKN2A (p16INK4a), CKD4, CHEK2, CTNNA1, DICER1, EPCAM (Deletion/duplication testing only), GREM1 (promoter region deletion/duplication testing only), KIT, MEN1, MLH1, MSH2, MSH3, MSH6, MUTYH, NBN, NF1, NHTL1, PALB2, PDGFRA, PMS2, POLD1, POLE, PTEN, RAD50, RAD51C, RAD51D, SDHB, SDHC, SDHD, SMAD4, SMARCA4. STK11, TP53, TSC1, TSC2, and VHL.  The following genes were evaluated for sequence changes only: SDHA and HOXB13 c.251G>A variant only. The report date is 03/30/2019.    03/31/2019 Breast MRI   MRI breast 03/31/19  IMPRESSION:  1. 5 millimeter enhancing oval mass in the anteromedial aspect of the RIGHT breast warranting further evaluation. Given its anterior depth and visibility on noncontrast images, ultrasound is recommended to determine if there is a sonographic correlate. If no sonographic correlate is identified to guide biopsy, MRI biopsy of the RIGHT breast is recommended. 2. Known malignancy in the UPPER INNER QUADRANT of the LEFT breast measuring 1.0 centimeters. 3. 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   04/04/2019 Surgery    RADIOCATIVE SEED GUIDED LEFT BREAST LUMPECTOMY, LEFT AXILLARY SENTINEL LYMPH NODE BIOPSY and PAC placement  By Dr. Afton Horse  04/04/19    04/04/2019 Pathology Results   DIAGNOSIS: 04/04/19  A. BREAST, LEFT, LUMPECTOMY:  -  Invasive ductal carcinoma, Nottingham grade 3 of 3, 1.3 cm  -  Ductal carcinoma in-situ, intermediate grade  -  Margins uninvolved by carcinoma (0.1 cm; posterior margin; see  comment)  -  Previous biopsy site changes present  -  See oncology table and comment below   B. BREAST, LEFT, ADDITIONAL MEDIAL MARGIN, EXCISION:  -  Fibrocystic changes  -  No residual carcinoma identified   C. LYMPH NODE, LEFT AXILLARY, SENTINEL, BIOPSY:  -   No carcinoma identified in one lymph node (0/1)    04/04/2019 Cancer Staging   Staging form: Breast, AJCC 8th Edition - Pathologic stage from 04/04/2019: Stage IA (pT1c, pN0, cM0, G3, ER+, PR+, HER2+) - Signed by Sonja [place], MD on 04/21/2019   04/11/2019 Pathology Results   Diagnosis Breast, right, needle core biopsy, 2 o'clock - SMALL INTRADUCTAL PAPILLOMA. SEE NOTE - SMALL FIBROADENOMA - NEGATIVE FOR CARCINOMA   05/12/2019 - 06/10/2020 Chemotherapy   Adjuvant Weekly Taxol  and Herceptin  for 12 weeks starting 05/12/19, switched to Abraxane  on 08/04/19 due to skin rash. Completed on 08/11/19.  Followed by maintenance Herceptin  q3weeks to complete 1 year treatment (from 05/12/19) starting 09/01/19-06/10/20   08/29/2019 - 09/26/2019 Radiation Therapy   Adjuvant radiation per Dr. Eloise Hake    10/12/2019 -  Anti-estrogen oral therapy   Tamoxifen  20mg  daily starting 10/12/19   11/27/2019 Survivorship   SCP delivered by Lacie Burton, NP    03/15/2020 Mammogram   IMPRESSION: 1. Expected surgical changes at the lumpectomy site in the central posterior left breast.   2. No suspicious changes at the site of the biopsied papilloma in the lower inner retroareolar right breast (ribbon clip). Surgical excision is planned for this lesion.   3.  No  evidence of malignancy in the bilateral breasts.   04/17/2020 Surgery   RIGHT BREAST LUMPECTOMY WITH RADIOACTIVE SEED LOCALIZATION and PAC removal by Dr Afton Horse   FINAL MICROSCOPIC DIAGNOSIS:   A. BREAST, RIGHT, LUMPECTOMY:  - Biopsy clip.  Fibroadenoma.  Fibroadenomatoid change.  Fibrocystic  change.     08/2020 Surgery   Bilateral mastectomy with reconstruction   01/2021 Surgery   Bilateral salpingo-oophorectomy        REVIEW OF SYSTEMS:   Constitutional: Denies fevers, chills or abnormal weight loss Eyes: Denies blurriness of vision Ears, nose, mouth, throat, and face: Denies mucositis or sore throat Respiratory: Denies cough, dyspnea or wheezes Cardiovascular: Denies palpitation, chest discomfort or lower extremity swelling Gastrointestinal:  Denies nausea, heartburn or change in bowel habits Skin: Denies abnormal skin rashes Lymphatics: Denies new lymphadenopathy or easy bruising Neurological:Denies numbness, tingling or new weaknesses Behavioral/Psych: Mood is stable, no new changes  All other systems were reviewed with the patient and are negative.   VITALS:  There were no vitals taken for this visit.  Wt Readings from Last 3 Encounters:  05/24/23 142 lb (64.4 kg)  11/16/22 142 lb 14.4 oz (64.8 kg)  10/26/22 143 lb 12.8 oz (65.2 kg)    There is no height or weight on file to calculate BMI.  Performance status (ECOG): {CHL ONC [id number]  PHYSICAL EXAM:   GENERAL:alert, no distress and comfortable SKIN: skin color, texture, turgor are normal, no rashes or significant lesions EYES: normal, Conjunctiva are pink and non-injected, sclera clear OROPHARYNX:no exudate, no erythema and lips, buccal mucosa, and tongue normal  NECK: supple, thyroid  normal size, non-tender, without nodularity LYMPH:  no palpable lymphadenopathy in the cervical, axillary or inguinal LUNGS: clear to auscultation and percussion with normal breathing effort HEART: regular rate &  rhythm and no murmurs and no lower extremity edema ABDOMEN:abdomen soft, non-tender and normal bowel sounds Musculoskeletal:no cyanosis of digits and no clubbing  NEURO: alert & oriented x 3 with fluent speech, no focal motor/sensory deficits  LABORATORY DATA:  I have reviewed the data as listed    Component Value Date/Time   NA 139 05/24/2023 0932   K 3.9 05/24/2023 0932   CL 102 05/24/2023 0932   CO2 31 05/24/2023 0932   GLUCOSE 99 05/24/2023 0932   BUN 13 05/24/2023 0932   CREATININE 0.66 05/24/2023 0932   CALCIUM 9.4 05/24/2023 0932   PROT 7.4 05/24/2023 0932   ALBUMIN 4.5 05/24/2023 0932   AST 23 05/24/2023 0932   ALT 6 05/24/2023 0932   ALKPHOS 80 05/24/2023 0932   BILITOT 0.4 05/24/2023 0932   GFRNONAA >60 05/24/2023 0932   GFRAA >60 03/19/2020 1438    No results found for: "SPEP", "UPEP"  Lab Results  Component Value Date   WBC 7.4 05/24/2023   NEUTROABS 3.8 05/24/2023   HGB 13.9 05/24/2023   HCT 39.5 05/24/2023   MCV 89.4 05/24/2023   PLT 379 05/24/2023      Chemistry      Component Value Date/Time   NA 139 05/24/2023 0932   K 3.9 05/24/2023 0932   CL 102 05/24/2023 0932   CO2 31 05/24/2023 0932   BUN 13 05/24/2023 0932   CREATININE 0.66 05/24/2023 0932      Component Value Date/Time   CALCIUM 9.4 05/24/2023 0932   ALKPHOS 80 05/24/2023 0932   AST 23 05/24/2023 0932   ALT 6 05/24/2023 0932   BILITOT 0.4 05/24/2023 0932       RADIOGRAPHIC STUDIES: I have personally reviewed the radiological images as listed and agreed with the findings in the report. No results found.

## 2023-11-22 NOTE — Telephone Encounter (Signed)
 Called patient due to not showing up for her 0930 lab app. Was not able to speak to anyone so I left a VM.

## 2023-11-22 NOTE — Telephone Encounter (Signed)
 Rescheduled appointments per the patient being sick. The patient is aware of the changes made.

## 2023-12-02 NOTE — Progress Notes (Signed)
 Patient Care Team: Jearldine Mina, MD as PCP - General (Internal Medicine) Alane Hsu, RN as Oncology Nurse Navigator Auther Bo, RN as Oncology Nurse Navigator Sim Dryer, MD as Consulting Physician (General Surgery) Sonja Sublette, MD as Consulting Physician (Hematology) Retta Caster, MD as Consulting Physician (Radiation Oncology) Burton, Lacie K, NP as Nurse Practitioner (Nurse Practitioner)  Clinic Day:  12/03/2023  Referring physician: Jearldine Mina, MD  ASSESSMENT & PLAN:   Assessment & Plan: Malignant neoplasm of upper-inner quadrant of left breast in female, estrogen receptor positive (HCC)  Stage IA, p(T1cN0M0), ER+/PR+, HER2+, Grade III -Diagnosed in 03/2019. S/p left breast lumpectomy and SLNB on 04/04/19. 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 Eloise Hake -She began tamoxifen  in 10/2019 and changed to anastrozole  in 09/2020. Due to worsening joint pain and decreased grip strength. Eventually, she changed to exemestane  in 05/2022 which she is tolerating well with only mild joint pain, mostly in the mornings.  -2022 she did undergo double mastectomy and reconstructive surgery.  -2022 - she underwent bilateral oophorectomy.  -2025 - bone density test showing osteopenia. Start fosamax 70 mg weekly. Repeat in 2027.     Osteopenia  The patient reports having osteopenia prior to her treatment for breast cancer. Did receive zometa  infusions. States that DEXA scan in 2023 showed normal bone density. She had a new bone density test earlier this year. She states that new DEXA scan showing osteopenia again. She remembers being told that she needed to keep her bones strong and wants to be proactive regarding recurrence or breast cancer metastases. She has been taking calcium and vitamin d daily. She is active. Will trial fosamax 70 mg weekly. Expect to  repeat bone density test in 2027.   Plan: Reviewed labs. -CBC and CMP are unremarkable.  She has had bilateral mastectomies with reconstruction. No clinical need for mammogram.  Continue exemestane  daily.  Start fosamax weekly.  Repeat bone density in 2027. Repeat labs and follow up in 6 months, sooner if needed.     The patient understands the plans discussed today and is in agreement with them.  She knows to contact our office if she develops concerns prior to her next appointment.  I provided 25 minutes of face-to-face time during this encounter and > 50% was spent counseling as documented under my assessment and plan.    Sharyon Deis, NP  [facility] - A DEPT OF [facility] [address] [place] [address] Dept: [phone] Dept Fax: [phone]   No orders of the defined types were placed in this encounter.     CHIEF COMPLAINT:  CC: left breast cancer, estrogen receptor positive.   Current Treatment:  exemestane  05/2022 due to negative SEs of anastrozole . Initially started tamoxifen  20 mg daily 10/12/2019.   INTERVAL HISTORY:  Chelsea Hobbs is here today for repeat clinical assessment. She denies fevers or chills. She was last seen by myself on 05/24/2023. She has had bilateral mastectomy with reconstructive surgery. She has also had BSO in 2022. Most recent DEXA scan done earlier this year showing osteopenia. Currently taking calcium and vitamin d every day. She has active lifestyle. She denies new masses or concerns in the chest wall or in axillary regions. She denies chest pain, chest pressure, or shortness of breath. She denies headaches  or visual disturbances. She denies abdominal pain, nausea, vomiting, or changes in bowel or bladder habits. She denies pain. Her appetite is good. Her weight has decreased 5 pounds over last 6 months.  I have reviewed the past medical history, past surgical history,  social history and family history with the patient and they are unchanged from previous note.  ALLERGIES:  is allergic to gadolinium derivatives, latex, tetracycline hcl, and wound dressing adhesive.  MEDICATIONS:  Current Outpatient Medications  Medication Sig Dispense Refill   albuterol  (PROVENTIL  HFA;VENTOLIN  HFA) 108 (90 BASE) MCG/ACT inhaler Inhale 2 puffs into the lungs every 6 (six) hours as needed for shortness of breath.     alendronate (FOSAMAX) 70 MG tablet Take 1 tablet (70 mg total) by mouth once a week. Take with a full glass of water on an empty stomach. 4 tablet 5   ALPRAZolam  (XANAX ) 0.5 MG tablet Take 0.25 mg by mouth 2 (two) times daily as needed for anxiety.     Ascorbic Acid (VITAMIN C) 1000 MG tablet Take 2,000 mg by mouth daily. Lysine     buPROPion (WELLBUTRIN XL) 150 MG 24 hr tablet Take 150 mg by mouth daily.     Calcium-Magnesium-Vitamin D (CALCIUM MAGNESIUM PO) Take 1,200 mg by mouth daily.     carbidopa -levodopa  (SINEMET  IR) 25-100 MG tablet TAKE 1 TABLET BY MOUTH 3 (THREE) TIMES DAILY. 7AM/11AM/4PM 270 tablet 0   Cholecalciferol (VITAMIN D) 125 MCG (5000 UT) CAPS Take 5,000 Units by mouth daily.     docusate sodium  (COLACE) 100 MG capsule Take 1 capsule (100 mg total) by mouth 2 (two) times daily. 60 capsule 2   exemestane  (AROMASIN ) 25 MG tablet TAKE 1 TABLET (25 MG TOTAL) BY MOUTH DAILY AFTER BREAKFAST. 90 tablet 1   fexofenadine (ALLEGRA) 180 MG tablet Take 180 mg by mouth daily as needed for allergies.     fluticasone  (FLONASE ) 50 MCG/ACT nasal spray Place 1 spray into both nostrils daily as needed for allergies.     ibuprofen  (ADVIL ) 600 MG tablet Take 1 tablet (600 mg total) by mouth every 6 (six) hours as needed. 30 tablet 0   Omega-3 1000 MG CAPS Take 1,000 mg by mouth daily.     SUMAtriptan  (IMITREX ) 100 MG tablet Take 100 mg by mouth every 2 (two) hours as needed for migraine.     No current facility-administered medications for this visit.    HISTORY  OF PRESENT ILLNESS:   Oncology History Overview Note  Cancer Staging Malignant neoplasm of upper-inner quadrant of left breast in female, estrogen receptor positive (HCC) Staging form: Breast, AJCC 8th Edition - Clinical stage from 03/22/2019: Stage IA (cT1c, cN0, cM0, G3, ER+, PR+, HER2+) - Signed by Sonja Fernville, MD on 03/22/2019 Stage prefix: Initial diagnosis Histologic grading system: 3 grade system - Pathologic stage from 04/04/2019: Stage IA (pT1c, pN0, cM0, G3, ER+, PR+, HER2+) - Signed by Sonja Matagorda, MD on 04/21/2019 Stage prefix: Initial diagnosis Multigene prognostic tests performed: None Histologic grading system: 3 grade system Residual tumor (R): R0 - None - Pathologic: No stage assigned - Unsigned    Malignant neoplasm of upper-inner quadrant of left breast in female, estrogen receptor positive (HCC)  03/10/2019 Mammogram   Diagnostic Mammogram 03/10/19  IMPRESSION: 1. Suspicious mass in the 10 o'clock position of the left breast, 3cm from the nipple measuring 12x7x87mm. Biopsy is indicated. 2. Indeterminate mass in the 5:30 o'clock position of the left breast, 5cm from the nipple, measuring 9x6x61mm. Although this may  reflect a fibroadenoma, tissue sampling is recommended.   03/16/2019 Initial Biopsy   Diagnosis 03/16/19  1. 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 SEE NOTE 2. Breast, left, needle core biopsy, 5:30 o'clock - FIBROADENOMA   03/16/2019 Receptors her2   Results: IMMUNOHISTOCHEMICAL AND MORPHOMETRIC ANALYSIS PERFORMED MANUALLY The tumor cells are POSITIVE for Her2 (3+). Estrogen Receptor: 100%, POSITIVE, STRONG STAINING INTENSITY Progesterone Receptor: 100%, POSITIVE, STRONG STAINING INTENSITY Proliferation Marker Ki67: 60%   03/21/2019 Initial Diagnosis   Malignant neoplasm of upper-inner quadrant of left breast in female, estrogen receptor positive (HCC)   03/22/2019 Cancer Staging   Staging form: Breast, AJCC 8th  Edition - Clinical stage from 03/22/2019: Stage IA (cT1c, cN0, cM0, G3, ER+, PR+, HER2+) - Signed by Sonja Ferguson, MD on 03/22/2019   03/31/2019 Genetic Testing   VUS in CTNNA1 called [id number] (Intronic) was identified on the [facility] Common Hereditary Cancers Panel. The Common Hereditary Cancers Panel offered by [facility] includes sequencing and/or deletion duplication testing of the following 47 genes: APC, ATM, AXIN2, BARD1, BMPR1A, BRCA1, BRCA2, BRIP1, CDH1, CDKN2A (p14ARF), CDKN2A (p16INK4a), CKD4, CHEK2, CTNNA1, DICER1, EPCAM (Deletion/duplication testing only), GREM1 (promoter region deletion/duplication testing only), KIT, MEN1, MLH1, MSH2, MSH3, MSH6, MUTYH, NBN, NF1, NHTL1, PALB2, PDGFRA, PMS2, POLD1, POLE, PTEN, RAD50, RAD51C, RAD51D, SDHB, SDHC, SDHD, SMAD4, SMARCA4. STK11, TP53, TSC1, TSC2, and VHL.  The following genes were evaluated for sequence changes only: SDHA and HOXB13 c.251G>A variant only. The report date is 03/30/2019.    03/31/2019 Breast MRI   MRI breast 03/31/19  IMPRESSION: 1. 5 millimeter enhancing oval mass in the anteromedial aspect of the RIGHT breast warranting further evaluation. Given its anterior depth and visibility on noncontrast images, ultrasound is recommended to determine if there is a sonographic correlate. If no sonographic correlate is identified to guide biopsy, MRI biopsy of the RIGHT breast is recommended. 2. Known malignancy in the UPPER INNER QUADRANT of the LEFT breast measuring 1.0 centimeters. 3. 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   04/04/2019 Surgery   RADIOCATIVE SEED GUIDED LEFT BREAST LUMPECTOMY, LEFT AXILLARY SENTINEL LYMPH NODE BIOPSY and PAC placement  By Dr. Afton Horse  04/04/19    04/04/2019 Pathology Results   DIAGNOSIS: 04/04/19  A. BREAST, LEFT, LUMPECTOMY:  -  Invasive ductal carcinoma, Nottingham grade 3 of 3, 1.3 cm  -  Ductal carcinoma in-situ, intermediate grade  -  Margins uninvolved by  carcinoma (0.1 cm; posterior margin; see  comment)  -  Previous biopsy site changes present  -  See oncology table and comment below   B. BREAST, LEFT, ADDITIONAL MEDIAL MARGIN, EXCISION:  -  Fibrocystic changes  -  No residual carcinoma identified   C. LYMPH NODE, LEFT AXILLARY, SENTINEL, BIOPSY:  -   No carcinoma identified in one lymph node (0/1)    04/04/2019 Cancer Staging   Staging form: Breast, AJCC 8th Edition - Pathologic stage from 04/04/2019: Stage IA (pT1c, pN0, cM0, G3, ER+, PR+, HER2+) - Signed by Sonja Toughkenamon, MD on 04/21/2019   04/11/2019 Pathology Results   Diagnosis Breast, right, needle core biopsy, 2 o'clock - SMALL INTRADUCTAL PAPILLOMA. SEE NOTE - SMALL FIBROADENOMA - NEGATIVE FOR CARCINOMA   05/12/2019 - 06/10/2020 Chemotherapy   Adjuvant Weekly Taxol  and Herceptin  for 12 weeks starting 05/12/19, switched to Abraxane  on 08/04/19 due to skin rash. Completed on 08/11/19.  Followed by maintenance Herceptin  q3weeks to complete 1 year treatment (from 05/12/19) starting 09/01/19-06/10/20  08/29/2019 - 09/26/2019 Radiation Therapy   Adjuvant radiation per Dr. Eloise Hake    10/12/2019 -  Anti-estrogen oral therapy   Tamoxifen  20mg  daily starting 10/12/19   11/27/2019 Survivorship   SCP delivered by Lacie Burton, NP    03/15/2020 Mammogram   IMPRESSION: 1. Expected surgical changes at the lumpectomy site in the central posterior left breast.   2. No suspicious changes at the site of the biopsied papilloma in the lower inner retroareolar right breast (ribbon clip). Surgical excision is planned for this lesion.   3.  No evidence of malignancy in the bilateral breasts.   04/17/2020 Surgery   RIGHT BREAST LUMPECTOMY WITH RADIOACTIVE SEED LOCALIZATION and PAC removal by Dr Afton Horse   FINAL MICROSCOPIC DIAGNOSIS:   A. BREAST, RIGHT, LUMPECTOMY:  - Biopsy clip.  Fibroadenoma.  Fibroadenomatoid change.  Fibrocystic  change.     08/2020 Surgery   Bilateral mastectomy with  reconstruction   01/2021 Surgery   Bilateral salpingo-oophorectomy        REVIEW OF SYSTEMS:   Constitutional: Denies fevers, chills or abnormal weight loss Eyes: Denies blurriness of vision Ears, nose, mouth, throat, and face: Denies mucositis or sore throat Respiratory: Denies cough, dyspnea or wheezes Cardiovascular: Denies palpitation, chest discomfort or lower extremity swelling Gastrointestinal:  Denies nausea, heartburn or change in bowel habits Skin: Denies abnormal skin rashes Lymphatics: Denies new lymphadenopathy or easy bruising Neurological:Denies numbness, tingling or new weaknesses Behavioral/Psych: Mood is stable, no new changes  All other systems were reviewed with the patient and are negative.   VITALS:   Today's Vitals   12/03/23 1348 12/03/23 1349  BP: 136/68   Pulse: 90   Resp: 18   Temp: 97.9 F (36.6 C)   TempSrc: Temporal   SpO2: 96%   Weight: 137 lb 1.6 oz (62.2 kg)   Height: 5\' 2"  (1.575 m)   PainSc:  0-No pain   Body mass index is 25.08 kg/m.    Wt Readings from Last 3 Encounters:  12/03/23 137 lb 1.6 oz (62.2 kg)  05/24/23 142 lb (64.4 kg)  11/16/22 142 lb 14.4 oz (64.8 kg)    Body mass index is 25.08 kg/m.  Performance status (ECOG): 1 - Symptomatic but completely ambulatory  PHYSICAL EXAM:   GENERAL:alert, no distress and comfortable SKIN: skin color, texture, turgor are normal, no rashes or significant lesions EYES: normal, Conjunctiva are pink and non-injected, sclera clear OROPHARYNX:no exudate, no erythema and lips, buccal mucosa, and tongue normal  NECK: supple, thyroid  normal size, non-tender, without nodularity LYMPH:  no palpable lymphadenopathy in the cervical, axillary or inguinal LUNGS: clear to auscultation and percussion with normal breathing effort HEART: regular rate & rhythm and no murmurs and no lower extremity edema ABDOMEN:abdomen soft, non-tender and normal bowel sounds Musculoskeletal:no cyanosis of digits  and no clubbing  NEURO: alert & oriented x 3 with fluent speech, no focal motor/sensory deficits BREAST : she has had bilateral mastectomies with reconstruction. Both nipples are surgically absent. There are no palpable masses or abnormalities along the chest wall. There is no axillary lymphadenopathy bilaterally.   LABORATORY DATA:  I have reviewed the data as listed    Component Value Date/Time   NA 140 12/03/2023 1331   K 4.5 12/03/2023 1331   CL 103 12/03/2023 1331   CO2 31 12/03/2023 1331   GLUCOSE 94 12/03/2023 1331   BUN 14 12/03/2023 1331   CREATININE 0.74 12/03/2023 1331   CALCIUM 9.9 12/03/2023 1331   PROT 7.8 12/03/2023  1331   ALBUMIN 4.8 12/03/2023 1331   AST 30 12/03/2023 1331   ALT 10 12/03/2023 1331   ALKPHOS 80 12/03/2023 1331   BILITOT 0.5 12/03/2023 1331   GFRNONAA >60 12/03/2023 1331   GFRAA >60 03/19/2020 1438     Lab Results  Component Value Date   WBC 9.7 12/03/2023   NEUTROABS 6.6 12/03/2023   HGB 13.9 12/03/2023   HCT 39.8 12/03/2023   MCV 87.7 12/03/2023   PLT 429 (H) 12/03/2023

## 2023-12-02 NOTE — Assessment & Plan Note (Addendum)
 Stage IA, p(T1cN0M0), ER+/PR+, HER2+, Grade III -Diagnosed in 03/2019. S/p left breast lumpectomy and SLNB on 04/04/19. 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 Eloise Hake -She began tamoxifen  in 10/2019 and changed to anastrozole  in 09/2020. Due to worsening joint pain and decreased grip strength. Eventually, she changed to exemestane  in 05/2022 which she is tolerating well with only mild joint pain, mostly in the mornings.  -2022 she did undergo double mastectomy and reconstructive surgery.  -2022 - she underwent bilateral oophorectomy.  -2025 - bone density test showing osteopenia. Start fosamax  70 mg weekly. Repeat in 2027.

## 2023-12-03 ENCOUNTER — Inpatient Hospital Stay: Attending: Nurse Practitioner

## 2023-12-03 ENCOUNTER — Inpatient Hospital Stay (HOSPITAL_BASED_OUTPATIENT_CLINIC_OR_DEPARTMENT_OTHER): Admitting: Nurse Practitioner

## 2023-12-03 ENCOUNTER — Encounter: Payer: Self-pay | Admitting: Nurse Practitioner

## 2023-12-03 VITALS — BP 136/68 | HR 90 | Temp 97.9°F | Resp 18 | Ht 62.0 in | Wt 137.1 lb

## 2023-12-03 DIAGNOSIS — Z79899 Other long term (current) drug therapy: Secondary | ICD-10-CM | POA: Insufficient documentation

## 2023-12-03 DIAGNOSIS — M8580 Other specified disorders of bone density and structure, unspecified site: Secondary | ICD-10-CM | POA: Insufficient documentation

## 2023-12-03 DIAGNOSIS — C50212 Malignant neoplasm of upper-inner quadrant of left female breast: Secondary | ICD-10-CM

## 2023-12-03 DIAGNOSIS — Z79811 Long term (current) use of aromatase inhibitors: Secondary | ICD-10-CM | POA: Diagnosis not present

## 2023-12-03 DIAGNOSIS — Z170 Estrogen receptor positive status [ER+]: Secondary | ICD-10-CM | POA: Insufficient documentation

## 2023-12-03 LAB — CMP (CANCER CENTER ONLY)
ALT: 10 U/L (ref 0–44)
AST: 30 U/L (ref 15–41)
Albumin: 4.8 g/dL (ref 3.5–5.0)
Alkaline Phosphatase: 80 U/L (ref 38–126)
Anion gap: 6 (ref 5–15)
BUN: 14 mg/dL (ref 6–20)
CO2: 31 mmol/L (ref 22–32)
Calcium: 9.9 mg/dL (ref 8.9–10.3)
Chloride: 103 mmol/L (ref 98–111)
Creatinine: 0.74 mg/dL (ref 0.44–1.00)
GFR, Estimated: >60 mL/min (ref >60)
Glucose, Bld: 94 mg/dL (ref 70–99)
Potassium: 4.5 mmol/L (ref 3.5–5.1)
Sodium: 140 mmol/L (ref 135–145)
Total Bilirubin: 0.5 mg/dL (ref 0.0–1.2)
Total Protein: 7.8 g/dL (ref 6.5–8.1)

## 2023-12-03 LAB — CBC WITH DIFFERENTIAL (CANCER CENTER ONLY)
Abs Immature Granulocytes: 0.03 10*3/uL (ref 0.00–0.07)
Basophils Absolute: 0.1 10*3/uL (ref 0.0–0.1)
Basophils Relative: 1 %
Eosinophils Absolute: 0.1 10*3/uL (ref 0.0–0.5)
Eosinophils Relative: 1 %
HCT: 39.8 % (ref 36.0–46.0)
Hemoglobin: 13.9 g/dL (ref 12.0–15.0)
Immature Granulocytes: 0 %
Lymphocytes Relative: 24 %
Lymphs Abs: 2.3 10*3/uL (ref 0.7–4.0)
MCH: 30.6 pg (ref 26.0–34.0)
MCHC: 34.9 g/dL (ref 30.0–36.0)
MCV: 87.7 fL (ref 80.0–100.0)
Monocytes Absolute: 0.6 10*3/uL (ref 0.1–1.0)
Monocytes Relative: 6 %
Neutro Abs: 6.6 10*3/uL (ref 1.7–7.7)
Neutrophils Relative %: 68 %
Platelet Count: 429 10*3/uL — ABNORMAL HIGH (ref 150–400)
RBC: 4.54 MIL/uL (ref 3.87–5.11)
RDW: 12.6 % (ref 11.5–15.5)
WBC Count: 9.7 10*3/uL (ref 4.0–10.5)
nRBC: 0 % (ref 0.0–0.2)

## 2023-12-03 MED ORDER — ALENDRONATE SODIUM 70 MG PO TABS: 70.0000 mg | ORAL_TABLET | ORAL | 5 refills | Status: AC

## 2023-12-07 ENCOUNTER — Telehealth: Payer: Self-pay

## 2023-12-07 ENCOUNTER — Other Ambulatory Visit: Payer: Self-pay

## 2023-12-07 NOTE — Telephone Encounter (Signed)
 Faxed request for bone density report/ last dexa scan to [facility], [phone]. Confirmation Received. Reached out to the facility to confirm request was received. Spoke with Astronomer at [facility]. Cleveland Dales stated she would fax the report today to [phone].. Awaiting report.

## 2024-01-03 DIAGNOSIS — Z853 Personal history of malignant neoplasm of breast: Secondary | ICD-10-CM | POA: Diagnosis not present

## 2024-01-03 DIAGNOSIS — Z9013 Acquired absence of bilateral breasts and nipples: Secondary | ICD-10-CM | POA: Diagnosis not present

## 2024-01-03 DIAGNOSIS — Z923 Personal history of irradiation: Secondary | ICD-10-CM | POA: Diagnosis not present

## 2024-01-17 ENCOUNTER — Other Ambulatory Visit: Payer: Self-pay

## 2024-01-17 ENCOUNTER — Other Ambulatory Visit: Payer: Self-pay | Admitting: Nurse Practitioner

## 2024-01-17 ENCOUNTER — Other Ambulatory Visit: Payer: Self-pay | Admitting: Neurology

## 2024-01-17 ENCOUNTER — Encounter: Payer: Self-pay | Admitting: Nurse Practitioner

## 2024-01-17 DIAGNOSIS — G20A1 Parkinson's disease without dyskinesia, without mention of fluctuations: Secondary | ICD-10-CM

## 2024-02-17 NOTE — Progress Notes (Signed)
 Assessment/Plan:   1.  Parkinsons Disease, diagnosed January 2024  -She will continue carbidopa /levodopa  25/100, 1 tablet 3 times per day.  She can take an extra if needed.  -discussed importance of exercise  -We discussed that it used to be thought that levodopa  would increase risk of melanoma but now it is believed that Parkinsons itself likely increases risk of melanoma. she is to get regular skin checks.  She is doing that.  She sees [facility]  -invited to symposium  2.  GAD  - On Wellbutrin  -referral to counseling  3.  History of breast cancer, 2020  - Status post bilateral mastectomies with reconstruction.  Follows with oncology.  Doing well.  Subjective:   Chelsea Hobbs was seen today in follow up for Parkinsons disease.  My previous records were reviewed prior to todays visit as well as outside records available to me.  From a Parkinson's standpoint, the patient is doing well.  She works tues-thurs and by [employer] she gets tired and notes more tremor.  She asks if she can extra carbidopa /levodopa  on thurs if needed, mostly internal.  She has had no falls.  No lightheadedness or near syncope.  Last visit, her primary care had just started her on Wellbutrin and she reports today that  she is doing well on that.  She is exercising.   She has been following with oncology regarding her history of breast cancer, and everything has been good in that regard.  Current prescribed movement disorder medications: Carbidopa /levodopa  25/100, 1 tablet 3 times per day   PREVIOUS MEDICATIONS: Sinemet   ALLERGIES:   Allergies  Allergen Reactions   Gadolinium Derivatives Hives and Other (See Comments)    Pt started wheezing after contrast/ runny nose/ 1 hive on arm/ Dr Minus checked pt condition/ pt has an inhaler so used her inhaler and felt better/ no medication given / pt was instructed to wait at [facility] for 30 min to be reassessed by Dr.//jv    Latex Dermatitis    Tetracycline Hcl Diarrhea   Wound Dressing Adhesive Rash    Dermabond    CURRENT MEDICATIONS:  Current Meds  Medication Sig   albuterol  (PROVENTIL  HFA;VENTOLIN  HFA) 108 (90 BASE) MCG/ACT inhaler Inhale 2 puffs into the lungs every 6 (six) hours as needed for shortness of breath.   alendronate  (FOSAMAX ) 70 MG tablet Take 1 tablet (70 mg total) by mouth once a week. Take with a full glass of water on an empty stomach.   ALPRAZolam  (XANAX ) 0.5 MG tablet Take 0.25 mg by mouth 2 (two) times daily as needed for anxiety.   Ascorbic Acid (VITAMIN C) 1000 MG tablet Take 2,000 mg by mouth daily. Lysine   buPROPion (WELLBUTRIN XL) 150 MG 24 hr tablet Take 150 mg by mouth daily.   Calcium-Magnesium-Vitamin D (CALCIUM MAGNESIUM PO) Take 1,200 mg by mouth daily.   carbidopa -levodopa  (SINEMET  IR) 25-100 MG tablet TAKE 1 TABLET BY MOUTH 3 (THREE) TIMES DAILY. 7AM/11AM/4PM   Cholecalciferol (VITAMIN D) 125 MCG (5000 UT) CAPS Take 5,000 Units by mouth daily.   docusate sodium  (COLACE) 100 MG capsule Take 1 capsule (100 mg total) by mouth 2 (two) times daily.   exemestane  (AROMASIN ) 25 MG tablet TAKE 1 TABLET (25 MG TOTAL) BY MOUTH DAILY AFTER BREAKFAST.   fexofenadine (ALLEGRA) 180 MG tablet Take 180 mg by mouth daily as needed for allergies.   fluticasone  (FLONASE ) 50 MCG/ACT nasal spray Place 1 spray into both nostrils daily as  needed for allergies.   ibuprofen  (ADVIL ) 600 MG tablet Take 1 tablet (600 mg total) by mouth every 6 (six) hours as needed.   Omega-3 1000 MG CAPS Take 1,000 mg by mouth daily.   SUMAtriptan  (IMITREX ) 100 MG tablet Take 100 mg by mouth every 2 (two) hours as needed for migraine.     Objective:   PHYSICAL EXAMINATION:    VITALS:   Vitals:   02/21/24 0829  BP: 126/84  Pulse: 68  SpO2: 98%     GEN:  The patient appears stated age and is in NAD. HEENT:  Normocephalic, atraumatic.  The mucous membranes are moist. The superficial temporal arteries are without ropiness or  tenderness. CV:  RRR Lungs:  CTAB Neck/HEME:  There are no carotid bruits bilaterally.  Neurological examination:  Orientation: The patient is alert and oriented x3. Cranial nerves: There is good facial symmetry without facial hypomimia. The speech is fluent and clear. Soft palate rises symmetrically and there is no tongue deviation. Hearing is intact to conversational tone. Sensation: Sensation is intact to light touch throughout Motor: Strength is at least antigravity x4.  Movement examination: Tone: There is normal tone in the UE/LE  Abnormal movements: normal Coordination:  There is no decremation with RAM's, with any form of RAMS, including alternating supination and pronation of the forearm, hand opening and closing, finger taps.  Slight decreased toe taps on the R Gait and Station: The patient has no difficulty arising out of a deep-seated chair without the use of the hands. The patient's stride length is normal.    I have reviewed and interpreted the following labs independently    Chemistry      Component Value Date/Time   NA 140 12/03/2023 1331   K 4.5 12/03/2023 1331   CL 103 12/03/2023 1331   CO2 31 12/03/2023 1331   BUN 14 12/03/2023 1331   CREATININE 0.74 12/03/2023 1331      Component Value Date/Time   CALCIUM 9.9 12/03/2023 1331   ALKPHOS 80 12/03/2023 1331   AST 30 12/03/2023 1331   ALT 10 12/03/2023 1331   BILITOT 0.5 12/03/2023 1331       Lab Results  Component Value Date   WBC 9.7 12/03/2023   HGB 13.9 12/03/2023   HCT 39.8 12/03/2023   MCV 87.7 12/03/2023   PLT 429 (H) 12/03/2023    Lab Results  Component Value Date   TSH 0.790 06/10/2020   Total time spent on today's visit was 30 minutes, including both face-to-face time and nonface-to-face time.  Time included that spent on review of records (prior notes available to me/labs/imaging if pertinent), discussing treatment and goals, answering patient's questions and coordinating care.    Cc:   Husain, Karrar, MD

## 2024-02-21 ENCOUNTER — Ambulatory Visit (INDEPENDENT_AMBULATORY_CARE_PROVIDER_SITE_OTHER): Payer: BC Managed Care – PPO | Admitting: Neurology

## 2024-02-21 ENCOUNTER — Other Ambulatory Visit: Payer: Self-pay

## 2024-02-21 VITALS — BP 126/84 | HR 68 | Ht 62.0 in

## 2024-02-21 DIAGNOSIS — F411 Generalized anxiety disorder: Secondary | ICD-10-CM

## 2024-02-21 DIAGNOSIS — F4323 Adjustment disorder with mixed anxiety and depressed mood: Secondary | ICD-10-CM

## 2024-02-21 DIAGNOSIS — G20A1 Parkinson's disease without dyskinesia, without mention of fluctuations: Secondary | ICD-10-CM

## 2024-02-21 NOTE — Patient Instructions (Signed)
 SAVE THE DATE!  We are planning a Parkinsons Disease educational symposium at [facility], [address] [place], [place], [place] [address] on September 19.  We will have a movement disorder physician expert from [employer] coming to speak and a caregiver speaker.  We will have a panel of experts that will show you who you may need on your team of people on your journey with Parkinsons.  I hope to see you there!  Use this QR code to register by scanning it with the camera app on your phone:      Need more help with registration?  Contact [email or web address]

## 2024-03-17 DIAGNOSIS — D485 Neoplasm of uncertain behavior of skin: Secondary | ICD-10-CM | POA: Diagnosis not present

## 2024-03-17 DIAGNOSIS — L814 Other melanin hyperpigmentation: Secondary | ICD-10-CM | POA: Diagnosis not present

## 2024-03-17 DIAGNOSIS — D225 Melanocytic nevi of trunk: Secondary | ICD-10-CM | POA: Diagnosis not present

## 2024-03-17 DIAGNOSIS — L570 Actinic keratosis: Secondary | ICD-10-CM | POA: Diagnosis not present

## 2024-03-17 DIAGNOSIS — L821 Other seborrheic keratosis: Secondary | ICD-10-CM | POA: Diagnosis not present

## 2024-03-17 DIAGNOSIS — R233 Spontaneous ecchymoses: Secondary | ICD-10-CM | POA: Diagnosis not present

## 2024-03-21 DIAGNOSIS — G20A1 Parkinson's disease without dyskinesia, without mention of fluctuations: Secondary | ICD-10-CM | POA: Diagnosis not present

## 2024-03-21 DIAGNOSIS — J309 Allergic rhinitis, unspecified: Secondary | ICD-10-CM | POA: Diagnosis not present

## 2024-03-21 DIAGNOSIS — F411 Generalized anxiety disorder: Secondary | ICD-10-CM | POA: Diagnosis not present

## 2024-03-21 DIAGNOSIS — C50212 Malignant neoplasm of upper-inner quadrant of left female breast: Secondary | ICD-10-CM | POA: Diagnosis not present

## 2024-05-09 ENCOUNTER — Other Ambulatory Visit: Payer: Self-pay | Admitting: Neurology

## 2024-05-09 DIAGNOSIS — G20A1 Parkinson's disease without dyskinesia, without mention of fluctuations: Secondary | ICD-10-CM

## 2024-05-28 ENCOUNTER — Encounter: Payer: Self-pay | Admitting: Hematology

## 2024-05-28 DIAGNOSIS — D0511 Intraductal carcinoma in situ of right breast: Secondary | ICD-10-CM | POA: Insufficient documentation

## 2024-05-28 NOTE — Assessment & Plan Note (Deleted)
 Stage IA, p(T1cN0M0), ER+/PR+, HER2+, Grade III -Diagnosed in 03/2019. S/p left breast lumpectomy and SLNB on 04/04/19. 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 Roselind Messier -She began tamoxifen in 10/2019 and changed to anastrozole in 09/2020. Due to worsening joint pain and decreased grip strength. Eventually, she changed to exemestane in 05/2022 which she is tolerating well with only mild joint pain, mostly in the mornings.  -2022 she did undergo double mastectomy and reconstructive surgery.  -2022 - she underwent bilateral oophorectomy.

## 2024-05-28 NOTE — Assessment & Plan Note (Deleted)
 Found on screening MRI 10/18/2020, s/p bilateral mastectomies by Dr. Vanderbilt 12/26/2020  -Path showed focal DCIS, low to intermediate grade with calcifications, and lobular neoplasia.  ER/PR both strongly positive.  Right axillary and left breast lymph nodes were negative -S/p BSO 02/11/2021 -S/P reconstruction by Dr. Arelia 04/01/2021 -She has been on anastrozole  since 09/2020, attempted to switch to exemestane  in 04/2021 due to joint pain but only tried for several days and she switched back.   -due to worsening joint pain and decreased grip strength, she tried exemestane  again in 05/2022, tolerating well.

## 2024-05-29 ENCOUNTER — Inpatient Hospital Stay: Admitting: Hematology

## 2024-05-29 ENCOUNTER — Other Ambulatory Visit

## 2024-05-29 ENCOUNTER — Telehealth: Payer: Self-pay | Admitting: Nurse Practitioner

## 2024-05-29 DIAGNOSIS — C50212 Malignant neoplasm of upper-inner quadrant of left female breast: Secondary | ICD-10-CM

## 2024-05-29 DIAGNOSIS — D0511 Intraductal carcinoma in situ of right breast: Secondary | ICD-10-CM

## 2024-05-29 NOTE — Telephone Encounter (Signed)
 Pt called in to rescheduled  her appt.  Pt is ware of time and date.

## 2024-06-01 ENCOUNTER — Other Ambulatory Visit: Payer: Self-pay | Admitting: Nurse Practitioner

## 2024-06-01 DIAGNOSIS — Z170 Estrogen receptor positive status [ER+]: Secondary | ICD-10-CM

## 2024-06-01 NOTE — Progress Notes (Signed)
 Patient Care Team: Ransom Other, MD as PCP - General (Internal Medicine) Tyree Nanetta SAILOR, RN as Oncology Nurse Navigator Vanderbilt Ned, MD as Consulting Physician (General Surgery) Lanny Callander, MD as Consulting Physician (Hematology) Shannon Agent, MD as Consulting Physician (Radiation Oncology) Burton, Lacie K, NP as Nurse Practitioner (Nurse Practitioner)  Clinic Day:  06/02/2024  Referring physician: Ransom Other, MD  ASSESSMENT & PLAN:   Assessment & Plan: Malignant neoplasm of upper-inner quadrant of left breast in female, estrogen receptor positive (HCC)  Stage IA, p(T1cN0M0), ER+/PR+, HER2+, Grade III -Diagnosed in 03/2019. S/p left breast lumpectomy and SLNB on 04/04/19. 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 Shannon -She began tamoxifen  in 10/2019 and changed to anastrozole  in 09/2020. Due to worsening joint pain and decreased grip strength. Eventually, she changed to exemestane  in 05/2022 which she is tolerating well with only mild joint pain, mostly in the mornings.  -2022 she did undergo double mastectomy and reconstructive surgery.  No role for routine breast imaging due to bilateral mastectomy -2022 - she underwent bilateral oophorectomy.  -2025 - bone density test showing osteopenia. Start fosamax  70 mg weekly. Repeat in 2027.  - 06/02/2024 -patient stopped Fosamax  approximately 2 weeks ago.  Was causing her terrible bone and joint pain along with general feeling of malaise.  Reports doing well on Zometa  well taking chemotherapy and immunotherapy for breast cancer.  New treatment plan entered for Zometa .  Plan to start in 2 to 3 weeks and then every 6 months after that. - continue exemestane  daily. -Plan for labs and follow-up in 6 months, sooner if needed.     Osteoporosis DEXA scan performed by GYN provider showed  osteopenia/osteoporosis.  She did trial of Fosamax  weekly.  Tried for several months.  States Fosamax  called terrible bone and joint pain.  In general, she felt malaise.  During treatment for breast cancer, she was fusions of Zometa .  She states she did very well and would like to go back on Zometa  for bone health due to tolerance of oral bisphosphonates.  Will establish new treatment plan for Zometa  infusions every 6 months.  Plan to administer first dose in next 2 to 3 weeks.  Will then get every 6 months with follow-up visit.  Plan to repeat DEXA scan in 2027.  Left breast cancer, ER/PR/HER2 + Patient underwent double mastectomy with reconstruction in 2022.  She also had bilateral oophorectomy. She is currently on exemestane  daily.  Tolerating this well.  No role for routine breast imaging due to mastectomy.  She has noted no changes, palpable lumps or masses along the chest wall or in the axillary region.  She does check routinely for evidence of change.  Plan to follow-up in 6 months, sooner if needed.  Plan Labs reviewed. -CBC and CMP are unremarkable. She has stopped Fosamax  due to negative SE. -Had a treatment plan to start Zometa  infusions in next 2 to 3 weeks.  Will administer every 6 months.  Repeat DEXA scan due in 2027. Continue exemestane  daily. Plan for labs and follow-up in 6 months, sooner if needed.  The patient understands the plans discussed today and is in agreement with them.  She knows to contact our office if she develops concerns prior to her next appointment.  I provided 25 minutes of face-to-face time during this encounter and > 50% was spent counseling as documented  under my assessment and plan.    Powell FORBES Lessen, NP  [facility] - A DEPT OF [facility] [address] [place] [address] Dept: [phone] Dept Fax: [phone]   No orders of the defined types were placed in this encounter.      CHIEF COMPLAINT:  CC: Left breast cancer, ER +  Current Treatment: Exemestane  (started 05/2022; initially starting antiestrogen 10/12/2019)  INTERVAL HISTORY:  Chelsea Hobbs is here today for repeat clinical assessment.  She last saw me on 12/03/2023.  She reports feeling well in general.  Did trial of Fosamax  since most recent visit. She states this caused severe bone and joint pain.  She also felt generalized malaise.  States she tolerated IV Zometa  much better during her treatment for breast cancer.  Would like to see if this is a possibility for treatment.  Otherwise, she is feeling well.  She has noted no new changes, masses, or lumps around the chest wall or in bilateral axillary regions.  She denies chest pain, chest pressure, or shortness of breath. She denies headaches or visual disturbances. She denies abdominal pain, nausea, vomiting, or changes in bowel or bladder habits.   She denies fevers or chills. She denies pain. Her appetite is good. Her weight has decreased 3 pounds over last 6 months.  She and her husband are currently consuming a Mediterranean diet.     I have reviewed the past medical history, past surgical history, social history and family history with the patient and they are unchanged from previous note.  ALLERGIES:  is allergic to gadolinium derivatives, latex, tetracycline hcl, and wound dressing adhesive.  MEDICATIONS:  Current Outpatient Medications  Medication Sig Dispense Refill   albuterol  (PROVENTIL  HFA;VENTOLIN  HFA) 108 (90 BASE) MCG/ACT inhaler Inhale 2 puffs into the lungs every 6 (six) hours as needed for shortness of breath.     alendronate  (FOSAMAX ) 70 MG tablet Take 1 tablet (70 mg total) by mouth once a week. Take with a full glass of water on an empty stomach. 4 tablet 5   ALPRAZolam  (XANAX ) 0.5 MG tablet Take 0.25 mg by mouth 2 (two) times daily as needed for anxiety.     Ascorbic Acid (VITAMIN C) 1000 MG tablet Take 2,000 mg by mouth daily. Lysine      buPROPion (WELLBUTRIN XL) 150 MG 24 hr tablet Take 150 mg by mouth daily.     Calcium-Magnesium-Vitamin D (CALCIUM MAGNESIUM PO) Take 1,200 mg by mouth daily.     carbidopa -levodopa  (SINEMET  IR) 25-100 MG tablet TAKE 1 TABLET BY MOUTH 3 (THREE) TIMES DAILY. 7AM/11AM/4PM 270 tablet 0   Cholecalciferol (VITAMIN D) 125 MCG (5000 UT) CAPS Take 5,000 Units by mouth daily.     docusate sodium  (COLACE) 100 MG capsule Take 1 capsule (100 mg total) by mouth 2 (two) times daily. 60 capsule 2   exemestane  (AROMASIN ) 25 MG tablet TAKE 1 TABLET (25 MG TOTAL) BY MOUTH DAILY AFTER BREAKFAST. 90 tablet 1   fexofenadine (ALLEGRA) 180 MG tablet Take 180 mg by mouth daily as needed for allergies.     fluticasone  (FLONASE ) 50 MCG/ACT nasal spray Place 1 spray into both nostrils daily as needed for allergies.     ibuprofen  (ADVIL ) 600 MG tablet Take 1 tablet (600 mg total) by mouth every 6 (six) hours as needed. 30 tablet 0   Omega-3 1000 MG CAPS Take 1,000 mg by mouth daily.  SUMAtriptan  (IMITREX ) 100 MG tablet Take 100 mg by mouth every 2 (two) hours as needed for migraine.     No current facility-administered medications for this visit.    HISTORY OF PRESENT ILLNESS:   Oncology History Overview Note  Cancer Staging Malignant neoplasm of upper-inner quadrant of left breast in female, estrogen receptor positive (HCC) Staging form: Breast, AJCC 8th Edition - Clinical stage from 03/22/2019: Stage IA (cT1c, cN0, cM0, G3, ER+, PR+, HER2+) - Signed by Lanny Callander, MD on 03/22/2019 Stage prefix: Initial diagnosis Histologic grading system: 3 grade system - Pathologic stage from 04/04/2019: Stage IA (pT1c, pN0, cM0, G3, ER+, PR+, HER2+) - Signed by Lanny Callander, MD on 04/21/2019 Stage prefix: Initial diagnosis Multigene prognostic tests performed: None Histologic grading system: 3 grade system Residual tumor (R): R0 - None - Pathologic: No stage assigned - Unsigned    Malignant neoplasm of upper-inner quadrant of left  breast in female, estrogen receptor positive (HCC)  03/10/2019 Mammogram   Diagnostic Mammogram 03/10/19  IMPRESSION: 1. Suspicious mass in the 10 o'clock position of the left breast, 3cm from the nipple measuring 12x7x40mm. Biopsy is indicated. 2. Indeterminate mass in the 5:30 o'clock position of the left breast, 5cm from the nipple, measuring 9x6x9mm. Although this may reflect a fibroadenoma, tissue sampling is recommended.   03/16/2019 Initial Biopsy   Diagnosis 03/16/19  1. 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 SEE NOTE 2. Breast, left, needle core biopsy, 5:30 o'clock - FIBROADENOMA   03/16/2019 Receptors her2   Results: IMMUNOHISTOCHEMICAL AND MORPHOMETRIC ANALYSIS PERFORMED MANUALLY The tumor cells are POSITIVE for Her2 (3+). Estrogen Receptor: 100%, POSITIVE, STRONG STAINING INTENSITY Progesterone Receptor: 100%, POSITIVE, STRONG STAINING INTENSITY Proliferation Marker Ki67: 60%   03/21/2019 Initial Diagnosis   Malignant neoplasm of upper-inner quadrant of left breast in female, estrogen receptor positive (HCC)   03/22/2019 Cancer Staging   Staging form: Breast, AJCC 8th Edition - Clinical stage from 03/22/2019: Stage IA (cT1c, cN0, cM0, G3, ER+, PR+, HER2+) - Signed by Lanny Callander, MD on 03/22/2019   03/31/2019 Genetic Testing   VUS in CTNNA1 called c.[id number] (Intronic) was identified on the [facility] Common Hereditary Cancers Panel. The Common Hereditary Cancers Panel offered by [facility] includes sequencing and/or deletion duplication testing of the following 47 genes: APC, ATM, AXIN2, BARD1, BMPR1A, BRCA1, BRCA2, BRIP1, CDH1, CDKN2A (p14ARF), CDKN2A (p16INK4a), CKD4, CHEK2, CTNNA1, DICER1, EPCAM (Deletion/duplication testing only), GREM1 (promoter region deletion/duplication testing only), KIT, MEN1, MLH1, MSH2, MSH3, MSH6, MUTYH, NBN, NF1, NHTL1, PALB2, PDGFRA, PMS2, POLD1, POLE, PTEN, RAD50, RAD51C, RAD51D, SDHB, SDHC, SDHD, SMAD4, SMARCA4.  STK11, TP53, TSC1, TSC2, and VHL.  The following genes were evaluated for sequence changes only: SDHA and HOXB13 c.251G>A variant only. The report date is 03/30/2019.    03/31/2019 Breast MRI   MRI breast 03/31/19  IMPRESSION: 1. 5 millimeter enhancing oval mass in the anteromedial aspect of the RIGHT breast warranting further evaluation. Given its anterior depth and visibility on noncontrast images, ultrasound is recommended to determine if there is a sonographic correlate. If no sonographic correlate is identified to guide biopsy, MRI biopsy of the RIGHT breast is recommended. 2. Known malignancy in the UPPER INNER QUADRANT of the LEFT breast measuring 1.0 centimeters. 3. 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   04/04/2019 Surgery   RADIOCATIVE SEED GUIDED LEFT BREAST LUMPECTOMY, LEFT AXILLARY SENTINEL LYMPH NODE BIOPSY and PAC placement  By Dr. Vanderbilt  04/04/19  04/04/2019 Pathology Results   DIAGNOSIS: 04/04/19  A. BREAST, LEFT, LUMPECTOMY:  -  Invasive ductal carcinoma, Nottingham grade 3 of 3, 1.3 cm  -  Ductal carcinoma in-situ, intermediate grade  -  Margins uninvolved by carcinoma (0.1 cm; posterior margin; see  comment)  -  Previous biopsy site changes present  -  See oncology table and comment below   B. BREAST, LEFT, ADDITIONAL MEDIAL MARGIN, EXCISION:  -  Fibrocystic changes  -  No residual carcinoma identified   C. LYMPH NODE, LEFT AXILLARY, SENTINEL, BIOPSY:  -   No carcinoma identified in one lymph node (0/1)    04/04/2019 Cancer Staging   Staging form: Breast, AJCC 8th Edition - Pathologic stage from 04/04/2019: Stage IA (pT1c, pN0, cM0, G3, ER+, PR+, HER2+) - Signed by Lanny Callander, MD on 04/21/2019   04/11/2019 Pathology Results   Diagnosis Breast, right, needle core biopsy, 2 o'clock - SMALL INTRADUCTAL PAPILLOMA. SEE NOTE - SMALL FIBROADENOMA - NEGATIVE FOR CARCINOMA   05/12/2019 - 06/10/2020 Chemotherapy   Adjuvant  Weekly Taxol  and Herceptin  for 12 weeks starting 05/12/19, switched to Abraxane  on 08/04/19 due to skin rash. Completed on 08/11/19.  Followed by maintenance Herceptin  q3weeks to complete 1 year treatment (from 05/12/19) starting 09/01/19-06/10/20   08/29/2019 - 09/26/2019 Radiation Therapy   Adjuvant radiation per Dr. Shannon    10/12/2019 -  Anti-estrogen oral therapy   Tamoxifen  20mg  daily starting 10/12/19   11/27/2019 Survivorship   SCP delivered by Lacie Burton, NP    03/15/2020 Mammogram   IMPRESSION: 1. Expected surgical changes at the lumpectomy site in the central posterior left breast.   2. No suspicious changes at the site of the biopsied papilloma in the lower inner retroareolar right breast (ribbon clip). Surgical excision is planned for this lesion.   3.  No evidence of malignancy in the bilateral breasts.   04/17/2020 Surgery   RIGHT BREAST LUMPECTOMY WITH RADIOACTIVE SEED LOCALIZATION and PAC removal by Dr Vanderbilt   FINAL MICROSCOPIC DIAGNOSIS:   A. BREAST, RIGHT, LUMPECTOMY:  - Biopsy clip.  Fibroadenoma.  Fibroadenomatoid change.  Fibrocystic  change.     08/2020 Surgery   Bilateral mastectomy with reconstruction   01/2021 Surgery   Bilateral salpingo-oophorectomy        REVIEW OF SYSTEMS:   Constitutional: Denies fevers, chills or abnormal weight loss Eyes: Denies blurriness of vision Ears, nose, mouth, throat, and face: Denies mucositis or sore throat Respiratory: Denies cough, dyspnea or wheezes Cardiovascular: Denies palpitation, chest discomfort or lower extremity swelling Gastrointestinal:  Denies nausea, heartburn or change in bowel habits Skin: Denies abnormal skin rashes Lymphatics: Denies new lymphadenopathy or easy bruising Neurological:Denies numbness, tingling or new weaknesses Behavioral/Psych: Mood is stable, no new changes  All other systems were reviewed with the patient and are negative.   VITALS:   Today's Vitals   06/02/24 0955  BP:  122/82  Pulse: 86  Resp: 18  Temp: (!) 97.3 F (36.3 C)  SpO2: 99%  Weight: 134 lb 3.2 oz (60.9 kg)  PainSc: 0-No pain   Body mass index is 24.55 kg/m.   Wt Readings from Last 3 Encounters:  06/02/24 134 lb 3.2 oz (60.9 kg)  12/03/23 137 lb 1.6 oz (62.2 kg)  05/24/23 142 lb (64.4 kg)    Body mass index is 24.55 kg/m.  Performance status (ECOG): 1 - Symptomatic but completely ambulatory  PHYSICAL EXAM:   GENERAL:alert, no distress and comfortable SKIN: skin color, texture, turgor are normal, no  rashes or significant lesions EYES: normal, Conjunctiva are pink and non-injected, sclera clear OROPHARYNX:no exudate, no erythema and lips, buccal mucosa, and tongue normal  NECK: supple, thyroid  normal size, non-tender, without nodularity LYMPH:  no palpable lymphadenopathy in the cervical, axillary or inguinal LUNGS: clear to auscultation and percussion with normal breathing effort HEART: regular rate & rhythm and no murmurs and no lower extremity edema ABDOMEN:abdomen soft, non-tender and normal bowel sounds Musculoskeletal:no cyanosis of digits and no clubbing  NEURO: alert & oriented x 3 with fluent speech, no focal motor/sensory deficits  LABORATORY DATA:  I have reviewed the data as listed    Component Value Date/Time   NA 140 06/02/2024 0923   K 5.1 06/02/2024 0923   CL 103 06/02/2024 0923   CO2 29 06/02/2024 0923   GLUCOSE 104 (H) 06/02/2024 0923   BUN 14 06/02/2024 0923   CREATININE 0.72 06/02/2024 0923   CALCIUM 10.0 06/02/2024 0923   PROT 7.5 06/02/2024 0923   ALBUMIN 4.7 06/02/2024 0923   AST 34 06/02/2024 0923   ALT 19 06/02/2024 0923   ALKPHOS 88 06/02/2024 0923   BILITOT 0.4 06/02/2024 0923   GFRNONAA >60 06/02/2024 0923   GFRAA >60 03/19/2020 1438     Lab Results  Component Value Date   WBC 7.9 06/02/2024   NEUTROABS 3.9 06/02/2024   HGB 13.8 06/02/2024   HCT 39.7 06/02/2024   MCV 88.0 06/02/2024   PLT 436 (H) 06/02/2024

## 2024-06-01 NOTE — Assessment & Plan Note (Addendum)
 Stage IA, p(T1cN0M0), ER+/PR+, HER2+, Grade III -Diagnosed in 03/2019. S/p left breast lumpectomy and SLNB on 04/04/19. 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 Shannon -She began tamoxifen  in 10/2019 and changed to anastrozole  in 09/2020. Due to worsening joint pain and decreased grip strength. Eventually, she changed to exemestane  in 05/2022 which she is tolerating well with only mild joint pain, mostly in the mornings.  -2022 she did undergo double mastectomy and reconstructive surgery.  No role for routine breast imaging due to bilateral mastectomy -2022 - she underwent bilateral oophorectomy.  -2025 - bone density test showing osteopenia. Start fosamax  70 mg weekly. Repeat in 2027.  - 06/02/2024 -patient stopped Fosamax  approximately 2 weeks ago.  Was causing her terrible bone and joint pain along with general feeling of malaise.  Reports doing well on Zometa  well taking chemotherapy and immunotherapy for breast cancer.  New treatment plan entered for Zometa .  Plan to start in 2 to 3 weeks and then every 6 months after that. - continue exemestane  daily. -Plan for labs and follow-up in 6 months, sooner if needed.

## 2024-06-02 ENCOUNTER — Encounter: Payer: Self-pay | Admitting: Nurse Practitioner

## 2024-06-02 ENCOUNTER — Inpatient Hospital Stay (HOSPITAL_BASED_OUTPATIENT_CLINIC_OR_DEPARTMENT_OTHER): Admitting: Nurse Practitioner

## 2024-06-02 ENCOUNTER — Inpatient Hospital Stay: Attending: Nurse Practitioner

## 2024-06-02 VITALS — BP 122/82 | HR 86 | Temp 97.3°F | Resp 18 | Wt 134.2 lb

## 2024-06-02 DIAGNOSIS — Z9221 Personal history of antineoplastic chemotherapy: Secondary | ICD-10-CM | POA: Diagnosis not present

## 2024-06-02 DIAGNOSIS — C50212 Malignant neoplasm of upper-inner quadrant of left female breast: Secondary | ICD-10-CM

## 2024-06-02 DIAGNOSIS — Z9013 Acquired absence of bilateral breasts and nipples: Secondary | ICD-10-CM | POA: Diagnosis not present

## 2024-06-02 DIAGNOSIS — Z170 Estrogen receptor positive status [ER+]: Secondary | ICD-10-CM | POA: Diagnosis not present

## 2024-06-02 DIAGNOSIS — Z79811 Long term (current) use of aromatase inhibitors: Secondary | ICD-10-CM | POA: Diagnosis not present

## 2024-06-02 DIAGNOSIS — M810 Age-related osteoporosis without current pathological fracture: Secondary | ICD-10-CM | POA: Diagnosis not present

## 2024-06-02 LAB — CBC WITH DIFFERENTIAL (CANCER CENTER ONLY)
Abs Immature Granulocytes: 0.02 K/uL (ref 0.00–0.07)
Basophils Absolute: 0.1 K/uL (ref 0.0–0.1)
Basophils Relative: 1 %
Eosinophils Absolute: 0.4 K/uL (ref 0.0–0.5)
Eosinophils Relative: 5 %
HCT: 39.7 % (ref 36.0–46.0)
Hemoglobin: 13.8 g/dL (ref 12.0–15.0)
Immature Granulocytes: 0 %
Lymphocytes Relative: 37 %
Lymphs Abs: 2.9 K/uL (ref 0.7–4.0)
MCH: 30.6 pg (ref 26.0–34.0)
MCHC: 34.8 g/dL (ref 30.0–36.0)
MCV: 88 fL (ref 80.0–100.0)
Monocytes Absolute: 0.6 K/uL (ref 0.1–1.0)
Monocytes Relative: 7 %
Neutro Abs: 3.9 K/uL (ref 1.7–7.7)
Neutrophils Relative %: 50 %
Platelet Count: 436 K/uL — ABNORMAL HIGH (ref 150–400)
RBC: 4.51 MIL/uL (ref 3.87–5.11)
RDW: 12.1 % (ref 11.5–15.5)
WBC Count: 7.9 K/uL (ref 4.0–10.5)
nRBC: 0 % (ref 0.0–0.2)

## 2024-06-02 LAB — CMP (CANCER CENTER ONLY)
ALT: 19 U/L (ref 0–44)
AST: 34 U/L (ref 15–41)
Albumin: 4.7 g/dL (ref 3.5–5.0)
Alkaline Phosphatase: 88 U/L (ref 38–126)
Anion gap: 9 (ref 5–15)
BUN: 14 mg/dL (ref 6–20)
CO2: 29 mmol/L (ref 22–32)
Calcium: 10 mg/dL (ref 8.9–10.3)
Chloride: 103 mmol/L (ref 98–111)
Creatinine: 0.72 mg/dL (ref 0.44–1.00)
GFR, Estimated: >60 mL/min (ref >60)
Glucose, Bld: 104 mg/dL — ABNORMAL HIGH (ref 70–99)
Potassium: 5.1 mmol/L (ref 3.5–5.1)
Sodium: 140 mmol/L (ref 135–145)
Total Bilirubin: 0.4 mg/dL (ref 0.0–1.2)
Total Protein: 7.5 g/dL (ref 6.5–8.1)

## 2024-07-26 ENCOUNTER — Other Ambulatory Visit: Payer: Self-pay

## 2024-07-27 ENCOUNTER — Other Ambulatory Visit: Payer: Self-pay

## 2024-07-27 NOTE — Progress Notes (Signed)
 Received fax from Roma Cheek DDS, PA for dental clearance for patient to be able to take Zometa . Original is placed in the to be scanned file.

## 2024-07-28 ENCOUNTER — Inpatient Hospital Stay

## 2024-08-05 ENCOUNTER — Telehealth: Payer: Self-pay | Admitting: Nurse Practitioner

## 2024-08-05 NOTE — Telephone Encounter (Signed)
 I left voicemail for patient regarding lab and Zometa  infusion appointments on 08/14/2024.

## 2024-08-07 ENCOUNTER — Inpatient Hospital Stay

## 2024-08-08 ENCOUNTER — Other Ambulatory Visit: Payer: Self-pay | Admitting: Nurse Practitioner

## 2024-08-08 ENCOUNTER — Other Ambulatory Visit: Payer: Self-pay | Admitting: Neurology

## 2024-08-08 DIAGNOSIS — G20A1 Parkinson's disease without dyskinesia, without mention of fluctuations: Secondary | ICD-10-CM

## 2024-08-14 ENCOUNTER — Inpatient Hospital Stay

## 2024-08-15 ENCOUNTER — Inpatient Hospital Stay

## 2024-08-15 ENCOUNTER — Inpatient Hospital Stay: Attending: Nurse Practitioner

## 2024-08-16 NOTE — Progress Notes (Unsigned)
 "   Assessment/Plan:   1.  Parkinsons Disease, diagnosed January 2024  -She will continue carbidopa /levodopa  25/100, 1 tablet 3 times per day.  She can take an extra if needed.  -discussed importance of exercise  -We discussed that it used to be thought that levodopa  would increase risk of melanoma but now it is believed that Parkinsons itself likely increases risk of melanoma. she is to get regular skin checks.  She is doing that.  She sees [facility]  2.  GAD  - On Wellbutrin  - Counseling referral was sent, but ultimately patient did not want to go to appointment.  3.  History of breast cancer, 2020  - Status post bilateral mastectomies with reconstruction.  Follows with oncology.  Doing well.  Subjective:   Chelsea Hobbs was seen today in follow up for Parkinsons disease.  My previous records were reviewed prior to todays visit as well as outside records available to me.  She remains on levodopa  3 times per day.  She has not had falls.  No lightheadedness or near syncope.  Last visit, referral was sent for her to counseling, but it does not appear that patient ever scheduled an appointment.  Current prescribed movement disorder medications: Carbidopa /levodopa  25/100, 1 tablet 3 times per day   PREVIOUS MEDICATIONS: Sinemet   ALLERGIES:   Allergies  Allergen Reactions   Gadolinium Derivatives Hives and Other (See Comments)    Pt started wheezing after contrast/ runny nose/ 1 hive on arm/ Dr Minus checked pt condition/ pt has an inhaler so used her inhaler and felt better/ no medication given / pt was instructed to wait at [facility] for 30 min to be reassessed by Dr.//jv    Latex Dermatitis   Tetracycline Hcl Diarrhea   Wound Dressing Adhesive Rash    Dermabond    CURRENT MEDICATIONS:  No outpatient medications have been marked as taking for the 08/18/24 encounter (Appointment) with Ruthanne Mcneish, [place] RAMAN, DO.     Objective:   PHYSICAL EXAMINATION:     VITALS:   There were no vitals filed for this visit.    GEN:  The patient appears stated age and is in NAD. HEENT:  Normocephalic, atraumatic.  The mucous membranes are moist. The superficial temporal arteries are without ropiness or tenderness. CV:  RRR Lungs:  CTAB Neck/HEME:  There are no carotid bruits bilaterally.  Neurological examination:  Orientation: The patient is alert and oriented x3. Cranial nerves: There is good facial symmetry without facial hypomimia. The speech is fluent and clear. Soft palate rises symmetrically and there is no tongue deviation. Hearing is intact to conversational tone. Sensation: Sensation is intact to light touch throughout Motor: Strength is at least antigravity x4.  Movement examination: Tone: There is normal tone in the UE/LE  Abnormal movements: normal Coordination:  There is no decremation with RAM's, with any form of RAMS, including alternating supination and pronation of the forearm, hand opening and closing, finger taps.  Slight decreased toe taps on the R Gait and Station: The patient has no difficulty arising out of a deep-seated chair without the use of the hands. The patient's stride length is normal.    I have reviewed and interpreted the following labs independently    Chemistry      Component Value Date/Time   NA 140 06/02/2024 0923   K 5.1 06/02/2024 0923   CL 103 06/02/2024 0923   CO2 29 06/02/2024 0923   BUN 14 06/02/2024 0923   CREATININE  0.72 06/02/2024 0923      Component Value Date/Time   CALCIUM 10.0 06/02/2024 0923   ALKPHOS 88 06/02/2024 0923   AST 34 06/02/2024 0923   ALT 19 06/02/2024 0923   BILITOT 0.4 06/02/2024 0923       Lab Results  Component Value Date   WBC 7.9 06/02/2024   HGB 13.8 06/02/2024   HCT 39.7 06/02/2024   MCV 88.0 06/02/2024   PLT 436 (H) 06/02/2024    Lab Results  Component Value Date   TSH 0.790 06/10/2020   Total time spent on today's visit was *** minutes, including  both face-to-face time and nonface-to-face time.  Time included that spent on review of records (prior notes available to me/labs/imaging if pertinent), discussing treatment and goals, answering patient's questions and coordinating care.    Cc:  Husain, Karrar, MD  "

## 2024-08-18 ENCOUNTER — Telehealth: Admitting: Neurology

## 2024-09-04 ENCOUNTER — Telehealth: Admitting: Neurology

## 2024-09-11 ENCOUNTER — Inpatient Hospital Stay

## 2024-09-11 ENCOUNTER — Inpatient Hospital Stay: Attending: Nurse Practitioner

## 2024-09-13 ENCOUNTER — Telehealth: Admitting: Neurology

## 2024-11-24 ENCOUNTER — Inpatient Hospital Stay

## 2024-11-24 ENCOUNTER — Inpatient Hospital Stay: Admitting: Nurse Practitioner
